# Patient Record
Sex: Female | Born: 1940 | Race: White | Hispanic: No | Smoking: Never smoker
Health system: Southern US, Community
[De-identification: ages and names within clinical notes are randomized; demographics above are authoritative.]

## PROBLEM LIST (undated history)

## (undated) DIAGNOSIS — Z5189 Encounter for other specified aftercare: Secondary | ICD-10-CM

## (undated) DIAGNOSIS — I10 Essential (primary) hypertension: Secondary | ICD-10-CM

## (undated) DIAGNOSIS — N390 Urinary tract infection, site not specified: Secondary | ICD-10-CM

## (undated) DIAGNOSIS — I509 Heart failure, unspecified: Secondary | ICD-10-CM

## (undated) DIAGNOSIS — E119 Type 2 diabetes mellitus without complications: Secondary | ICD-10-CM

## (undated) DIAGNOSIS — Z87442 Personal history of urinary calculi: Secondary | ICD-10-CM

## (undated) HISTORY — PX: TOE AMPUTATION: SHX809

## (undated) HISTORY — PX: EYE SURGERY: SHX253

---

## 2004-07-24 ENCOUNTER — Emergency Department (HOSPITAL_COMMUNITY): Admission: EM | Admit: 2004-07-24 | Discharge: 2004-07-24 | Payer: Self-pay | Admitting: Emergency Medicine

## 2012-01-15 ENCOUNTER — Encounter (HOSPITAL_COMMUNITY): Payer: Self-pay

## 2012-01-15 ENCOUNTER — Emergency Department (HOSPITAL_COMMUNITY): Payer: Medicare PPO

## 2012-01-15 ENCOUNTER — Inpatient Hospital Stay (HOSPITAL_COMMUNITY)
Admission: EM | Admit: 2012-01-15 | Discharge: 2012-01-17 | DRG: 669 | Disposition: A | Discharge status: Home / Self Care | Payer: Medicare PPO | Attending: Internal Medicine | Admitting: Internal Medicine

## 2012-01-15 ENCOUNTER — Inpatient Hospital Stay (HOSPITAL_COMMUNITY): Payer: Medicare PPO

## 2012-01-15 DIAGNOSIS — L97519 Non-pressure chronic ulcer of other part of right foot with unspecified severity: Secondary | ICD-10-CM

## 2012-01-15 DIAGNOSIS — N3289 Other specified disorders of bladder: Secondary | ICD-10-CM | POA: Diagnosis present

## 2012-01-15 DIAGNOSIS — R0602 Shortness of breath: Secondary | ICD-10-CM | POA: Diagnosis present

## 2012-01-15 DIAGNOSIS — E7800 Pure hypercholesterolemia, unspecified: Secondary | ICD-10-CM | POA: Diagnosis present

## 2012-01-15 DIAGNOSIS — N189 Chronic kidney disease, unspecified: Secondary | ICD-10-CM | POA: Diagnosis present

## 2012-01-15 DIAGNOSIS — E11621 Type 2 diabetes mellitus with foot ulcer: Secondary | ICD-10-CM | POA: Diagnosis present

## 2012-01-15 DIAGNOSIS — Z79899 Other long term (current) drug therapy: Secondary | ICD-10-CM

## 2012-01-15 DIAGNOSIS — L97509 Non-pressure chronic ulcer of other part of unspecified foot with unspecified severity: Secondary | ICD-10-CM | POA: Diagnosis present

## 2012-01-15 DIAGNOSIS — J811 Chronic pulmonary edema: Secondary | ICD-10-CM | POA: Diagnosis present

## 2012-01-15 DIAGNOSIS — N200 Calculus of kidney: Principal | ICD-10-CM | POA: Diagnosis present

## 2012-01-15 DIAGNOSIS — I10 Essential (primary) hypertension: Secondary | ICD-10-CM | POA: Diagnosis present

## 2012-01-15 DIAGNOSIS — I509 Heart failure, unspecified: Secondary | ICD-10-CM

## 2012-01-15 DIAGNOSIS — N1330 Unspecified hydronephrosis: Secondary | ICD-10-CM | POA: Diagnosis present

## 2012-01-15 DIAGNOSIS — E785 Hyperlipidemia, unspecified: Secondary | ICD-10-CM | POA: Diagnosis present

## 2012-01-15 DIAGNOSIS — E119 Type 2 diabetes mellitus without complications: Secondary | ICD-10-CM | POA: Diagnosis present

## 2012-01-15 DIAGNOSIS — N179 Acute kidney failure, unspecified: Secondary | ICD-10-CM | POA: Diagnosis present

## 2012-01-15 DIAGNOSIS — S98119A Complete traumatic amputation of unspecified great toe, initial encounter: Secondary | ICD-10-CM

## 2012-01-15 HISTORY — DX: Encounter for other specified aftercare: Z51.89

## 2012-01-15 HISTORY — DX: Type 2 diabetes mellitus without complications: E11.9

## 2012-01-15 HISTORY — DX: Essential (primary) hypertension: I10

## 2012-01-15 LAB — URINALYSIS, ROUTINE W REFLEX MICROSCOPIC
Leukocytes, UA: NEGATIVE
Nitrite: NEGATIVE
Protein, ur: 30 mg/dL — AB
Protein, ur: NEGATIVE mg/dL
Specific Gravity, Urine: 1.007 (ref 1.005–1.030)
Urobilinogen, UA: 0.2 mg/dL (ref 0.0–1.0)
Urobilinogen, UA: 0.2 mg/dL (ref 0.0–1.0)

## 2012-01-15 LAB — PRO B NATRIURETIC PEPTIDE: Pro B Natriuretic peptide (BNP): 6662 pg/mL — ABNORMAL HIGH (ref 0–125)

## 2012-01-15 LAB — URINE MICROSCOPIC-ADD ON

## 2012-01-15 LAB — TROPONIN I
Troponin I: <0.3 ng/mL (ref <0.30)
Troponin I: <0.3 ng/mL (ref <0.30)

## 2012-01-15 LAB — PROTIME-INR: INR: 1.25 (ref 0.00–1.49)

## 2012-01-15 LAB — CBC
Platelets: 97 10*3/uL — ABNORMAL LOW (ref 150–400)
RDW: 13.4 % (ref 11.5–15.5)
WBC: 18.3 10*3/uL — ABNORMAL HIGH (ref 4.0–10.5)

## 2012-01-15 LAB — COMPREHENSIVE METABOLIC PANEL
Calcium: 8.3 mg/dL — ABNORMAL LOW (ref 8.4–10.5)
Chloride: 100 mEq/L (ref 96–112)
GFR calc Af Amer: 22 mL/min — ABNORMAL LOW (ref >90)
GFR calc non Af Amer: 19 mL/min — ABNORMAL LOW (ref >90)
Potassium: 4.2 mEq/L (ref 3.5–5.1)
Sodium: 132 mEq/L — ABNORMAL LOW (ref 135–145)
Total Protein: 6 g/dL (ref 6.0–8.3)

## 2012-01-15 MED ORDER — OXYCODONE-ACETAMINOPHEN 5-325 MG PO TABS
1.0000 | ORAL_TABLET | Freq: Once | ORAL | Status: AC
  Administered 2012-01-15: 1 via ORAL
  Filled 2012-01-15: qty 1

## 2012-01-15 MED ORDER — CIPROFLOXACIN HCL 500 MG PO TABS
500.0000 mg | ORAL_TABLET | Freq: Two times a day (BID) | ORAL | Status: DC
Start: 2012-01-15 — End: 2012-01-17
  Administered 2012-01-15 – 2012-01-17 (×3): 500 mg via ORAL
  Filled 2012-01-15 (×6): qty 1

## 2012-01-15 MED ORDER — ALBUTEROL SULFATE (5 MG/ML) 0.5% IN NEBU: 2.5000 mg | INHALATION_SOLUTION | Freq: Four times a day (QID) | RESPIRATORY_TRACT | Status: DC | PRN

## 2012-01-15 MED ORDER — VERAPAMIL HCL ER 200 MG PO CP24
1.0000 | ORAL_CAPSULE | Freq: Every day | ORAL | Status: DC
  Administered 2012-01-15: 200 mg via ORAL
  Filled 2012-01-15 (×3): qty 1

## 2012-01-15 MED ORDER — ATORVASTATIN CALCIUM 40 MG PO TABS
40.0000 mg | ORAL_TABLET | Freq: Every day | ORAL | Status: DC
  Administered 2012-01-15 – 2012-01-17 (×2): 40 mg via ORAL
  Filled 2012-01-15 (×3): qty 1

## 2012-01-15 MED ORDER — INSULIN ASPART 100 UNIT/ML ~~LOC~~ SOLN
0.0000 [IU] | Freq: Three times a day (TID) | SUBCUTANEOUS | Status: DC
  Administered 2012-01-16: 2 [IU] via SUBCUTANEOUS
  Administered 2012-01-17: 1 [IU] via SUBCUTANEOUS

## 2012-01-15 MED ORDER — OXYCODONE-ACETAMINOPHEN 5-325 MG PO TABS: 1.0000 | ORAL_TABLET | ORAL | Status: DC | PRN

## 2012-01-15 MED ORDER — IPRATROPIUM BROMIDE 0.02 % IN SOLN: 0.5000 mg | Freq: Four times a day (QID) | RESPIRATORY_TRACT | Status: DC | PRN

## 2012-01-15 MED ORDER — ONDANSETRON HCL 4 MG/2ML IJ SOLN: 4.0000 mg | Freq: Four times a day (QID) | INTRAMUSCULAR | Status: DC | PRN

## 2012-01-15 MED ORDER — ACETAMINOPHEN 650 MG RE SUPP: 650.0000 mg | Freq: Four times a day (QID) | RECTAL | Status: DC | PRN

## 2012-01-15 MED ORDER — SODIUM CHLORIDE 0.9 % IV SOLN
INTRAVENOUS | Status: DC
  Administered 2012-01-16: 11:00:00 via INTRAVENOUS
  Administered 2012-01-16: 1000 mL via INTRAVENOUS

## 2012-01-15 MED ORDER — ASPIRIN EC 81 MG PO TBEC
81.0000 mg | DELAYED_RELEASE_TABLET | Freq: Every day | ORAL | Status: DC
  Administered 2012-01-15: 81 mg via ORAL
  Filled 2012-01-15 (×2): qty 1

## 2012-01-15 MED ORDER — FUROSEMIDE 10 MG/ML IJ SOLN
40.0000 mg | Freq: Once | INTRAMUSCULAR | Status: AC
  Administered 2012-01-15: 40 mg via INTRAVENOUS
  Filled 2012-01-15: qty 4

## 2012-01-15 MED ORDER — ACETAMINOPHEN 325 MG PO TABS: 650.0000 mg | ORAL_TABLET | Freq: Four times a day (QID) | ORAL | Status: DC | PRN

## 2012-01-15 MED ORDER — TAMSULOSIN HCL 0.4 MG PO CAPS
0.4000 mg | ORAL_CAPSULE | Freq: Every day | ORAL | Status: DC
  Administered 2012-01-15 – 2012-01-17 (×2): 0.4 mg via ORAL
  Filled 2012-01-15 (×3): qty 1

## 2012-01-15 MED ORDER — NITROGLYCERIN 2 % TD OINT
1.0000 [in_us] | TOPICAL_OINTMENT | Freq: Once | TRANSDERMAL | Status: AC
  Administered 2012-01-15: 1 [in_us] via TOPICAL
  Filled 2012-01-15: qty 1

## 2012-01-15 NOTE — H&P (Signed)
Internal Medicine Teaching Service Attending Note Date: 01/15/2012  Patient name: Ruth Douglas  Medical record number: [id number]  Date of birth: Jan 02, 1941   Chief Complaint: Pain in my belly, trouble breathing. Marland Kitchen History of Present Illness The patient, Ruth Douglas, is a 71 y.o. year old female who comes in with the chief complaint of pain abdomen and trouble breathing since the past 3-4 days. She has been treated at [facility] for kidney stone, given fluids and discharged on Cipro, however she did not feel better.  Past Medical History   has a past medical history of Renal disorder; Blood transfusion without reported diagnosis; Diabetes mellitus without complication; and Hypertension.  She has had kidney stone 30 years ago.  Medications  Reviewed  Family History family history is not on file.  Social History  reports that she has never smoked. She does not have any smokeless tobacco history on file. She reports that she does not drink alcohol or use illicit drugs.  Review of Systems Positive for - abdominal pain, fever (subjective), shortness of breath, chills, dysuria, frequency.  Negative for - chest pain, palpitations, nausea, vomiting.  Vital Signs: Filed Vitals:   01/15/12 2050  BP: 154/70  Pulse: 86  Temp: 98 F (36.7 C)  Resp: 20    Physical Exam: General:  Well-developed, well-nourished, in no acute distress; alert, appropriate and cooperative throughout examination.  HEENT: Normocephalic, atraumatic.   PERRLA, EOMI, No signs of anemia or jaundice.   Nares clear without discharge or hypertrophy of turbinates   Moist mucus membranes, Oropharynx nonerythematous, no exudate appreciated.   Neck: No deformities, masses, or tenderness noted.Supple, No carotid Bruits, no JVD.  Lungs:  Diffuse fine crackles present till mid lungs bilaterally.   Heart: RRR. S1 and S2 normal without gallop, murmur, or rubs appreciated  Abdomen:  BS normoactive. Soft,  Nondistended, mildly-tender LLQ .  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 Right foot with ulcer with some purulent discharge in plantar area directly under stump    Neurologic: A&O X3, CN II - XII are grossly intact. Motor strength is 5/5 in all extremities, Sensations intact to light touch.  Skin: No visible rashes or erythema   Lab results: CMP     Component Value Date/Time   NA 132* 01/15/2012 1129   K 4.2 01/15/2012 1129   CL 100 01/15/2012 1129   CO2 19 01/15/2012 1129   GLUCOSE 116* 01/15/2012 1129   BUN 57* 01/15/2012 1129   CREATININE 2.42* 01/15/2012 1129   CALCIUM 8.3* 01/15/2012 1129   PROT 6.0 01/15/2012 1129   ALBUMIN 2.3* 01/15/2012 1129   AST 35 01/15/2012 1129   ALT 23 01/15/2012 1129   ALKPHOS 112 01/15/2012 1129   BILITOT 0.4 01/15/2012 1129   GFRNONAA 19* 01/15/2012 1129   GFRAA 22* 01/15/2012 1129   CBC    Component Value Date/Time   WBC 18.3* 01/15/2012 1129   RBC 3.06* 01/15/2012 1129   HGB 8.3* 01/15/2012 1129   HCT 24.9* 01/15/2012 1129   PLT 97* 01/15/2012 1129   MCV 81.4 01/15/2012 1129   MCH 27.1 01/15/2012 1129   MCHC 33.3 01/15/2012 1129   RDW 13.4 01/15/2012 1129   Imaging results:  Ct Abdomen Pelvis Wo Contrast  01/15/2012  *RADIOLOGY REPORT*  Clinical Data: Left flank pain, kidney stone, dysuria  CT ABDOMEN AND PELVIS WITHOUT CONTRAST  Technique:  Multidetector CT imaging of the abdomen and pelvis was performed following  the standard protocol without intravenous contrast.  Comparison: [facility] CT abdomen pelvis dated 01/11/2012  Findings: Small left and trace right pleural effusions.  Mild hepatic steatosis.  Unenhanced spleen, pancreas, and adrenal glands are within normal limits.  Mildly distended gallbladder possible layering sludge (series 2/image 22).  No associated inflammatory changes by CT.  9 mm nonobstructing left lower pole renal calculus, unchanged.  Moderate left hydronephrosis, mildly increased, with associated perinephric/periureteral stranding.  Right kidney is notable for upper pole probable cysts (series 2/image 18).  No evidence of bowel obstruction.  Normal appendix.  Atherosclerotic calcifications of the abdominal aorta and branch vessels.  Small volume pelvic ascites.  Uterus and bilateral ovaries are unremarkable.  8 mm calculus in the distal left ureter (coronal image 60).  Nondependent gas in the bladder (series 2/image 70), likely related to recent intervention.  2.4 x 1.8 cm hypodense lesion in the left perineum (series 2/image 83), favored to reflect a Bartholin gland cyst.  Mild degenerative changes of the visualized thoracolumbar spine.  IMPRESSION: 8 mm distal left ureteral calculus with moderate left hydronephrosis, increased.  This likely corresponds to the UPJ calculus on recent prior CT.  Stable 9 mm left lower pole renal calculus.  Additional stable ancillary findings as above.   Original Report Authenticated By: Charline Bills, M.D.    Dg Chest 1 View  01/15/2012  *RADIOLOGY REPORT*  Clinical Data: Chest pain and shortness of breath.  CHEST - 1 VIEW  Comparison: No priors.  Findings: Lung volumes are normal.  No definite consolidative airspace disease.  Cephalization of the pulmonary vasculature, without frank pulmonary edema.  Heart size is borderline to mildly enlarged.  Atherosclerosis in the thoracic aorta.  IMPRESSION: 1.  Borderline to mildly enlarged cardiac silhouette with pulmonary venous congestion, but no frank pulmonary edema at this time. Findings may suggest some mild congestive heart failure.   Original Report Authenticated By: Trudie Reed, M.D.    Assessment and Plan:  60F with Left renal calculus with moderate left hydronephrosis, causing acute kidney injury and possible volume overload, not septic. Patient has been already seen by Urology consultant Dr Laverle Patter, and plan is to do lithotripsy tomorrow at  [facility]. Care would be taken over from there by Dr. Freida Busman. I agree with this plan.  Talked to Dr. Clyde Lundborg over the phone to discuss the plan.  For the night, I am in agreement with Dr. Garald Braver and Dr. Evorn Gong plan of care.  Thanks, Aletta Edouard, MD 11/17/201311:02 PM

## 2012-01-15 NOTE — Progress Notes (Signed)
71 yo woman recently hospitalized for kidney stone at [facility] has shortness of breath.  Lab workup shows elevated BNP, and chest x-ray shows pulmonary vascular congestion.  Plan to admit to treat for CHF.

## 2012-01-15 NOTE — ED Provider Notes (Signed)
History     CSN: [id number]  Arrival date & time 01/15/12  7829   First MD Initiated Contact with Patient 01/15/12 1036      Chief Complaint  Patient presents with  . Flank Pain    (Consider location/radiation/quality/duration/timing/severity/associated sxs/prior treatment) Patient is a 71 y.o. female presenting with flank pain and shortness of breath. The history is provided by the patient, a relative and the spouse. No language interpreter was used.  Flank Pain This is a recurrent problem. The current episode started in the past 7 days. The problem occurs constantly. The problem has been gradually worsening. Associated symptoms include abdominal pain. Pertinent negatives include no coughing, fever, headaches, nausea, vomiting or weakness.  Shortness of Breath  The current episode started 5 to 7 days ago. The onset was gradual. Associated symptoms include shortness of breath. Pertinent negatives include no fever, no cough and no wheezing.  71 yo female with c/o L flank pain and SOB.  Patient was released from [facility] hospital yesterday on home O2 with dx  kidney stone with pending lithotripsy in 3 days.  She is on cipro for ? Infection or prophylactically.  Family states that "the pain medication has made her face swell" Her urologist is Dr. Paulino Door in [place].  Her pcp is dr Remigio Eisenmenger in [place].  She is on cipro.   pmh of diabetes, hypertension, blood transfursion and kidney stone.    Past Medical History  Diagnosis Date  . Renal disorder   . Blood transfusion without reported diagnosis   . Diabetes mellitus without complication   . Hypertension     Past Surgical History  Procedure Date  . Toe amputation   . Cesarean section     No family history on file.  History  Substance Use Topics  . Smoking status: Never Smoker   . Smokeless tobacco: Not on file  . Alcohol Use: No    OB History    Grav Para Term Preterm Abortions TAB SAB Ect Mult Living                   Review of Systems  Constitutional: Negative.  Negative for fever.  HENT: Negative.   Eyes: Negative.   Respiratory: Positive for shortness of breath. Negative for cough, chest tightness and wheezing.   Cardiovascular: Negative.   Gastrointestinal: Positive for abdominal pain. Negative for nausea, vomiting, diarrhea, constipation, blood in stool and abdominal distention.  Genitourinary: Positive for flank pain. Negative for dysuria, urgency, frequency, hematuria, vaginal bleeding, vaginal discharge and difficulty urinating.  Musculoskeletal: Negative.   Skin: Negative.   Neurological: Negative.  Negative for dizziness, weakness, light-headedness and headaches.  Psychiatric/Behavioral: Negative.   All other systems reviewed and are negative.    Allergies  Review of patient's allergies indicates no known allergies.  Home Medications   Current Outpatient Rx  Name  Route  Sig  Dispense  Refill  . ATORVASTATIN CALCIUM 40 MG PO TABS   Oral   Take 40 mg by mouth daily.         Marland Kitchen CIPROFLOXACIN HCL 500 MG PO TABS   Oral   Take 500 mg by mouth 2 (two) times daily.         Marland Kitchen METFORMIN HCL 1000 MG PO TABS   Oral   Take 1,000 mg by mouth 2 (two) times daily with a meal.         . OXYCODONE-ACETAMINOPHEN 5-325 MG PO TABS   Oral   Take 1-2  tablets by mouth every 4 (four) hours as needed. For pain         . VERAPAMIL HCL ER 200 MG PO CP24   Oral   Take 1 capsule by mouth daily.           BP 161/62  Pulse 86  Temp 98.3 F (36.8 C) (Oral)  Resp 20  SpO2 88%  Physical Exam  Nursing note and vitals reviewed. Constitutional: She is oriented to person, place, and time. She appears well-developed and well-nourished.  HENT:  Head: Normocephalic and atraumatic.  Eyes: Conjunctivae normal and EOM are normal. Pupils are equal, round, and reactive to light.  Neck: Normal range of motion. Neck supple.  Cardiovascular: Normal rate.   Pulmonary/Chest: Effort normal. She  has rales.  Abdominal: Soft. Bowel sounds are normal. She exhibits no distension and no mass. There is tenderness. There is no rebound and no guarding.       LUQ abdomen  Genitourinary:       No cva tenderness  Musculoskeletal: Normal range of motion. She exhibits no edema and no tenderness.  Neurological: She is alert and oriented to person, place, and time. She has normal reflexes.  Skin: Skin is warm and dry.  Psychiatric: She has a normal mood and affect.    ED Course  Procedures (including critical care time)   Labs Reviewed  URINALYSIS, ROUTINE W REFLEX MICROSCOPIC  CBC  COMPREHENSIVE METABOLIC PANEL  PRO B NATRIURETIC PEPTIDE   Ct Abdomen Pelvis Wo Contrast  01/15/2012  *RADIOLOGY REPORT*  Clinical Data: Left flank pain, kidney stone, dysuria  CT ABDOMEN AND PELVIS WITHOUT CONTRAST  Technique:  Multidetector CT imaging of the abdomen and pelvis was performed following the standard protocol without intravenous contrast.  Comparison: [facility] CT abdomen pelvis dated 01/11/2012  Findings: Small left and trace right pleural effusions.  Mild hepatic steatosis.  Unenhanced spleen, pancreas, and adrenal glands are within normal limits.  Mildly distended gallbladder possible layering sludge (series 2/image 22).  No associated inflammatory changes by CT.  9 mm nonobstructing left lower pole renal calculus, unchanged. Moderate left hydronephrosis, mildly increased, with associated perinephric/periureteral stranding.  Right kidney is notable for upper pole probable cysts (series 2/image 18).  No evidence of bowel obstruction.  Normal appendix.  Atherosclerotic calcifications of the abdominal aorta and branch vessels.  Small volume pelvic ascites.  Uterus and bilateral ovaries are unremarkable.  8 mm calculus in the distal left ureter (coronal image 60).  Nondependent gas in the bladder (series 2/image 70), likely related to recent intervention.  2.4 x 1.8 cm hypodense lesion in the left perineum  (series 2/image 83), favored to reflect a Bartholin gland cyst.  Mild degenerative changes of the visualized thoracolumbar spine.  IMPRESSION: 8 mm distal left ureteral calculus with moderate left hydronephrosis, increased.  This likely corresponds to the UPJ calculus on recent prior CT.  Stable 9 mm left lower pole renal calculus.  Additional stable ancillary findings as above.   Original Report Authenticated By: Charline Bills, M.D.    Dg Chest 1 View  01/15/2012  *RADIOLOGY REPORT*  Clinical Data: Chest pain and shortness of breath.  CHEST - 1 VIEW  Comparison: No priors.  Findings: Lung volumes are normal.  No definite consolidative airspace disease.  Cephalization of the pulmonary vasculature, without frank pulmonary edema.  Heart size is borderline to mildly enlarged.  Atherosclerosis in the thoracic aorta.  IMPRESSION: 1.  Borderline to mildly enlarged cardiac silhouette with pulmonary venous congestion,  but no frank pulmonary edema at this time. Findings may suggest some mild congestive heart failure.   Original Report Authenticated By: Trudie Reed, M.D.      No diagnosis found.    MDM  71 yo female with CHF bnp [id number].  DC from [facility] hospital yesterday with home O2 and kidney stones on cipro.  Records pending from [facility] being faxed.  Today CT shows 9mm stone in  Lower pole renal calculus with moderate l hydronephrosis.  8mm stone  in distal L ureter as well.  Patient will be admitted to IM unassigned for hypoxia, chf and kidney stones.  CT and chest reviewed by myself.  Shared visit with Dr Ignacia Palma.  Labs Reviewed  CBC - Abnormal; Notable for the following:    WBC 18.3 (*)     RBC 3.06 (*)     Hemoglobin 8.3 (*)     HCT 24.9 (*)     Platelets 97 (*)  PLATELET COUNT CONFIRMED BY SMEAR   All other components within normal limits  COMPREHENSIVE METABOLIC PANEL - Abnormal; Notable for the following:    Sodium 132 (*)     Glucose, Bld 116 (*)     BUN 57 (*)     Creatinine,  Ser 2.42 (*)     Calcium 8.3 (*)     Albumin 2.3 (*)     GFR calc non Af Amer 19 (*)     GFR calc Af Amer 22 (*)     All other components within normal limits  PRO B NATRIURETIC PEPTIDE - Abnormal; Notable for the following:    Pro B Natriuretic peptide (BNP) 6662.0 (*)     All other components within normal limits  URINALYSIS, ROUTINE W REFLEX MICROSCOPIC  URINALYSIS, ROUTINE W REFLEX MICROSCOPIC         Remi Haggard, NP 01/15/12 1323

## 2012-01-15 NOTE — Progress Notes (Signed)
Contacted by resident service, 71 yo F with obstructing renal calculi felt to be causing AKI, fluid overload, being transferred to [facility] at the request of urology who evaluated patient already for planned lithotripsy procedure to be performed tomorrow.  Have accepted patient and notified our flow manager of the pending transfer.

## 2012-01-15 NOTE — Progress Notes (Signed)
Patient ID: Ruth Douglas, female   DOB: Jun 29, 1940, 72 y.o.   MRN: [id number]  Urology Consult   Physician requesting consult: Dr. Dalphine Handing  Reason for consult: Ureteral calculus  History of Present Illness: Ruth Douglas is a 71 y.o. who was in the ED at [facility] Wednesday and was found to have a left ureteral calculus. She was admitted to the hospital in [place] and stayed there until yesterday when she was discharged.  She does have a history of kidney stones and her primary urologist is Dr. Jerre Simon in [place] and she was originally scheduled to undergo SWL on Wednesday of this week.  She presented to the [facility] ED today with complaints of worsening left flank pain that she describes as severe and significant shortness of breath.  She has been admitted by Internal Medicine for further treatment and evaluation of her pulmonary situation.  In addition, she has been noted to have a Cr of 2.4.  A repeat CT was performed today and demonstrated a 6.5 mm left distal ureteral calculus.  She also has a 9 mm non-obstructing left lower pole renal calculus. She has had nausea and vomiting.  She denies fever.   Past Medical History  Diagnosis Date      . Hypercholesterolemia   . Diabetes mellitus without complication   . Hypertension     Past Surgical History  Procedure Date  . Toe amputation   . Cesarean section      Current Hospital Medications: Scheduled Meds:   . [COMPLETED] furosemide  40 mg Intravenous Once  . [COMPLETED] nitroGLYCERIN  1 inch Topical Once  . [COMPLETED] oxyCODONE-acetaminophen  1 tablet Oral Once   Continuous Infusions:  PRN Meds:.    Medication List     As of 01/15/2012  5:30 PM            atorvastatin 40 MG tablet   Commonly known as: LIPITOR   Take 40 mg by mouth daily.                  metFORMIN 1000 MG tablet   Commonly known as: GLUCOPHAGE   Take 1,000 mg by mouth 2 (two) times daily with a meal.                  Verapamil HCl  CR 200 MG Cp24   Take 1 capsule by mouth daily.          Allergies: No Known Allergies  No family history on file.  Social History:  reports that she has never smoked. She does not have any smokeless tobacco history on file. She reports that she does not drink alcohol or use illicit drugs.  ROS: A complete review of systems was performed.  All systems are negative except for pertinent findings as noted.  Physical Exam:  Vital signs in last 24 hours: Temp:  [98.3 F (36.8 C)] 98.3 F (36.8 C) (11/17 1003) Pulse Rate:  [81-87] 87  (11/17 1430) Resp:  [18-20] 20  (11/17 1430) BP: (155-167)/(62-74) 155/68 mmHg (11/17 1430) SpO2:  [88 %-100 %] 100 % (11/17 1430) FiO2 (%):  [2 %] 2 % (11/17 1215) General:  Alert and oriented, No acute distress HEENT: Normocephalic, atraumatic Neck: No JVD or lymphadenopathy Cardiovascular: Regular rate and rhythm Lungs: Clear bilaterally Abdomen: Soft, nontender, nondistended, no abdominal masses Back: No CVA tenderness Extremities: No edema Neurologic: Grossly intact  Laboratory Data:   Basename 01/15/12 1129  WBC 18.3*  HGB 8.3*  HCT 24.9*  PLT 97*     Basename 01/15/12 1129  NA 132*  K 4.2  CL 100  GLUCOSE 116*  BUN 57*  CALCIUM 8.3*  CREATININE 2.42*     Results for orders placed during the hospital encounter of 01/15/12 (from the past 24 hour(s))  CBC     Status: Abnormal   Collection Time   01/15/12 11:29 AM      Component Value Range   WBC 18.3 (*) 4.0 - 10.5 K/uL   RBC 3.06 (*) 3.87 - 5.11 MIL/uL   Hemoglobin 8.3 (*) 12.0 - 15.0 g/dL   HCT 36.6 (*) 44.0 - 34.7 %   MCV 81.4  78.0 - 100.0 fL   MCH 27.1  26.0 - 34.0 pg   MCHC 33.3  30.0 - 36.0 g/dL   RDW 42.5  95.6 - 38.7 %   Platelets 97 (*) 150 - 400 K/uL  COMPREHENSIVE METABOLIC PANEL     Status: Abnormal   Collection Time   01/15/12 11:29 AM      Component Value Range   Sodium 132 (*) 135 - 145 mEq/L   Potassium 4.2  3.5 - 5.1 mEq/L   Chloride 100  96 -  112 mEq/L   CO2 19  19 - 32 mEq/L   Glucose, Bld 116 (*) 70 - 99 mg/dL   BUN 57 (*) 6 - 23 mg/dL   Creatinine, Ser 5.64 (*) 0.50 - 1.10 mg/dL   Calcium 8.3 (*) 8.4 - 10.5 mg/dL   Total Protein 6.0  6.0 - 8.3 g/dL   Albumin 2.3 (*) 3.5 - 5.2 g/dL   AST 35  0 - 37 U/L   ALT 23  0 - 35 U/L   Alkaline Phosphatase 112  39 - 117 U/L   Total Bilirubin 0.4  0.3 - 1.2 mg/dL   GFR calc non Af Amer 19 (*) >90 mL/min   GFR calc Af Amer 22 (*) >90 mL/min  PRO B NATRIURETIC PEPTIDE     Status: Abnormal   Collection Time   01/15/12 11:42 AM      Component Value Range   Pro B Natriuretic peptide (BNP) 6662.0 (*) 0 - 125 pg/mL  URINALYSIS, ROUTINE W REFLEX MICROSCOPIC     Status: Abnormal   Collection Time   01/15/12  1:22 PM      Component Value Range   Color, Urine YELLOW  YELLOW   APPearance CLEAR  CLEAR   Specific Gravity, Urine 1.010  1.005 - 1.030   pH 5.5  5.0 - 8.0   Glucose, UA NEGATIVE  NEGATIVE mg/dL   Hgb urine dipstick LARGE (*) NEGATIVE   Bilirubin Urine NEGATIVE  NEGATIVE   Ketones, ur NEGATIVE  NEGATIVE mg/dL   Protein, ur 30 (*) NEGATIVE mg/dL   Urobilinogen, UA 0.2  0.0 - 1.0 mg/dL   Nitrite NEGATIVE  NEGATIVE   Leukocytes, UA NEGATIVE  NEGATIVE  URINE MICROSCOPIC-ADD ON     Status: Abnormal   Collection Time   01/15/12  1:22 PM      Component Value Range   Squamous Epithelial / LPF FEW (*) RARE   WBC, UA 11-20  <3 WBC/hpf   RBC / HPF 7-10  <3 RBC/hpf   No results found for this or any previous visit (from the past 240 hour(s)).  Renal Function:  Basename 01/15/12 1129  CREATININE 2.42*   CrCl is unknown because there is no height on file for the current visit.  Radiologic Imaging: Ct Abdomen  Pelvis Wo Contrast  01/15/2012  *RADIOLOGY REPORT*  Clinical Data: Left flank pain, kidney stone, dysuria  CT ABDOMEN AND PELVIS WITHOUT CONTRAST  Technique:  Multidetector CT imaging of the abdomen and pelvis was performed following the standard protocol without  intravenous contrast.  Comparison: [facility] CT abdomen pelvis dated 01/11/2012  Findings: Small left and trace right pleural effusions.  Mild hepatic steatosis.  Unenhanced spleen, pancreas, and adrenal glands are within normal limits.  Mildly distended gallbladder possible layering sludge (series 2/image 22).  No associated inflammatory changes by CT.  9 mm nonobstructing left lower pole renal calculus, unchanged. Moderate left hydronephrosis, mildly increased, with associated perinephric/periureteral stranding.  Right kidney is notable for upper pole probable cysts (series 2/image 18).  No evidence of bowel obstruction.  Normal appendix.  Atherosclerotic calcifications of the abdominal aorta and branch vessels.  Small volume pelvic ascites.  Uterus and bilateral ovaries are unremarkable.  8 mm calculus in the distal left ureter (coronal image 60).  Nondependent gas in the bladder (series 2/image 70), likely related to recent intervention.  2.4 x 1.8 cm hypodense lesion in the left perineum (series 2/image 83), favored to reflect a Bartholin gland cyst.  Mild degenerative changes of the visualized thoracolumbar spine.  IMPRESSION: 8 mm distal left ureteral calculus with moderate left hydronephrosis, increased.  This likely corresponds to the UPJ calculus on recent prior CT.  Stable 9 mm left lower pole renal calculus.  Additional stable ancillary findings as above.   Original Report Authenticated By: Charline Bills, M.D.    Dg Chest 1 View  01/15/2012  *RADIOLOGY REPORT*  Clinical Data: Chest pain and shortness of breath.  CHEST - 1 VIEW  Comparison: No priors.  Findings: Lung volumes are normal.  No definite consolidative airspace disease.  Cephalization of the pulmonary vasculature, without frank pulmonary edema.  Heart size is borderline to mildly enlarged.  Atherosclerosis in the thoracic aorta.  IMPRESSION: 1.  Borderline to mildly enlarged cardiac silhouette with pulmonary venous congestion, but no  frank pulmonary edema at this time. Findings may suggest some mild congestive heart failure.   Original Report Authenticated By: Trudie Reed, M.D.      I independently reviewed the above imaging studies.  Impression/Assessment:  Left ureteral calculus with acute kidney injury  Plan:  1) Strain urine and administer IV pain meds. Alpha blocker therapy for medical expulsion therapy. NPO after midnight. 2) Plan for OR tomorrow considering persistent pain and elevated Cr (likely related to dehydration but may have a component of CKD with history of diabetes).  I recommended proceeding left ureteroscopic laser lithotripsy and ureteral stent placement. I discussed the potential benefits and risks of the procedure, side effects of the proposed treatment, the likelihood of the patient achieving the goals of the procedure, and any potential problems that might occur during the procedure or recuperation.  3) Please call me if she passes her stone.  She will be added to OR schedule tomorrow at [facility] and will be arranged to be carelinked for procedure. 4) Monitor renal function.    Maryfrances Portugal,LES 01/15/2012, 5:05 PM  Moody Bruins. MD

## 2012-01-15 NOTE — ED Notes (Signed)
Patient transferred to CDU report called to [facility]

## 2012-01-15 NOTE — ED Notes (Signed)
Pt was seen at [facility] from [place] and d/c yesterday for obstructing kidney stone.  Was set to have lithotripsy this Wed.  Pt with generalized edema starting last night.

## 2012-01-15 NOTE — H&P (Signed)
Hospital Admission Note Date: 01/15/2012  Patient name: Ruth Douglas Medical record number: [id number] Date of birth: [birth year] Age: 71 y.o. Gender: female PCP: No primary provider on file.  Medical Service: Internal Medicine Teaching Service  Attending physician:  Dr. Michael Boston    1st Contact: Dr. Garald Braver   Pager:[phone] 2nd Contact: Dr. Clyde Lundborg    Pager:[phone] After 5 pm or weekends: 1st Contact:      Pager: [phone] 2nd Contact:      Pager: [phone]  Chief Complaint: Left flank pain, shortness of breath, dysuria   History of Present Illness: Ruth Douglas is a 71 year old woman with PMH of HTN, DM2, and hyperlipidemia who comes in to the [facility] ED for further evaluation of left kidney stone and new onset shortness of breath. She states that on Wednesday she had severe left flank pain, was seen at the [facility] and found to have a left ureteral calculus. She was admitted there, given IVF, and was discharged yesterday with Cipro 500mg  BID, oxycodone for pain, and home O2. Today she describes feeling increasedly short of breath with "puffiness: over her arms and face. She also complains of a burning sensation with urination for 3-4 days now.    She denies fever but has had nausea, vomiting, chills, and malaise.   She lives in [place], [place] but often seeks care in [place], [place]. Her PCP is Dr. Farris Has, in [place]. She is a former Producer, television/film/video. She has never had PFTs done and denies shortness of breath until recently. She never smoked.   Meds: No current facility-administered medications on file prior to encounter.   Current Outpatient Prescriptions on File Prior to Encounter  Medication Sig Dispense Refill  . atorvastatin (LIPITOR) 40 MG tablet Take 40 mg by mouth daily.      . Verapamil HCl CR 200 MG CP24 Take 1 capsule by mouth daily.       Allergies: Allergies as of 01/15/2012  . (No Known Allergies)   Past Medical History  Diagnosis Date  . Renal disorder   . Blood  transfusion without reported diagnosis   . Diabetes mellitus without complication   . Hypertension    Past Surgical History  Procedure Date  . Toe amputation   . Cesarean section    History reviewed. No pertinent family history. History   Social History  . Marital Status: Married    Spouse Name: N/A    Number of Children: N/A  . Years of Education: N/A   Occupational History  . Not on file.   Social History Main Topics  . Smoking status: Never Smoker   . Smokeless tobacco: Not on file  . Alcohol Use: No  . Drug Use: No  . Sexually Active:    Other Topics Concern  . Not on file   Social History Narrative  . No narrative on file    Review of Systems: Pertinent items are noted in HPI.  Physical Exam: Blood pressure 159/69, pulse 86, temperature 98.4 F (36.9 C), temperature source Oral, resp. rate 20, height 5\' 3"  (1.6 m), weight 165 lb 12.6 oz (75.2 kg), SpO2 98.00%. BP 159/69  Pulse 86  Temp 98.4 F (36.9 C) (Oral)  Resp 20  Ht 5\' 3"  (1.6 m)  Wt 165 lb 12.6 oz (75.2 kg)  BMI 29.37 kg/m2  SpO2 98%  General Appearance:    Alert, cooperative, no distress, appears stated age  Head:    Normocephalic, without obvious abnormality, atraumatic  Eyes:  PERRL, conjunctiva/corneas clear, EOM's intact,      Nose:   Nares normal, septum midline, mucosa normal, no drainage     Throat:   Lips, mucosa, and tongue normal;  Neck:   Supple, symmetrical, trachea midline, no adenopathy;      Back:     Symmetric, no curvature, ROM normal, no CVA tenderness  Lungs:     Bibasilar crackles, respirations unlabored  Chest Wall:    No tenderness or deformity   Heart:    Regular rate and rhythm, S1 and S2 normal, no murmur, rub   or gallop     Abdomen:     Tenderness in LLQ, soft, bowel sounds active all four quadrants, no masses, no organomegaly        Extremities:   Extremities normal, atraumatic, no cyanosis trace pedal edema. 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  Right foot with ulcer with minimum purulent discharge in plantar area directly beneath the 1st metatarsal stump  Pulses:   2+ and symmetric all extremities  Skin:   Skin color, texture, turgor normal, no rashes or lesions     Neurologic:   CNII-XII intact, normal strength, sensation and reflexes    throughout    Lab results: Basic Metabolic Panel:  Basename 01/15/12 1129  NA 132*  K 4.2  CL 100  CO2 19  GLUCOSE 116*  BUN 57*  CREATININE 2.42*  CALCIUM 8.3*  MG --  PHOS --   Liver Function Tests:  Basename 01/15/12 1129  AST 35  ALT 23  ALKPHOS 112  BILITOT 0.4  PROT 6.0  ALBUMIN 2.3*   CBC:  Basename 01/15/12 1129  WBC 18.3*  NEUTROABS --  HGB 8.3*  HCT 24.9*  MCV 81.4  PLT 97*   Cardiac Enzymes:  Basename 01/15/12 1548  CKTOTAL --  CKMB --  CKMBINDEX --  TROPONINI <0.30   BNP:  Basename 01/15/12 1142  PROBNP 6662.0*   CBG:  Basename 01/15/12 1655  GLUCAP 100*     Urinalysis:  Basename 01/15/12 1322  COLORURINE YELLOW  LABSPEC 1.010  PHURINE 5.5  GLUCOSEU NEGATIVE  HGBUR LARGE*  BILIRUBINUR NEGATIVE  KETONESUR NEGATIVE  PROTEINUR 30*  UROBILINOGEN 0.2  NITRITE NEGATIVE  LEUKOCYTESUR NEGATIVE    Imaging results:  Ct Abdomen Pelvis Wo Contrast  01/15/2012  *RADIOLOGY REPORT*  Clinical Data: Left flank pain, kidney stone, dysuria  CT ABDOMEN AND PELVIS WITHOUT CONTRAST  Technique:  Multidetector CT imaging of the abdomen and pelvis was performed following the standard protocol without intravenous contrast.  Comparison: [place] CT abdomen pelvis dated 01/11/2012  Findings: Small left and trace right pleural effusions.  Mild hepatic steatosis.  Unenhanced spleen, pancreas, and adrenal glands are within normal limits.  Mildly distended gallbladder possible layering sludge (series 2/image 22).  No associated inflammatory changes by CT.  9 mm nonobstructing left lower pole renal calculus, unchanged. Moderate left hydronephrosis, mildly  increased, with associated perinephric/periureteral stranding.  Right kidney is notable for upper pole probable cysts (series 2/image 18).  No evidence of bowel obstruction.  Normal appendix.  Atherosclerotic calcifications of the abdominal aorta and branch vessels.  Small volume pelvic ascites.  Uterus and bilateral ovaries are unremarkable.  8 mm calculus in the distal left ureter (coronal image 60).  Nondependent gas in the bladder (series 2/image 70), likely related to recent intervention.  2.4 x 1.8 cm hypodense lesion in the left perineum (series 2/image 83), favored to reflect a Bartholin gland cyst.  Mild degenerative changes  of the visualized thoracolumbar spine.  IMPRESSION: 8 mm distal left ureteral calculus with moderate left hydronephrosis, increased.  This likely corresponds to the UPJ calculus on recent prior CT.  Stable 9 mm left lower pole renal calculus.  Additional stable ancillary findings as above.   Original Report Authenticated By: Charline Bills, M.D.    Dg Chest 1 View  01/15/2012  *RADIOLOGY REPORT*  Clinical Data: Chest pain and shortness of breath.  CHEST - 1 VIEW  Comparison: No priors.  Findings: Lung volumes are normal.  No definite consolidative airspace disease.  Cephalization of the pulmonary vasculature, without frank pulmonary edema.  Heart size is borderline to mildly enlarged.  Atherosclerosis in the thoracic aorta.  IMPRESSION: 1.  Borderline to mildly enlarged cardiac silhouette with pulmonary venous congestion, but no frank pulmonary edema at this time. Findings may suggest some mild congestive heart failure.   Original Report Authenticated By: Trudie Reed, M.D.     Other results: EKG: Normal sinus rhythm, no axis deviation, no ST changes  Assessment & Plan by Problem: 71 year old lady with DM2, HTN, now with Left renal calculus, AKI, and right foot ulcer  Left Renal Calculus with acute kidney injury: Her baseline creatinine is unknown but it is certainly  elevated on admission to 2.42. This is most likely explained by her renal calculi. Her CT abdomen shows 8 mm distal left ureteral calculus with moderate left hydronephrosis, increased (likely corresponds to the UPJ calculus on recent prior CT) and stable 9 mm left lower pole renal calculus. She is currently not septic, however, with no tachycardia, tachpenia, or fever. She does have leukocytosis of 18.3, however. Urology has been consulted. Per Dr. Laverle Patter patient will likely undergo left ureteroscopic laser lithotripsy and ureteral stent placement.  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 She is currently saturating at 98% on 2L.  -Continue O2 PRN, wean off as tolerated -DuoNebs q 6hr PRN for SOB/wheezing.  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 Her wound looks suspicious for osteomyelitis. Given her leukocytosis and malaise, osteomyelitis of the right foot is also a possible active problem to consider.  -Right foot Xray ordered, will f/u - Will call Orthopedic surgery pending results of plain film  DM2. She states that she last saw her PCP, Dr. Farris Has, in January this year. She takes metformin BID at home and assures compliance. She also takes aspirin 81 mg daily. -Will check Hgb A1C -Hold metformin in setting of AKI -SSI (sensitive) -Continue ASA  HTN: On verapamil 200mg  daily at home. Will continue this medication.   Hyperlipidemia. On  Atorvastatin 40mg  daily at home. Will continue this  medication. Will check lipid panel.   DVT prophylaxis: SCDs   Dispo: Disposition is deferred at this time, awaiting improvement of current medical problems. Anticipated discharge in approximately 2-3day(s).   The patient does have current PCP (Dr. Farris Has), therefore will not be require OPC follow-up after discharge.   The patient does not have transportation  limitations that hinder transportation to clinic appointments.  SignedKy Barban 01/15/2012, 7:34 PM

## 2012-01-15 NOTE — ED Provider Notes (Signed)
Date: 01/15/2012  Rate: 85  Rhythm: normal sinus rhythm  QRS Axis: normal  Intervals: normal  ST/T Wave abnormalities: normal  Conduction Disutrbances:none  Narrative Interpretation: Normal EKG  Old EKG Reviewed: none available    Carleene Cooper III, MD 01/15/12 1544

## 2012-01-16 ENCOUNTER — Inpatient Hospital Stay: Admit: 2012-01-16 | Payer: Self-pay | Admitting: Urology

## 2012-01-16 ENCOUNTER — Encounter (HOSPITAL_COMMUNITY): Payer: Self-pay | Admitting: *Deleted

## 2012-01-16 ENCOUNTER — Inpatient Hospital Stay (HOSPITAL_COMMUNITY): Payer: Medicare PPO | Admitting: *Deleted

## 2012-01-16 ENCOUNTER — Encounter (HOSPITAL_COMMUNITY)
Admission: EM | Disposition: A | Discharge status: Home / Self Care | Payer: Self-pay | Source: Home / Self Care | Attending: Internal Medicine

## 2012-01-16 DIAGNOSIS — R0602 Shortness of breath: Secondary | ICD-10-CM

## 2012-01-16 DIAGNOSIS — E119 Type 2 diabetes mellitus without complications: Secondary | ICD-10-CM

## 2012-01-16 DIAGNOSIS — N179 Acute kidney failure, unspecified: Secondary | ICD-10-CM

## 2012-01-16 DIAGNOSIS — I509 Heart failure, unspecified: Secondary | ICD-10-CM

## 2012-01-16 HISTORY — PX: CYSTOSCOPY WITH STENT PLACEMENT: SHX5790

## 2012-01-16 HISTORY — PX: HOLMIUM LASER APPLICATION: SHX5852

## 2012-01-16 LAB — BASIC METABOLIC PANEL
BUN: 60 mg/dL — ABNORMAL HIGH (ref 6–23)
Chloride: 99 mEq/L (ref 96–112)
Creatinine, Ser: 2.34 mg/dL — ABNORMAL HIGH (ref 0.50–1.10)
GFR calc Af Amer: 23 mL/min — ABNORMAL LOW (ref >90)
GFR calc non Af Amer: 20 mL/min — ABNORMAL LOW (ref >90)
Glucose, Bld: 100 mg/dL — ABNORMAL HIGH (ref 70–99)

## 2012-01-16 LAB — LIPID PANEL
HDL: 20 mg/dL — ABNORMAL LOW (ref >39)
LDL Cholesterol: 78 mg/dL (ref 0–99)
Total CHOL/HDL Ratio: 6.3 RATIO

## 2012-01-16 LAB — GLUCOSE, CAPILLARY
Glucose-Capillary: 109 mg/dL — ABNORMAL HIGH (ref 70–99)
Glucose-Capillary: 95 mg/dL (ref 70–99)

## 2012-01-16 LAB — CBC
HCT: 23.8 % — ABNORMAL LOW (ref 36.0–46.0)
Hemoglobin: 8 g/dL — ABNORMAL LOW (ref 12.0–15.0)
MCH: 27.4 pg (ref 26.0–34.0)
MCHC: 33.6 g/dL (ref 30.0–36.0)
RDW: 13.4 % (ref 11.5–15.5)

## 2012-01-16 LAB — PROTIME-INR: INR: 1.32 (ref 0.00–1.49)

## 2012-01-16 LAB — HEMOGLOBIN A1C: Hgb A1c MFr Bld: 7.6 % — ABNORMAL HIGH (ref <5.7)

## 2012-01-16 SURGERY — CYSTOSCOPY, WITH STENT INSERTION
Anesthesia: General | Laterality: Left | Wound class: Clean Contaminated

## 2012-01-16 MED ORDER — SODIUM CHLORIDE 0.9 % IR SOLN
Status: DC | PRN
  Administered 2012-01-16: 4000 mL via INTRAVESICAL

## 2012-01-16 MED ORDER — CIPROFLOXACIN IN D5W 200 MG/100ML IV SOLN
200.0000 mg | INTRAVENOUS | Status: DC
  Filled 2012-01-16: qty 100

## 2012-01-16 MED ORDER — MEPERIDINE HCL 50 MG/ML IJ SOLN: 6.2500 mg | INTRAMUSCULAR | Status: DC | PRN

## 2012-01-16 MED ORDER — PROPOFOL 10 MG/ML IV EMUL
INTRAVENOUS | Status: DC | PRN
  Administered 2012-01-16: 200 mg via INTRAVENOUS

## 2012-01-16 MED ORDER — HYDROMORPHONE HCL PF 1 MG/ML IJ SOLN: 0.2500 mg | INTRAMUSCULAR | Status: DC | PRN

## 2012-01-16 MED ORDER — IOHEXOL 300 MG/ML  SOLN
INTRAMUSCULAR | Status: AC
  Filled 2012-01-16: qty 1

## 2012-01-16 MED ORDER — OXYCODONE HCL 5 MG/5ML PO SOLN: 5.0000 mg | Freq: Once | ORAL | Status: AC | PRN

## 2012-01-16 MED ORDER — STERILE WATER FOR IRRIGATION IR SOLN
Status: DC | PRN
  Administered 2012-01-16: 1000 mL via INTRAVESICAL

## 2012-01-16 MED ORDER — LIDOCAINE HCL (CARDIAC) 20 MG/ML IV SOLN
INTRAVENOUS | Status: DC | PRN
  Administered 2012-01-16: 75 mg via INTRAVENOUS

## 2012-01-16 MED ORDER — IOHEXOL 300 MG/ML  SOLN
INTRAMUSCULAR | Status: DC | PRN
  Administered 2012-01-16: 10 mL

## 2012-01-16 MED ORDER — ACETAMINOPHEN 10 MG/ML IV SOLN
1000.0000 mg | Freq: Once | INTRAVENOUS | Status: AC | PRN
  Filled 2012-01-16: qty 100

## 2012-01-16 MED ORDER — PROMETHAZINE HCL 25 MG/ML IJ SOLN: 6.2500 mg | INTRAMUSCULAR | Status: DC | PRN

## 2012-01-16 MED ORDER — OXYCODONE HCL 5 MG PO TABS: 5.0000 mg | ORAL_TABLET | Freq: Once | ORAL | Status: AC | PRN

## 2012-01-16 MED ORDER — ONDANSETRON HCL 4 MG/2ML IJ SOLN
INTRAMUSCULAR | Status: DC | PRN
  Administered 2012-01-16 (×2): 2 mg via INTRAVENOUS

## 2012-01-16 MED ORDER — CIPROFLOXACIN IN D5W 400 MG/200ML IV SOLN
INTRAVENOUS | Status: DC | PRN
  Administered 2012-01-16: 200 mg via INTRAVENOUS

## 2012-01-16 MED ORDER — EPHEDRINE SULFATE 50 MG/ML IJ SOLN
INTRAMUSCULAR | Status: DC | PRN
  Administered 2012-01-16: 2.5 mg via INTRAVENOUS

## 2012-01-16 SURGICAL SUPPLY — 21 items
ADAPTER CATH URET PLST 4-6FR (CATHETERS) ×2 IMPLANT
ADPR CATH URET STRL DISP 4-6FR (CATHETERS) ×1
BAG URO CATCHER STRL LF (DRAPE) ×2 IMPLANT
BASKET ZERO TIP NITINOL 2.4FR (BASKET) ×1 IMPLANT
BSKT STON RTRVL ZERO TP 2.4FR (BASKET) ×1
CATH CLEAR GEL 3F BACKSTOP (CATHETERS) ×1 IMPLANT
CATH INTERMIT  6FR 70CM (CATHETERS) ×2 IMPLANT
CLOTH BEACON ORANGE TIMEOUT ST (SAFETY) ×2 IMPLANT
DRAPE CAMERA CLOSED 9X96 (DRAPES) ×2 IMPLANT
ELECT REM PT RETURN 9FT ADLT (ELECTROSURGICAL) ×2
ELECTRODE REM PT RTRN 9FT ADLT (ELECTROSURGICAL) IMPLANT
GLOVE BIOGEL M STRL SZ7.5 (GLOVE) ×2 IMPLANT
GOWN PREVENTION PLUS XLARGE (GOWN DISPOSABLE) ×2 IMPLANT
GOWN STRL NON-REIN LRG LVL3 (GOWN DISPOSABLE) ×4 IMPLANT
GUIDEWIRE ANG ZIPWIRE 038X150 (WIRE) IMPLANT
GUIDEWIRE STR DUAL SENSOR (WIRE) ×2 IMPLANT
LASER FIBER DISP (UROLOGICAL SUPPLIES) ×1 IMPLANT
MANIFOLD NEPTUNE II (INSTRUMENTS) ×2 IMPLANT
PACK CYSTO (CUSTOM PROCEDURE TRAY) ×2 IMPLANT
STENT CONTOUR 6FRX24X.038 (STENTS) ×1 IMPLANT
TUBING CONNECTING 10 (TUBING) ×2 IMPLANT

## 2012-01-16 NOTE — Preoperative (Signed)
Beta Blockers   Reason not to administer Beta Blockers:Not Applicable 

## 2012-01-16 NOTE — Anesthesia Postprocedure Evaluation (Signed)
Anesthesia Post Note  Patient: Ruth Douglas  Procedure(s) Performed: Procedure(s) (LRB): CYSTOSCOPY WITH STENT PLACEMENT (Left) HOLMIUM LASER APPLICATION (Left)  Anesthesia type: General  Patient location: PACU  Post pain: Pain level controlled  Post assessment: Post-op Vital signs reviewed  Last Vitals: BP 149/56  Pulse 81  Temp 36.3 C (Oral)  Resp 16  Ht 5\' 3"  (1.6 m)  Wt 160 lb 6.4 oz (72.757 kg)  BMI 28.41 kg/m2  SpO2 99%  Post vital signs: Reviewed  Level of consciousness: sedated  Complications: No apparent anesthesia complications

## 2012-01-16 NOTE — ED Provider Notes (Signed)
Medical screening examination/treatment/procedure(s) were conducted as a shared visit with non-physician practitioner(s) and myself.  I personally evaluated the patient during the encounter 71 yo woman recently hospitalized for kidney stone at [facility] has shortness of breath. Lab workup shows elevated BNP, and chest x-ray shows pulmonary vascular congestion. Plan to admit to treat for CHF.        Carleene Cooper III, MD 01/16/12 [phone]

## 2012-01-16 NOTE — Progress Notes (Signed)
She was transferred to [facility] today by the request of Urology for lithotripsy planned for this afternoon. She left before I could reassess but she has been accepted by Lyda Perone, DO as of last night and will be followed by his team at [facility] upon her arrival. Transfer orders were placed last night.

## 2012-01-16 NOTE — Op Note (Signed)
Preoperative diagnosis: Left ureteral calculus, acute kidney injury  Postoperative diagnosis: Left ureteral calculus, acute kidney injury, Bladder lesion  Procedure:  1. Cystoscopy 2. Left ureteroscopy and stone removal 3. Ureteroscopic laser lithotripsy 4. Left ureteral stent placement (6 x 24) 5. Left retrograde pyelography with interpretation 6. Bladder biopsy and fulguration  Surgeon: Moody Bruins. M.D.  Anesthesia: General  Complications: None  Intraoperative findings: Left retrograde pyelography demonstrated a filling defect within the distal left ureter consistent with the patient's known calculus without other abnormalities.  EBL: Minimal  Specimens: 1. Left ureteral calculus  Disposition of specimens: [facility] for stone analysis  Indication: Ruth Douglas is a 71 y.o. year old patient with urolithiasis. After reviewing the management options for treatment, the patient elected to proceed with the above surgical procedure(s). We have discussed the potential benefits and risks of the procedure, side effects of the proposed treatment, the likelihood of the patient achieving the goals of the procedure, and any potential problems that might occur during the procedure or recuperation. Informed consent has been obtained.  Description of procedure:  The patient was taken to the operating room and general anesthesia was induced.  The patient was placed in the dorsal lithotomy position, prepped and draped in the usual sterile fashion, and preoperative antibiotics were administered. A preoperative time-out was performed.   Cystourethroscopy was performed.  The patient's urethra was examined and was normal. The bladder was then systematically examined in its entirety. There was no evidence for any bladder tumors, stones, but there was severe erythema of the right lateral wall and trigone with some mucinous appearing lesions at the trigone.  These findings  were not particularly suspicious for malignancy but were not expected based on the patient's ureteral stone.    Attention then turned to the left ureteral orifice and a ureteral catheter was used to intubate the ureteral orifice.  Omnipaque contrast was injected through the ureteral catheter and a retrograde pyelogram was performed with findings as dictated above.  A 0.38 sensor guidewire was then advanced up the left ureter into the renal pelvis under fluoroscopic guidance. The 6 Fr semirigid ureteroscope was then advanced into the ureter next to the guidewire and the calculus was identified. Backstop was injected into the ureter above the level of the stone to prevent stone migration.   The stone was then fragmented with the 365 micron holmium laser fiber on a setting of 0.6 J and frequency of 6 Hz.   All stones were then removed from the ureter with a zero tip nitinol basket.  Reinspection of the ureter revealed no remaining visible stones or fragments.   The wire was then backloaded through the cystoscope and a ureteral stent was advance over the wire using Seldinger technique.  The stent was positioned appropriately under fluoroscopic and cystoscopic guidance.  The wire was then removed with an adequate stent curl noted in the renal pelvis as well as in the bladder.  Attention then turned to the bladder abnormalities.  Cold cup biopsy forceps were used to biopsy the abnormal tissue at the trigone and right lateral wall with bugbee electrode used to fulgurate these areas.  This resulted in excellent hemostasis.  The bladder was then emptied and the procedure ended.  The patient appeared to tolerate the procedure well and without complications.  The patient was able to be awakened and transferred to the recovery unit in satisfactory condition.

## 2012-01-16 NOTE — Transfer of Care (Signed)
Immediate Anesthesia Transfer of Care Note  Patient: Ruth Douglas  Procedure(s) Performed: Procedure(s) (LRB) with comments: CYSTOSCOPY WITH STENT PLACEMENT (Left) HOLMIUM LASER APPLICATION (Left)  Patient Location: PACU  Anesthesia Type:General  Level of Consciousness: awake, patient cooperative, lethargic and responds to stimulation  Airway & Oxygen Therapy: Patient Spontanous Breathing and Patient connected to face mask oxygen  Post-op Assessment: Report given to PACU RN, Post -op Vital signs reviewed and stable and Patient moving all extremities  Post vital signs: Reviewed and stable  Complications: No apparent anesthesia complications

## 2012-01-16 NOTE — Progress Notes (Signed)
Pt being transferred to [facility] room 1335 at this time for surgery scheduled later this afternoon.  Report has been given to [facility].  Also reported off to receiving RN who denies any questions or concerns at this time.  Pt leaving unit via ambulance transport and appears in no acute distress. Nino Glow RN

## 2012-01-16 NOTE — Progress Notes (Signed)
  Echocardiogram 2D Echocardiogram has been performed.  Ruth Douglas 01/16/2012, 2:42 PM 

## 2012-01-16 NOTE — Progress Notes (Signed)
Patient ID: Ruth Douglas, female   DOB: 1940/06/26, 71 y.o.   MRN: [id number]  [facility] PROGRESS NOTE  Ruth Douglas VFI:[id number] DOB: 16-Sep-1940 DOA: 01/15/2012 PCP: No primary provider on file.  Brief narrative: Ruth Douglas is a 71 year old woman with PMH of HTN, DM2, and hyperlipidemia who comes in to the [facility] ED for further evaluation of left kidney stone and new onset shortness of breath. She states that 4 days prior to admission she had severe left flank pain, was seen at the [facility] and found to have a left ureteral calculus. She was admitted there, given IVF, and was discharged one day prior to this admission with Cipro 500mg  BID, oxycodone for pain, and home O2. The day of admission she describes feeling increasedly short of breath with "puffiness: over her arms and face. She also complained of a burning sensation with urination for 3-4 days now. Per urologist request pt ws transferred to [facility] for further management. [facility] will assume care.   Principal Problem:  *Renal calculus, left  baseline creatinine is unknown but it is certainly elevated on admission to 2.42. This is most likely explained by her renal calculi.    CT abdomen shows 8 mm distal left ureteral calculus with moderate left hydronephrosis, increased (likely corresponds to the UPJ calculus on recent prior CT) and stable 9 mm left lower pole renal calculus.    Follow upon urology recommendations  Cipro 500mg  PO BID  Active Problems:  DM2 (diabetes mellitus, type 2)  Follow up on A1C  Hold metformin in the setting of acute renal failure   Right foot ulcer  Plan on MRI for further evaluation  AKI (acute kidney injury)  Obtain BMP in AM  Shortness of breath  Secondary to mild pulmonary edema  Monitor oxygen saturations  Oxygen as needed  Consultants:  Urology  Procedures/Studies: 01/16/2012 1. Cystoscopy 2. Left ureteroscopy and stone removal 3. Ureteroscopic laser  lithotripsy 4. Left ureteral stent placement (6 x 24) 5. Left retrograde pyelography with interpretation 6. Bladder biopsy and fulguration  Ct Abdomen Pelvis Wo Contrast 01/15/2012  8 mm distal left ureteral calculus with moderate left hydronephrosis, increased.  This likely corresponds to the UPJ calculus on recent prior CT.  Stable 9 mm left lower pole renal calculus.   Dg Chest 1 View 01/15/2012   1.  Borderline to mildly enlarged cardiac silhouette with pulmonary venous congestion, but no frank pulmonary edema at this time. Findings may suggest some mild congestive heart failure.  Dg Foot Complete Right 01/16/2012   Interval amputations of the first and second rays. Osseous demineralization. No radiographic evidence of osteomyelitis though if this remains a clinical concern, consider MR imaging.   Antibiotics:  Ciprofloxacin 11/17 -->  Code Status: Full Family Communication: Pt at bedside Disposition Plan: Home when medically stable  HPI/Subjective: No events overnight.   Objective: Filed Vitals:   01/16/12 0900 01/16/12 1239 01/16/12 1300 01/16/12 1319  BP: 143/48 137/54 147/56 149/56  Pulse: 77 78 80 81  Temp: 98.2 F (36.8 C) 97.4 F (36.3 C) 97.4 F (36.3 C) 97.4 F (36.3 C)  TempSrc:      Resp: 16 18 18 16   Height:      Weight:      SpO2: 97% 100% 100% 99%    Intake/Output Summary (Last 24 hours) at 01/16/12 1719 Last data filed at 01/16/12 1235  Gross per 24 hour  Intake    560 ml  Output   2000 ml  Net  -1440 ml    Exam:   General:  Pt is alert, follows commands appropriately, not in acute distress  Cardiovascular: Regular rate and rhythm, S1/S2, no murmurs, no rubs, no gallops  Respiratory: Clear to auscultation bilaterally, no wheezing, no crackles, no rhonchi  Abdomen: Soft, non tender, non distended, bowel sounds present, no guarding  Extremities: No edema, pulses DP and PT palpable bilaterally  Neuro: Grossly nonfocal  Data  Reviewed: Basic Metabolic Panel:  Lab 01/16/12 1610 01/15/12 1129  NA 134* 132*  K 3.9 4.2  CL 99 100  CO2 22 19  GLUCOSE 100* 116*  BUN 60* 57*  CREATININE 2.34* 2.42*  CALCIUM 8.2* 8.3*  MG -- --  PHOS -- --   Liver Function Tests:  Lab 01/15/12 1129  AST 35  ALT 23  ALKPHOS 112  BILITOT 0.4  PROT 6.0  ALBUMIN 2.3*   CBC:  Lab 01/16/12 0655 01/15/12 1129  WBC 13.6* 18.3*  NEUTROABS -- --  HGB 8.0* 8.3*  HCT 23.8* 24.9*  MCV 81.5 81.4  PLT 94* 97*   Cardiac Enzymes:  Lab 01/16/12 0655 01/15/12 2122 01/15/12 1548  CKTOTAL -- -- --  CKMB -- -- --  CKMBINDEX -- -- --  TROPONINI <0.30 <0.30 <0.30   CBG:  Lab 01/16/12 1644 01/16/12 1246 01/16/12 0613 01/15/12 2156 01/15/12 1655  GLUCAP 169* 127* 95 109* 100*    Recent Results (from the past 240 hour(s))  CULTURE, BLOOD (ROUTINE X 2)     Status: Normal (Preliminary result)   Collection Time   01/15/12  4:00 PM      Component Value Range Status Comment   Specimen Description BLOOD LEFT HAND   Final    Special Requests BOTTLES DRAWN AEROBIC AND ANAEROBIC 10CC   Final    Culture  Setup Time 01/15/2012 23:40   Final    Culture     Final    Value:        BLOOD CULTURE RECEIVED NO GROWTH TO DATE CULTURE WILL BE HELD FOR 5 DAYS BEFORE ISSUING A FINAL NEGATIVE REPORT   Report Status PENDING   Incomplete   SURGICAL PCR SCREEN     Status: Normal   Collection Time   01/16/12  7:54 AM      Component Value Range Status Comment   MRSA, PCR NEGATIVE  NEGATIVE Final    Staphylococcus aureus NEGATIVE  NEGATIVE Final      Scheduled Meds:   . atorvastatin  40 mg Oral Daily  . ciprofloxacin  500 mg Oral BID  . insulin aspart  0-9 Units Subcutaneous TID WC  . Tamsulosin HCl  0.4 mg Oral Daily  . Verapamil HCl CR  1 capsule Oral Daily   Continuous Infusions:   . sodium chloride       Debbora Presto, MD  [facility] Pager [phone]  If 7PM-7AM, please contact night-coverage [email or web address] Password  [email or web address] 01/16/2012, 5:19 PM   LOS: 1 day

## 2012-01-16 NOTE — Anesthesia Preprocedure Evaluation (Addendum)
Anesthesia Evaluation  Patient identified by MRN, date of birth, ID band Patient awake    Reviewed: Allergy & Precautions, H&P , NPO status , Patient's Chart, lab work & pertinent test results  Airway Mallampati: II TM Distance: >3 FB Neck ROM: Full    Dental  (+) Dental Advisory Given, Edentulous Upper and Edentulous Lower   Pulmonary shortness of breath and with exertion,  breath sounds clear to auscultation  Pulmonary exam normal       Cardiovascular hypertension, Pt. on medications Rhythm:Regular Rate:Normal     Neuro/Psych negative neurological ROS  negative psych ROS   GI/Hepatic negative GI ROS, Neg liver ROS,   Endo/Other  diabetes, Well Controlled, Type 2  Renal/GU CRF and Renal InsufficiencyRenal disease     Musculoskeletal negative musculoskeletal ROS (+)   Abdominal   Peds  Hematology negative hematology ROS (+)   Anesthesia Other Findings   Reproductive/Obstetrics                         Anesthesia Physical Anesthesia Plan  ASA: III  Anesthesia Plan: General   Post-op Pain Management:    Induction: Intravenous  Airway Management Planned: LMA  Additional Equipment:   Intra-op Plan:   Post-operative Plan: Extubation in OR  Informed Consent: I have reviewed the patients History and Physical, chart, labs and discussed the procedure including the risks, benefits and alternatives for the proposed anesthesia with the patient or authorized representative who has indicated his/her understanding and acceptance.   Dental advisory given  Plan Discussed with: CRNA  Anesthesia Plan Comments:         Anesthesia Quick Evaluation

## 2012-01-16 NOTE — Progress Notes (Signed)
Patient ID: Ruth Douglas, female   DOB: 1940/05/18, 71 y.o.   MRN: [id number]  Day of Surgery Subjective: Pt still with left abdominal pain and nausea.  She has not passed her stone.  Objective: Vital signs in last 24 hours: Temp:  [98 F (36.7 C)-98.4 F (36.9 C)] 98.2 F (36.8 C) (11/18 0900) Pulse Rate:  [77-87] 77  (11/18 0900) Resp:  [16-20] 16  (11/18 0900) BP: (140-167)/(48-74) 143/48 mmHg (11/18 0900) SpO2:  [97 %-100 %] 97 % (11/18 0900) FiO2 (%):  [2 %] 2 % (11/17 1215) Weight:  [72.757 kg (160 lb 6.4 oz)-75.2 kg (165 lb 12.6 oz)] 72.757 kg (160 lb 6.4 oz) (11/18 0454)  Intake/Output from previous day: 11/17 0701 - 11/18 0700 In: 60 [P.O.:60] Out: 3000 [Urine:3000] Intake/Output this shift: Total I/O In: -  Out: 600 [Urine:600]  Physical Exam:  General: Alert and oriented CV: RRR Lungs: Clear Abdomen: Mild left CVAT  Lab Results:  [facility] 01/16/12 0655 01/15/12 1129  HGB 8.0* 8.3*  HCT 23.8* 24.9*   BMET  Basename 01/16/12 0655 01/15/12 1129  NA 134* 132*  K 3.9 4.2  CL 99 100  CO2 22 19  GLUCOSE 100* 116*  BUN 60* 57*  CREATININE 2.34* 2.42*  CALCIUM 8.2* 8.3*     Studies/Results:   Assessment/Plan: - Will proceed with left ureteroscopic laser lithotripsy and left ureteral stent placement.   LOS: 1 day   Katoya Amato,LES 01/16/2012, 11:12 AM

## 2012-01-17 ENCOUNTER — Encounter (HOSPITAL_COMMUNITY): Payer: Self-pay | Admitting: Urology

## 2012-01-17 LAB — CBC
Hemoglobin: 8.2 g/dL — ABNORMAL LOW (ref 12.0–15.0)
MCH: 27 pg (ref 26.0–34.0)
MCHC: 33.3 g/dL (ref 30.0–36.0)
MCV: 80.9 fL (ref 78.0–100.0)
RBC: 3.04 MIL/uL — ABNORMAL LOW (ref 3.87–5.11)

## 2012-01-17 LAB — BASIC METABOLIC PANEL
BUN: 40 mg/dL — ABNORMAL HIGH (ref 6–23)
CO2: 23 mEq/L (ref 19–32)
Glucose, Bld: 148 mg/dL — ABNORMAL HIGH (ref 70–99)
Potassium: 3.8 mEq/L (ref 3.5–5.1)
Sodium: 134 mEq/L — ABNORMAL LOW (ref 135–145)

## 2012-01-17 MED ORDER — TAMSULOSIN HCL 0.4 MG PO CAPS: 0.4000 mg | ORAL_CAPSULE | Freq: Every day | ORAL | Status: DC

## 2012-01-17 MED ORDER — CIPROFLOXACIN HCL 500 MG PO TABS: 500.0000 mg | ORAL_TABLET | Freq: Two times a day (BID) | ORAL | Status: DC

## 2012-01-17 MED ORDER — OXYCODONE-ACETAMINOPHEN 5-325 MG PO TABS: 1.0000 | ORAL_TABLET | ORAL | Status: DC | PRN

## 2012-01-17 NOTE — Discharge Summary (Signed)
Physician Discharge Summary  Ruth Douglas WJX:[id number] DOB: Jan 18, 1941 DOA: 01/15/2012  PCP: No primary provider on file.  Admit date: 01/15/2012 Discharge date: 01/17/2012  Recommendations for Outpatient Follow-up:  1. Pt will need to follow up with PCP in 2-3 weeks post discharge 2. Please obtain BMP to evaluate electrolytes and kidney function 3. Please also check CBC to evaluate Hg and Hct levels 4. Please note that Dr. Pamala Hurry will have his office call pt for the appointment schedule 5. Pt made aware 6. Please note that pt was sent home on Ciprofloxacin to complete the therapy for 7 more days 7. Please note that pt was told to stop taking metformin for one week until her kidney function is stabilized 8. Will make her PCP aware  Discharge Diagnoses:  Renal calculus, left Principal Problem:  *Renal calculus, left Active Problems:  DM2 (diabetes mellitus, type 2)  Right foot ulcer  AKI (acute kidney injury)  Shortness of breath  Discharge Condition: Stable  Diet recommendation: Heart healthy diet discussed in details   History of present illness:  [name] is a 71 year old woman with PMH of HTN, DM2, and hyperlipidemia who comes in to the [facility] ED for further evaluation of left kidney stone and new onset shortness of breath. She states that 4 days prior to admission she had severe left flank pain, was seen at the [facility] and found to have a left ureteral calculus. She was admitted there, given IVF, and was discharged one day prior to this admission with Cipro 500mg  BID, oxycodone for pain, and home O2. The day of admission she describes feeling increasedly short of breath with "puffiness: over her arms and face. She also complained of a burning sensation with urination for 3-4 days now. Per urologist request pt ws transferred to [facility] for further management. [facility] will assume care.   Principal Problem:  *Renal calculus, left  baseline creatinine is unknown but it is  certainly elevated on admission to 2.42. This is most likely explained by her renal calculi.  CT abdomen shows 8 mm distal left ureteral calculus with moderate left hydronephrosis, increased (likely corresponds to the UPJ calculus on recent prior CT) and stable 9 mm left lower pole renal calculus.  Follow upon urology recommendations 1. Pt is now status post Left ureteroscopy and stone removal, Ureteroscopic laser lithotripsy, doing well and wants to go home Pt is cleared for discharge from urology perspective   Cipro 500mg  PO BID to continue upon discharge for 7 more days Active Problems:  DM2 (diabetes mellitus, type 2)  A1C 7.6 CBG have been well controlled during the hospital stay Held metformin in the setting of acute renal failure  May resume once PCP ensures that kidney function has stabilized Right foot ulcer  Stable AKI (acute kidney injury)  Creatinine is trending down and nearly within normal limits Will need to have BMP checked in next few days to ensure pt can restart Metformin  Shortness of breath  Secondary to mild pulmonary edema  Monitored oxygen saturations and has been stable Oxygen as needed provided but pt has been maintaining oxygen saturations at target range on RA  Consultants:  Urology  Procedures/Studies:  01/16/2012  2. Cystoscopy 3. Left ureteroscopy and stone removal 4. Ureteroscopic laser lithotripsy 5. Left ureteral stent placement (6 x 24) 6. Left retrograde pyelography with interpretation 7. Bladder biopsy and fulguration  Ct Abdomen Pelvis Wo Contrast  01/15/2012  8 mm distal left ureteral calculus with moderate left hydronephrosis,  increased. This likely corresponds to the UPJ calculus on recent prior CT. Stable 9 mm left lower pole renal calculus.  Dg Chest 1 View  01/15/2012  1. Borderline to mildly enlarged cardiac silhouette with pulmonary venous congestion, but no frank pulmonary edema at this time. Findings may suggest some mild  congestive heart failure.  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 Osseous demineralization. No radiographic evidence of osteomyelitis though if this remains a clinical concern, consider MR imaging.   Antibiotics:  Ciprofloxacin 11/17 --> 7 more days upon discharge   Discharge Exam: Filed Vitals:   01/17/12 0643  BP: 151/57  Pulse: 72  Temp: 98.2 F (36.8 C)  Resp: 16   Filed Vitals:   01/16/12 1300 01/16/12 1319 01/16/12 2100 01/17/12 0643  BP: 147/56 149/56 142/58 151/57  Pulse: 80 81 82 72  Temp: 97.4 F (36.3 C) 97.4 F (36.3 C) 98.3 F (36.8 C) 98.2 F (36.8 C)  TempSrc:    Oral  Resp: 18 16 16 16   Height:      Weight:      SpO2: 100% 99% 97% 96%    General: Pt is alert, follows commands appropriately, not in acute distress Cardiovascular: Regular rate and rhythm, S1/S2 +, no murmurs, no rubs, no gallops Respiratory: Clear to auscultation bilaterally, no wheezing, no crackles, no rhonchi Abdominal: Soft, non tender, non distended, bowel sounds +, no guarding Extremities: no edema, no cyanosis, pulses palpable bilaterally DP and PT Neuro: Grossly nonfocal  Discharge Instructions  Discharge Orders    Future Orders Please Complete By Expires   Diet - low sodium heart healthy      Increase activity slowly          Medication List     As of 01/17/2012  9:55 AM    STOP taking these medications         metFORMIN 1000 MG tablet   Commonly known as: GLUCOPHAGE      TAKE these medications         atorvastatin 40 MG tablet   Commonly known as: LIPITOR   Take 40 mg by mouth daily.      ciprofloxacin 500 MG tablet   Commonly known as: CIPRO   Take 1 tablet (500 mg total) by mouth 2 (two) times daily.      ciprofloxacin 500 MG tablet   Commonly known as: CIPRO   Take 500 mg by mouth 2 (two) times daily.      oxyCODONE-acetaminophen 5-325 MG per tablet   Commonly known as: PERCOCET/ROXICET   Take 1-2  tablets by mouth every 4 (four) hours as needed.      oxyCODONE-acetaminophen 5-325 MG per tablet   Commonly known as: PERCOCET/ROXICET   Take 1-2 tablets by mouth every 4 (four) hours as needed. For pain      Tamsulosin HCl 0.4 MG Caps   Commonly known as: FLOMAX   Take 1 capsule (0.4 mg total) by mouth daily.      Verapamil HCl CR 200 MG Cp24   Take 1 capsule by mouth daily.           Follow-up Information    Follow up with BORDEN,LES, MD. (Will call to schedule follow up)    Contact information:   [address] [place] [address] [phone]           The results of significant diagnostics from this  hospitalization (including imaging, microbiology, ancillary and laboratory) are listed below for reference.     Microbiology: Recent Results (from the past 240 hour(s))  CULTURE, BLOOD (ROUTINE X 2)     Status: Normal (Preliminary result)   Collection Time   01/15/12  4:00 PM      Component Value Range Status Comment   Specimen Description BLOOD LEFT HAND   Final    Special Requests BOTTLES DRAWN AEROBIC AND ANAEROBIC 10CC   Final    Culture  Setup Time 01/15/2012 23:40   Final    Culture     Final    Value:        BLOOD CULTURE RECEIVED NO GROWTH TO DATE CULTURE WILL BE HELD FOR 5 DAYS BEFORE ISSUING A FINAL NEGATIVE REPORT   Report Status PENDING   Incomplete   CULTURE, BLOOD (ROUTINE X 2)     Status: Normal (Preliminary result)   Collection Time   01/15/12  4:32 PM      Component Value Range Status Comment   Specimen Description BLOOD LEFT ARM   Final    Special Requests BOTTLES DRAWN AEROBIC AND ANAEROBIC 10CC   Final    Culture  Setup Time 01/16/2012 08:55   Final    Culture     Final    Value:        BLOOD CULTURE RECEIVED NO GROWTH TO DATE CULTURE WILL BE HELD FOR 5 DAYS BEFORE ISSUING A FINAL NEGATIVE REPORT   Report Status PENDING   Incomplete   SURGICAL PCR SCREEN     Status: Normal   Collection  Time   01/16/12  7:54 AM      Component Value Range Status Comment   MRSA, PCR NEGATIVE  NEGATIVE Final    Staphylococcus aureus NEGATIVE  NEGATIVE Final      Labs: Basic Metabolic Panel:  Lab 01/17/12 4098 01/16/12 0655 01/15/12 1129  NA 134* 134* 132*  K 3.8 3.9 4.2  CL 101 99 100  CO2 23 22 19   GLUCOSE 148* 100* 116*  BUN 40* 60* 57*  CREATININE 1.35* 2.34* 2.42*  CALCIUM 8.2* 8.2* 8.3*  MG -- -- --  PHOS -- -- --   Liver Function Tests:  Lab 01/15/12 1129  AST 35  ALT 23  ALKPHOS 112  BILITOT 0.4  PROT 6.0  ALBUMIN 2.3*   CBC:  Lab 01/17/12 0422 01/16/12 0655 01/15/12 1129  WBC 11.7* 13.6* 18.3*  NEUTROABS -- -- --  HGB 8.2* 8.0* 8.3*  HCT 24.6* 23.8* 24.9*  MCV 80.9 81.5 81.4  PLT 120* 94* 97*   Cardiac Enzymes:  Lab 01/16/12 0655 01/15/12 2122 01/15/12 1548  CKTOTAL -- -- --  CKMB -- -- --  CKMBINDEX -- -- --  TROPONINI <0.30 <0.30 <0.30   BNP: BNP (last 3 results)  Basename 01/15/12 1142  PROBNP 6662.0*   CBG:  Lab 01/17/12 0735 01/16/12 2117 01/16/12 1644 01/16/12 1246 01/16/12 0613  GLUCAP 150* 183* 169* 127* 95     SIGNED: Time coordinating discharge: Over 30 minutes  Debbora Presto, MD  [facility] 01/17/2012, 9:55 AM Pager [phone]  If 7PM-7AM, please contact night-coverage [email or web address] Password [email or web address]

## 2012-01-17 NOTE — Progress Notes (Signed)
Patient ID: Ruth Douglas, female   DOB: Dec 20, 1940, 71 y.o.   MRN: [id number]  1 Day Post-Op Subjective: Pt feeling better.  Pain significantly improved.  Objective: Vital signs in last 24 hours: Temp:  [97.4 F (36.3 C)-98.3 F (36.8 C)] 98.2 F (36.8 C) (11/19 0643) Pulse Rate:  [72-82] 72  (11/19 0643) Resp:  [16-18] 16  (11/19 0643) BP: (137-151)/(48-58) 151/57 mmHg (11/19 0643) SpO2:  [96 %-100 %] 96 % (11/19 0643)  Intake/Output from previous day: 11/18 0701 - 11/19 0700 In: 1100 [P.O.:600; I.V.:500] Out: 2100 [Urine:2100] Intake/Output this shift:    Physical Exam:  General: Alert and oriented Abdomen: Soft, ND, Minimal CVAT  Lab Results:  Basename 01/17/12 0422 01/16/12 0655 01/15/12 1129  HGB 8.2* 8.0* 8.3*  HCT 24.6* 23.8* 24.9*   BMET  Basename 01/17/12 0422 01/16/12 0655  NA 134* 134*  K 3.8 3.9  CL 101 99  CO2 23 22  GLUCOSE 148* 100*  BUN 40* 60*  CREATININE 1.35* 2.34*  CALCIUM 8.2* 8.2*     Studies/Results:   Assessment/Plan: S/P left ureteroscopic laser lithotripsy and stent placement - Pt improved and renal function improved.  Ok for d/c home from urology perspective.  Will arrange f/u as outpatient for stent removal.   LOS: 2 days   Arneta Mahmood,LES 01/17/2012, 7:03 AM

## 2012-01-18 LAB — URINE CULTURE

## 2012-01-21 ENCOUNTER — Emergency Department (HOSPITAL_COMMUNITY)
Admission: EM | Admit: 2012-01-21 | Discharge: 2012-01-21 | Disposition: A | Discharge status: Home / Self Care | Payer: Medicare PPO | Attending: Emergency Medicine | Admitting: Emergency Medicine

## 2012-01-21 ENCOUNTER — Encounter (HOSPITAL_COMMUNITY): Payer: Self-pay | Admitting: *Deleted

## 2012-01-21 DIAGNOSIS — Z87448 Personal history of other diseases of urinary system: Secondary | ICD-10-CM | POA: Insufficient documentation

## 2012-01-21 DIAGNOSIS — M7989 Other specified soft tissue disorders: Secondary | ICD-10-CM

## 2012-01-21 DIAGNOSIS — M79609 Pain in unspecified limb: Secondary | ICD-10-CM

## 2012-01-21 DIAGNOSIS — I509 Heart failure, unspecified: Secondary | ICD-10-CM | POA: Insufficient documentation

## 2012-01-21 DIAGNOSIS — R109 Unspecified abdominal pain: Secondary | ICD-10-CM | POA: Insufficient documentation

## 2012-01-21 DIAGNOSIS — R0602 Shortness of breath: Secondary | ICD-10-CM | POA: Insufficient documentation

## 2012-01-21 DIAGNOSIS — I10 Essential (primary) hypertension: Secondary | ICD-10-CM | POA: Insufficient documentation

## 2012-01-21 DIAGNOSIS — E119 Type 2 diabetes mellitus without complications: Secondary | ICD-10-CM | POA: Insufficient documentation

## 2012-01-21 DIAGNOSIS — Z79899 Other long term (current) drug therapy: Secondary | ICD-10-CM | POA: Insufficient documentation

## 2012-01-21 DIAGNOSIS — I82409 Acute embolism and thrombosis of unspecified deep veins of unspecified lower extremity: Secondary | ICD-10-CM | POA: Insufficient documentation

## 2012-01-21 HISTORY — DX: Heart failure, unspecified: I50.9

## 2012-01-21 LAB — CBC WITH DIFFERENTIAL/PLATELET
Basophils Absolute: 0 10*3/uL (ref 0.0–0.1)
Basophils Relative: 0 % (ref 0–1)
Eosinophils Relative: 2 % (ref 0–5)
Lymphocytes Relative: 18 % (ref 12–46)
MCV: 83.2 fL (ref 78.0–100.0)
Platelets: 330 10*3/uL (ref 150–400)
RDW: 13.3 % (ref 11.5–15.5)
WBC: 7.3 10*3/uL (ref 4.0–10.5)

## 2012-01-21 LAB — POCT I-STAT, CHEM 8
BUN: 14 mg/dL (ref 6–23)
Chloride: 98 mEq/L (ref 96–112)
Creatinine, Ser: 1.2 mg/dL — ABNORMAL HIGH (ref 0.50–1.10)
Glucose, Bld: 156 mg/dL — ABNORMAL HIGH (ref 70–99)
Potassium: 3.8 mEq/L (ref 3.5–5.1)

## 2012-01-21 LAB — COMPREHENSIVE METABOLIC PANEL
ALT: 12 U/L (ref 0–35)
AST: 11 U/L (ref 0–37)
Albumin: 2.8 g/dL — ABNORMAL LOW (ref 3.5–5.2)
CO2: 28 mEq/L (ref 19–32)
Calcium: 8.5 mg/dL (ref 8.4–10.5)
GFR calc non Af Amer: 47 mL/min — ABNORMAL LOW (ref >90)
Sodium: 134 mEq/L — ABNORMAL LOW (ref 135–145)
Total Protein: 6.6 g/dL (ref 6.0–8.3)

## 2012-01-21 LAB — URINALYSIS, ROUTINE W REFLEX MICROSCOPIC
Glucose, UA: NEGATIVE mg/dL
Nitrite: NEGATIVE
pH: 5 (ref 5.0–8.0)

## 2012-01-21 LAB — PROTIME-INR: INR: 1.22 (ref 0.00–1.49)

## 2012-01-21 LAB — CULTURE, BLOOD (ROUTINE X 2)

## 2012-01-21 LAB — GLUCOSE, CAPILLARY: Glucose-Capillary: 137 mg/dL — ABNORMAL HIGH (ref 70–99)

## 2012-01-21 LAB — URINE MICROSCOPIC-ADD ON

## 2012-01-21 MED ORDER — RIVAROXABAN 15 MG PO TABS
15.0000 mg | ORAL_TABLET | Freq: Every day | ORAL | Status: DC
  Administered 2012-01-21: 15 mg via ORAL
  Filled 2012-01-21: qty 1

## 2012-01-21 MED ORDER — RIVAROXABAN 15 MG PO TABS: 15.0000 mg | ORAL_TABLET | Freq: Two times a day (BID) | ORAL | Status: DC

## 2012-01-21 NOTE — ED Provider Notes (Signed)
History     CSN: [id number]  Arrival date & time 01/21/12  1111   First MD Initiated Contact with Patient 01/21/12 1127      Chief Complaint  Patient presents with  . Leg Swelling  . Abdominal Pain    (Consider location/radiation/quality/duration/timing/severity/associated sxs/prior treatment) HPI  Ruth Douglas is a 71 y.o. female complaining of left leg swelling since she was discharged from the hospital 3 days ago. Patient has been essentially immobile since that time. Patient does report a shortness of breath but she has been recently diagnosed with CHF the last week. She is on Lasix at home. She has no primary care doctor since discharge 2 days ago. She denies chest pain, palpitations, she reports upper abdominal tightness and early satiety. She denies nausea vomiting, change in bladder or bowel habits.   Past Medical History  Diagnosis Date  . Renal disorder   . Blood transfusion without reported diagnosis   . Diabetes mellitus without complication   . Hypertension   . CHF (congestive heart failure)     Past Surgical History  Procedure Date  . Toe amputation   . Cesarean section   . Cystoscopy with stent placement 01/16/2012    Procedure: CYSTOSCOPY WITH STENT PLACEMENT;  Surgeon: Crecencio Mc, MD;  Location: [facility];  Service: Urology;  Laterality: Left;  cysto, retrograde , ureteroscopy , stone extraction with laser lithotripsy, stent placement on left, bladder biopsies with fulgaration  . Holmium laser application 01/16/2012    Procedure: HOLMIUM LASER APPLICATION;  Surgeon: Crecencio Mc, MD;  Location: [facility];  Service: Urology;  Laterality: Left;    No family history on file.  History  Substance Use Topics  . Smoking status: Never Smoker   . Smokeless tobacco: Not on file  . Alcohol Use: No    OB History    Grav Para Term Preterm Abortions TAB SAB Ect Mult Living                  Review of Systems  Constitutional: Negative for fever.  Respiratory:  Positive for shortness of breath.   Cardiovascular: Positive for leg swelling. Negative for chest pain.  Gastrointestinal: Negative for nausea, vomiting and diarrhea.  All other systems reviewed and are negative.    Allergies  Review of patient's allergies indicates no known allergies.  Home Medications   Current Outpatient Rx  Name  Route  Sig  Dispense  Refill  . ATORVASTATIN CALCIUM 40 MG PO TABS   Oral   Take 40 mg by mouth daily.         Marland Kitchen CIPROFLOXACIN HCL 500 MG PO TABS   Oral   Take 500 mg by mouth 2 (two) times daily.         Marland Kitchen CIPROFLOXACIN HCL 500 MG PO TABS   Oral   Take 1 tablet (500 mg total) by mouth 2 (two) times daily.   14 tablet   0   . OXYCODONE-ACETAMINOPHEN 5-325 MG PO TABS   Oral   Take 1-2 tablets by mouth every 4 (four) hours as needed. For pain         . OXYCODONE-ACETAMINOPHEN 5-325 MG PO TABS   Oral   Take 1-2 tablets by mouth every 4 (four) hours as needed.   45 tablet   0   . TAMSULOSIN HCL 0.4 MG PO CAPS   Oral   Take 1 capsule (0.4 mg total) by mouth daily.   30 capsule   0   .  VERAPAMIL HCL ER 200 MG PO CP24   Oral   Take 1 capsule by mouth daily.           BP 131/48  Pulse 64  Temp 99.3 F (37.4 C) (Oral)  Resp 28  Ht 5\' 3"  (1.6 m)  Wt 155 lb (70.308 kg)  BMI 27.46 kg/m2  SpO2 96%  Physical Exam  Nursing note and vitals reviewed. Constitutional: She is oriented to person, place, and time. She appears well-developed and well-nourished. No distress.  HENT:  Head: Normocephalic.  Mouth/Throat: Oropharynx is clear and moist.  Eyes: Conjunctivae normal and EOM are normal. Pupils are equal, round, and reactive to light.  Cardiovascular: Normal rate, regular rhythm, normal heart sounds and intact distal pulses.   Pulmonary/Chest: Effort normal and breath sounds normal. No stridor. No respiratory distress. She has no wheezes. She has no rales. She exhibits no tenderness.  Abdominal: Soft. Bowel sounds are normal.  She exhibits no distension and no mass. There is no tenderness. There is no rebound and no guarding.  Musculoskeletal: Normal range of motion.       Left ankle atrophic and shiny, tenderness to palpation of left shin, no palpable cords, superficial collaterals or varicosities.  Neurological: She is alert and oriented to person, place, and time.  Psychiatric: She has a normal mood and affect.    ED Course  Procedures (including critical care time)  Labs Reviewed  CBC WITH DIFFERENTIAL - Abnormal; Notable for the following:    RBC 3.09 (*)     Hemoglobin 8.4 (*)     HCT 25.7 (*)     All other components within normal limits  POCT I-STAT, CHEM 8 - Abnormal; Notable for the following:    Creatinine, Ser 1.20 (*)     Glucose, Bld 156 (*)     Calcium, Ion 1.12 (*)     Hemoglobin 8.5 (*)     HCT 25.0 (*)     All other components within normal limits  COMPREHENSIVE METABOLIC PANEL  PRO B NATRIURETIC PEPTIDE   No results found.   Date: 01/21/2012  Rate: 100  Rhythm: sinus tachycardia  QRS Axis: normal  Intervals: normal  ST/T Wave abnormalities: normal  Conduction Disutrbances:none  Narrative Interpretation:   Old EKG Reviewed: unchanged    1. DVT (deep venous thrombosis)       MDM   Verbal report  Confirming DVT from vascular Tech. Pt has no  history of GI bleed or CVA she is a candidate for anticoagulation.    discussed with attending Dr. Bernette Mayers who recommends a roll toe as there is no need for bridging. She will receive the first dose here and will write her prescription for the first 3 weeks of her medication. Patient currently does not have a primary care Dr. as her prior primary care doctor no longer takes her insurance. I consulted hospitalist Dr. Jerral Ralph to discuss the case and plan to discharge her with followup at the [facility] as outpatient clinic in the [facility] cone urgent care. He agrees with this care plan and is advised that she call on Monday to set up an  outpatient appointment.  Results and plan are discussed with with patient and family they have verbalized their understanding. Return Precautions given.   New Prescriptions   RIVAROXABAN (XARELTO) 15 MG TABS TABLET    Take 1 tablet (15 mg total) by mouth 2 (two) times daily.  Wynetta Emery, PA-C 01/21/12 [phone]

## 2012-01-21 NOTE — ED Notes (Signed)
Patient with renal stent placed on Monday on the left side.  She noted onset of swelling in her left leg on Thursday and she has abdominal pressure/tightness.  She feels like she has gained "20 pounds"  Patient is voiding normally.  Patient had large kidney stone and thus the reason for the stent.  Patient reports her bowels are normal,  Last bm yesterday.  Patient states she cannot eat.  She states she is feeling short of breath.  This is new sx for patient as well.  Patient dx with heart failure in the past week.  Patient denies chest pain

## 2012-01-21 NOTE — Progress Notes (Signed)
VASCULAR LAB PRELIMINARY  PRELIMINARY  PRELIMINARY  PRELIMINARY  Left lower extremity venous Doppler completed.    Preliminary report:  There is acute, occlusive DVT noted in the left posterior tibial vein from mid calf to knee.  The greater saphenous vein appears dilated with sluggish flow at it's origin.  Ruth Douglas, 01/21/2012, 12:09 PM

## 2012-01-21 NOTE — ED Notes (Signed)
Patient has remained alert and oriented.  No complaints.  She has had 3 episodes of loose stools.  She has been advised to contact her MD regarding follow up if stools continue.  Patient has been educated on new medication and risks associated.

## 2012-01-21 NOTE — ED Provider Notes (Signed)
Medical screening examination/treatment/procedure(s) were performed by non-physician practitioner and as supervising physician I was immediately available for consultation/collaboration.   Henson Fraticelli B. Bernette Mayers, MD 01/21/12 1404

## 2012-01-22 LAB — CULTURE, BLOOD (ROUTINE X 2): Culture: NO GROWTH

## 2012-01-27 ENCOUNTER — Emergency Department (HOSPITAL_COMMUNITY)
Admission: EM | Admit: 2012-01-27 | Discharge: 2012-01-27 | Disposition: A | Discharge status: Home / Self Care | Payer: Medicare PPO | Attending: Emergency Medicine | Admitting: Emergency Medicine

## 2012-01-27 ENCOUNTER — Encounter (HOSPITAL_COMMUNITY): Payer: Self-pay | Admitting: Emergency Medicine

## 2012-01-27 DIAGNOSIS — E119 Type 2 diabetes mellitus without complications: Secondary | ICD-10-CM | POA: Insufficient documentation

## 2012-01-27 DIAGNOSIS — I509 Heart failure, unspecified: Secondary | ICD-10-CM | POA: Insufficient documentation

## 2012-01-27 DIAGNOSIS — Z87448 Personal history of other diseases of urinary system: Secondary | ICD-10-CM | POA: Insufficient documentation

## 2012-01-27 DIAGNOSIS — I82409 Acute embolism and thrombosis of unspecified deep veins of unspecified lower extremity: Secondary | ICD-10-CM | POA: Insufficient documentation

## 2012-01-27 DIAGNOSIS — Z7901 Long term (current) use of anticoagulants: Secondary | ICD-10-CM | POA: Insufficient documentation

## 2012-01-27 DIAGNOSIS — Z87442 Personal history of urinary calculi: Secondary | ICD-10-CM | POA: Insufficient documentation

## 2012-01-27 DIAGNOSIS — Z9889 Other specified postprocedural states: Secondary | ICD-10-CM | POA: Insufficient documentation

## 2012-01-27 DIAGNOSIS — Z79899 Other long term (current) drug therapy: Secondary | ICD-10-CM | POA: Insufficient documentation

## 2012-01-27 DIAGNOSIS — I10 Essential (primary) hypertension: Secondary | ICD-10-CM | POA: Insufficient documentation

## 2012-01-27 DIAGNOSIS — R319 Hematuria, unspecified: Secondary | ICD-10-CM | POA: Insufficient documentation

## 2012-01-27 DIAGNOSIS — Z960 Presence of urogenital implants: Secondary | ICD-10-CM

## 2012-01-27 DIAGNOSIS — Z792 Long term (current) use of antibiotics: Secondary | ICD-10-CM | POA: Insufficient documentation

## 2012-01-27 LAB — CBC WITH DIFFERENTIAL/PLATELET
Basophils Absolute: 0 10*3/uL (ref 0.0–0.1)
Eosinophils Relative: 4 % (ref 0–5)
HCT: 27 % — ABNORMAL LOW (ref 36.0–46.0)
Hemoglobin: 8.8 g/dL — ABNORMAL LOW (ref 12.0–15.0)
Lymphocytes Relative: 31 % (ref 12–46)
MCHC: 32.6 g/dL (ref 30.0–36.0)
MCV: 82.6 fL (ref 78.0–100.0)
Monocytes Absolute: 0.4 10*3/uL (ref 0.1–1.0)
Monocytes Relative: 8 % (ref 3–12)
Neutro Abs: 2.9 10*3/uL (ref 1.7–7.7)
RDW: 13.2 % (ref 11.5–15.5)
WBC: 5 10*3/uL (ref 4.0–10.5)

## 2012-01-27 LAB — URINALYSIS, ROUTINE W REFLEX MICROSCOPIC
Ketones, ur: 15 mg/dL — AB
Nitrite: NEGATIVE
Specific Gravity, Urine: 1.021 (ref 1.005–1.030)
pH: 5.5 (ref 5.0–8.0)

## 2012-01-27 LAB — BASIC METABOLIC PANEL
BUN: 11 mg/dL (ref 6–23)
CO2: 27 mEq/L (ref 19–32)
Chloride: 101 mEq/L (ref 96–112)
Creatinine, Ser: 1.3 mg/dL — ABNORMAL HIGH (ref 0.50–1.10)
Glucose, Bld: 127 mg/dL — ABNORMAL HIGH (ref 70–99)
Potassium: 4.1 mEq/L (ref 3.5–5.1)

## 2012-01-27 LAB — URINE MICROSCOPIC-ADD ON

## 2012-01-27 NOTE — ED Notes (Signed)
Pt here for hematuria x 3 days; pt sts had kidney stone sx with stent 2 weeks ago and then developed DVT and now taking xerelto; pt sts blood had resolved but now having bleeding again

## 2012-01-27 NOTE — ED Provider Notes (Signed)
History     CSN: [id number]  Arrival date & time 01/27/12  1256   First MD Initiated Contact with Patient 01/27/12 1347      Chief Complaint  Patient presents with  . Hematuria    (Consider location/radiation/quality/duration/timing/severity/associated sxs/prior treatment) HPI Comments: Patient recently diagnosed with renal calculus, had stent placed, then developed a dvt.  She was started on xarelto.  She started today with blood in the urine.  She denies fevers or chills.  No worsening pain.  No trauma or injury.  She was treated by Dr. Laverle Patter with Urology.  Patient is a 71 y.o. female presenting with hematuria. The history is provided by the patient.  Hematuria This is a new problem. The current episode started today. The problem has been gradually worsening since onset. She describes the hematuria as gross hematuria. The hematuria occurs throughout her entire urinary stream. She reports no clotting in her urine stream. She is experiencing no pain.    Past Medical History  Diagnosis Date  . Renal disorder   . Blood transfusion without reported diagnosis   . Diabetes mellitus without complication   . Hypertension   . CHF (congestive heart failure)     Past Surgical History  Procedure Date  . Toe amputation   . Cesarean section   . Cystoscopy with stent placement 01/16/2012    Procedure: CYSTOSCOPY WITH STENT PLACEMENT;  Surgeon: Crecencio Mc, MD;  Location: [facility];  Service: Urology;  Laterality: Left;  cysto, retrograde , ureteroscopy , stone extraction with laser lithotripsy, stent placement on left, bladder biopsies with fulgaration  . Holmium laser application 01/16/2012    Procedure: HOLMIUM LASER APPLICATION;  Surgeon: Crecencio Mc, MD;  Location: [facility];  Service: Urology;  Laterality: Left;    History reviewed. No pertinent family history.  History  Substance Use Topics  . Smoking status: Never Smoker   . Smokeless tobacco: Not on file  . Alcohol Use: No    OB  History    Grav Para Term Preterm Abortions TAB SAB Ect Mult Living                  Review of Systems  Genitourinary: Positive for hematuria.  All other systems reviewed and are negative.    Allergies  Review of patient's allergies indicates no known allergies.  Home Medications   Current Outpatient Rx  Name  Route  Sig  Dispense  Refill  . ATORVASTATIN CALCIUM 40 MG PO TABS   Oral   Take 40 mg by mouth daily.         Marland Kitchen CIPROFLOXACIN HCL 500 MG PO TABS   Oral   Take 500 mg by mouth 2 (two) times daily.         . OXYCODONE-ACETAMINOPHEN 5-325 MG PO TABS   Oral   Take 1-2 tablets by mouth every 4 (four) hours as needed. For pain         . RIVAROXABAN 15 MG PO TABS   Oral   Take 1 tablet (15 mg total) by mouth 2 (two) times daily.   41 tablet   0   . TAMSULOSIN HCL 0.4 MG PO CAPS   Oral   Take 1 capsule (0.4 mg total) by mouth daily.   30 capsule   0   . VERAPAMIL HCL ER 200 MG PO CP24   Oral   Take 1 capsule by mouth daily.           BP 135/49  Pulse 63  Temp 98.6 F (37 C) (Oral)  Resp 11  SpO2 99%  Physical Exam  Nursing note and vitals reviewed. Constitutional: She is oriented to person, place, and time. She appears well-developed and well-nourished. No distress.  HENT:  Head: Normocephalic and atraumatic.  Neck: Normal range of motion. Neck supple.  Cardiovascular: Normal rate and regular rhythm.  Exam reveals no gallop and no friction rub.   No murmur heard. Pulmonary/Chest: Effort normal and breath sounds normal. No respiratory distress. She has no wheezes.  Abdominal: Soft. Bowel sounds are normal. She exhibits no distension. There is no tenderness.  Musculoskeletal: Normal range of motion.  Neurological: She is alert and oriented to person, place, and time.  Skin: Skin is warm and dry. She is not diaphoretic.    ED Course  Procedures (including critical care time)   Labs Reviewed  CBC WITH DIFFERENTIAL  URINALYSIS, ROUTINE W  REFLEX MICROSCOPIC  BASIC METABOLIC PANEL   No results found.   No diagnosis found.    MDM  The patient presents here with blood in the urine.  She has had a stent in the past two weeks and developed a dvt and was put on xarelto.  The urine and labs are okay, including Hb which is as high as it has been in the recent weeks.  I have spoken with Dr. Mena Goes who agrees with my assessment that no further workup or indication is indicated.  She will be discharged to home, follow up Tuesday as scheduled with Urology for stent removal.        Geoffery Lyons, MD 01/27/12 [phone]

## 2012-01-30 LAB — URINE CULTURE

## 2012-05-30 ENCOUNTER — Inpatient Hospital Stay (HOSPITAL_COMMUNITY)
Admission: AD | Admit: 2012-05-30 | Discharge: 2012-06-04 | DRG: 617 | Disposition: A | Discharge status: Home / Self Care | Payer: Medicare PPO | Source: Other Acute Inpatient Hospital | Attending: Internal Medicine | Admitting: Internal Medicine

## 2012-05-30 DIAGNOSIS — N189 Chronic kidney disease, unspecified: Secondary | ICD-10-CM

## 2012-05-30 DIAGNOSIS — L03119 Cellulitis of unspecified part of limb: Secondary | ICD-10-CM

## 2012-05-30 DIAGNOSIS — IMO0002 Reserved for concepts with insufficient information to code with codable children: Principal | ICD-10-CM | POA: Diagnosis present

## 2012-05-30 DIAGNOSIS — M869 Osteomyelitis, unspecified: Secondary | ICD-10-CM

## 2012-05-30 DIAGNOSIS — L97509 Non-pressure chronic ulcer of other part of unspecified foot with unspecified severity: Secondary | ICD-10-CM | POA: Diagnosis present

## 2012-05-30 DIAGNOSIS — M009 Pyogenic arthritis, unspecified: Secondary | ICD-10-CM | POA: Diagnosis present

## 2012-05-30 DIAGNOSIS — Z794 Long term (current) use of insulin: Secondary | ICD-10-CM

## 2012-05-30 DIAGNOSIS — M6240 Contracture of muscle, unspecified site: Secondary | ICD-10-CM | POA: Diagnosis present

## 2012-05-30 DIAGNOSIS — M9080 Osteopathy in diseases classified elsewhere, unspecified site: Secondary | ICD-10-CM | POA: Diagnosis present

## 2012-05-30 DIAGNOSIS — N183 Chronic kidney disease, stage 3 unspecified: Secondary | ICD-10-CM | POA: Diagnosis present

## 2012-05-30 DIAGNOSIS — Z86718 Personal history of other venous thrombosis and embolism: Secondary | ICD-10-CM

## 2012-05-30 DIAGNOSIS — E119 Type 2 diabetes mellitus without complications: Secondary | ICD-10-CM

## 2012-05-30 DIAGNOSIS — L02619 Cutaneous abscess of unspecified foot: Secondary | ICD-10-CM | POA: Diagnosis present

## 2012-05-30 DIAGNOSIS — E1165 Type 2 diabetes mellitus with hyperglycemia: Principal | ICD-10-CM | POA: Diagnosis present

## 2012-05-30 DIAGNOSIS — I129 Hypertensive chronic kidney disease with stage 1 through stage 4 chronic kidney disease, or unspecified chronic kidney disease: Secondary | ICD-10-CM | POA: Diagnosis present

## 2012-05-30 DIAGNOSIS — E11621 Type 2 diabetes mellitus with foot ulcer: Secondary | ICD-10-CM | POA: Diagnosis present

## 2012-05-30 DIAGNOSIS — E785 Hyperlipidemia, unspecified: Secondary | ICD-10-CM

## 2012-05-30 DIAGNOSIS — L03039 Cellulitis of unspecified toe: Secondary | ICD-10-CM | POA: Diagnosis present

## 2012-05-30 DIAGNOSIS — N179 Acute kidney failure, unspecified: Secondary | ICD-10-CM

## 2012-05-31 ENCOUNTER — Encounter (HOSPITAL_COMMUNITY): Payer: Self-pay | Admitting: Internal Medicine

## 2012-05-31 DIAGNOSIS — M869 Osteomyelitis, unspecified: Secondary | ICD-10-CM

## 2012-05-31 DIAGNOSIS — N189 Chronic kidney disease, unspecified: Secondary | ICD-10-CM

## 2012-05-31 DIAGNOSIS — E785 Hyperlipidemia, unspecified: Secondary | ICD-10-CM | POA: Diagnosis present

## 2012-05-31 DIAGNOSIS — L03119 Cellulitis of unspecified part of limb: Secondary | ICD-10-CM

## 2012-05-31 DIAGNOSIS — E119 Type 2 diabetes mellitus without complications: Secondary | ICD-10-CM

## 2012-05-31 DIAGNOSIS — I82409 Acute embolism and thrombosis of unspecified deep veins of unspecified lower extremity: Secondary | ICD-10-CM | POA: Insufficient documentation

## 2012-05-31 DIAGNOSIS — L02619 Cutaneous abscess of unspecified foot: Secondary | ICD-10-CM

## 2012-05-31 LAB — CBC WITH DIFFERENTIAL/PLATELET
Eosinophils Absolute: 0.2 10*3/uL (ref 0.0–0.7)
HCT: 26 % — ABNORMAL LOW (ref 36.0–46.0)
Hemoglobin: 8.7 g/dL — ABNORMAL LOW (ref 12.0–15.0)
Lymphs Abs: 2.2 10*3/uL (ref 0.7–4.0)
MCH: 26.2 pg (ref 26.0–34.0)
MCV: 78.3 fL (ref 78.0–100.0)
Monocytes Absolute: 0.5 10*3/uL (ref 0.1–1.0)
Monocytes Relative: 8 % (ref 3–12)
Neutrophils Relative %: 52 % (ref 43–77)
RBC: 3.32 MIL/uL — ABNORMAL LOW (ref 3.87–5.11)

## 2012-05-31 LAB — BASIC METABOLIC PANEL
CO2: 26 mEq/L (ref 19–32)
Chloride: 103 mEq/L (ref 96–112)
GFR calc non Af Amer: 38 mL/min — ABNORMAL LOW (ref >90)
Glucose, Bld: 119 mg/dL — ABNORMAL HIGH (ref 70–99)
Potassium: 4.4 mEq/L (ref 3.5–5.1)
Sodium: 138 mEq/L (ref 135–145)

## 2012-05-31 LAB — GLUCOSE, CAPILLARY: Glucose-Capillary: 177 mg/dL — ABNORMAL HIGH (ref 70–99)

## 2012-05-31 MED ORDER — VERAPAMIL HCL ER 200 MG PO CP24: 1.0000 | ORAL_CAPSULE | Freq: Every morning | ORAL | Status: DC

## 2012-05-31 MED ORDER — ACETAMINOPHEN 325 MG PO TABS: 650.0000 mg | ORAL_TABLET | Freq: Four times a day (QID) | ORAL | Status: DC | PRN

## 2012-05-31 MED ORDER — HEPARIN SODIUM (PORCINE) 5000 UNIT/ML IJ SOLN
5000.0000 [IU] | Freq: Three times a day (TID) | INTRAMUSCULAR | Status: DC
  Administered 2012-05-31 – 2012-06-01 (×4): 5000 [IU] via SUBCUTANEOUS
  Filled 2012-05-31 (×7): qty 1

## 2012-05-31 MED ORDER — SODIUM CHLORIDE 0.9 % IV SOLN
INTRAVENOUS | Status: DC
  Administered 2012-05-31: 20 mL via INTRAVENOUS

## 2012-05-31 MED ORDER — DEXTROSE 5 % IV SOLN
1.0000 g | Freq: Every day | INTRAVENOUS | Status: DC
  Administered 2012-05-31 – 2012-06-01 (×3): 1 g via INTRAVENOUS
  Filled 2012-05-31 (×4): qty 10

## 2012-05-31 MED ORDER — ONDANSETRON HCL 4 MG PO TABS: 4.0000 mg | ORAL_TABLET | Freq: Four times a day (QID) | ORAL | Status: DC | PRN

## 2012-05-31 MED ORDER — INSULIN ASPART 100 UNIT/ML ~~LOC~~ SOLN
0.0000 [IU] | Freq: Three times a day (TID) | SUBCUTANEOUS | Status: DC
  Administered 2012-05-31 (×3): 2 [IU] via SUBCUTANEOUS
  Administered 2012-06-01: 1 [IU] via SUBCUTANEOUS
  Administered 2012-06-01: 2 [IU] via SUBCUTANEOUS
  Administered 2012-06-01: 1 [IU] via SUBCUTANEOUS
  Administered 2012-06-02 – 2012-06-03 (×5): 2 [IU] via SUBCUTANEOUS
  Administered 2012-06-04: 3 [IU] via SUBCUTANEOUS
  Administered 2012-06-04: 2 [IU] via SUBCUTANEOUS

## 2012-05-31 MED ORDER — VANCOMYCIN HCL IN DEXTROSE 1-5 GM/200ML-% IV SOLN
1000.0000 mg | INTRAVENOUS | Status: DC
  Administered 2012-05-31 – 2012-06-02 (×3): 1000 mg via INTRAVENOUS
  Filled 2012-05-31 (×4): qty 200

## 2012-05-31 MED ORDER — VANCOMYCIN HCL IN DEXTROSE 1-5 GM/200ML-% IV SOLN
1000.0000 mg | Freq: Once | INTRAVENOUS | Status: AC
  Administered 2012-05-31: 1000 mg via INTRAVENOUS
  Filled 2012-05-31: qty 200

## 2012-05-31 MED ORDER — ONDANSETRON HCL 4 MG/2ML IJ SOLN: 4.0000 mg | Freq: Four times a day (QID) | INTRAMUSCULAR | Status: DC | PRN

## 2012-05-31 MED ORDER — ACETAMINOPHEN 650 MG RE SUPP: 650.0000 mg | Freq: Four times a day (QID) | RECTAL | Status: DC | PRN

## 2012-05-31 MED ORDER — ATORVASTATIN CALCIUM 40 MG PO TABS
40.0000 mg | ORAL_TABLET | Freq: Every day | ORAL | Status: DC
  Administered 2012-05-31 – 2012-06-04 (×5): 40 mg via ORAL
  Filled 2012-05-31 (×5): qty 1

## 2012-05-31 MED ORDER — VERAPAMIL HCL ER 180 MG PO TBCR
180.0000 mg | EXTENDED_RELEASE_TABLET | Freq: Every day | ORAL | Status: DC
  Administered 2012-06-01 – 2012-06-04 (×4): 180 mg via ORAL
  Filled 2012-05-31 (×5): qty 1

## 2012-05-31 MED ORDER — OXYCODONE-ACETAMINOPHEN 5-325 MG PO TABS
1.0000 | ORAL_TABLET | ORAL | Status: DC | PRN
  Administered 2012-06-01 (×2): 1 via ORAL
  Filled 2012-05-31 (×2): qty 1

## 2012-05-31 MED ORDER — TAMSULOSIN HCL 0.4 MG PO CAPS
0.4000 mg | ORAL_CAPSULE | Freq: Every day | ORAL | Status: DC
  Administered 2012-05-31 – 2012-06-04 (×5): 0.4 mg via ORAL
  Filled 2012-05-31 (×5): qty 1

## 2012-05-31 MED ORDER — METOPROLOL SUCCINATE ER 50 MG PO TB24
50.0000 mg | ORAL_TABLET | Freq: Every day | ORAL | Status: DC
  Administered 2012-05-31 – 2012-06-04 (×3): 50 mg via ORAL
  Filled 2012-05-31 (×7): qty 1

## 2012-05-31 MED ORDER — SODIUM CHLORIDE 0.9 % IV SOLN
INTRAVENOUS | Status: DC
  Administered 2012-06-01 (×2): via INTRAVENOUS

## 2012-05-31 NOTE — Progress Notes (Signed)
Pt.instructed to have family member bring in Verapamil from home

## 2012-05-31 NOTE — H&P (Addendum)
[facility] History and Physical  Ruth Douglas WJX:[id number] DOB: 11-21-1940 DOA: 05/30/2012  Referring physician: Transferred to [facility] from [place] for further management of osteomyelitis at the request of patient. PCP: Default, Provider, MD  Specialists: None.  Chief Complaint: Left foot pain and swelling.  HPI: Ruth Douglas is a 72 y.o. female history of diabetes mellitus type 2, chronic kidney disease, chronic anemia, hypertension and hyperlipidemia has been having bilateral foot ulcers. Patient has chronic right foot ulcer but last few weeks patient had developed left foot ulcer and has been following with wound team at [facility]. Patient's left foot wound became acutely worse last Friday 5 days ago and was prescribed oral antibiotics by the wound team physician despite which patient was leg became more swollen and painful and had gone to the hospital and was admitted. Wound cultures done at wound center grew staph aureus and group B strep which was sensitive to ceftriaxone. Patient was started on ceftriaxone. MRI showed features consistent with osteomyelitis of the head of the second metatarsal and proximal flanks of the second toe the left foot with possible septic arthritis of the second metatarsal phalangeal joint. There was a possibility of third toe osteomyelitis. MRI also showed features consistent with cellulitis of the left foot. Patient denies any fever chills. Patient denies any shortness of breath nausea vomiting chest pain.  Review of Systems: As presented in the history of presenting illness, rest negative.  Past Medical History  Diagnosis Date  . Renal disorder   . Blood transfusion without reported diagnosis   . Diabetes mellitus without complication   . Hypertension   . CHF (congestive heart failure)    Past Surgical History  Procedure Laterality Date  . Toe amputation    . Cesarean section    . Cystoscopy with stent placement   01/16/2012    Procedure: CYSTOSCOPY WITH STENT PLACEMENT;  Surgeon: Crecencio Mc, MD;  Location: [facility];  Service: Urology;  Laterality: Left;  cysto, retrograde , ureteroscopy , stone extraction with laser lithotripsy, stent placement on left, bladder biopsies with fulgaration  . Holmium laser application  01/16/2012    Procedure: HOLMIUM LASER APPLICATION;  Surgeon: Crecencio Mc, MD;  Location: [facility];  Service: Urology;  Laterality: Left;   Social History:  reports that she has never smoked. She does not have any smokeless tobacco history on file. She reports that she does not drink alcohol or use illicit drugs.  lives at home. where does patient live-- Can do ADLs. Can patient participate in ADLs?  No Known Allergies  Family History  Problem Relation Age of Onset  . Diabetes Mellitus II Mother   . Diabetes Mellitus II Father   . Lung cancer Sister   . Kidney cancer Brother       Prior to Admission medications   Medication Sig Start Date End Date Taking? Authorizing Provider  atorvastatin (LIPITOR) 40 MG tablet Take 40 mg by mouth daily.    Historical Provider, MD  metFORMIN (GLUCOPHAGE) 1000 MG tablet Take 1,000 mg by mouth 2 (two) times daily with a meal.    Historical Provider, MD  metoprolol succinate (TOPROL-XL) 50 MG 24 hr tablet Take 50 mg by mouth daily. Take with or immediately following a meal.    Historical Provider, MD  oxyCODONE-acetaminophen (PERCOCET/ROXICET) 5-325 MG per tablet Take 1-2 tablets by mouth every 4 (four) hours as needed. For pain    Historical Provider, MD  Rivaroxaban (XARELTO) 15 MG TABS tablet Take 1 tablet (  15 mg total) by mouth 2 (two) times daily. 01/21/12   Nicole Pisciotta, PA-C  Tamsulosin HCl (FLOMAX) 0.4 MG CAPS Take 1 capsule (0.4 mg total) by mouth daily. 01/17/12   Dorothea Ogle, MD  Verapamil HCl CR 200 MG CP24 Take 1 capsule by mouth every morning.     Historical Provider, MD   Physical Exam: Filed Vitals:   05/30/12 2300  BP: 169/57   Pulse: 64  Temp: 97.7 F (36.5 C)  TempSrc: Oral  Resp: 18  Height: 5\' 3"  (1.6 m)  Weight: 65.137 kg (143 lb 9.6 oz)  SpO2: 100%     General:  Well-developed well-nourished.  Eyes: Anicteric no pallor.  ENT: No discharge from ears eyes or nose.  Neck: No mass felt.  Cardiovascular: S1-S2 heard.  Respiratory: No rhonchi or crepitations.  Abdomen: Soft nontender bowel sounds present.  Skin: Patient has bilateral foot ulcers.  Musculoskeletal: Patient's left foot looks swollen. But nontender and is able to move at the ankle joint. There is an ulcer on the plantar aspect of the left foot which has active discharge with surrounding erythema. The right foot plantar ulcer is not showing any active discharge.  Psychiatric: Appears normal.  Neurologic: Moves all extremities.  Labs on Admission:  Basic Metabolic Panel: No results found for this basename: NA, K, CL, CO2, GLUCOSE, BUN, CREATININE, CALCIUM, MG, PHOS,  in the last 168 hours Liver Function Tests: No results found for this basename: AST, ALT, ALKPHOS, BILITOT, PROT, ALBUMIN,  in the last 168 hours No results found for this basename: LIPASE, AMYLASE,  in the last 168 hours No results found for this basename: AMMONIA,  in the last 168 hours CBC: No results found for this basename: WBC, NEUTROABS, HGB, HCT, MCV, PLT,  in the last 168 hours Cardiac Enzymes: No results found for this basename: CKTOTAL, CKMB, CKMBINDEX, TROPONINI,  in the last 168 hours  BNP (last 3 results)  Recent Labs  01/15/12 1142 01/21/12 1154  PROBNP 6662.0* 2171.0*   CBG: No results found for this basename: GLUCAP,  in the last 168 hours  Radiological Exams on Admission: No results found.  Labs done at [facility] shows sodium of 133 potassium 4.3 carbon dioxide 27 creatinine 1.5 glucose 250 WBC 6.9 hemoglobin 9.4 platelets 309 sedimentation rate more than 140.  Assessment/Plan Principal Problem:   Osteomyelitis of  foot Active Problems:   DM2 (diabetes mellitus, type 2)   Right foot ulcer   Cellulitis of foot   CKD (chronic kidney disease)   Hyperlipidemia   1. Osteomyelitis of the left second metatarsal and proximal phlanx of the second toe with possible septic arthritis of the second metatarsophalangeal joint and cellulitis of the left foot - for now I have placed patient on vancomycin in addition to ceftriaxone. Consult orthopedics for further recommendations. 2. Diabetes mellitus type 2 uncontrolled - patient's hemoglobin A1c done yesterday at the hospital was 8.8. At this time I have placed patient on sliding-scale coverage may need long-acting insulin. 3. Chronic kidney disease - creatinine appears at baseline. 4. Anemia probably from chronic kidney disease - closely follow CBC. 5. Hypertension - continue present medications. 6. Hyperlipidemia - continue statins. 7. History of DVT - patient states that she had recently stopped xarelto last month.    Code Status: Full code.  Family Communication: None.  Disposition Plan: Admit to inpatient.    Addley Ballinger N. [facility] Pager [phone].  If 7PM-7AM, please contact night-coverage [email or web address] Password [email or web address] 05/31/2012, 1:22  AM Addendum - consulted orthopedic surgeon Dr. Ranell Patrick for further recommendations regarding osteomyelitis.   Midge Minium

## 2012-05-31 NOTE — H&P (Signed)
Pt seen and examined.  Agree with above note by Mr. Durwin Nora.  I reviewed the MRI that the pt brought from [place].  She has an abscess as well as osteo of the 2nd MT and proximal phalanx.  She has a large plantar ulcer under the 2nd and 3rd metatarsal heads.  I explained the nature of this diagnosis to her in detail.  Based on the 2nd MT osteo, extensive abscess and loss of plantar skin, I believe a transmetatarsal amputation is necessary to treat her osteo and allow a wound that will predictably heal.  The risks and benefits of the alternative treatment options have been discussed in detail.  The patient wishes to proceed with surgery and specifically understands risks of bleeding, infection, nerve damage, blood clots, need for additional surgery, amputation and death.

## 2012-05-31 NOTE — Progress Notes (Signed)
[facility] PROGRESS NOTE  Ruth Douglas GMW:[id number] DOB: 07-05-1940 DOA: 05/30/2012 PCP: Default, Provider, MD  Assessment/Plan: 1-Osteomyelitis of the left second metatarsal and proximal phlanx of the second toe with possible septic arthritis of the second metatarsophalangeal joint and cellulitis of the left foot / Abscess- Continue with Vancomycin and ceftriaxone. For transmetatarsal amputation by Dr Victorino Dike.  2-Diabetes mellitus type 2 uncontrolled - patient's hemoglobin A1c done at other  hospital was 8.8. SSI. She will need long acting insulin.  3-Chronic kidney disease - stage 3. Follow Cr.  4-Anemia probably from chronic disease - follow CBC. 5-Hypertension - continue present medications. 6-Hyperlipidemia - continue statins. 7-History of DVT - patient doesn't know for how long she took anticoagulation. Will check dopplers.    Family Communication: care discussed with patient.  Disposition Plan: to be determine.    Consultants:  Dr Victorino Dike  Procedures:  none  Antibiotics:  Vancomycin  Ceftriaxone.   HPI/Subjective: No chest pain, no dyspnea.   Objective: Filed Vitals:   05/30/12 2300 05/31/12 0602  BP: 169/57 123/53  Pulse: 64 61  Temp: 97.7 F (36.5 C) 97.9 F (36.6 C)  TempSrc: Oral Oral  Resp: 18 18  Height: 5\' 3"  (1.6 m)   Weight: 65.137 kg (143 lb 9.6 oz)   SpO2: 100% 97%    Intake/Output Summary (Last 24 hours) at 05/31/12 1059 Last data filed at 05/31/12 0811  Gross per 24 hour  Intake      0 ml  Output    550 ml  Net   -550 ml   Filed Weights   05/30/12 2300  Weight: 65.137 kg (143 lb 9.6 oz)    Exam:   General:  No distress.   Cardiovascular: S 1, S 2 RRR  Respiratory: CTA  Abdomen: BS present, soft, NT  Musculoskeletal: no edema. Left leg with dressing.   Data Reviewed: Basic Metabolic Panel:  Recent Labs Lab 05/31/12 0240  NA 138  K 4.4  CL 103  CO2 26  GLUCOSE 119*  BUN 23  CREATININE 1.37*  CALCIUM  9.0   Liver Function Tests: No results found for this basename: AST, ALT, ALKPHOS, BILITOT, PROT, ALBUMIN,  in the last 168 hours No results found for this basename: LIPASE, AMYLASE,  in the last 168 hours No results found for this basename: AMMONIA,  in the last 168 hours CBC:  Recent Labs Lab 05/31/12 0240  WBC 6.2  NEUTROABS 3.2  HGB 8.7*  HCT 26.0*  MCV 78.3  PLT 305   Cardiac Enzymes: No results found for this basename: CKTOTAL, CKMB, CKMBINDEX, TROPONINI,  in the last 168 hours BNP (last 3 results)  Recent Labs  01/15/12 1142 01/21/12 1154  PROBNP 6662.0* 2171.0*   CBG:  Recent Labs Lab 05/31/12 0801  GLUCAP 177*    No results found for this or any previous visit (from the past 240 hour(s)).   Studies: No results found.  Scheduled Meds: . atorvastatin  40 mg Oral Daily  . cefTRIAXone (ROCEPHIN)  IV  1 g Intravenous QHS  . heparin  5,000 Units Subcutaneous Q8H  . insulin aspart  0-9 Units Subcutaneous TID WC  . metoprolol succinate  50 mg Oral Q breakfast  . tamsulosin  0.4 mg Oral Daily  . vancomycin  1,000 mg Intravenous Q24H  . Verapamil HCl CR  1 capsule Oral q morning - 10a   Continuous Infusions: . sodium chloride 20 mL (05/31/12 0615)    Principal Problem:   Osteomyelitis of  foot Active Problems:   DM2 (diabetes mellitus, type 2)   Right foot ulcer   Cellulitis of foot   CKD (chronic kidney disease)   Hyperlipidemia    Time spent: 25 minutes.     Ruth Douglas  [facility] Pager [phone]. If 7PM-7AM, please contact night-coverage at [email or web address], password [facility] 05/31/2012, 10:59 AM  LOS: 1 day

## 2012-05-31 NOTE — H&P (Signed)
Ruth Douglas is an 72 y.o. female.    Chief Complaint: left foot pain  HPI: 72 y/o female c/o left foot pain for the past week. 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 Pt has been having local debridement in the clinic and then started having increased pain and redness last week. Oral antibiotics were no help. Pt transferred to [facility] for treatment Denies any other symptoms currently  PCP:  Default, Provider, MD  PMH: Past Medical History  Diagnosis Date  . Renal disorder   . Blood transfusion without reported diagnosis   . Diabetes mellitus without complication   . Hypertension   . CHF (congestive heart failure)     PSH: Past Surgical History  Procedure Laterality Date  . Toe amputation    . Cesarean section    . Cystoscopy with stent placement  01/16/2012    Procedure: CYSTOSCOPY WITH STENT PLACEMENT;  Surgeon: Crecencio Mc, MD;  Location: [facility];  Service: Urology;  Laterality: Left;  cysto, retrograde , ureteroscopy , stone extraction with laser lithotripsy, stent placement on left, bladder biopsies with fulgaration  . Holmium laser application  01/16/2012    Procedure: HOLMIUM LASER APPLICATION;  Surgeon: Crecencio Mc, MD;  Location: [facility];  Service: Urology;  Laterality: Left;    Social History:  reports that she has never smoked. She does not have any smokeless tobacco history on file. She reports that she does not drink alcohol or use illicit drugs.  Allergies:  No Known Allergies  Medications: Current Facility-Administered Medications  Medication Dose Route Frequency Provider Last Rate Last Dose  . 0.9 %  sodium chloride infusion   Intravenous Continuous Eduard Clos, MD 20 mL/hr at 05/31/12 0615 20 mL at 05/31/12 0615  . acetaminophen (TYLENOL) tablet 650 mg  650 mg Oral Q6H PRN Eduard Clos, MD        Or  . acetaminophen (TYLENOL) suppository 650 mg  650 mg Rectal Q6H PRN Eduard Clos, MD      . atorvastatin (LIPITOR) tablet 40 mg  40 mg Oral Daily Eduard Clos, MD      . cefTRIAXone (ROCEPHIN) 1 g in dextrose 5 % 50 mL IVPB  1 g Intravenous QHS Eduard Clos, MD   1 g at 05/31/12 0300  . heparin injection 5,000 Units  5,000 Units Subcutaneous Q8H Eduard Clos, MD   5,000 Units at 05/31/12 [phone]  . insulin aspart (novoLOG) injection 0-9 Units  0-9 Units Subcutaneous TID WC Eduard Clos, MD      . metoprolol succinate (TOPROL-XL) 24 hr tablet 50 mg  50 mg Oral Q breakfast Eduard Clos, MD      . ondansetron [facility]) tablet 4 mg  4 mg Oral Q6H PRN Eduard Clos, MD       Or  . ondansetron (ZOFRAN) injection 4 mg  4 mg Intravenous Q6H PRN Eduard Clos, MD      . oxyCODONE-acetaminophen (PERCOCET/ROXICET) 5-325 MG per tablet 1-2 tablet  1-2 tablet Oral Q4H PRN Eduard Clos, MD      . tamsulosin (FLOMAX) capsule 0.4 mg  0.4 mg Oral Daily Eduard Clos, MD      . vancomycin (VANCOCIN) IVPB 1000 mg/200 mL premix  1,000 mg Intravenous Q24H Alba Cory, MD      .  Verapamil HCl CR CP24 200 mg  1 capsule Oral q morning - 10a Eduard Clos, MD        Results for orders placed during the hospital encounter of 05/30/12 (from the past 48 hour(s))  BASIC METABOLIC PANEL     Status: Abnormal   Collection Time    05/31/12  2:40 AM      Result Value Range   Sodium 138  135 - 145 mEq/L   Potassium 4.4  3.5 - 5.1 mEq/L   Chloride 103  96 - 112 mEq/L   CO2 26  19 - 32 mEq/L   Glucose, Bld 119 (*) 70 - 99 mg/dL   BUN 23  6 - 23 mg/dL   Creatinine, Ser 3.24 (*) 0.50 - 1.10 mg/dL   Calcium 9.0  8.4 - 40.1 mg/dL   GFR calc non Af Amer 38 (*) >90 mL/min   GFR calc Af Amer 44 (*) >90 mL/min   Comment:            The eGFR has been calculated     using the CKD EPI equation.     This calculation has not been     validated in  all clinical     situations.     eGFR's persistently     <90 mL/min signify     possible Chronic Kidney Disease.  CBC WITH DIFFERENTIAL     Status: Abnormal   Collection Time    05/31/12  2:40 AM      Result Value Range   WBC 6.2  4.0 - 10.5 K/uL   RBC 3.32 (*) 3.87 - 5.11 MIL/uL   Hemoglobin 8.7 (*) 12.0 - 15.0 g/dL   HCT 02.7 (*) 25.3 - 66.4 %   MCV 78.3  78.0 - 100.0 fL   MCH 26.2  26.0 - 34.0 pg   MCHC 33.5  30.0 - 36.0 g/dL   RDW 40.3  47.4 - 25.9 %   Platelets 305  150 - 400 K/uL   Neutrophils Relative 52  43 - 77 %   Neutro Abs 3.2  1.7 - 7.7 K/uL   Lymphocytes Relative 36  12 - 46 %   Lymphs Abs 2.2  0.7 - 4.0 K/uL   Monocytes Relative 8  3 - 12 %   Monocytes Absolute 0.5  0.1 - 1.0 K/uL   Eosinophils Relative 3  0 - 5 %   Eosinophils Absolute 0.2  0.0 - 0.7 K/uL   Basophils Relative 0  0 - 1 %   Basophils Absolute 0.0  0.0 - 0.1 K/uL  GLUCOSE, CAPILLARY     Status: Abnormal   Collection Time    05/31/12  8:01 AM      Result Value Range   Glucose-Capillary 177 (*) 70 - 99 mg/dL   Comment 1 Notify RN     No results found.  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 Moderate pain to left foot with ulcer to plantar aspect Denies any recent chills fevers or illnesses  Physical Exam: Alert and appropriate 72 y/o female in no acute distress Left foot: minimal erythema to 2nd toe and web spacing Mild edema nv intact distally 2 cm circular ulcer to plantar aspect of the left foot Minimal drainage from left foot   MRI: report from medical team states osteomyelitis to 2nd and probably 3rd metatarsal head of left foot  Assessment/Plan Assessment: osteomyelitis left foot  Plan: Due to the osteomyelitis and increasing pain  to left foot, patient will need surgical management Will try to obtain the images of the scan and allow Dr. Victorino Dike to review and discuss which type of amputation and how much of the foot will need to be amputated to allow the osteo to  resolve Continue IV antibiotics  Pain control as needed Weight bearing on heel only    @MSBSIG @

## 2012-05-31 NOTE — Progress Notes (Signed)
ANTIBIOTIC CONSULT NOTE - INITIAL  Pharmacy Consult for vancomycin  Indication: R/o osteomyelitis  No Known Allergies  Patient Measurements: Height: 5\' 3"  (160 cm) Weight: 143 lb 9.6 oz (65.137 kg) IBW/kg (Calculated) : 52.4  Vital Signs: Temp: 97.7 F (36.5 C) (04/02 2300) Temp src: Oral (04/02 2300) BP: 169/57 mmHg (04/02 2300) Pulse Rate: 64 (04/02 2300) Intake/Output from previous day:   Intake/Output from this shift:    Labs: No results found for this basename: WBC, HGB, PLT, LABCREA, CREATININE,  in the last 72 hours Estimated Creatinine Clearance: 37.1 ml/min (by C-G formula based on Cr of 1.3). No results found for this basename: VANCOTROUGH, VANCOPEAK, VANCORANDOM, GENTTROUGH, GENTPEAK, GENTRANDOM, TOBRATROUGH, TOBRAPEAK, TOBRARND, AMIKACINPEAK, AMIKACINTROU, AMIKACIN,  in the last 72 hours   Microbiology: No results found for this or any previous visit (from the past 720 hour(s)).  Medical History: Past Medical History  Diagnosis Date  . Renal disorder   . Blood transfusion without reported diagnosis   . Diabetes mellitus without complication   . Hypertension   . CHF (congestive heart failure)     Medications:  Scheduled:  . atorvastatin  40 mg Oral Daily  . cefTRIAXone (ROCEPHIN)  IV  1 g Intravenous QHS  . heparin  5,000 Units Subcutaneous Q8H  . insulin aspart  0-9 Units Subcutaneous TID WC  . metoprolol succinate  50 mg Oral Q breakfast  . tamsulosin  0.4 mg Oral Daily  . Verapamil HCl CR  1 capsule Oral q morning - 10a   Assessment: 72 yo female transferred from outside hospital for r/o osteomyelitis. Pharmacy to manage vancomycin. Patient is also to receive ceftriaxone. Serum creatinine of 1.33 mg/dL from 4/2 AM labs at outside hospital.   Goal of Therapy:  Vancomycin trough level 15-20 mcg/ml  Plan:  1. Vancomycin 1gm IV x 1, then 1 gm IV Q24H starting at 18:00  Emeline Gins 05/31/2012,2:20 AM

## 2012-06-01 ENCOUNTER — Encounter (HOSPITAL_COMMUNITY): Payer: Self-pay | Admitting: Anesthesiology

## 2012-06-01 ENCOUNTER — Inpatient Hospital Stay (HOSPITAL_COMMUNITY): Payer: Medicare PPO | Admitting: Anesthesiology

## 2012-06-01 ENCOUNTER — Encounter (HOSPITAL_COMMUNITY)
Admission: AD | Disposition: A | Discharge status: Home / Self Care | Payer: Self-pay | Source: Other Acute Inpatient Hospital | Attending: Internal Medicine

## 2012-06-01 DIAGNOSIS — E785 Hyperlipidemia, unspecified: Secondary | ICD-10-CM

## 2012-06-01 HISTORY — PX: ACHILLES TENDON SURGERY: SHX542

## 2012-06-01 HISTORY — PX: AMPUTATION: SHX166

## 2012-06-01 LAB — CBC
HCT: 24.6 % — ABNORMAL LOW (ref 36.0–46.0)
Hemoglobin: 8.6 g/dL — ABNORMAL LOW (ref 12.0–15.0)
Hemoglobin: 8.2 g/dL — ABNORMAL LOW (ref 12.0–15.0)
MCH: 26.3 pg (ref 26.0–34.0)
MCHC: 33.7 g/dL (ref 30.0–36.0)
MCHC: 33.3 g/dL (ref 30.0–36.0)
RBC: 3.21 MIL/uL — ABNORMAL LOW (ref 3.87–5.11)

## 2012-06-01 LAB — BASIC METABOLIC PANEL WITH GFR
BUN: 18 mg/dL (ref 6–23)
CO2: 27 meq/L (ref 19–32)
Calcium: 9.1 mg/dL (ref 8.4–10.5)
Chloride: 104 meq/L (ref 96–112)
Creatinine, Ser: 1.19 mg/dL — ABNORMAL HIGH (ref 0.50–1.10)
GFR calc Af Amer: 53 mL/min — ABNORMAL LOW (ref >90)
GFR calc non Af Amer: 45 mL/min — ABNORMAL LOW (ref >90)
Glucose, Bld: 209 mg/dL — ABNORMAL HIGH (ref 70–99)
Potassium: 4.4 meq/L (ref 3.5–5.1)
Sodium: 139 meq/L (ref 135–145)

## 2012-06-01 LAB — GLUCOSE, CAPILLARY
Glucose-Capillary: 203 mg/dL — ABNORMAL HIGH (ref 70–99)
Glucose-Capillary: 185 mg/dL — ABNORMAL HIGH (ref 70–99)
Glucose-Capillary: 134 mg/dL — ABNORMAL HIGH (ref 70–99)
Glucose-Capillary: 150 mg/dL — ABNORMAL HIGH (ref 70–99)

## 2012-06-01 LAB — CREATININE, SERUM: GFR calc non Af Amer: 51 mL/min — ABNORMAL LOW (ref >90)

## 2012-06-01 LAB — SURGICAL PCR SCREEN
MRSA, PCR: NEGATIVE
Staphylococcus aureus: NEGATIVE

## 2012-06-01 SURGERY — AMPUTATION, FOOT, PARTIAL
Anesthesia: General | Site: Foot | Laterality: Left | Wound class: Dirty or Infected

## 2012-06-01 MED ORDER — GLYCOPYRROLATE 0.2 MG/ML IJ SOLN
INTRAMUSCULAR | Status: DC | PRN
  Administered 2012-06-01: 0.2 mg via INTRAVENOUS

## 2012-06-01 MED ORDER — OXYCODONE HCL 5 MG PO TABS: 5.0000 mg | ORAL_TABLET | Freq: Once | ORAL | Status: AC | PRN

## 2012-06-01 MED ORDER — ARTIFICIAL TEARS OP OINT
TOPICAL_OINTMENT | OPHTHALMIC | Status: DC | PRN
  Administered 2012-06-01: 1 via OPHTHALMIC

## 2012-06-01 MED ORDER — 0.9 % SODIUM CHLORIDE (POUR BTL) OPTIME
TOPICAL | Status: DC | PRN
  Administered 2012-06-01: 1000 mL

## 2012-06-01 MED ORDER — OXYCODONE HCL 5 MG/5ML PO SOLN
5.0000 mg | Freq: Once | ORAL | Status: AC | PRN
Start: 2012-06-01 — End: 2012-06-01

## 2012-06-01 MED ORDER — EPHEDRINE SULFATE 50 MG/ML IJ SOLN
INTRAMUSCULAR | Status: DC | PRN
  Administered 2012-06-01 (×3): 10 mg via INTRAVENOUS

## 2012-06-01 MED ORDER — LACTATED RINGERS IV SOLN
INTRAVENOUS | Status: DC
  Administered 2012-06-01: 13:00:00 via INTRAVENOUS

## 2012-06-01 MED ORDER — MIDAZOLAM HCL 5 MG/5ML IJ SOLN
INTRAMUSCULAR | Status: DC | PRN
  Administered 2012-06-01: 2 mg via INTRAVENOUS

## 2012-06-01 MED ORDER — PHENYLEPHRINE HCL 10 MG/ML IJ SOLN
INTRAMUSCULAR | Status: DC | PRN
  Administered 2012-06-01: 80 ug via INTRAVENOUS

## 2012-06-01 MED ORDER — FENTANYL CITRATE 0.05 MG/ML IJ SOLN
INTRAMUSCULAR | Status: DC | PRN
  Administered 2012-06-01: 100 ug via INTRAVENOUS

## 2012-06-01 MED ORDER — MEPERIDINE HCL 25 MG/ML IJ SOLN: 6.2500 mg | INTRAMUSCULAR | Status: DC | PRN

## 2012-06-01 MED ORDER — HYDROMORPHONE HCL PF 1 MG/ML IJ SOLN: 0.2500 mg | INTRAMUSCULAR | Status: DC | PRN

## 2012-06-01 MED ORDER — LIDOCAINE HCL (CARDIAC) 20 MG/ML IV SOLN
INTRAVENOUS | Status: DC | PRN
  Administered 2012-06-01: 100 mg via INTRAVENOUS

## 2012-06-01 MED ORDER — ONDANSETRON HCL 4 MG/2ML IJ SOLN
INTRAMUSCULAR | Status: DC | PRN
  Administered 2012-06-01: 4 mg via INTRAVENOUS

## 2012-06-01 MED ORDER — PROPOFOL 10 MG/ML IV BOLUS
INTRAVENOUS | Status: DC | PRN
  Administered 2012-06-01: 100 mg via INTRAVENOUS

## 2012-06-01 MED ORDER — ONDANSETRON HCL 4 MG/2ML IJ SOLN: 4.0000 mg | Freq: Once | INTRAMUSCULAR | Status: AC | PRN

## 2012-06-01 MED ORDER — SODIUM CHLORIDE 0.9 % IV SOLN
INTRAVENOUS | Status: DC | PRN
  Administered 2012-06-01: 15:00:00 via INTRAVENOUS

## 2012-06-01 MED ORDER — ENOXAPARIN SODIUM 40 MG/0.4ML ~~LOC~~ SOLN
40.0000 mg | SUBCUTANEOUS | Status: DC
  Administered 2012-06-01 – 2012-06-03 (×3): 40 mg via SUBCUTANEOUS
  Filled 2012-06-01 (×3): qty 0.4

## 2012-06-01 MED ORDER — LACTATED RINGERS IV SOLN
INTRAVENOUS | Status: DC | PRN
  Administered 2012-06-01: 13:00:00 via INTRAVENOUS

## 2012-06-01 SURGICAL SUPPLY — 57 items
BANDAGE ELASTIC 4 VELCRO ST LF (GAUZE/BANDAGES/DRESSINGS) ×2 IMPLANT
BANDAGE ELASTIC 6 VELCRO ST LF (GAUZE/BANDAGES/DRESSINGS) ×2 IMPLANT
BANDAGE ESMARK 6X9 LF (GAUZE/BANDAGES/DRESSINGS) ×1 IMPLANT
BLADE SAW SGTL MED 73X18.5 STR (BLADE) ×1 IMPLANT
BLADE SURG 10 STRL SS (BLADE) ×2 IMPLANT
BNDG CMPR 9X4 STRL LF SNTH (GAUZE/BANDAGES/DRESSINGS) ×1
BNDG CMPR 9X6 STRL LF SNTH (GAUZE/BANDAGES/DRESSINGS) ×1
BNDG COHESIVE 4X5 TAN STRL (GAUZE/BANDAGES/DRESSINGS) ×2 IMPLANT
BNDG COHESIVE 6X5 TAN STRL LF (GAUZE/BANDAGES/DRESSINGS) ×2 IMPLANT
BNDG ESMARK 4X9 LF (GAUZE/BANDAGES/DRESSINGS) ×2 IMPLANT
BNDG ESMARK 6X9 LF (GAUZE/BANDAGES/DRESSINGS) ×2
CHLORAPREP W/TINT 26ML (MISCELLANEOUS) ×2 IMPLANT
CLOTH BEACON ORANGE TIMEOUT ST (SAFETY) ×2 IMPLANT
COVER SURGICAL LIGHT HANDLE (MISCELLANEOUS) ×2 IMPLANT
CUFF TOURNIQUET SINGLE 34IN LL (TOURNIQUET CUFF) ×2 IMPLANT
CUFF TOURNIQUET SINGLE 44IN (TOURNIQUET CUFF) IMPLANT
DRAPE U-SHAPE 47X51 STRL (DRAPES) ×4 IMPLANT
DRSG ADAPTIC 3X8 NADH LF (GAUZE/BANDAGES/DRESSINGS) ×2 IMPLANT
DRSG PAD ABDOMINAL 8X10 ST (GAUZE/BANDAGES/DRESSINGS) ×2 IMPLANT
DURAPREP 26ML APPLICATOR (WOUND CARE) ×2 IMPLANT
ELECT REM PT RETURN 9FT ADLT (ELECTROSURGICAL) ×2
ELECTRODE REM PT RTRN 9FT ADLT (ELECTROSURGICAL) ×1 IMPLANT
GLOVE BIO SURGEON STRL SZ8 (GLOVE) ×4 IMPLANT
GLOVE BIOGEL PI IND STRL 8 (GLOVE) ×1 IMPLANT
GLOVE BIOGEL PI INDICATOR 8 (GLOVE) ×1
GOWN PREVENTION PLUS XLARGE (GOWN DISPOSABLE) ×2 IMPLANT
GOWN STRL NON-REIN LRG LVL3 (GOWN DISPOSABLE) ×4 IMPLANT
KIT BASIN OR (CUSTOM PROCEDURE TRAY) ×2 IMPLANT
KIT ROOM TURNOVER OR (KITS) ×2 IMPLANT
MANIFOLD NEPTUNE II (INSTRUMENTS) ×2 IMPLANT
NEEDLE 22X1 1/2 (OR ONLY) (NEEDLE) ×1 IMPLANT
NS IRRIG 1000ML POUR BTL (IV SOLUTION) ×2 IMPLANT
PACK ORTHO EXTREMITY (CUSTOM PROCEDURE TRAY) ×2 IMPLANT
PAD ARMBOARD 7.5X6 YLW CONV (MISCELLANEOUS) ×4 IMPLANT
PAD CAST 4YDX4 CTTN HI CHSV (CAST SUPPLIES) ×1 IMPLANT
PADDING CAST ABS 6INX4YD NS (CAST SUPPLIES) ×1
PADDING CAST ABS COTTON 6X4 NS (CAST SUPPLIES) ×1 IMPLANT
PADDING CAST COTTON 4X4 STRL (CAST SUPPLIES) ×2
PADDING CAST COTTON 6X4 STRL (CAST SUPPLIES) ×1 IMPLANT
SOAP 2 % CHG 4 OZ (WOUND CARE) ×2 IMPLANT
SPLINT PLASTER CAST XFAST 5X30 (CAST SUPPLIES) IMPLANT
SPLINT PLASTER XFAST SET 5X30 (CAST SUPPLIES) ×1
SPONGE GAUZE 4X4 12PLY (GAUZE/BANDAGES/DRESSINGS) ×2 IMPLANT
SPONGE LAP 18X18 X RAY DECT (DISPOSABLE) ×2 IMPLANT
STOCKINETTE IMPERVIOUS LG (DRAPES) IMPLANT
SUCTION FRAZIER TIP 10 FR DISP (SUCTIONS) ×2 IMPLANT
SUT ETHILON 2 0 PSLX (SUTURE) ×4 IMPLANT
SUT MNCRL AB 3-0 PS2 18 (SUTURE) ×2 IMPLANT
SUT PROLENE 3 0 PS 2 (SUTURE) IMPLANT
SUT VIC AB 3-0 PS2 18 (SUTURE) ×2
SUT VIC AB 3-0 PS2 18XBRD (SUTURE) ×1 IMPLANT
SYR CONTROL 10ML LL (SYRINGE) ×1 IMPLANT
TOWEL OR 17X24 6PK STRL BLUE (TOWEL DISPOSABLE) ×2 IMPLANT
TOWEL OR 17X26 10 PK STRL BLUE (TOWEL DISPOSABLE) ×2 IMPLANT
TUBE CONNECTING 12X1/4 (SUCTIONS) ×2 IMPLANT
UNDERPAD 30X30 INCONTINENT (UNDERPADS AND DIAPERS) ×2 IMPLANT
WATER STERILE IRR 1000ML POUR (IV SOLUTION) ×2 IMPLANT

## 2012-06-01 NOTE — Anesthesia Postprocedure Evaluation (Signed)
Anesthesia Post Note  Patient: Ruth Douglas  Procedure(s) Performed: Procedure(s) (LRB): LEFT FOOT TRANSMETATARSEL AMPUTATION (Left) PERCUTANEOUS TENDON ACHILLES LENGTHING (Left)  Anesthesia type: general  Patient location: PACU  Post pain: Pain level controlled  Post assessment: Patient's Cardiovascular Status Stable  Last Vitals:  Filed Vitals:   06/01/12 1530  BP: 135/47  Pulse: 77  Temp:   Resp: 16    Post vital signs: Reviewed and stable  Level of consciousness: sedated  Complications: No apparent anesthesia complications

## 2012-06-01 NOTE — Preoperative (Signed)
Beta Blockers   Reason not to administer Beta Blockers:Not Applicable 

## 2012-06-01 NOTE — Brief Op Note (Signed)
05/30/2012 - 06/01/2012  2:44 PM  PATIENT:  Ruth Douglas  72 y.o. female  PRE-OPERATIVE DIAGNOSIS:  left foot osteomyelitis and abscess  POST-OPERATIVE DIAGNOSIS:  left foot osteomyelitis and abscess  Procedure(s): LEFT FOOT TRANSMETATARSEL AMPUTATION PERCUTANEOUS TENDON ACHILLES LENGTHING  SURGEON:  Toni Arthurs, MD  ASSISTANT: n/a  ANESTHESIA:   General  EBL:  minimal   TOURNIQUET:   Total Tourniquet Time Documented: Thigh (Left) - 10 minutes Total: Thigh (Left) - 10 minutes   COMPLICATIONS:  None apparent  DISPOSITION:  Extubated, awake and stable to recovery.  DICTATION ID:  [id number]

## 2012-06-01 NOTE — Progress Notes (Signed)
[facility] PROGRESS NOTE  Ruth Douglas:[id number] DOB: [birth year] DOA: 05/30/2012 PCP: Default, Provider, MD  Assessment/Plan: 1-Osteomyelitis of the left second metatarsal and proximal phlanx of the second toe with possible septic arthritis of the second metatarsophalangeal joint and cellulitis of the left foot / Abscess- Continue with Vancomycin and ceftriaxone day 2. For transmetatarsal amputation by Dr Victorino Dike.  2-Diabetes mellitus type 2 uncontrolled - patient's hemoglobin A1c done at other  hospital was 8.8. SSI. She will need long acting insulin.  3-Chronic kidney disease - stage 3. Follow Cr. B-et pending for this morning.  4-Anemia probably from chronic disease - follow CBC. 5-Hypertension - continue present medications. 6-Hyperlipidemia - continue statins. 7-History of DVT - patient doesn't know for how long she took anticoagulation. Dopplers discontinue by Dr Victorino Dike.    Family Communication: care discussed with patient.  Disposition Plan: to be determine.    Consultants:  Dr Victorino Dike  Procedures:  none  Antibiotics:  Vancomycin  Ceftriaxone.   HPI/Subjective: No chest pain, no dyspnea. Feeling ok.   Objective: Filed Vitals:   05/31/12 0602 05/31/12 1431 05/31/12 2123 06/01/12 0601  BP: 123/53 118/48 147/56 167/53  Pulse: 61 58 62 63  Temp: 97.9 F (36.6 C) 98.1 F (36.7 C) 98.3 F (36.8 C) 97.8 F (36.6 C)  TempSrc: Oral Oral Oral Oral  Resp: 18 16 18 18   Height:      Weight:      SpO2: 97% 100% 97% 98%    Intake/Output Summary (Last 24 hours) at 06/01/12 0840 Last data filed at 06/01/12 0603  Gross per 24 hour  Intake 869.67 ml  Output    300 ml  Net 569.67 ml   Filed Weights   05/30/12 2300  Weight: 65.137 kg (143 lb 9.6 oz)    Exam:   General:  No distress.   Cardiovascular: S 1, S 2 RRR  Respiratory: CTA  Abdomen: BS present, soft, NT  Musculoskeletal: no edema. Left leg with dressing.   Data Reviewed: Basic  Metabolic Panel:  Recent Labs Lab 05/31/12 0240  NA 138  K 4.4  CL 103  CO2 26  GLUCOSE 119*  BUN 23  CREATININE 1.37*  CALCIUM 9.0   Liver Function Tests: No results found for this basename: AST, ALT, ALKPHOS, BILITOT, PROT, ALBUMIN,  in the last 168 hours No results found for this basename: LIPASE, AMYLASE,  in the last 168 hours No results found for this basename: AMMONIA,  in the last 168 hours CBC:  Recent Labs Lab 05/31/12 0240  WBC 6.2  NEUTROABS 3.2  HGB 8.7*  HCT 26.0*  MCV 78.3  PLT 305   Cardiac Enzymes: No results found for this basename: CKTOTAL, CKMB, CKMBINDEX, TROPONINI,  in the last 168 hours BNP (last 3 results)  Recent Labs  01/15/12 1142 01/21/12 1154  PROBNP 6662.0* 2171.0*   CBG:  Recent Labs Lab 05/31/12 0801 05/31/12 1209 05/31/12 1654 05/31/12 2129 06/01/12 0752  GLUCAP 177* 190* 185* 203* 185*    No results found for this or any previous visit (from the past 240 hour(s)).   Studies: No results found.  Scheduled Meds: . atorvastatin  40 mg Oral Daily  . cefTRIAXone (ROCEPHIN)  IV  1 g Intravenous QHS  . heparin  5,000 Units Subcutaneous Q8H  . insulin aspart  0-9 Units Subcutaneous TID WC  . metoprolol succinate  50 mg Oral Q breakfast  . tamsulosin  0.4 mg Oral Daily  . vancomycin  1,000 mg Intravenous  Q24H  . verapamil  180 mg Oral Daily   Continuous Infusions: . sodium chloride 20 mL (05/31/12 0615)  . sodium chloride 100 mL/hr at 06/01/12 0000    Principal Problem:   Osteomyelitis of foot Active Problems:   DM2 (diabetes mellitus, type 2)   Right foot ulcer   Cellulitis of foot   CKD (chronic kidney disease)   Hyperlipidemia    Time spent: 25 minutes.     Ruth Douglas  [facility] Pager [phone]. If 7PM-7AM, please contact night-coverage at [email or web address], password [facility] 06/01/2012, 8:40 AM  LOS: 2 days

## 2012-06-01 NOTE — Anesthesia Preprocedure Evaluation (Signed)
Anesthesia Evaluation  Patient identified by MRN, date of birth, ID band Patient awake    Reviewed: Allergy & Precautions, H&P , NPO status , Patient's Chart, lab work & pertinent test results  Airway Mallampati: I TM Distance: >3 FB Neck ROM: Full    Dental   Pulmonary shortness of breath,          Cardiovascular hypertension, Pt. on medications     Neuro/Psych    GI/Hepatic   Endo/Other    Renal/GU Renal InsufficiencyRenal disease     Musculoskeletal   Abdominal   Peds  Hematology   Anesthesia Other Findings   Reproductive/Obstetrics                           Anesthesia Physical Anesthesia Plan  ASA: III  Anesthesia Plan: General   Post-op Pain Management:    Induction: Intravenous  Airway Management Planned: LMA  Additional Equipment:   Intra-op Plan:   Post-operative Plan: Extubation in OR  Informed Consent:   Plan Discussed with: CRNA and Surgeon  Anesthesia Plan Comments:         Anesthesia Quick Evaluation

## 2012-06-01 NOTE — Transfer of Care (Signed)
Immediate Anesthesia Transfer of Care Note  Patient: Ruth Douglas  Procedure(s) Performed: Procedure(s): LEFT FOOT TRANSMETATARSEL AMPUTATION (Left) PERCUTANEOUS TENDON ACHILLES LENGTHING (Left)  Patient Location: PACU  Anesthesia Type:General  Level of Consciousness: awake, alert , oriented and sedated  Airway & Oxygen Therapy: Patient Spontanous Breathing and Patient connected to nasal cannula oxygen  Post-op Assessment: Report given to PACU RN, Post -op Vital signs reviewed and stable and Patient moving all extremities  Post vital signs: Reviewed and stable  Complications: No apparent anesthesia complications

## 2012-06-02 DIAGNOSIS — N179 Acute kidney failure, unspecified: Secondary | ICD-10-CM

## 2012-06-02 LAB — BASIC METABOLIC PANEL
CO2: 28 mEq/L (ref 19–32)
Calcium: 8.6 mg/dL (ref 8.4–10.5)
Chloride: 101 mEq/L (ref 96–112)
Glucose, Bld: 193 mg/dL — ABNORMAL HIGH (ref 70–99)
Sodium: 136 mEq/L (ref 135–145)

## 2012-06-02 LAB — CBC
Hemoglobin: 7.7 g/dL — ABNORMAL LOW (ref 12.0–15.0)
MCH: 26.2 pg (ref 26.0–34.0)
RBC: 2.94 MIL/uL — ABNORMAL LOW (ref 3.87–5.11)

## 2012-06-02 LAB — GLUCOSE, CAPILLARY
Glucose-Capillary: 162 mg/dL — ABNORMAL HIGH (ref 70–99)
Glucose-Capillary: 174 mg/dL — ABNORMAL HIGH (ref 70–99)

## 2012-06-02 MED ORDER — INSULIN GLARGINE 100 UNIT/ML ~~LOC~~ SOLN
10.0000 [IU] | Freq: Every day | SUBCUTANEOUS | Status: DC
  Administered 2012-06-02 – 2012-06-03 (×2): 10 [IU] via SUBCUTANEOUS
  Filled 2012-06-02 (×3): qty 0.1

## 2012-06-02 NOTE — Evaluation (Signed)
Occupational Therapy Evaluation and Discharge Patient Details Name: Ruth Douglas MRN: [id number] DOB: 1941-02-01 Today's Date: 06/02/2012 Time: 4098-1191 OT Time Calculation (min): 18 min  OT Assessment / Plan / Recommendation Clinical Impression  This 72 yo female s/p left transmetarsal amputation (old 1st and 2nd digit amputations on RLE) presents to acute OT with all education completed and will have someone with her 24/7. Acute OT will sign off.    OT Assessment  Patient does not need any further OT services    Follow Up Recommendations  No OT follow up       Equipment Recommendations  3 in 1 bedside comode (and RW--if cannot get from sister's house)          Precautions / Restrictions Precautions Precautions: Fall Restrictions Weight Bearing Restrictions: Yes LLE Weight Bearing: Non weight bearing Other Position/Activity Restrictions: Weight bearing on heel only       ADL  Equipment Used: Rolling walker Transfers/Ambulation Related to ADLs: Min guard A for all with RW ADL Comments: set up/S with minguard A when standing for all BADLs        Visit Information  Last OT Received On: 06/02/12 Assistance Needed: +1       Prior Functioning     Home Living Lives With: Spouse Available Help at Discharge: Family;Available 24 hours/day Type of Home: House Home Access: Ramped entrance Home Layout: One level Bathroom Shower/Tub: Walk-in shower (sponge baths for now) Firefighter: Standard Bathroom Accessibility: Yes How Accessible: Accessible via wheelchair;Accessible via walker Home Adaptive Equipment: Walker - standard;Straight cane;Wheelchair - manual Additional Comments: May have a RW and 3n1 at sisters house, husband will check today Prior Function Level of Independence: Independent Able to Take Stairs?: Yes Vocation: Retired Musician: No difficulties Dominant Hand: Right         Vision/Perception Vision -  History Baseline Vision: No visual deficits   Cognition  Cognition Overall Cognitive Status: Appears within functional limits for tasks assessed/performed Arousal/Alertness: Awake/alert Orientation Level: Appears intact for tasks assessed Behavior During Session: [facility] for tasks performed    Extremity/Trunk Assessment Right Upper Extremity Assessment RUE ROM/Strength/Tone: Within functional levels Left Upper Extremity Assessment LUE ROM/Strength/Tone: Within functional levels     Mobility Bed Mobility Bed Mobility: Supine to Sit Supine to Sit: 6: Modified independent (Device/Increase time);HOB elevated Transfers Transfers: Sit to Stand;Stand to Sit Sit to Stand: 4: Min guard;With upper extremity assist;From bed Stand to Sit: 4: Min guard;With upper extremity assist;With armrests;To chair/3-in-1 Details for Transfer Assistance: VC/s for safe hand placement           End of Session OT - End of Session Activity Tolerance: Patient tolerated treatment well Patient left: in chair;with call bell/phone within reach       Evette Georges [phone] 06/02/2012, 10:12 AM

## 2012-06-02 NOTE — Op Note (Signed)
NAMECHARMAYNE, ODELL NO.:  [id number]  MEDICAL RECORD NO.:  [id number]  LOCATION:  [id number]                        FACILITY:  [facility]  PHYSICIAN:  Toni Arthurs, MD        DATE OF BIRTH:  1940-12-03  DATE OF PROCEDURE:  06/01/2012 DATE OF DISCHARGE:                              OPERATIVE REPORT   PREOPERATIVE DIAGNOSIS:  Left plantar foot diabetic ulcer with abscess and osteomyelitis.  POSTOPERATIVE DIAGNOSIS:  Left plantar foot diabetic ulcer with abscess and osteomyelitis.  PROCEDURE: 1. Left foot transmetatarsal amputation. 2. Left percutaneous tendo-Achilles lengthening.  SURGEON:  Toni Arthurs, MD.  ANESTHESIA:  General.  ESTIMATED BLOOD LOSS:  Minimal.  TOURNIQUET TIME:  10 minutes at 250 mmHg.  COMPLICATIONS:  None apparent.  DISPOSITION:  Extubated, awake, and stable to recovery.  SPECIMENS:  Deep tissue to microbiology for aerobic and anaerobic culture, left forefoot to Pathology.  INDICATIONS FOR PROCEDURE:  The patient is a 72 year old woman with a past medical history significant for diabetes.  She is status post amputation of 2 toes on the right foot.  She has developed an ulcer on the plantar aspect of the left foot that became cellulitic.  An MRI was obtained showing an abscess at the left plantar foot tissue as well as osteomyelitis of the second metatarsal and 2nd and 3rd toes.  She presents now for operative treatment of this condition.  She understands the risks and benefits, the alternative treatment options and elects transmetatarsal amputation.  She has also noted on physical exam to have a tight heel cord and requires tendo-Achilles lengthening.  She understands the risks and benefits, the alternative treatment options and elects surgical treatment.  She specifically understands risks of bleeding, infection, nerve damage, blood clots, need for additional surgery, revision amputation, and death.  PROCEDURE IN DETAIL:  After  preoperative consent was obtained, the correct operative site was identified.  The patient was brought to the operating room and placed supine on the operating table.  General anesthesia was induced.  Preoperative antibiotics were administered. Surgical time-out was taken.  Left lower extremity was prepped and draped in standard sterile fashion with tourniquet around the thigh. The extremity was exsanguinated and tourniquet was inflated to 250 mmHg. A percutaneous triple hemisection Achilles tendon lengthening was then performed with a #15 blade.  The ankle would then dorsiflex 30-degrees with the knee extended.  Attention was then turned to the forefoot where a fishmouth incision was marked on the skin at about the level of the MTP joints.  Plantarly, the incision was marked proximal to the plantar ulcer at the level of healthy skin.  The incision was made.  Sharp dissection was carried down through the skin and subcutaneous tissue to the level of bone.  A Key elevator was then used to elevate subperiosteally creating full- thickness dorsal and plantar flaps.  An oscillating saw was then used to transect the 5 metatarsals creating an appropriate parabola of the metatarsal length bevelling the cuts plantarly.  The rasp was then used to smooth the cut surfaces of bone after the forefoot was passed off the field as a specimen to Pathology.  Tourniquet was released at 10 minutes.  Hemostasis was achieved.  The wound was irrigated copiously. The deep sutures of 3-0 Monocryl were used to approximate the subcutaneous and deep muscular layers.  Subcutaneous tissue was then approximated with inverted simple sutures of 3-0 Monocryl and simple sutures of 2-0 nylon were used to close the skin edges.  Sterile dressings were applied followed by a well-padded short-leg splint.  On the back table, a specimen deep tissue was obtained from the foot abscess area and was sent to microbiology for aerobic  and anaerobic cultures.  The patient was then awakened from anesthesia and transported to recovery room in stable condition.  FOLLOWUP PLAN:  The patient will be nonweightbearing on the left lower extremity.  She will continue with IV antibiotics for 24 hours and likely will transition to oral antibiotics for couple of weeks.  She will follow up with me in the office in 2-3 weeks for suture removal and conversion to a walking boot.     Toni Arthurs, MD     JH/MEDQ  D:  06/01/2012  T:  06/02/2012  Job:  [id number]

## 2012-06-02 NOTE — Progress Notes (Signed)
[facility] PROGRESS NOTE  [name] WNU:[id number] DOB: 1940-03-17 DOA: 05/30/2012 PCP: Default, Provider, MD  Assessment/Plan: 1-Osteomyelitis of the left second metatarsal and proximal phlanx of the second toe with possible septic arthritis of the second metatarsophalangeal joint and cellulitis of the left foot / Abscess- Continue with Vancomycin and ceftriaxone day 3. Patient S/P  transmetatarsal amputation by Dr Victorino Dike 4-4. Per Dr Victorino Dike recommendation continue with IV antibiotics for 24 hour. Will then transition to oral Levaquin for couple of weeks. Culture from prior facility grew staph and strep sensitive to levaquin.  2-Diabetes mellitus type 2 uncontrolled - patient's hemoglobin A1c done at other  hospital was 8.8. SSI. Will start lantus.  3-Chronic kidney disease - stage 3. Cr stable at 1.2.  4-Anemia probably from chronic disease - follow CBC. Mild decrease after surgery. Repeat cbc in am.  5-Hypertension - continue present medications. 6-Hyperlipidemia - continue statins. 7-History of DVT    Family Communication: care discussed with patient.  Disposition Plan: to be determine.    Consultants:  Dr Victorino Dike  Procedures:  none  Antibiotics:  Vancomycin  Ceftriaxone.   HPI/Subjective: No chest pain, no dyspnea. Feeling ok. Sitting in chair, waiting for PT evaluation.   Objective: Filed Vitals:   06/02/12 0119 06/02/12 0540 06/02/12 0957 06/02/12 1402  BP: 121/59 123/47 128/54 141/58  Pulse: 62 61 69 73  Temp: 98.1 F (36.7 C) 97.6 F (36.4 C) 98.3 F (36.8 C) 98.1 F (36.7 C)  TempSrc: Oral Oral    Resp: 18 18 20 18   Height:      Weight:      SpO2: 96% 96% 98% 99%    Intake/Output Summary (Last 24 hours) at 06/02/12 1645 Last data filed at 06/02/12 1403  Gross per 24 hour  Intake   1290 ml  Output    325 ml  Net    965 ml   Filed Weights   05/30/12 2300  Weight: 65.137 kg (143 lb 9.6 oz)    Exam:   General:  No distress.    Cardiovascular: S 1, S 2 RRR  Respiratory: CTA  Abdomen: BS present, soft, NT  Musculoskeletal: no edema. Left leg with dressing.   Data Reviewed: Basic Metabolic Panel:  Recent Labs Lab 05/31/12 0240 06/01/12 0900 06/01/12 1656 06/02/12 0553  NA 138 139  --  136  K 4.4 4.4  --  4.8  CL 103 104  --  101  CO2 26 27  --  28  GLUCOSE 119* 209*  --  193*  BUN 23 18  --  14  CREATININE 1.37* 1.19* 1.08 1.24*  CALCIUM 9.0 9.1  --  8.6   Liver Function Tests: No results found for this basename: AST, ALT, ALKPHOS, BILITOT, PROT, ALBUMIN,  in the last 168 hours No results found for this basename: LIPASE, AMYLASE,  in the last 168 hours No results found for this basename: AMMONIA,  in the last 168 hours CBC:  Recent Labs Lab 05/31/12 0240 06/01/12 0900 06/01/12 1656 06/02/12 0553  WBC 6.2 5.4 4.3 6.3  NEUTROABS 3.2  --   --   --   HGB 8.7* 8.6* 8.2* 7.7*  HCT 26.0* 25.5* 24.6* 23.4*  MCV 78.3 79.4 78.8 79.6  PLT 305 297 269 264   Cardiac Enzymes: No results found for this basename: CKTOTAL, CKMB, CKMBINDEX, TROPONINI,  in the last 168 hours BNP (last 3 results)  Recent Labs  01/15/12 1142 01/21/12 1154  PROBNP 6662.0* 2171.0*  CBG:  Recent Labs Lab 06/01/12 1457 06/01/12 1718 06/01/12 2140 06/02/12 0755 06/02/12 1223  GLUCAP 134* 139* 150* 162* 174*    Recent Results (from the past 240 hour(s))  SURGICAL PCR SCREEN     Status: None   Collection Time    06/01/12  7:50 AM      Result Value Range Status   MRSA, PCR NEGATIVE  NEGATIVE Final   Staphylococcus aureus NEGATIVE  NEGATIVE Final   Comment:            The Xpert SA Assay (FDA     approved for NASAL specimens     in patients over 73 years of age),     is one component of     a comprehensive surveillance     program.  Test performance has     been validated by [employer] for patients greater     than or equal to 27 year old.     It is not intended     to diagnose infection nor to      guide or monitor treatment.  ANAEROBIC CULTURE     Status: None   Collection Time    06/01/12  2:39 PM      Result Value Range Status   Specimen Description TISSUE FOOT LEFT   Final   Special Requests DEEP TISSUE LEFT FOREFOOT PT ON ROCEPHIN, VANCO   Final   Gram Stain     Final   Value: FEW WBC FEW SQUAMOUS EPITHELIAL CELLS PRESENT     NO ORGANISMS SEEN   Culture     Final   Value: NO ANAEROBES ISOLATED; CULTURE IN PROGRESS FOR 5 DAYS   Report Status PENDING   Incomplete  TISSUE CULTURE     Status: None   Collection Time    06/01/12  2:39 PM      Result Value Range Status   Specimen Description TISSUE FOOT LEFT   Final   Special Requests DEEP TISSUE LEFT FOREFOOT PT ON ROCEPHIN,VANCO   Final   Gram Stain     Final   Value: FEW WBC FEW SQUAMOUS EPITHELIAL CELLS PRESENT     NO ORGANISMS SEEN   Culture NO GROWTH 1 DAY   Final   Report Status PENDING   Incomplete     Studies: No results found.  Scheduled Meds: . atorvastatin  40 mg Oral Daily  . cefTRIAXone (ROCEPHIN)  IV  1 g Intravenous QHS  . enoxaparin (LOVENOX) injection  40 mg Subcutaneous Q24H  . insulin aspart  0-9 Units Subcutaneous TID WC  . metoprolol succinate  50 mg Oral Q breakfast  . tamsulosin  0.4 mg Oral Daily  . vancomycin  1,000 mg Intravenous Q24H  . verapamil  180 mg Oral Daily   Continuous Infusions: . sodium chloride 20 mL (05/31/12 0615)    Principal Problem:   Osteomyelitis of foot Active Problems:   DM2 (diabetes mellitus, type 2)   Right foot ulcer   Cellulitis of foot   CKD (chronic kidney disease)   Hyperlipidemia    Time spent: 25 minutes.     Ruth Douglas  [facility] Pager [phone]. If 7PM-7AM, please contact night-coverage at [email or web address], password [facility] 06/02/2012, 4:45 PM  LOS: 3 days

## 2012-06-02 NOTE — Evaluation (Signed)
Physical Therapy Evaluation Patient Details Name: Ruth Douglas MRN: [id number] DOB: 1940/05/08 Today's Date: 06/02/2012 Time: 8413-2440 PT Time Calculation (min): 11 min  PT Assessment / Plan / Recommendation Clinical Impression  Pt is a 72 y/o female s/P L midfoot amupation with history of R midfoot amputation  Acute PT to follow pt to progress mobility for safe d/c to home  .      PT Assessment  Patient needs continued PT services    Follow Up Recommendations  No PT follow up    Does the patient have the potential to tolerate intense rehabilitation      Barriers to Discharge        Equipment Recommendations  None recommended by PT    Recommendations for Other Services     Frequency Min 3X/week    Precautions / Restrictions Precautions Precautions: Fall Restrictions Weight Bearing Restrictions: Yes LLE Weight Bearing: Non weight bearing   Pertinent Vitals/Pain 4/10 pain in foot.       Mobility  Bed Mobility Bed Mobility: Supine to Sit;Sit to Supine Supine to Sit: 6: Modified independent (Device/Increase time) Sit to Supine: 7: Independent Transfers Transfers: Sit to Stand;Stand to [employer] Transfers Sit to Stand: 6: Modified independent (Device/Increase time);From bed;From chair/3-in-1;With upper extremity assist Stand to Sit: 6: Modified independent (Device/Increase time);To chair/3-in-1;To bed Stand Pivot Transfers: 5: Supervision Ambulation/Gait Ambulation/Gait Assistance: Not tested (comment)    Exercises     PT Diagnosis: Difficulty walking;Abnormality of gait;Acute pain  PT Problem List: Decreased mobility PT Treatment Interventions: Gait training;Stair training;DME instruction;Patient/family education   PT Goals Acute Rehab PT Goals PT Goal Formulation: With patient Time For Goal Achievement: 06/09/12 Potential to Achieve Goals: Good Pt will Transfer Bed to Chair/Chair to Bed: with modified independence PT Transfer Goal: Bed to  Chair/Chair to Bed - Progress: Goal set today Pt will Ambulate: 1 - 15 feet;with modified independence;with least restrictive assistive device;Other (comment) (with appropriate WB status in L foot. ) PT Goal: Ambulate - Progress: Goal set today  Visit Information  Last PT Received On: 06/02/12 Assistance Needed: +1    Subjective Data  Subjective: Agree to PT eval.     Prior Functioning  Home Living Lives With: Spouse Available Help at Discharge: Family;Available 24 hours/day Type of Home: House Home Access: Ramped entrance Home Layout: One level Bathroom Shower/Tub: Walk-in shower (sponge baths for now) Firefighter: Standard Bathroom Accessibility: Yes How Accessible: Accessible via wheelchair;Accessible via walker Home Adaptive Equipment: Walker - standard;Straight cane;Wheelchair - manual Additional Comments: May have a RW and 3n1 at sisters house, husband will check today Prior Function Level of Independence: Independent Able to Take Stairs?: Yes Vocation: Retired Musician: No difficulties Dominant Hand: Right    Cognition  Cognition Overall Cognitive Status: Appears within functional limits for tasks assessed/performed Arousal/Alertness: Awake/alert Orientation Level: Appears intact for tasks assessed Behavior During Session: [facility] for tasks performed    Extremity/Trunk Assessment Right Lower Extremity Assessment RLE ROM/Strength/Tone: [facility] for tasks assessed Left Lower Extremity Assessment LLE ROM/Strength/Tone: Unable to fully assess   Balance    End of Session PT - End of Session Equipment Utilized During Treatment: Gait belt Activity Tolerance: Patient tolerated treatment well Patient left: in bed Nurse Communication: Mobility status  GP     Ruth Douglas 06/02/2012, 5:17 PM  Ruth Douglas L. Ruth Douglas DPT [phone]

## 2012-06-02 NOTE — Progress Notes (Signed)
Subjective: 1 Day Post-Op Procedure(s) (LRB): LEFT FOOT TRANSMETATARSEL AMPUTATION (Left) PERCUTANEOUS TENDON ACHILLES LENGTHING (Left) Patient reports pain as mild.  Denies f/c/n/v.  Objective: Vital signs in last 24 hours: Temp:  [97.5 F (36.4 C)-98.1 F (36.7 C)] 97.6 F (36.4 C) (04/05 0540) Pulse Rate:  [60-80] 61 (04/05 0540) Resp:  [16-21] 18 (04/05 0540) BP: (121-149)/(46-59) 123/47 mmHg (04/05 0540) SpO2:  [91 %-100 %] 96 % (04/05 0540)  Intake/Output from previous day: 04/04 0701 - 04/05 0700 In: 2125.3 [P.O.:240; I.V.:1635.3; IV Piggyback:250] Out: 425 [Urine:325; Blood:100] Intake/Output this shift:     Recent Labs  05/31/12 0240 06/01/12 0900 06/01/12 1656 06/02/12 0553  HGB 8.7* 8.6* 8.2* 7.7*    Recent Labs  06/01/12 1656 06/02/12 0553  WBC 4.3 6.3  RBC 3.12* 2.94*  HCT 24.6* 23.4*  PLT 269 264    Recent Labs  06/01/12 0900 06/01/12 1656 06/02/12 0553  NA 139  --  136  K 4.4  --  4.8  CL 104  --  101  CO2 27  --  28  BUN 18  --  14  CREATININE 1.19* 1.08 1.24*  GLUCOSE 209*  --  193*  CALCIUM 9.1  --  8.6   No results found for this basename: LABPT, INR,  in the last 72 hours  L LE splinted.  Cultures show no growth to date.  Assessment/Plan: 1 Day Post-Op Procedure(s) (LRB): LEFT FOOT TRANSMETATARSEL AMPUTATION (Left) PERCUTANEOUS TENDON ACHILLES LENGTHING (Left) Up with therapy  NWB on LLE.  Ruth Douglas 06/02/2012, 9:31 AM

## 2012-06-03 LAB — GLUCOSE, CAPILLARY
Glucose-Capillary: 167 mg/dL — ABNORMAL HIGH (ref 70–99)
Glucose-Capillary: 153 mg/dL — ABNORMAL HIGH (ref 70–99)
Glucose-Capillary: 143 mg/dL — ABNORMAL HIGH (ref 70–99)

## 2012-06-03 LAB — CBC
MCHC: 32.7 g/dL (ref 30.0–36.0)
Platelets: 293 10*3/uL (ref 150–400)
RDW: 13.6 % (ref 11.5–15.5)

## 2012-06-03 MED ORDER — LEVOFLOXACIN 500 MG PO TABS
500.0000 mg | ORAL_TABLET | Freq: Every day | ORAL | Status: DC
  Administered 2012-06-03 – 2012-06-04 (×2): 500 mg via ORAL
  Filled 2012-06-03 (×2): qty 1

## 2012-06-03 MED ORDER — FERROUS SULFATE 325 (65 FE) MG PO TABS
325.0000 mg | ORAL_TABLET | Freq: Two times a day (BID) | ORAL | Status: DC
  Administered 2012-06-03 – 2012-06-04 (×2): 325 mg via ORAL
  Filled 2012-06-03 (×3): qty 1

## 2012-06-03 MED ORDER — LEVOFLOXACIN 500 MG PO TABS: 500.0000 mg | ORAL_TABLET | Freq: Every day | ORAL | Status: DC

## 2012-06-03 MED ORDER — DOCUSATE SODIUM 100 MG PO CAPS
100.0000 mg | ORAL_CAPSULE | Freq: Two times a day (BID) | ORAL | Status: DC
  Administered 2012-06-03 – 2012-06-04 (×3): 100 mg via ORAL
  Filled 2012-06-03 (×3): qty 1

## 2012-06-03 MED ORDER — LEVOFLOXACIN 750 MG PO TABS: 750.0000 mg | ORAL_TABLET | ORAL | Status: DC

## 2012-06-03 MED ORDER — POLYETHYLENE GLYCOL 3350 17 G PO PACK
17.0000 g | PACK | Freq: Two times a day (BID) | ORAL | Status: DC
  Administered 2012-06-03 – 2012-06-04 (×3): 17 g via ORAL
  Filled 2012-06-03 (×5): qty 1

## 2012-06-03 NOTE — Progress Notes (Addendum)
Subjective: 2 Days Post-Op Procedure(s) (LRB): LEFT FOOT TRANSMETATARSEL AMPUTATION (Left) PERCUTANEOUS TENDON ACHILLES LENGTHING (Left) Patient reports pain as mild.    Objective: Vital signs in last 24 hours: Temp:  [98.1 F (36.7 C)-99.7 F (37.6 C)] 99.6 F (37.6 C) (04/06 0544) Pulse Rate:  [61-77] 61 (04/06 0544) Resp:  [18-20] 18 (04/06 0544) BP: (120-145)/(49-58) 120/55 mmHg (04/06 0544) SpO2:  [92 %-99 %] 92 % (04/06 0544)  Intake/Output from previous day: 04/05 0701 - 04/06 0700 In: 840 [P.O.:840] Out: 450 [Urine:450] Intake/Output this shift:     Recent Labs  06/01/12 0900 06/01/12 1656 06/02/12 0553  HGB 8.6* 8.2* 7.7*    Recent Labs  06/01/12 1656 06/02/12 0553  WBC 4.3 6.3  RBC 3.12* 2.94*  HCT 24.6* 23.4*  PLT 269 264    Recent Labs  06/01/12 0900 06/01/12 1656 06/02/12 0553  NA 139  --  136  K 4.4  --  4.8  CL 104  --  101  CO2 27  --  28  BUN 18  --  14  CREATININE 1.19* 1.08 1.24*  GLUCOSE 209*  --  193*  CALCIUM 9.1  --  8.6   No results found for this basename: LABPT, INR,  in the last 72 hours  Neurologically intact Neurovascular intact Sensation intact distally Intact pulses distally Dorsiflexion/Plantar flexion intact Incision: dressing C/D/I and no drainage No cellulitis present Compartment soft No calf pain or sign of DVT  Assessment/Plan: 2 Days Post-Op Procedure(s) (LRB): LEFT FOOT TRANSMETATARSEL AMPUTATION (Left) PERCUTANEOUS TENDON ACHILLES LENGTHING (Left) Advance diet Continue ABX therapy due to Post-op infection Will recheck CBC today- Hgb 7.7 yesterday Discussed with Dr. Victorino Dike. Initial recommendation of IV abx for 24 hr then switch to PO, abx per admitting service. If need for continued IV abx, order PICC line.  Discussed with Dr. Shelle Iron, who talked to hospitalist this AM. Will D/C IV abx (vanco) and order PO levaquin per plan in progress note yesterday.  BISSELL, JACLYN M. 06/03/2012, 7:45 AM

## 2012-06-03 NOTE — Progress Notes (Signed)
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 Patient S/P  transmetatarsal amputation by Dr Victorino Dike 4-4. Per Dr Victorino Dike recommendation patient was continue with IV antibiotics for 24 hour post surgery.  Patient was started on  Levaquin 4-6 for couple of weeks. Culture from prior facility grew staph and strep sensitive to levaquin. Wound Culture from here : no growth. Will defer DVT prophylaxis to ortho.  2-Diabetes mellitus type 2 uncontrolled - patient's hemoglobin A1c done at other  hospital was 8.8. SSI. Continue with  lantus.  3-Chronic kidney disease - stage 3. Cr stable at 1.2.  4-Anemia probably from chronic disease - follow CBC. Mild decrease after surgery. HB stable at 8.  5-Hypertension - continue present medications. 6-Hyperlipidemia - continue statins. 7-History of DVT  8-Constipation; add miralx, and docusate.    Family Communication: care discussed with patient.  Disposition Plan: Home 4-7 if ok with ortho.    Consultants:  Dr Victorino Dike  Procedures:  none  Antibiotics:  Vancomycin  Ceftriaxone.   HPI/Subjective: Feeling well. No BM.   Objective: Filed Vitals:   06/02/12 0957 06/02/12 1402 06/02/12 2115 06/03/12 0544  BP: 128/54 141/58 145/49 120/55  Pulse: 69 73 77 61  Temp: 98.3 F (36.8 C) 98.1 F (36.7 C) 99.7 F (37.6 C) 99.6 F (37.6 C)  TempSrc:   Oral Oral  Resp: 20 18 18 18   Height:      Weight:      SpO2: 98% 99% 96% 92%    Intake/Output Summary (Last 24 hours) at 06/03/12 1403 Last data filed at 06/03/12 1100  Gross per 24 hour  Intake    240 ml  Output    950 ml  Net   -710 ml   Filed Weights   05/30/12 2300  Weight: 65.137 kg (143 lb 9.6 oz)    Exam:   General:  No distress.   Cardiovascular: S 1, S 2 RRR  Respiratory: CTA  Abdomen: BS present, soft, NT  Musculoskeletal: no edema. Left leg with dressing.   Data Reviewed: Basic Metabolic Panel:  Recent Labs Lab 05/31/12 0240 06/01/12 0900 06/01/12 1656 06/02/12 0553  NA 138 139  --  136  K 4.4 4.4  --  4.8  CL 103 104  --  101  CO2 26 27  --  28  GLUCOSE 119* 209*  --  193*  BUN 23 18  --  14  CREATININE 1.37* 1.19* 1.08 1.24*  CALCIUM 9.0 9.1  --  8.6   Liver Function Tests: No results found for this basename: AST, ALT, ALKPHOS, BILITOT, PROT, ALBUMIN,  in the last 168 hours No results found for this basename: LIPASE, AMYLASE,  in the last 168 hours No results found for this basename: AMMONIA,  in the last 168 hours CBC:  Recent Labs Lab 05/31/12 0240 06/01/12 0900 06/01/12 1656 06/02/12 0553 06/03/12 0824  WBC 6.2 5.4 4.3 6.3 6.5  NEUTROABS 3.2  --   --   --   --   HGB 8.7* 8.6* 8.2* 7.7* 8.3*  HCT 26.0* 25.5* 24.6* 23.4* 25.4*  MCV 78.3 79.4 78.8 79.6 79.6  PLT 305 297 269 264 293   Cardiac Enzymes: No results found for this basename:  CKTOTAL, CKMB, CKMBINDEX, TROPONINI,  in the last 168 hours BNP (last 3 results)  Recent Labs  01/15/12 1142 01/21/12 1154  PROBNP 6662.0* 2171.0*   CBG:  Recent Labs Lab 06/02/12 1223 06/02/12 1726 06/02/12 2136 06/03/12 0759 06/03/12 1210  GLUCAP 174* 167* 153* 143* 192*    Recent Results (from the past 240 hour(s))  SURGICAL PCR SCREEN     Status: None   Collection Time    06/01/12  7:50 AM      Result Value Range Status   MRSA, PCR NEGATIVE  NEGATIVE Final   Staphylococcus aureus NEGATIVE  NEGATIVE Final   Comment:            The Xpert SA Assay (FDA     approved for NASAL specimens     in patients over 66 years of age),     is one component of     a comprehensive surveillance     program.  Test performance has     been validated  by [employer] for patients greater     than or equal to 34 year old.     It is not intended     to diagnose infection nor to     guide or monitor treatment.  ANAEROBIC CULTURE     Status: None   Collection Time    06/01/12  2:39 PM      Result Value Range Status   Specimen Description TISSUE FOOT LEFT   Final   Special Requests DEEP TISSUE LEFT FOREFOOT PT ON ROCEPHIN, VANCO   Final   Gram Stain     Final   Value: FEW WBC FEW SQUAMOUS EPITHELIAL CELLS PRESENT     NO ORGANISMS SEEN   Culture     Final   Value: NO ANAEROBES ISOLATED; CULTURE IN PROGRESS FOR 5 DAYS   Report Status PENDING   Incomplete  TISSUE CULTURE     Status: None   Collection Time    06/01/12  2:39 PM      Result Value Range Status   Specimen Description TISSUE FOOT LEFT   Final   Special Requests DEEP TISSUE LEFT FOREFOOT PT ON ROCEPHIN,VANCO   Final   Gram Stain     Final   Value: FEW WBC FEW SQUAMOUS EPITHELIAL CELLS PRESENT     NO ORGANISMS SEEN   Culture Culture reincubated for better growth   Final   Report Status PENDING   Incomplete     Studies: No results found.  Scheduled Meds: . atorvastatin  40 mg Oral Daily  . docusate sodium  100 mg Oral BID  . enoxaparin (LOVENOX) injection  40 mg Subcutaneous Q24H  . insulin aspart  0-9 Units Subcutaneous TID WC  . insulin glargine  10 Units Subcutaneous QHS  . levofloxacin  500 mg Oral Daily  . metoprolol succinate  50 mg Oral Q breakfast  . polyethylene glycol  17 g Oral BID  . tamsulosin  0.4 mg Oral Daily  . verapamil  180 mg Oral Daily   Continuous Infusions: . sodium chloride 20 mL (05/31/12 0615)    Principal Problem:   Osteomyelitis of foot Active Problems:   DM2 (diabetes mellitus, type 2)   Right foot ulcer   Cellulitis of foot   CKD (chronic kidney disease)   Hyperlipidemia    Time spent: 25 minutes.     Raniah Karan  [facility] Pager [phone]. If 7PM-7AM, please contact night-coverage at [email or web address],  password [email or web address] 06/03/2012, 2:03 PM  LOS: 4 days

## 2012-06-04 ENCOUNTER — Encounter (HOSPITAL_COMMUNITY): Payer: Self-pay | Admitting: Orthopedic Surgery

## 2012-06-04 LAB — GLUCOSE, CAPILLARY: Glucose-Capillary: 207 mg/dL — ABNORMAL HIGH (ref 70–99)

## 2012-06-04 MED ORDER — ENOXAPARIN SODIUM 30 MG/0.3ML ~~LOC~~ SOLN: 30.0000 mg | Freq: Every day | SUBCUTANEOUS | Status: DC

## 2012-06-04 MED ORDER — INSULIN GLARGINE 100 UNIT/ML ~~LOC~~ SOLN: 10.0000 [IU] | Freq: Every day | SUBCUTANEOUS | Status: DC

## 2012-06-04 MED ORDER — FERROUS SULFATE 325 (65 FE) MG PO TABS: 325.0000 mg | ORAL_TABLET | Freq: Two times a day (BID) | ORAL | Status: DC

## 2012-06-04 MED ORDER — BD GETTING STARTED TAKE HOME KIT: 3/10ML X 30G SYRINGES
1.0000 | Freq: Once | Status: AC
  Administered 2012-06-04: 1
  Filled 2012-06-04: qty 1

## 2012-06-04 MED ORDER — DOXYCYCLINE HYCLATE 50 MG PO CAPS: 100.0000 mg | ORAL_CAPSULE | Freq: Two times a day (BID) | ORAL | Status: DC

## 2012-06-04 MED ORDER — ENOXAPARIN SODIUM 30 MG/0.3ML ~~LOC~~ SOLN: 30.0000 mg | Freq: Two times a day (BID) | SUBCUTANEOUS | Status: DC

## 2012-06-04 MED ORDER — LEVOFLOXACIN 500 MG PO TABS: 500.0000 mg | ORAL_TABLET | Freq: Every day | ORAL | Status: DC

## 2012-06-04 MED ORDER — LIVING WELL WITH DIABETES BOOK
Freq: Once | Status: AC
  Administered 2012-06-04: 13:00:00
  Filled 2012-06-04: qty 1

## 2012-06-04 NOTE — Care Management Note (Signed)
  Page 2 of 2   06/04/2012     12:28:33 PM   CARE MANAGEMENT NOTE 06/04/2012  Patient:  Ruth Douglas, Ruth Douglas   Account Number:  [id number]  Date Initiated:  06/04/2012  Documentation initiated by:  Ronny Flurry  Subjective/Objective Assessment:     Action/Plan:   Anticipated DC Date:  06/04/2012   Anticipated DC Plan:  HOME W HOME HEALTH SERVICES         Choice offered to / List presented to:          [facility] arranged  HH-1 RN  HH-2 PT      [facility] agency  [facility]   Status of service:  Completed, signed off Medicare Important Message given?   (If response is "NO", the following Medicare IM given date fields will be blank) Date Medicare IM given:   Date Additional Medicare IM given:    Discharge Disposition:    Per UR Regulation:    If discussed at Long Length of Stay Meetings, dates discussed:    Comments:  06-04-12 Spoke with patient at bedside , provided list of Home Health Agencies , patient decided on [facility] [phone] , fax [phone] .  Tammy at [facility] [phone] , fax [phone] , accepted referral . HHRN will see patient tomorrow 06-05-12 , PT will start in 2 days or possiblitiy not until end of the week . DR Sunnie Nielsen and patient aware of start dates .  Dr Sunnie Nielsen ordered RN for Lovenox teaching , no would care. Dr Sunnie Nielsen stated orthro will change dressing in office.  Patient states she has a rolling walker at home , she does not want another .   Called Lovenox prescription to patient's pharmacy [facility] , [address] , [place] [phone]  ( spoke with [place] ) , Lovenox is in stock , co - pay will be $ 95.00 . Patient aware and states she can afford.   Ronny Flurry RN BSN [phone]

## 2012-06-04 NOTE — Discharge Summary (Signed)
Physician Discharge Summary  [name] ZOX:[id number] DOB: [birth year] DOA: 05/30/2012  PCP: Default, Provider, MD  Admit date: 05/30/2012 Discharge date: 06/04/2012  Time spent: 35 minutes  Recommendations for Outpatient Follow-up:  1. Needs to Follow up With PCP, need CBC , B-met to follow Hb and renal function,. Needs adjustment of diabetes medications.   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  Diet recommendation: Heart Healthy.   Filed Weights   05/30/12 2300  Weight: 65.137 kg (143 lb 9.6 oz)    History of present illness:  Ruth Douglas is a 72 y.o. female history of diabetes mellitus type 2, chronic kidney disease, chronic anemia, hypertension and hyperlipidemia has been having bilateral foot ulcers. Patient has chronic right foot ulcer but last few weeks patient had developed left foot ulcer and has been following with wound team at [facility]. Patient's left foot wound became acutely worse last Friday 5 days ago and was prescribed oral antibiotics by the wound team physician despite which patient was leg became more swollen and painful and had gone to the hospital and was admitted. Wound cultures done at wound center grew staph aureus and group B strep which was sensitive to ceftriaxone. Patient was started on ceftriaxone. MRI showed features consistent with osteomyelitis of the head of the second metatarsal and proximal flanks of the second toe the left foot with possible septic arthritis of the second metatarsal phalangeal joint. There was a possibility of third toe osteomyelitis. MRI also showed features consistent with cellulitis of the left foot. Patient denies any fever chills. Patient denies any shortness of breath nausea vomiting chest pain.   Hospital Course:   1-Osteomyelitis of the left second metatarsal and proximal phlanx of the second toe with possible septic arthritis of the second metatarsophalangeal joint and cellulitis of the left foot / Abscess- Patient received IV  Vancomycin and ceftriaxone for 3 days. Patient S/P transmetatarsal amputation by Dr Victorino Dike 4-4. Per Dr Victorino Dike recommendation continue with IV antibiotics for 24 hour after surgery. Will then transition to oral Levaquin for couple of weeks. Culture from prior facility grew  strep sensitive to levaquin. Will add doxy for MRSA coverage. Patient will be discharge on Lovenox for 2 weeks for DVT prophylaxis.  2-Diabetes mellitus type 2 uncontrolled - patient's hemoglobin A1c done at other hospital was 8.8. SSI. Discharge on  lantus. Will hold metformin at discharge due to mild increase Cr. Needs to follow up with PCP for further adjustment of medications.  3-Chronic kidney disease - stage 3. Cr stable at 1.2.  4-Anemia probably from chronic disease - follow CBC. Mild decrease after surgery. Star ferrous sulfate. Hb stable yesterday at 8.  5-Hypertension - continue present medications.  6-Hyperlipidemia - continue statins.  7-History of DVT    Procedures: Patient S/P transmetatarsal amputation by Dr Victorino Dike 4-4.  Consultations:  Dr Victorino Dike.  Discharge Exam: Filed Vitals:   06/02/12 2115 06/03/12 0544 06/03/12 2224 06/04/12 0526  BP: 145/49 120/55 127/49 138/54  Pulse: 77 61 69 74  Temp: 99.7 F (37.6 C) 99.6 F (37.6 C) 98.4 F (36.9 C) 97.7 F (36.5 C)  TempSrc: Oral Oral Oral Oral  Resp: 18 18 18 18   Height:      Weight:      SpO2: 96% 92% 98% 96%  General: no distress.  Cardiovascular: S 1, S 2 RRR Respiratory: CTA MSK: Left LE with dressing.   Discharge Instructions  Discharge Orders   Future Orders Complete By Expires     Diet - low sodium heart healthy  As directed     Increase activity slowly  As directed     Scheduling Instructions:      NWB to left LE         Medication List    STOP taking these medications       metFORMIN 1000 MG tablet  Commonly known as:  GLUCOPHAGE      TAKE these medications       atorvastatin 40 MG tablet  Commonly known as:  LIPITOR  Take 40 mg by mouth daily.     doxycycline 50 MG capsule  Commonly known as:  VIBRAMYCIN  Take 2 capsules (100 mg total) by mouth 2 (two) times daily.     enoxaparin 30 MG/0.3ML injection  Commonly known as:  LOVENOX  Inject 0.3 mLs (30 mg total) into the skin every 12 (twelve) hours.     ferrous sulfate 325 (65 FE) MG tablet  Take 1 tablet (325 mg total) by mouth 2 (two) times daily with a meal.     insulin glargine 100 UNIT/ML injection  Commonly known as:  LANTUS  Inject 0.1 mLs (10 Units total) into the skin at bedtime.     levofloxacin 500 MG tablet  Commonly known as:  LEVAQUIN  Take 1 tablet (500 mg total) by mouth daily.     metoprolol succinate 50 MG 24 hr tablet  Commonly known as:  TOPROL-XL  Take 50 mg by mouth daily. Take with or immediately following a meal.     tamsulosin 0.4 MG Caps  Commonly known as:  FLOMAX  Take 1 capsule (0.4 mg total) by mouth daily.     Verapamil HCl CR 200 MG Cp24  Take 1 capsule by mouth every morning.           Follow-up Information   Follow up with HEWITT, Jonny Ruiz, MD. Schedule an appointment as soon as possible for a visit in 2 weeks.   Contact information:   [address] [place] [address] [phone]        The results of significant diagnostics from this hospitalization (including imaging, microbiology, ancillary and laboratory) are listed below for reference.    Significant Diagnostic Studies: No results found.  Microbiology: Recent Results (from the past 240 hour(s))  SURGICAL PCR SCREEN     Status: None   Collection Time    06/01/12  7:50 AM      Result Value Range Status   MRSA, PCR NEGATIVE  NEGATIVE Final   Staphylococcus aureus NEGATIVE  NEGATIVE Final   Comment:             The Xpert SA Assay (FDA     approved for NASAL specimens     in patients over 63 years of age),     is one component of     a comprehensive surveillance     program.  Test performance has     been validated by [employer] for patients greater     than or equal to 52 year old.     It is not intended     to diagnose infection nor to     guide or monitor treatment.  ANAEROBIC CULTURE     Status:  None   Collection Time    06/01/12  2:39 PM      Result Value Range Status   Specimen Description TISSUE FOOT LEFT   Final   Special Requests DEEP TISSUE LEFT FOREFOOT PT ON ROCEPHIN, VANCO   Final   Gram Stain     Final   Value: FEW WBC FEW SQUAMOUS EPITHELIAL CELLS PRESENT     NO ORGANISMS SEEN   Culture     Final   Value: NO ANAEROBES ISOLATED; CULTURE IN PROGRESS FOR 5 DAYS   Report Status PENDING   Incomplete  TISSUE CULTURE     Status: None   Collection Time    06/01/12  2:39 PM      Result Value Range Status   Specimen Description TISSUE FOOT LEFT   Final   Special Requests DEEP TISSUE LEFT FOREFOOT PT ON ROCEPHIN,VANCO   Final   Gram Stain     Final   Value: FEW WBC FEW SQUAMOUS EPITHELIAL CELLS PRESENT     NO ORGANISMS SEEN   Culture Culture reincubated for better growth   Final   Report Status PENDING   Incomplete     Labs: Basic Metabolic Panel:  Recent Labs Lab 05/31/12 0240 06/01/12 0900 06/01/12 1656 06/02/12 0553  NA 138 139  --  136  K 4.4 4.4  --  4.8  CL 103 104  --  101  CO2 26 27  --  28  GLUCOSE 119* 209*  --  193*  BUN 23 18  --  14  CREATININE 1.37* 1.19* 1.08 1.24*  CALCIUM 9.0 9.1  --  8.6   Liver Function Tests: No results found for this basename: AST, ALT, ALKPHOS, BILITOT, PROT, ALBUMIN,  in the last 168 hours No results found for this basename: LIPASE, AMYLASE,  in the last 168 hours No results found for this basename: AMMONIA,  in the last 168 hours CBC:  Recent Labs Lab 05/31/12 0240 06/01/12 0900 06/01/12 1656  06/02/12 0553 06/03/12 0824  WBC 6.2 5.4 4.3 6.3 6.5  NEUTROABS 3.2  --   --   --   --   HGB 8.7* 8.6* 8.2* 7.7* 8.3*  HCT 26.0* 25.5* 24.6* 23.4* 25.4*  MCV 78.3 79.4 78.8 79.6 79.6  PLT 305 297 269 264 293   Cardiac Enzymes: No results found for this basename: CKTOTAL, CKMB, CKMBINDEX, TROPONINI,  in the last 168 hours BNP: BNP (last 3 results)  Recent Labs  01/15/12 1142 01/21/12 1154  PROBNP 6662.0* 2171.0*   CBG:  Recent Labs Lab 06/03/12 0759 06/03/12 1210 06/03/12 1715 06/03/12 2221 06/04/12 0813  GLUCAP 143* 192* 164* 150* 175*       Signed:  Jaclin Finks  [facility] 06/04/2012, 10:31 AM

## 2012-06-04 NOTE — Progress Notes (Signed)
Physical Therapy Treatment Patient Details Name: Ruth Douglas MRN: [id number] DOB: 12-10-1940 Today's Date: 06/04/2012 Time: 7829-5621 PT Time Calculation (min): 14 min  PT Assessment / Plan / Recommendation Comments on Treatment Session  Pt able to initiate short gait episodes with NWB today.  MD in during rx and stated pt would be going home if OK with 1 other MD.  Discussed HHPT and PT recommended    Follow Up Recommendations  Home health PT     Does the patient have the potential to tolerate intense rehabilitation     Barriers to Discharge        Equipment Recommendations  None recommended by PT    Recommendations for Other Services    Frequency Min 3X/week   Plan Discharge plan needs to be updated- now recommend HHPT    Precautions / Restrictions Precautions Precautions: Fall Restrictions LLE Weight Bearing: Non weight bearing   Pertinent Vitals/Pain No c/o pain    Mobility  Bed Mobility Supine to Sit: 6: Modified independent (Device/Increase time);HOB elevated Transfers Transfers: Sit to Stand Sit to Stand: 6: Modified independent (Device/Increase time) Stand to Sit: 6: Modified independent (Device/Increase time) Ambulation/Gait Ambulation/Gait Assistance: 4: Min assist Ambulation Distance (Feet): 6 Feet (x 2 reps) Assistive device: Rolling walker Ambulation/Gait Assistance Details: Pt able to maintain NWB status    Exercises     PT Diagnosis:    PT Problem List:   PT Treatment Interventions:     PT Goals Acute Rehab PT Goals PT Goal Formulation: With patient PT Transfer Goal: Bed to Chair/Chair to Bed - Progress: Progressing toward goal PT Goal: Ambulate - Progress: Progressing toward goal  Visit Information  Last PT Received On: 06/04/12    Subjective Data  Subjective: Am I able to put weight on it?   Cognition  Cognition Overall Cognitive Status: Appears within functional limits for tasks assessed/performed Arousal/Alertness:  Awake/alert Orientation Level: Appears intact for tasks assessed Behavior During Session: [facility] for tasks performed    Balance     End of Session PT - End of Session Equipment Utilized During Treatment: Gait belt Activity Tolerance: Patient tolerated treatment well Patient left: in chair;with call bell/phone within reach;with family/visitor present Nurse Communication: Other (comment) (MD in room and discussed HHPT and mobililty)   GP     Ruth Douglas 06/04/2012, 10:32 AM

## 2012-06-06 LAB — ANAEROBIC CULTURE

## 2012-06-07 LAB — TISSUE CULTURE

## 2012-07-08 DIAGNOSIS — I498 Other specified cardiac arrhythmias: Secondary | ICD-10-CM

## 2012-07-09 DIAGNOSIS — I509 Heart failure, unspecified: Secondary | ICD-10-CM

## 2012-07-09 DIAGNOSIS — I498 Other specified cardiac arrhythmias: Secondary | ICD-10-CM

## 2012-08-02 ENCOUNTER — Encounter: Payer: Self-pay | Admitting: General Practice

## 2012-08-02 ENCOUNTER — Ambulatory Visit (INDEPENDENT_AMBULATORY_CARE_PROVIDER_SITE_OTHER): Payer: Medicare HMO | Admitting: General Practice

## 2012-08-02 VITALS — BP 162/74 | HR 97 | Temp 97.6°F | Ht 62.0 in | Wt 152.0 lb

## 2012-08-02 DIAGNOSIS — E109 Type 1 diabetes mellitus without complications: Secondary | ICD-10-CM

## 2012-08-02 DIAGNOSIS — IMO0001 Reserved for inherently not codable concepts without codable children: Secondary | ICD-10-CM

## 2012-08-02 NOTE — Progress Notes (Signed)
Patient ID: Ruth Douglas, female   DOB: [birth year], 72 y.o.   MRN: [id number]  S: Patient presents today for diabetic foot exam and to have form completed to obtain diabetic foot wear (Therapeutic shoes).  O: Denies headache, malaise, shortness of breath, chest pain or tightness, heart palpitations, skin rashes or eruptions, peripheral edema  A: Normocephalic, atraumatic, clear/equal bilateral breath sounds, heart rate and rhythm regular, negative skin rashes or eruptions. Left foot transmetatarsal amputation noted. Negative ulcers noted.  P: Diabetic foot exam -Continue proper feet hygiene and care as discussed -will complete form for diabetic footwear -RTO if symptoms develop Patient verbalized understanding -Coralie Keens, FNP-C

## 2012-09-04 ENCOUNTER — Encounter: Payer: Self-pay | Admitting: *Deleted

## 2012-09-04 NOTE — Progress Notes (Signed)
Patient ID: [name], female   DOB: 1940-08-23, 72 y.o.   MRN: [id number]  07/12/2012 - 11:45 - LM  07/17/2012 - 10:52 - LM  07/25/2012 - 9:57 - LM   07/31/2012 - Spoke with son Vonna Kotyk), stated patient has OV with PMD this week - [place].  Asked son to have her return call to let us know if she plans to do monitor and follow up here.                                                     09/04/2012 - 10:29 - No contact from patient to present.     ----agh

## 2014-06-18 ENCOUNTER — Emergency Department (HOSPITAL_COMMUNITY): Payer: Medicare FFS

## 2014-06-18 ENCOUNTER — Encounter (HOSPITAL_COMMUNITY): Payer: Self-pay

## 2014-06-18 ENCOUNTER — Inpatient Hospital Stay (HOSPITAL_COMMUNITY)
Admission: EM | Admit: 2014-06-18 | Discharge: 2014-06-23 | DRG: 617 | Disposition: A | Discharge status: Home Health | Payer: Medicare FFS | Attending: Internal Medicine | Admitting: Internal Medicine

## 2014-06-18 DIAGNOSIS — L089 Local infection of the skin and subcutaneous tissue, unspecified: Secondary | ICD-10-CM | POA: Diagnosis present

## 2014-06-18 DIAGNOSIS — I509 Heart failure, unspecified: Secondary | ICD-10-CM | POA: Diagnosis present

## 2014-06-18 DIAGNOSIS — I129 Hypertensive chronic kidney disease with stage 1 through stage 4 chronic kidney disease, or unspecified chronic kidney disease: Secondary | ICD-10-CM | POA: Diagnosis present

## 2014-06-18 DIAGNOSIS — M869 Osteomyelitis, unspecified: Secondary | ICD-10-CM | POA: Diagnosis present

## 2014-06-18 DIAGNOSIS — Z89422 Acquired absence of other left toe(s): Secondary | ICD-10-CM

## 2014-06-18 DIAGNOSIS — E11628 Type 2 diabetes mellitus with other skin complications: Secondary | ICD-10-CM | POA: Diagnosis present

## 2014-06-18 DIAGNOSIS — Z794 Long term (current) use of insulin: Secondary | ICD-10-CM | POA: Diagnosis not present

## 2014-06-18 DIAGNOSIS — E1140 Type 2 diabetes mellitus with diabetic neuropathy, unspecified: Secondary | ICD-10-CM | POA: Diagnosis present

## 2014-06-18 DIAGNOSIS — N179 Acute kidney failure, unspecified: Secondary | ICD-10-CM | POA: Diagnosis present

## 2014-06-18 DIAGNOSIS — D649 Anemia, unspecified: Secondary | ICD-10-CM

## 2014-06-18 DIAGNOSIS — Z89421 Acquired absence of other right toe(s): Secondary | ICD-10-CM | POA: Diagnosis not present

## 2014-06-18 DIAGNOSIS — Z801 Family history of malignant neoplasm of trachea, bronchus and lung: Secondary | ICD-10-CM | POA: Diagnosis not present

## 2014-06-18 DIAGNOSIS — N393 Stress incontinence (female) (male): Secondary | ICD-10-CM | POA: Diagnosis present

## 2014-06-18 DIAGNOSIS — I739 Peripheral vascular disease, unspecified: Secondary | ICD-10-CM | POA: Diagnosis present

## 2014-06-18 DIAGNOSIS — N390 Urinary tract infection, site not specified: Secondary | ICD-10-CM

## 2014-06-18 DIAGNOSIS — E785 Hyperlipidemia, unspecified: Secondary | ICD-10-CM | POA: Diagnosis present

## 2014-06-18 DIAGNOSIS — E1121 Type 2 diabetes mellitus with diabetic nephropathy: Secondary | ICD-10-CM | POA: Diagnosis present

## 2014-06-18 DIAGNOSIS — I10 Essential (primary) hypertension: Secondary | ICD-10-CM

## 2014-06-18 DIAGNOSIS — M7989 Other specified soft tissue disorders: Secondary | ICD-10-CM | POA: Diagnosis not present

## 2014-06-18 DIAGNOSIS — E872 Acidosis: Secondary | ICD-10-CM | POA: Diagnosis present

## 2014-06-18 DIAGNOSIS — E11621 Type 2 diabetes mellitus with foot ulcer: Secondary | ICD-10-CM | POA: Diagnosis present

## 2014-06-18 DIAGNOSIS — E1165 Type 2 diabetes mellitus with hyperglycemia: Secondary | ICD-10-CM | POA: Diagnosis present

## 2014-06-18 DIAGNOSIS — E1169 Type 2 diabetes mellitus with other specified complication: Secondary | ICD-10-CM | POA: Insufficient documentation

## 2014-06-18 DIAGNOSIS — E119 Type 2 diabetes mellitus without complications: Secondary | ICD-10-CM

## 2014-06-18 DIAGNOSIS — M009 Pyogenic arthritis, unspecified: Secondary | ICD-10-CM | POA: Insufficient documentation

## 2014-06-18 DIAGNOSIS — N189 Chronic kidney disease, unspecified: Secondary | ICD-10-CM | POA: Diagnosis present

## 2014-06-18 DIAGNOSIS — Z833 Family history of diabetes mellitus: Secondary | ICD-10-CM

## 2014-06-18 DIAGNOSIS — K5900 Constipation, unspecified: Secondary | ICD-10-CM | POA: Diagnosis present

## 2014-06-18 DIAGNOSIS — M868X7 Other osteomyelitis, ankle and foot: Secondary | ICD-10-CM | POA: Diagnosis not present

## 2014-06-18 DIAGNOSIS — L97509 Non-pressure chronic ulcer of other part of unspecified foot with unspecified severity: Secondary | ICD-10-CM

## 2014-06-18 DIAGNOSIS — L97414 Non-pressure chronic ulcer of right heel and midfoot with necrosis of bone: Secondary | ICD-10-CM | POA: Diagnosis not present

## 2014-06-18 DIAGNOSIS — M86171 Other acute osteomyelitis, right ankle and foot: Secondary | ICD-10-CM

## 2014-06-18 DIAGNOSIS — L0390 Cellulitis, unspecified: Secondary | ICD-10-CM

## 2014-06-18 DIAGNOSIS — N814 Uterovaginal prolapse, unspecified: Secondary | ICD-10-CM | POA: Diagnosis not present

## 2014-06-18 LAB — URINALYSIS, ROUTINE W REFLEX MICROSCOPIC
BILIRUBIN URINE: NEGATIVE
Glucose, UA: 100 mg/dL — AB
Ketones, ur: NEGATIVE mg/dL
NITRITE: NEGATIVE
PH: 6 (ref 5.0–8.0)
Protein, ur: 100 mg/dL — AB
SPECIFIC GRAVITY, URINE: 1.017 (ref 1.005–1.030)
Urobilinogen, UA: 0.2 mg/dL (ref 0.0–1.0)

## 2014-06-18 LAB — BASIC METABOLIC PANEL
Anion gap: 11 (ref 5–15)
BUN: 32 mg/dL — AB (ref 6–23)
CALCIUM: 9 mg/dL (ref 8.4–10.5)
CO2: 25 mmol/L (ref 19–32)
Chloride: 95 mmol/L — ABNORMAL LOW (ref 96–112)
Creatinine, Ser: 2.62 mg/dL — ABNORMAL HIGH (ref 0.50–1.10)
GFR, EST AFRICAN AMERICAN: 20 mL/min — AB (ref >90)
GFR, EST NON AFRICAN AMERICAN: 17 mL/min — AB (ref >90)
Glucose, Bld: 307 mg/dL — ABNORMAL HIGH (ref 70–99)
POTASSIUM: 4.3 mmol/L (ref 3.5–5.1)
SODIUM: 131 mmol/L — AB (ref 135–145)

## 2014-06-18 LAB — CBC WITH DIFFERENTIAL/PLATELET
BASOS PCT: 0 % (ref 0–1)
Basophils Absolute: 0 10*3/uL (ref 0.0–0.1)
EOS ABS: 0 10*3/uL (ref 0.0–0.7)
Eosinophils Relative: 0 % (ref 0–5)
HCT: 32.5 % — ABNORMAL LOW (ref 36.0–46.0)
HEMOGLOBIN: 10.7 g/dL — AB (ref 12.0–15.0)
LYMPHS ABS: 1.2 10*3/uL (ref 0.7–4.0)
Lymphocytes Relative: 19 % (ref 12–46)
MCH: 26.9 pg (ref 26.0–34.0)
MCHC: 32.9 g/dL (ref 30.0–36.0)
MCV: 81.7 fL (ref 78.0–100.0)
MONO ABS: 0.4 10*3/uL (ref 0.1–1.0)
MONOS PCT: 7 % (ref 3–12)
NEUTROS PCT: 74 % (ref 43–77)
Neutro Abs: 4.8 10*3/uL (ref 1.7–7.7)
Platelets: 345 10*3/uL (ref 150–400)
RBC: 3.98 MIL/uL (ref 3.87–5.11)
RDW: 13.1 % (ref 11.5–15.5)
WBC: 6.4 10*3/uL (ref 4.0–10.5)

## 2014-06-18 LAB — I-STAT CG4 LACTIC ACID, ED
LACTIC ACID, VENOUS: 2.04 mmol/L — AB (ref 0.5–2.0)
Lactic Acid, Venous: 1.12 mmol/L (ref 0.5–2.0)
Lactic Acid, Venous: 1.38 mmol/L (ref 0.5–2.0)

## 2014-06-18 LAB — SEDIMENTATION RATE: SED RATE: 119 mm/h — AB (ref 0–22)

## 2014-06-18 LAB — URINE MICROSCOPIC-ADD ON

## 2014-06-18 MED ORDER — INSULIN ASPART 100 UNIT/ML ~~LOC~~ SOLN
0.0000 [IU] | Freq: Three times a day (TID) | SUBCUTANEOUS | Status: DC
  Administered 2014-06-19 (×2): 5 [IU] via SUBCUTANEOUS
  Administered 2014-06-19: 9 [IU] via SUBCUTANEOUS
  Administered 2014-06-20 (×2): 3 [IU] via SUBCUTANEOUS

## 2014-06-18 MED ORDER — PIPERACILLIN-TAZOBACTAM 3.375 G IVPB
3.3750 g | Freq: Once | INTRAVENOUS | Status: AC
  Administered 2014-06-18: 3.375 g via INTRAVENOUS
  Filled 2014-06-18: qty 50

## 2014-06-18 MED ORDER — AMLODIPINE BESYLATE 5 MG PO TABS
5.0000 mg | ORAL_TABLET | Freq: Every day | ORAL | Status: DC
  Administered 2014-06-19 – 2014-06-23 (×5): 5 mg via ORAL
  Filled 2014-06-18 (×5): qty 1

## 2014-06-18 MED ORDER — VANCOMYCIN HCL IN DEXTROSE 1-5 GM/200ML-% IV SOLN
1000.0000 mg | Freq: Once | INTRAVENOUS | Status: AC
  Administered 2014-06-18: 1000 mg via INTRAVENOUS
  Filled 2014-06-18: qty 200

## 2014-06-18 MED ORDER — INSULIN GLARGINE 100 UNIT/ML ~~LOC~~ SOLN
10.0000 [IU] | Freq: Every day | SUBCUTANEOUS | Status: DC
  Administered 2014-06-19: 10 [IU] via SUBCUTANEOUS
  Filled 2014-06-18 (×2): qty 0.1

## 2014-06-18 MED ORDER — SODIUM CHLORIDE 0.9 % IV SOLN
INTRAVENOUS | Status: DC
  Administered 2014-06-18: via INTRAVENOUS

## 2014-06-18 NOTE — ED Provider Notes (Signed)
CSN: [id number]     Arrival date & time 06/18/14  1233 History   First MD Initiated Contact with Patient 06/18/14 1511     Chief Complaint  Patient presents with  . Dysuria     (Consider location/radiation/quality/duration/timing/severity/associated sxs/prior Treatment) Patient is a 74 y.o. female presenting with dysuria.  Dysuria Associated symptoms: no abdominal pain, no fever, no nausea and no vomiting    Ruth Douglas is a 74 year old female past medical history of diabetes, hypertension, CHF who presents the ER with 2 complaints. Patient states over the past 3 days she has been experiencing a burning sensation when she urinates, and a persistent discomfort after urinating and feeling of incomplete emptying. Patient states she was recently seen at [facility] for the same complaint yesterday, diagnosed with urinary tract infection given Bactrim to take as outpatient. Patient states over the past 24 hours she has been compliant with this medication, however has had no relief of her symptoms. Patient states she is also presenting today for complaint of an infection in her right foot. 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 Patient states her past several months she has had a sore on the bottom of her foot. She states over the past 5 days she has noticed associated purulent discharge from the sore and increasing erythema surrounding. Patient denies fever, nausea, vomiting, abdominal pain, diarrhea, constipation or chest pain, shortness of breath.  Past Medical History  Diagnosis Date  . Renal disorder   . Blood transfusion without reported diagnosis   . Diabetes mellitus without complication   . Hypertension   . CHF (congestive heart failure)    Past Surgical History  Procedure Laterality Date  . Toe amputation    . Cesarean section    . Cystoscopy with stent placement  01/16/2012    Procedure: CYSTOSCOPY WITH STENT PLACEMENT;  Surgeon: Dutch Gray, MD;  Location: [facility];  Service: Urology;  Laterality: Left;  cysto, retrograde , ureteroscopy , stone extraction with laser lithotripsy, stent placement on left, bladder biopsies with fulgaration  . Holmium laser application  19/75/8832    Procedure: HOLMIUM LASER APPLICATION;  Surgeon: Dutch Gray, MD;  Location: [facility];  Service: Urology;  Laterality: Left;  . Amputation Left 06/01/2012    Procedure: LEFT FOOT TRANSMETATARSEL AMPUTATION;  Surgeon: Wylene Simmer, MD;  Location: [place];  Service: Orthopedics;  Laterality: Left;  . Achilles tendon surgery Left 06/01/2012    Procedure: PERCUTANEOUS TENDON ACHILLES LENGTHING;  Surgeon: Wylene Simmer, MD;  Location: [place];  Service: Orthopedics;  Laterality: Left;   Family History  Problem Relation Age of Onset  . Diabetes Mellitus II Mother   . Diabetes Mellitus II Father   . Lung cancer Sister   . Kidney cancer Brother    History  Substance Use Topics  . Smoking status: Never Smoker   . Smokeless tobacco: Not on file  . Alcohol Use: No   OB History    No data available     Review of Systems  Constitutional: Negative for fever.  HENT: Negative for trouble swallowing.   Eyes: Negative for visual disturbance.  Respiratory: Negative for shortness of breath.   Cardiovascular: Negative for chest pain.  Gastrointestinal: Negative for nausea, vomiting and abdominal pain.  Genitourinary: Positive for dysuria.  Musculoskeletal: Negative for neck pain.  Skin: Positive for wound. Negative for rash.  Neurological: Negative for dizziness, weakness and numbness.  Psychiatric/Behavioral: Negative.  Allergies  Review of patient's allergies indicates no known allergies.  Home Medications   Prior to Admission medications   Medication Sig Start Date End Date Taking? Authorizing Provider  amLODipine (NORVASC) 5 MG tablet Take 5 mg by mouth daily.   Yes Historical Provider, MD  insulin glargine (LANTUS) 100 UNIT/ML injection Inject 0.1  mLs (10 Units total) into the skin at bedtime. 06/04/12  Yes Ruth A Regalado, MD  metFORMIN (GLUCOPHAGE) 1000 MG tablet Take 1,000 mg by mouth 2 (two) times daily with a meal.   Yes Historical Provider, MD  sulfamethoxazole-trimethoprim (BACTRIM DS,SEPTRA DS) 800-160 MG per tablet Take 1 tablet by mouth 2 (two) times daily. Started on 06-17-14 for 10 day therapy   Yes Historical Provider, MD  atorvastatin (LIPITOR) 40 MG tablet Take 40 mg by mouth daily.    Historical Provider, MD  ferrous sulfate 325 (65 FE) MG tablet Take 1 tablet (325 mg total) by mouth 2 (two) times daily with a meal. 06/04/12   Ruth A Regalado, MD   BP 131/50 mmHg  Pulse 78  Temp(Src) 98.9 F (37.2 C) (Oral)  Resp 20  Ht 5\' 3"  (1.6 m)  Wt 150 lb (68.04 kg)  BMI 26.58 kg/m2  SpO2 99% Physical Exam  Constitutional: She is oriented to person, place, and time. She appears well-developed and well-nourished. No distress.  HENT:  Head: Normocephalic and atraumatic.  Mouth/Throat: Oropharynx is clear and moist. No oropharyngeal exudate.  Eyes: Right eye exhibits no discharge. Left eye exhibits no discharge. No scleral icterus.  Neck: Normal range of motion.  Cardiovascular: Normal rate, regular rhythm, S1 normal, S2 normal and normal heart sounds.   No murmur heard. Pulmonary/Chest: Effort normal and breath sounds normal. No accessory muscle usage. No respiratory distress.  Abdominal: Soft. Normal appearance and bowel sounds are normal. There is no tenderness. There is no rigidity, no guarding, no tenderness at McBurney's point and negative Murphy's sign.  Musculoskeletal: Normal range of motion. She exhibits no edema or tenderness.  Bilateral toe amputation. Right foot has great toe, third and fourth toes remaining. 1 x 1 cm wound noted to plantar aspect of distal metatarsals. Surrounding erythema associated, extending to dorsal aspect of her distal metatarsals and toes.  Neurological: She is alert and oriented to person,  place, and time. She has normal strength. No cranial nerve deficit or sensory deficit. Coordination normal. GCS eye subscore is 4. GCS verbal subscore is 5. GCS motor subscore is 6.  Patient fully alert, answering questions appropriately in full, clear sentences. Cranial nerves II through XII grossly intact. Motor strength 5 out of 5 in all major muscle groups of upper and lower extremities. Distal sensation intact.   Skin: Skin is warm and dry. No rash noted. She is not diaphoretic.  Psychiatric: She has a normal mood and affect.  Nursing note and vitals reviewed.   ED Course  Procedures (including critical care time) Labs Review Labs Reviewed  URINALYSIS, ROUTINE W REFLEX MICROSCOPIC - Abnormal; Notable for the following:    APPearance TURBID (*)    Glucose, UA 100 (*)    Hgb urine dipstick LARGE (*)    Protein, ur 100 (*)    Leukocytes, UA LARGE (*)    All other components within normal limits  CBC WITH DIFFERENTIAL/PLATELET - Abnormal; Notable for the following:    Hemoglobin 10.7 (*)    HCT 32.5 (*)    All other components within normal limits  SEDIMENTATION RATE - Abnormal; Notable for  the following:    Sed Rate 119 (*)    All other components within normal limits  URINE MICROSCOPIC-ADD ON - Abnormal; Notable for the following:    Squamous Epithelial / LPF FEW (*)    Bacteria, UA FEW (*)    All other components within normal limits  BASIC METABOLIC PANEL - Abnormal; Notable for the following:    Sodium 131 (*)    Chloride 95 (*)    Glucose, Bld 307 (*)    BUN 32 (*)    Creatinine, Ser 2.62 (*)    GFR calc non Af Amer 17 (*)    GFR calc Af Amer 20 (*)    All other components within normal limits  I-STAT CG4 LACTIC ACID, ED - Abnormal; Notable for the following:    Lactic Acid, Venous 2.04 (*)    All other components within normal limits  URINE CULTURE  CULTURE, BLOOD (ROUTINE X 2)  CULTURE, BLOOD (ROUTINE X 2)  C-REACTIVE PROTEIN  I-STAT CG4 LACTIC ACID, ED  I-STAT  CG4 LACTIC ACID, ED    Imaging Review Dg Foot Complete Right  06/18/2014   CLINICAL DATA:  Swelling RIGHT toes for 1 week, open wound at bottom of RIGHT foot at MTP joint region, history diabetes  EXAM: RIGHT FOOT COMPLETE - 3+ VIEW  COMPARISON:  01/15/2012  FINDINGS: Prior amputations of great toe and of second toe at the mid metatarsal level.  Dislocation at third MTP joint.  Diffuse osseous demineralization and scattered soft tissue swelling.  Scattered intertarsal degenerative changes.  Mild sclerosis of the distal second metatarsal similar to previous exam question old healed fracture or stress response.  No definite acute fracture, dislocation or bone destruction.  IMPRESSION: Amputations of the first and second toes.  Osseous demineralization with scattered degenerative changes and soft tissue swelling.  No definite acute osseous findings.  If patient has persistent clinical suspicion of osteomyelitis or deep soft tissue infection, recommend MR.   Electronically Signed   By: Lavonia Dana M.D.   On: 06/18/2014 16:57     EKG Interpretation None      MDM   Final diagnoses:  Wound cellulitis    Patient here with several complaints. Patient has some dysuria which has been treated with Bactrim, currently does have a urinary tract infection, although patient secondary to display which is a foot ulcer with associated erythema, does present as being consistent with a cellulitis from a diabetic foot ulcer. We'll send culture for urine. Patient's lab work and presentation are not concerning for any systemic signs or symptoms, no concern for sepsis. Patient treated with antibiotics IV here. Patient also noted to have acute kidney injury on her renal function. Patient hemodynamically stable, admitted for cellulitis of diabetic wound. The patient appears reasonably stabilized for admission considering the current resources, flow, and capabilities available in the ED at this time, and I doubt any other [facility]  requiring further screening and/or treatment in the ED prior to admission.  BP 131/50 mmHg  Pulse 78  Temp(Src) 98.9 F (37.2 C) (Oral)  Resp 20  Ht 5\' 3"  (1.6 m)  Wt 150 lb (68.04 kg)  BMI 26.58 kg/m2  SpO2 99%  Signed,  Dahlia Bailiff, PA-C 10:07 PM  Patient seen and discussed with Dr. Ezequiel Essex, MD     Dahlia Bailiff, PA-C 06/18/14 2207  Ezequiel Essex, MD 06/19/14 0300

## 2014-06-18 NOTE — Progress Notes (Signed)
Received report from ED. Linsey Hirota Thacker, RN 

## 2014-06-18 NOTE — H&P (Signed)
Date: 06/18/2014               Patient Name:  Ruth Douglas MRN: [id number]  DOB: 05/30/1940 Age / Sex: 74 y.o., female   PCP: No Pcp Per Patient              Medical Service: Internal Medicine Teaching Service              Attending Physician: Dr. Madilyn Fireman, MD    First Contact: Dr. Ethelene Hal Pager: [phone]  Second Contact: Dr. Gordy Levan Pager: [phone]            After Hours (After 5p/  First Contact Pager: [phone]  weekends / holidays): Second Contact Pager: [phone]   Chief Complaint:  I think my bladder fell out.  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 She states that over the last 3 days she's been having burning with urination and persistent discomfort and a feeling of incomplete emptying of her bladder. She was seen at [facility] yesterday, and she was given Bactrim for urinary tract infection. She has been taking this over the last 24 hours, but she has not had relief of her symptoms. She also reports developing a callus on the bottom of her right foot several months ago. She reports picking at it 3 days ago and noticing a purulent discharge. Over the past few days, she is also noticing increasing erythema surrounding the ulcer.  She says that she is unable to feel anything in her feet bilaterally. She denies fever, chills, nausea, vomiting, shortness of breath, or chest pain.  In the ER, she was afebrile but slightly tachycardic initially to 104. Blood cultures were obtained, and she was given vancomycin and Zosyn IV.  Review of Systems: Review of Systems  Constitutional: Negative for fever, chills, weight loss and malaise/fatigue.  HENT: Negative for congestion and sore throat.   Eyes: Negative for blurred vision and pain.  Respiratory: Negative for  cough, shortness of breath and wheezing.   Cardiovascular: Negative for chest pain, palpitations, claudication and leg swelling.  Gastrointestinal: Negative for heartburn, nausea, vomiting, abdominal pain, diarrhea and melena.  Genitourinary: Positive for dysuria and frequency. Negative for flank pain.  Musculoskeletal: Negative for myalgias, back pain and joint pain.  Neurological: Positive for tingling. Negative for dizziness, sensory change, focal weakness, weakness and headaches.    Meds:  (Not in a hospital admission) Current Facility-Administered Medications  Medication Dose Route Frequency Provider Last Rate Last Dose  . [START ON 06/19/2014] 0.9 %  sodium chloride infusion   Intravenous Continuous Francesca Oman, DO 50 mL/hr at 06/18/14 2354    . [START ON 06/19/2014] amLODipine (NORVASC) tablet 5 mg  5 mg Oral Daily Francesca Oman, DO       Current Outpatient Prescriptions  Medication Sig Dispense Refill  . amLODipine (NORVASC) 5 MG tablet Take 5 mg by mouth daily.    . insulin glargine (LANTUS) 100 UNIT/ML injection Inject 0.1 mLs (10 Units total) into the skin at bedtime. 10 mL 0  . metFORMIN (GLUCOPHAGE) 1000 MG tablet Take 1,000 mg by mouth 2 (two) times daily with a meal.    . sulfamethoxazole-trimethoprim (BACTRIM DS,SEPTRA DS) 800-160 MG per tablet Take 1 tablet by mouth 2 (two) times daily. Started on 06-17-14 for 10 day therapy    .  atorvastatin (LIPITOR) 40 MG tablet Take 40 mg by mouth daily.    . ferrous sulfate 325 (65 FE) MG tablet Take 1 tablet (325 mg total) by mouth 2 (two) times daily with a meal. 60 tablet 0    Allergies: Allergies as of 06/18/2014  . (No Known Allergies)   Past Medical History  Diagnosis Date  . Renal disorder   . Blood transfusion without reported diagnosis   . Diabetes mellitus without complication   . Hypertension   . CHF (congestive heart failure)    Past Surgical History  Procedure Laterality Date  . Toe amputation    . Cesarean  section    . Cystoscopy with stent placement  01/16/2012    Procedure: CYSTOSCOPY WITH STENT PLACEMENT;  Surgeon: Dutch Gray, MD;  Location: [facility];  Service: Urology;  Laterality: Left;  cysto, retrograde , ureteroscopy , stone extraction with laser lithotripsy, stent placement on left, bladder biopsies with fulgaration  . Holmium laser application  47/10/6281    Procedure: HOLMIUM LASER APPLICATION;  Surgeon: Dutch Gray, MD;  Location: [facility];  Service: Urology;  Laterality: Left;  . Amputation Left 06/01/2012    Procedure: LEFT FOOT TRANSMETATARSEL AMPUTATION;  Surgeon: Wylene Simmer, MD;  Location: [place];  Service: Orthopedics;  Laterality: Left;  . Achilles tendon surgery Left 06/01/2012    Procedure: PERCUTANEOUS TENDON ACHILLES LENGTHING;  Surgeon: Wylene Simmer, MD;  Location: [place];  Service: Orthopedics;  Laterality: Left;   Family History  Problem Relation Age of Onset  . Diabetes Mellitus II Mother   . Diabetes Mellitus II Father   . Lung cancer Sister   . Kidney cancer Brother    History   Social History  . Marital Status: Married    Spouse Name: N/A  . Number of Children: N/A  . Years of Education: N/A   Occupational History  . Not on file.   Social History Main Topics  . Smoking status: Never Smoker   . Smokeless tobacco: Not on file  . Alcohol Use: No  . Drug Use: No  . Sexual Activity: Not on file   Other Topics Concern  . Not on file   Social History Narrative    Physical Exam: Filed Vitals:   06/18/14 2345  BP: 156/68  Pulse: 78  Temp: 98.6  Resp: 16   Physical Exam  Constitutional: She is well-developed, well-nourished, and in no distress. No distress.  Appears younger than stated age.  HENT:  Head: Normocephalic and atraumatic.  Eyes: Conjunctivae and EOM are normal. Pupils are equal, round, and reactive to light. No scleral icterus.  Neck: Normal range of motion. Neck supple.  Cardiovascular: Normal rate, regular rhythm, normal heart sounds and  intact distal pulses.   Good DP pulses bilaterally.  Pulmonary/Chest: Breath sounds normal. No respiratory distress. She has no wheezes.  Abdominal: Soft. Bowel sounds are normal. She exhibits no distension. There is no tenderness.  Musculoskeletal: Normal range of motion. She exhibits no edema or tenderness.  Varicose veins bilaterally. Status post amputation of all of left foot digits and first and second digit on right foot.  Deformed R 3rd digit.  Skin: She is not diaphoretic. There is erythema (On dorsal foot.).  1 cm ulcer with purulent drainage on plantar aspect of right foot (see photo below).  Probe 5 mm deep.        Lab results: Basic Metabolic Panel:  Recent Labs  06/18/14 1922  NA 131*  K 4.3  CL  95*  CO2 25  GLUCOSE 307*  BUN 32*  CREATININE 2.62*  CALCIUM 9.0   CBC:  Recent Labs  06/18/14 1615  WBC 6.4  NEUTROABS 4.8  HGB 10.7*  HCT 32.5*  MCV 81.7  PLT 345   Urinalysis:  Recent Labs  06/18/14 1620  COLORURINE YELLOW  LABSPEC 1.017  PHURINE 6.0  GLUCOSEU 100*  HGBUR LARGE*  BILIRUBINUR NEGATIVE  KETONESUR NEGATIVE  PROTEINUR 100*  UROBILINOGEN 0.2  NITRITE NEGATIVE  LEUKOCYTESUR LARGE*  Few squamous epithelial cells, too numerous to count WBCs, 0-2 RBCs, few bacteria.  Erythrocyte Sedimentation Rate     Component Value Date/Time   ESRSEDRATE 119* 06/18/2014 1615   Lactic Acid, Venous 2.04>1.12>1.38.  Imaging results:  Dg Foot Complete Right  06/18/2014   CLINICAL DATA:  Swelling RIGHT toes for 1 week, open wound at bottom of RIGHT foot at MTP joint region, history diabetes  EXAM: RIGHT FOOT COMPLETE - 3+ VIEW  COMPARISON:  01/15/2012  FINDINGS: Prior amputations of great toe and of second toe at the mid metatarsal level.  Dislocation at third MTP joint.  Diffuse osseous demineralization and scattered soft tissue swelling.  Scattered intertarsal degenerative changes.  Mild sclerosis of the distal second metatarsal similar to previous  exam question old healed fracture or stress response.  No definite acute fracture, dislocation or bone destruction.  IMPRESSION: Amputations of the first and second toes.  Osseous demineralization with scattered degenerative changes and soft tissue swelling.  No definite acute osseous findings.  If patient has persistent clinical suspicion of osteomyelitis or deep soft tissue infection, recommend MR.   Electronically Signed   By: Lavonia Dana M.D.   On: 06/18/2014 16:57   Assessment & Plan by Problem: Principal Problem:   Diabetic foot ulcer Active Problems:   DM2 (diabetes mellitus, type 2)   AKI (acute kidney injury)   Hyperlipidemia   Anemia   Diabetic foot infection   #Infected diabetic foot ulcer Pus draining from right diabetic foot ulcer, history of multiple amputations. Patient was tachycardic on presentation with an elevated lactic acid. Some osseous demineralization on x-ray, but no definitive osteomyelitis. ESR elevated and CRP pending. Given tachycardia and lactic acidosis, we'll continue with antibiotics. Lactic acid currently normal. -Admit to MedSurg. -Consult orthopedics in the morning. -Ceftriaxone and metronidazole per diabetic foot order set. -Normal saline at 50 mL per hour. -MRI of right foot without contrast to evaluate for osteomyelitis. -Tylenol 650 mg every 6 hours as needed for pain. -Follow-up CRP. -Follow-up blood culture. -Wound care consult. -Carb-modified diet, nothing by mouth at midnight for possible procedure. -PT and OT eval and treat.  #Urinary tract infection Started treatment yesterday with continued symptoms. No fever or costovertebral tenderness. -Antibiotics as above. -Follow-up urine culture.  #Acute on chronic kidney disease Creatinine elevated to 2.62 on presentation, up from 1.08 in 2014. This may represent progressive CKD or possible prerenal kidney injury from infection. -Trend creatinine.  #Type 2 diabetes Glucose elevated to 307 on  presentation, likely poor long-term control of diabetes. Hemoglobin A1c was only 7.3 in 2013. Reports not taking in metformin currently. -Repeat hemoglobin A1c. -Continue home Lantus 10 units daily at bedtime. -CBGs before meals and at bedtime with sliding scale insulin. -Consult diabetes coordinator.  #Anemia Hemoglobin elevated from previous baseline in 2014 of approximately 8. No previous anemia panel in our system. She was previously on ferrous sulfate. -Consider anemia panel. -Trend hemoglobin.  #Hyperlipidemia Reports not taking atorvastatin 40 mg daily. -Consider restarting during  hospitalization.  #Hypertension Currently normotensive. -Resume home amlodipine 5 mg daily in the morning.  #DVT prophylaxis -Heparin.  Dispo: Disposition is deferred at this time, awaiting improvement of current medical problems. Anticipated discharge in approximately 3-6 day(s).   The patient does not have a current PCP (No Pcp Per Patient), therefore will be require OPC follow-up after discharge.   The patient does have transportation limitations that hinder transportation to clinic appointments.   Signed:  Arman Filter, MD, PhD PGY-1 Internal Medicine Teaching Service Pager: [phone] 06/18/2014, 11:55 PM

## 2014-06-18 NOTE — ED Notes (Signed)
Lab notified of add on of BMP, BMP is in process

## 2014-06-18 NOTE — ED Notes (Signed)
Pt presents with 3 day h/o dysuria.  Pt reports "I think my bladder fell out", pt seen at [facility] for UTI, started on abx since yesterday.  Pt also reports 1 week h/o swelling to R toes.

## 2014-06-18 NOTE — ED Notes (Signed)
This RN called MRI to determine when pt will be transported to have her MRI done and I was informed that it will be maybe an hour. Pt updated.

## 2014-06-18 NOTE — ED Notes (Signed)
Admitting at bedside 

## 2014-06-19 ENCOUNTER — Inpatient Hospital Stay (HOSPITAL_COMMUNITY): Payer: Medicare FFS

## 2014-06-19 ENCOUNTER — Encounter (HOSPITAL_COMMUNITY): Payer: Self-pay | Admitting: *Deleted

## 2014-06-19 DIAGNOSIS — E872 Acidosis: Secondary | ICD-10-CM

## 2014-06-19 DIAGNOSIS — I10 Essential (primary) hypertension: Secondary | ICD-10-CM

## 2014-06-19 DIAGNOSIS — Z794 Long term (current) use of insulin: Secondary | ICD-10-CM

## 2014-06-19 DIAGNOSIS — M868X7 Other osteomyelitis, ankle and foot: Secondary | ICD-10-CM

## 2014-06-19 DIAGNOSIS — L97414 Non-pressure chronic ulcer of right heel and midfoot with necrosis of bone: Secondary | ICD-10-CM

## 2014-06-19 DIAGNOSIS — B9689 Other specified bacterial agents as the cause of diseases classified elsewhere: Secondary | ICD-10-CM

## 2014-06-19 DIAGNOSIS — M009 Pyogenic arthritis, unspecified: Secondary | ICD-10-CM | POA: Insufficient documentation

## 2014-06-19 DIAGNOSIS — E1165 Type 2 diabetes mellitus with hyperglycemia: Secondary | ICD-10-CM

## 2014-06-19 DIAGNOSIS — Z89411 Acquired absence of right great toe: Secondary | ICD-10-CM

## 2014-06-19 DIAGNOSIS — E1169 Type 2 diabetes mellitus with other specified complication: Secondary | ICD-10-CM | POA: Insufficient documentation

## 2014-06-19 DIAGNOSIS — I129 Hypertensive chronic kidney disease with stage 1 through stage 4 chronic kidney disease, or unspecified chronic kidney disease: Secondary | ICD-10-CM

## 2014-06-19 DIAGNOSIS — N189 Chronic kidney disease, unspecified: Secondary | ICD-10-CM

## 2014-06-19 DIAGNOSIS — R000 Tachycardia, unspecified: Secondary | ICD-10-CM

## 2014-06-19 DIAGNOSIS — N179 Acute kidney failure, unspecified: Secondary | ICD-10-CM

## 2014-06-19 DIAGNOSIS — E1142 Type 2 diabetes mellitus with diabetic polyneuropathy: Secondary | ICD-10-CM

## 2014-06-19 DIAGNOSIS — Z89422 Acquired absence of other left toe(s): Secondary | ICD-10-CM

## 2014-06-19 DIAGNOSIS — E785 Hyperlipidemia, unspecified: Secondary | ICD-10-CM

## 2014-06-19 DIAGNOSIS — N390 Urinary tract infection, site not specified: Secondary | ICD-10-CM

## 2014-06-19 DIAGNOSIS — D649 Anemia, unspecified: Secondary | ICD-10-CM

## 2014-06-19 DIAGNOSIS — Z89412 Acquired absence of left great toe: Secondary | ICD-10-CM

## 2014-06-19 DIAGNOSIS — E11621 Type 2 diabetes mellitus with foot ulcer: Secondary | ICD-10-CM

## 2014-06-19 DIAGNOSIS — E1122 Type 2 diabetes mellitus with diabetic chronic kidney disease: Secondary | ICD-10-CM

## 2014-06-19 DIAGNOSIS — Z89421 Acquired absence of other right toe(s): Secondary | ICD-10-CM

## 2014-06-19 DIAGNOSIS — M869 Osteomyelitis, unspecified: Secondary | ICD-10-CM

## 2014-06-19 LAB — IRON AND TIBC
IRON: 35 ug/dL — AB (ref 42–145)
SATURATION RATIOS: 14 % — AB (ref 20–55)
TIBC: 249 ug/dL — AB (ref 250–470)
UIBC: 214 ug/dL (ref 125–400)

## 2014-06-19 LAB — COMPREHENSIVE METABOLIC PANEL
ALT: 12 U/L (ref 0–35)
AST: 14 U/L (ref 0–37)
Albumin: 3 g/dL — ABNORMAL LOW (ref 3.5–5.2)
Alkaline Phosphatase: 66 U/L (ref 39–117)
Anion gap: 10 (ref 5–15)
BUN: 30 mg/dL — ABNORMAL HIGH (ref 6–23)
CHLORIDE: 96 mmol/L (ref 96–112)
CO2: 24 mmol/L (ref 19–32)
CREATININE: 2.38 mg/dL — AB (ref 0.50–1.10)
Calcium: 8.5 mg/dL (ref 8.4–10.5)
GFR calc Af Amer: 22 mL/min — ABNORMAL LOW (ref >90)
GFR calc non Af Amer: 19 mL/min — ABNORMAL LOW (ref >90)
Glucose, Bld: 301 mg/dL — ABNORMAL HIGH (ref 70–99)
Potassium: 4 mmol/L (ref 3.5–5.1)
Sodium: 130 mmol/L — ABNORMAL LOW (ref 135–145)
Total Bilirubin: 0.5 mg/dL (ref 0.3–1.2)
Total Protein: 6.9 g/dL (ref 6.0–8.3)

## 2014-06-19 LAB — CBC WITH DIFFERENTIAL/PLATELET
Basophils Absolute: 0 10*3/uL (ref 0.0–0.1)
Basophils Relative: 0 % (ref 0–1)
EOS ABS: 0.2 10*3/uL (ref 0.0–0.7)
EOS PCT: 3 % (ref 0–5)
HEMATOCRIT: 29.1 % — AB (ref 36.0–46.0)
Hemoglobin: 9.7 g/dL — ABNORMAL LOW (ref 12.0–15.0)
LYMPHS ABS: 1.6 10*3/uL (ref 0.7–4.0)
Lymphocytes Relative: 34 % (ref 12–46)
MCH: 27.6 pg (ref 26.0–34.0)
MCHC: 33.3 g/dL (ref 30.0–36.0)
MCV: 82.7 fL (ref 78.0–100.0)
MONO ABS: 0.5 10*3/uL (ref 0.1–1.0)
MONOS PCT: 9 % (ref 3–12)
Neutro Abs: 2.6 10*3/uL (ref 1.7–7.7)
Neutrophils Relative %: 54 % (ref 43–77)
Platelets: 315 10*3/uL (ref 150–400)
RBC: 3.52 MIL/uL — ABNORMAL LOW (ref 3.87–5.11)
RDW: 13.4 % (ref 11.5–15.5)
WBC: 4.8 10*3/uL (ref 4.0–10.5)

## 2014-06-19 LAB — C-REACTIVE PROTEIN: CRP: 6.5 mg/dL — ABNORMAL HIGH (ref <0.60)

## 2014-06-19 LAB — GLUCOSE, CAPILLARY
GLUCOSE-CAPILLARY: 213 mg/dL — AB (ref 70–99)
GLUCOSE-CAPILLARY: 292 mg/dL — AB (ref 70–99)
Glucose-Capillary: 251 mg/dL — ABNORMAL HIGH (ref 70–99)
Glucose-Capillary: 386 mg/dL — ABNORMAL HIGH (ref 70–99)
Glucose-Capillary: 225 mg/dL — ABNORMAL HIGH (ref 70–99)

## 2014-06-19 LAB — VITAMIN B12: Vitamin B-12: 628 pg/mL (ref 211–911)

## 2014-06-19 LAB — SODIUM, URINE, RANDOM: Sodium, Ur: 65 mmol/L

## 2014-06-19 LAB — RETICULOCYTES
RBC.: 3.56 MIL/uL — ABNORMAL LOW (ref 3.87–5.11)
RETIC COUNT ABSOLUTE: 32 10*3/uL (ref 19.0–186.0)
Retic Ct Pct: 0.9 % (ref 0.4–3.1)

## 2014-06-19 LAB — FERRITIN: Ferritin: 113 ng/mL (ref 10–291)

## 2014-06-19 LAB — FOLATE: Folate: >20 ng/mL

## 2014-06-19 LAB — CREATININE, URINE, RANDOM: Creatinine, Urine: 95.13 mg/dL

## 2014-06-19 MED ORDER — METRONIDAZOLE IN NACL 5-0.79 MG/ML-% IV SOLN
500.0000 mg | Freq: Three times a day (TID) | INTRAVENOUS | Status: DC
  Administered 2014-06-19 (×2): 500 mg via INTRAVENOUS
  Filled 2014-06-19 (×3): qty 100

## 2014-06-19 MED ORDER — ADULT MULTIVITAMIN W/MINERALS CH
1.0000 | ORAL_TABLET | Freq: Every day | ORAL | Status: DC
  Administered 2014-06-19 – 2014-06-23 (×5): 1 via ORAL
  Filled 2014-06-19 (×5): qty 1

## 2014-06-19 MED ORDER — CEFTRIAXONE SODIUM IN DEXTROSE 40 MG/ML IV SOLN
2.0000 g | Freq: Every day | INTRAVENOUS | Status: DC
  Administered 2014-06-19: 2 g via INTRAVENOUS
  Filled 2014-06-19 (×2): qty 50

## 2014-06-19 MED ORDER — CEFAZOLIN SODIUM-DEXTROSE 2-3 GM-% IV SOLR
2.0000 g | INTRAVENOUS | Status: AC
  Administered 2014-06-20: 2 g via INTRAVENOUS
  Filled 2014-06-19 (×2): qty 50

## 2014-06-19 MED ORDER — ACETAMINOPHEN 325 MG PO TABS: 650.0000 mg | ORAL_TABLET | Freq: Four times a day (QID) | ORAL | Status: DC | PRN

## 2014-06-19 MED ORDER — VANCOMYCIN HCL IN DEXTROSE 750-5 MG/150ML-% IV SOLN
750.0000 mg | INTRAVENOUS | Status: AC
  Administered 2014-06-20 – 2014-06-22 (×3): 750 mg via INTRAVENOUS
  Filled 2014-06-19 (×3): qty 150

## 2014-06-19 MED ORDER — GLUCERNA SHAKE PO LIQD
237.0000 mL | Freq: Two times a day (BID) | ORAL | Status: DC
  Administered 2014-06-19 – 2014-06-23 (×4): 237 mL via ORAL

## 2014-06-19 MED ORDER — PIPERACILLIN-TAZOBACTAM IN DEX 2-0.25 GM/50ML IV SOLN
2.2500 g | Freq: Three times a day (TID) | INTRAVENOUS | Status: DC
  Administered 2014-06-19 – 2014-06-21 (×6): 2.25 g via INTRAVENOUS
  Filled 2014-06-19 (×8): qty 50

## 2014-06-19 MED ORDER — SODIUM CHLORIDE 0.45 % IV SOLN
INTRAVENOUS | Status: DC
  Administered 2014-06-20: 02:00:00 via INTRAVENOUS

## 2014-06-19 MED ORDER — INSULIN GLARGINE 100 UNIT/ML ~~LOC~~ SOLN
17.0000 [IU] | Freq: Every day | SUBCUTANEOUS | Status: DC
  Administered 2014-06-19: 17 [IU] via SUBCUTANEOUS
  Filled 2014-06-19 (×2): qty 0.17

## 2014-06-19 MED ORDER — CHLORHEXIDINE GLUCONATE 4 % EX LIQD
60.0000 mL | Freq: Once | CUTANEOUS | Status: AC
  Administered 2014-06-20: 4 via TOPICAL
  Filled 2014-06-19 (×2): qty 60

## 2014-06-19 MED ORDER — VANCOMYCIN HCL IN DEXTROSE 1-5 GM/200ML-% IV SOLN
1000.0000 mg | Freq: Once | INTRAVENOUS | Status: AC
  Administered 2014-06-19: 1000 mg via INTRAVENOUS
  Filled 2014-06-19: qty 200

## 2014-06-19 MED ORDER — ACETAMINOPHEN 650 MG RE SUPP: 650.0000 mg | Freq: Four times a day (QID) | RECTAL | Status: DC | PRN

## 2014-06-19 MED ORDER — FERROUS SULFATE 325 (65 FE) MG PO TABS
325.0000 mg | ORAL_TABLET | Freq: Every day | ORAL | Status: DC
  Administered 2014-06-20 – 2014-06-23 (×4): 325 mg via ORAL
  Filled 2014-06-19 (×5): qty 1

## 2014-06-19 MED ORDER — SODIUM CHLORIDE 0.9 % IV SOLN
INTRAVENOUS | Status: AC
Start: 2014-06-19 — End: 2014-06-19
  Administered 2014-06-19: 02:00:00 via INTRAVENOUS

## 2014-06-19 MED ORDER — HEPARIN SODIUM (PORCINE) 5000 UNIT/ML IJ SOLN
5000.0000 [IU] | Freq: Three times a day (TID) | INTRAMUSCULAR | Status: DC
  Administered 2014-06-19 – 2014-06-23 (×10): 5000 [IU] via SUBCUTANEOUS
  Filled 2014-06-19 (×16): qty 1

## 2014-06-19 NOTE — Evaluation (Signed)
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 She states that over the last 3 days she's been having burning with urination and persistent discomfort and a feeling of incomplete emptying of her bladder. She was seen at [facility] yesterday, and she was given Bactrim for urinary tract infection. She has been taking this over the last 24 hours, but she has not had relief of her symptoms. She also reports developing a callus on the bottom of her right foot several months ago. She reports picking at it 3 days ago and noticing a purulent discharge. Over the past few days, she is also noticing increasing erythema surrounding the ulcer. She says that she is unable to feel anything in her feet bilaterally.  Clinical Impression  Pt admitted with above diagnosis. Pt currently with functional limitations due to the deficits listed below (see PT Problem List). At the time of PT eval pt was able to perform transfers and ambulation with mod I or supervision for safety. Per RN, pt is currently WBAT, however if pt has a decrease in weight bearing or any procedures for her R plantar ulcer, function/goals may change. Pt will benefit from skilled PT to increase their independence and safety with mobility to allow discharge to the venue listed below.      Follow Up Recommendations Other (comment);No PT follow up (Subject to change if WB status is decreased due to ulcer)    Equipment Recommendations  None recommended by PT (Subject to change if WB status is decreased due to ulcer)    Recommendations for Other Services       Precautions / Restrictions Precautions Precautions: Fall Precaution Comments:  Decreased sensation in bilateral feet - R>L. Ulcer is on R side.  Restrictions Weight Bearing Restrictions: Yes RLE Weight Bearing: Weight bearing as tolerated (Per RN on 06/19/14. No WB status orders noted.)      Mobility  Bed Mobility Overal bed mobility: Modified Independent Bed Mobility: Supine to Sit;Sit to Supine           General bed mobility comments: Pt was able to transition to/from EOB with no physical assistance. HOB slightly elevated.   Transfers Overall transfer level: Needs assistance Equipment used: None Transfers: Sit to/from Stand Sit to Stand: Modified independent (Device/Increase time)         General transfer comment: Pt was able to power-up to full standing without assistance. Slight unsteadiness noted which pt states is baseline due to toe amputations, however no physical assist required to recover.   Ambulation/Gait Ambulation/Gait assistance: Min guard Ambulation Distance (Feet): 200 Feet Assistive device: None Gait Pattern/deviations: Step-through pattern;Decreased stride length;Trunk flexed Gait velocity: Decreased Gait velocity interpretation: Below normal speed for age/gender General Gait Details: Pt was able to ambulate fairly well in the halls without assistance. No AD needed. Noted occasional unsteadiness due to previous toe amputations, and pt was guarded closely during these times without need for physical assist to recover.   Stairs            Wheelchair Mobility    Modified Rankin (Stroke Patients Only)       Balance Overall balance assessment: Needs assistance Sitting-balance support: Feet supported;No upper extremity supported Sitting balance-Leahy Scale: Good     Standing  balance support: No upper extremity supported Standing balance-Leahy Scale: Fair Standing balance comment: Close guard required.                              Pertinent Vitals/Pain Pain Assessment: 0-10 Pain Score: 4  Pain Location:  Abdomen - she reports burning in her bladder Pain Descriptors / Indicators: Burning Pain Intervention(s): Limited activity within patient's tolerance;Monitored during session;Repositioned    Home Living Family/patient expects to be discharged to:: Private residence Living Arrangements: Spouse/significant other (Children live across the street) Available Help at Discharge: Family;Available 24 hours/day Type of Home: House Home Access: Stairs to enter Entrance Stairs-Rails: Right Entrance Stairs-Number of Steps: 2 Home Layout: One level Home Equipment: Walker - 2 wheels;Cane - single point;Wheelchair - manual      Prior Function Level of Independence: Independent         Comments: No longer drives. Community ambulator     Hand Dominance   Dominant Hand: Right    Extremity/Trunk Assessment   Upper Extremity Assessment: Defer to OT evaluation           Lower Extremity Assessment: Generalized weakness;RLE deficits/detail RLE Deficits / Details: ulcer on plantar surface of foot    Cervical / Trunk Assessment: Normal  Communication   Communication: No difficulties  Cognition Arousal/Alertness: Awake/alert Behavior During Therapy: WFL for tasks assessed/performed Overall Cognitive Status: Within Functional Limits for tasks assessed                      General Comments      Exercises        Assessment/Plan    PT Assessment Patient needs continued PT services  PT Diagnosis Difficulty walking;Generalized weakness   PT Problem List Decreased strength;Decreased range of motion;Decreased activity tolerance;Decreased balance;Decreased mobility;Decreased knowledge of use of DME;Decreased safety awareness;Decreased knowledge of precautions;Decreased skin integrity  PT Treatment Interventions DME instruction;Gait training;Stair training;Functional mobility training;Therapeutic activities;Therapeutic exercise;Neuromuscular re-education;Patient/family education    PT Goals (Current goals can be found in the Care Plan section) Acute Rehab PT Goals Patient Stated Goal: Decrease burning during urination PT Goal Formulation: With patient/family Time For Goal Achievement: 06/26/14 Potential to Achieve Goals: Good    Frequency Min 3X/week   Barriers to discharge        Co-evaluation               End of Session Equipment Utilized During Treatment: Gait belt Activity Tolerance: Patient tolerated treatment well Patient left: in bed;with call bell/phone within reach;with family/visitor present Nurse Communication: Mobility status         Time: 6945-0388 PT Time Calculation (min) (ACUTE ONLY): 27 min   Charges:   PT Evaluation $Initial PT Evaluation Tier I: 1 Procedure PT Treatments $Gait Training: 8-22 mins   PT G Codes:        Rolinda Roan Jul 16, 2014, 1:28 PM   Rolinda Roan, PT, DPT Acute Rehabilitation Services Pager: [phone]

## 2014-06-19 NOTE — Progress Notes (Signed)
Inpatient Diabetes Program Recommendations  AACE/ADA: New Consensus Statement on Inpatient Glycemic Control (2013)  Target Ranges:  Prepandial:   less than 140 mg/dL      Peak postprandial:   less than 180 mg/dL (1-2 hours)      Critically ill patients:  140 - 180 mg/dL   Results for Ruth Douglas, Ruth Douglas (MRN [id number]) as of 06/19/2014 11:00  Ref. Range 06/19/2014 00:29 06/19/2014 07:45  Glucose-Capillary Latest Ref Range: 70-99 mg/dL 213 (H) 292 (H)   Diabetes history: DM2 Outpatient Diabetes medications: Lantus 10 units QHS, Metformin 1000 mg BID Current orders for Inpatient glycemic control: Lantus 10 units QHS, Novolog 0-9 units TID with meals  Inpatient Diabetes Program Recommendations Insulin - Basal: Please consider increasing Lantus to 17 units QHS (based on 67 kg x 0.25 units). Correction (SSI): Please consider adding Novolog bedtime correction scale.  Thanks, Barnie Alderman, RN, MSN, CCRN, CDE Diabetes Coordinator Inpatient Diabetes Program [phone] (Team Pager from [place] to [place]) [phone] (AP office) [phone] [facility] office)

## 2014-06-19 NOTE — Progress Notes (Signed)
Subjective: Ruth Douglas this morning still reports some dysuria with incontinence but says that the pain is improved from presentation. She still says that she has no fever, chills, night sweats and has no pain in her feet as she has no sensation.  Objective: Vital signs in last 24 hours: Filed Vitals:   06/18/14 2315 06/18/14 2345 06/19/14 0007 06/19/14 0504  BP: 146/65 156/68 161/62 110/69  Pulse: 80 78 80 79  Temp: 98.6 F (37 C)  98.5 F (36.9 C) 98.5 F (36.9 C)  TempSrc: Oral  Oral Oral  Resp: '16  18 18  ' Height:   '5\' 3"'  (1.6 m)   Weight:   143 lb 4.8 oz (65 kg) 148 lb 9.4 oz (67.4 kg)  SpO2: 98% 98% 100% 98%   Weight change:   Intake/Output Summary (Last 24 hours) at 06/19/14 1105 Last data filed at 06/19/14 0939  Gross per 24 hour  Intake    200 ml  Output      0 ml  Net    200 ml   Gen: A&O x 4, No acute distress, well developed, well nourished HEENT: Atraumatic, PERRL, EOMI, sclerae anicteric, moist mucous membranes Heart: Regular rate and rhythm, normal S1 S2, no murmurs, rubs, or gallops Lungs: Clear to auscultation bilaterally, respirations unlabored Abd: Soft, non-tender including suprapubic, no CVA tenderness, non-distended, + bowel sounds, no hepatosplenomegaly Ext: L foot s/p amputation of all digits, R foot s/p amputation of 1st and 2nd digit. R foot with ~ 1 cm x 1 cm ulcer on 3rd metatarsal pad with white purulent drainage, strong DP pulses  Lab Results: Basic Metabolic Panel:  Recent Labs Lab 06/18/14 1922 06/19/14 0820  NA 131* 130*  K 4.3 4.0  CL 95* 96  CO2 25 24  GLUCOSE 307* 301*  BUN 32* 30*  CREATININE 2.62* 2.38*  CALCIUM 9.0 8.5   Liver Function Tests:  Recent Labs Lab 06/19/14 0820  AST 14  ALT 12  ALKPHOS 66  BILITOT 0.5  PROT 6.9  ALBUMIN 3.0*   CBC:  Recent Labs Lab 06/18/14 1615 06/19/14 0820  WBC 6.4 4.8  NEUTROABS 4.8 2.6  HGB 10.7* 9.7*  HCT 32.5* 29.1*  MCV 81.7 82.7  PLT 345 315   CBG:  Recent  Labs Lab 06/19/14 0029 06/19/14 0745  GLUCAP 213* 292*   Anemia Panel:  Recent Labs Lab 06/19/14 0820  RETICCTPCT 0.9   Urinalysis:  Recent Labs Lab 06/18/14 1620  COLORURINE YELLOW  LABSPEC 1.017  PHURINE 6.0  GLUCOSEU 100*  HGBUR LARGE*  BILIRUBINUR NEGATIVE  KETONESUR NEGATIVE  PROTEINUR 100*  UROBILINOGEN 0.2  NITRITE NEGATIVE  LEUKOCYTESUR LARGE*  few squam, WBC too numerous to count, 0-2 RBC, few bacteria  Misc. Labs: Lactic acid 2.04-->1.12, 1.38 CRP 6.5 ESR 119  Micro Results: Recent Results (from the past 240 hour(s))  Blood culture (routine x 2)     Status: None (Preliminary result)   Collection Time: 06/18/14  4:15 PM  Result Value Ref Range Status   Specimen Description BLOOD ARM LEFT  Final   Special Requests BOTTLES DRAWN AEROBIC AND ANAEROBIC 5CC  Final   Culture   Final           BLOOD CULTURE RECEIVED NO GROWTH TO DATE CULTURE WILL BE HELD FOR 5 DAYS BEFORE ISSUING A FINAL NEGATIVE REPORT Performed at [facility]    Report Status PENDING  Incomplete  Blood culture (routine x 2)     Status: None (  Preliminary result)   Collection Time: 06/18/14  4:24 PM  Result Value Ref Range Status   Specimen Description BLOOD ARM RIGHT  Final   Special Requests BOTTLES DRAWN AEROBIC AND ANAEROBIC 5CC  Final   Culture   Final           BLOOD CULTURE RECEIVED NO GROWTH TO DATE CULTURE WILL BE HELD FOR 5 DAYS BEFORE ISSUING A FINAL NEGATIVE REPORT Performed at [facility]    Report Status PENDING  Incomplete   Studies/Results: Mr Foot Right Wo Contrast  06/19/2014   CLINICAL DATA:  Uncontrolled type 2 diabetes. Nephropathy and neuropathy. Neuropathy. Foot amputation. Osteomyelitis of the foot.  EXAM: MRI OF THE RIGHT FOREFOOT WITHOUT CONTRAST  TECHNIQUE: Multiplanar, multisequence MR imaging was performed. No intravenous contrast was administered.  COMPARISON:  Radiographs 06/18/2014.  FINDINGS: Osteomyelitis of the distal third metatarsal  is present. There is an ulcer tracking from the plantar aspect of the third metatarsal head. The phalanges of the third toe or dislocated, better demonstrated on prior plain films 06/18/2014. The ulcer tunnel is to the third metatarsal head and there is a small abscess along the dorsal third metatarsal head measuring 16 mm x 12 mm by 23 mm plantar to dorsal. The abscess is interposed between the third and fourth metatarsal heads.  Bone marrow edema radiates through the third metatarsal shaft to the base of the metatarsal. Neuropathic midfoot. There is a fourth MTP joint effusion which may be reactive or represent septic arthritis from the adjacent abscess. The intermetatarsal component of the abscess is best viewed on axial imaging (image 25 series 5). First and second toe amputations noted.  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 Abscess between the third and fourth metatarsal heads. Fourth MTP joint effusion which may be reactive or represent septic arthritis.   Electronically Signed   By: Dereck Ligas M.D.   On: 06/19/2014 08:23   Dg Foot Complete Right  06/18/2014   CLINICAL DATA:  Swelling RIGHT toes for 1 week, open wound at bottom of RIGHT foot at MTP joint region, history diabetes  EXAM: RIGHT FOOT COMPLETE - 3+ VIEW  COMPARISON:  01/15/2012  FINDINGS: Prior amputations of great toe and of second toe at the mid metatarsal level.  Dislocation at third MTP joint.  Diffuse osseous demineralization and scattered soft tissue swelling.  Scattered intertarsal degenerative changes.  Mild sclerosis of the distal second metatarsal similar to previous exam question old healed fracture or stress response.  No definite acute fracture, dislocation or bone destruction.  IMPRESSION: Amputations of the first and second toes.  Osseous demineralization with scattered degenerative changes and soft tissue swelling.  No definite acute  osseous findings.  If patient has persistent clinical suspicion of osteomyelitis or deep soft tissue infection, recommend MR.   Electronically Signed   By: Lavonia Dana M.D.   On: 06/18/2014 16:57   Medications: I have reviewed the patient's current medications. Scheduled Meds: . amLODipine  5 mg Oral Daily  . heparin  5,000 Units Subcutaneous 3 times per day  . insulin aspart  0-9 Units Subcutaneous TID WC  . insulin glargine  10 Units Subcutaneous QHS  . piperacillin-tazobactam (ZOSYN)  IV  2.25 g Intravenous 3 times per day  . vancomycin  1,000 mg Intravenous Once  . [START ON 06/20/2014] vancomycin  750 mg Intravenous Q24H   Continuous Infusions: . sodium chloride 75 mL/hr at  06/19/14 0229   PRN Meds:. Assessment/Plan: Principal Problem:   Diabetic foot infection Active Problems:   DM2 (diabetes mellitus, type 2)   Diabetic foot ulcer   Acute-on-chronic kidney injury   Anemia   UTI (urinary tract infection)   Essential hypertension  #Infected diabetic foot ulcer Ruth Gift has diabetic foot ulcer with MRI demonstrating osteomyelitis of the 3rd metatarsal head w sinus tract extending to ulcer plantar to the 3rd metatarsal head as well as abscess between the 3rd and 4th metatarsal heads. Her lactic acidosis has resolved and she remains afebrile with no leukocytosis. Initially received vanc/zosyn in ED but was put on ceftriaxone/flagyl for diabetic foot ulcer. Will broaden antibiotics since MRI shows osteomyelitis. Dr Sharol Given was called and will see patient -f/u ortho consult, wound care consult -vancomycin and zosyn per pharmacy (they will dose for renal impairment) -Normal saline at 75 mL per hour. -Tylenol 650 mg every 6 hours as needed for pain. -Follow-up blood culture x 2 -Carb-modified diet, nothing by mouth at midnight for possible procedure. -PT and OT eval and treat.  #Urinary tract infection Started treatment one day prior to presentation and is now on broad spectrum  antibiotics. She says dysuria is improving but still present but no systemic complaints. As above, no fever, leukocytosis, or CVA tenderness on exam.  -Antibiotics as above. -Follow-up urine culture.  #Acute on chronic kidney disease Creatinine elevated to 2.62 on presentation, up from 1.08 in 2014. This morning down to 2.38 following gentle hydration NS 75 cc/hr. This may represent progressive CKD or possible prerenal kidney injury from infection -NS @ 75 cc/hr -FeNa labs -renal ultrasound -cont to monitor  #Type 2 diabetes Glucose elevated to 307 on presentation, likely poor long-term control of diabetes. Hemoglobin A1c was only 7.3 in 2013. Reports not taking in metformin currently. AM CBG 292. -Repeat hemoglobin A1c. -Continue home Lantus 10 units daily at bedtime. -CBGs before meals and at bedtime with sliding scale insulin. -Consult diabetes coordinator.  #Anemia Hemoglobin elevated 10.7 on presentation,this morning 9.7, from previous baseline in 2014 of approximately 8. No previous anemia panel in our system. She was previously on ferrous sulfate. -f/u anemia panel. -Trend hemoglobin.  #Hyperlipidemia Reports not taking atorvastatin 40 mg daily. -Consider restarting during hospitalization.  #Hypertension BP 110s-160s/60s past day. -Resumed amlodipine 5 mg daily in the morning.  #DVT prophylaxis -Heparin.  Dispo: Disposition is deferred at this time, awaiting improvement of current medical problems.  Anticipated discharge in approximately 2-3 day(s).   The patient does not know have a current PCP (No Pcp Per Patient) and does need an [facility] hospital follow-up appointment after discharge.  The patient does not know have transportation limitations that hinder transportation to clinic appointments.  .Services Needed at time of discharge: Y = Yes, Blank = No PT:   OT:   RN:   Equipment:   Other:     LOS: 1 day   Ruth Aline, MD 06/19/2014, 11:05 AM

## 2014-06-19 NOTE — Care Management Note (Signed)
    Page 1 of 2   06/23/2014     11:14:11 AM CARE MANAGEMENT NOTE 06/23/2014  Patient:  Ruth Douglas, Ruth Douglas   Account Number:  [id number]  Date Initiated:  06/19/2014  Documentation initiated by:  Carles Collet  Subjective/Objective Assessment:   pt with osteomylitis, ortho consult to eval. from home with husband. independant.     Action/Plan:   will follow for discharge needs. Pt states she has walker, crutches, and cane at home.   Anticipated DC Date:  06/22/2014   Anticipated DC Plan:  [place]  CM consult  [facility]      [facility] Choice  HOME HEALTH   Choice offered to / List presented to:  C-1 Patient        [place] arranged  [place].   Status of service:  Completed, signed off Medicare Important Message given?  YES (If response is "NO", the following Medicare IM given date fields will be blank) Date Medicare IM given:  06/18/2014 Medicare IM given by:  Carles Collet Date Additional Medicare IM given:  06/23/2014 Additional Medicare IM given by:  Carles Collet  Discharge Disposition:  [place]  Per UR Regulation:  Reviewed for med. necessity/level of care/duration of stay  If discussed at [place] of Stay Meetings, dates discussed:    Comments:  06-23-14 IM letter given, pt stated that she only wanted one visit from [facility] at first may not want further treatment, set up with [facility] per patient request. Pt stated she had walker WC and cane, and did not want any further DME or [place] assistance. Carles Collet RN BSN CM  06-20-14. IM letter given, will follow for discharge needs. Carles Collet RN BSN CM  06-18-14 Will follow for discharge needs. pt given information about [facility], IM given.  Carles Collet RN BSN CM

## 2014-06-19 NOTE — Progress Notes (Signed)
ANTIBIOTIC CONSULT NOTE - INITIAL  Pharmacy Consult for vancomycin and Zosyn Indication: diabetic foot ulcer with osteo  No Known Allergies  Patient Measurements: Height: 5\' 3"  (160 cm) Weight: 148 lb 9.4 oz (67.4 kg) IBW/kg (Calculated) : 52.4  Vital Signs: Temp: 98.5 F (36.9 C) (04/21 0504) Temp Source: Oral (04/21 0504) BP: 110/69 mmHg (04/21 0504) Pulse Rate: 79 (04/21 0504) Intake/Output from previous day:   Intake/Output from this shift: Total I/O In: 200 [P.O.:200] Out: -   Labs:  Recent Labs  06/18/14 1615 06/18/14 1922 06/19/14 0820  WBC 6.4  --  4.8  HGB 10.7*  --  9.7*  PLT 345  --  315  CREATININE  --  2.62* 2.38*   Estimated Creatinine Clearance: 20 mL/min (by C-G formula based on Cr of 2.38). No results for input(s): VANCOTROUGH, VANCOPEAK, VANCORANDOM, GENTTROUGH, GENTPEAK, GENTRANDOM, TOBRATROUGH, TOBRAPEAK, TOBRARND, AMIKACINPEAK, AMIKACINTROU, AMIKACIN in the last 72 hours.   Microbiology: Recent Results (from the past 720 hour(s))  Blood culture (routine x 2)     Status: None (Preliminary result)   Collection Time: 06/18/14  4:15 PM  Result Value Ref Range Status   Specimen Description BLOOD ARM LEFT  Final   Special Requests BOTTLES DRAWN AEROBIC AND ANAEROBIC 5CC  Final   Culture   Final           BLOOD CULTURE RECEIVED NO GROWTH TO DATE CULTURE WILL BE HELD FOR 5 DAYS BEFORE ISSUING A FINAL NEGATIVE REPORT Performed at [facility]    Report Status PENDING  Incomplete  Blood culture (routine x 2)     Status: None (Preliminary result)   Collection Time: 06/18/14  4:24 PM  Result Value Ref Range Status   Specimen Description BLOOD ARM RIGHT  Final   Special Requests BOTTLES DRAWN AEROBIC AND ANAEROBIC 5CC  Final   Culture   Final           BLOOD CULTURE RECEIVED NO GROWTH TO DATE CULTURE WILL BE HELD FOR 5 DAYS BEFORE ISSUING A FINAL NEGATIVE REPORT Performed at [facility]    Report Status PENDING  Incomplete    Assessment: 30 YOF s/p bilateral toe amputations who presents with a foot ulcer. XRay showed osseous demineralization, but no definitive osteomyelitis , however MRI done this morning shows osteomyelitis of 3rd metatarsal. Was on ceftriaxone and Flagyl for DM wound, now to transition to Zosyn and vancomycin.  SCr down from admission, but still up at 2.38 (baseline ~1 in 2014), est CrCl ~15-46mL/min.  WBC nml, patient is afebrile.  Urine culture has been sent. Blood cultures NGTD.  Goal of Therapy:  Vancomycin trough level 15-20 mcg/ml  Plan:  -Zosyn 2.25g IV q8h -vancomycin 1g IV x1 as load, then 750mg  IV q24h -follow c/s, surgical plans, renal function, trough at [place] D. Ahana Najera, PharmD, BCPS Clinical Pharmacist Pager: [phone] 06/19/2014 10:49 AM

## 2014-06-19 NOTE — Evaluation (Signed)
Occupational Therapy Evaluation and Discharge Patient Details Name: Ruth Douglas MRN: [id number] DOB: 03/21/1940 Today's Date: 06/19/2014    History of Present Illness Pt is a 74 y.o. 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 Pt was given Bactrim for UTI due to burning urination without relief. Pt also with ulcer on R foot with discharge.    Clinical Impression   PTA pt lived at home and was independent with ADLs. Pt currently at Mod I/Supervision level and will have assistance at home. No further acute OT needs.     Follow Up Recommendations  No OT follow up;Supervision - Intermittent    Equipment Recommendations  None recommended by OT    Recommendations for Other Services       Precautions / Restrictions Precautions Precautions: Fall Precaution Comments: Decreased sensation in bilateral feet - R>L. Ulcer is on R side.  Restrictions Weight Bearing Restrictions: Yes RLE Weight Bearing: Weight bearing as tolerated (per RN on 4/21 no WB status orders noted)      Mobility Bed Mobility Overal bed mobility: Modified Independent                Transfers Overall transfer level: Modified independent Equipment used: None                       ADL Overall ADL's : Needs assistance/impaired                                       General ADL Comments: Pt at Mod I/supervision level for ADLs and functional mobility while wearing Bil shoes. Pt able to don socks and shoes and ambulate to door and back safely. Pt reports she has assistance at home and no OT needs.      Vision Additional Comments: No change from baseline          Pertinent Vitals/Pain Pain Assessment: No/denies pain     Hand Dominance Right   Extremity/Trunk Assessment Upper Extremity Assessment Upper Extremity Assessment: Overall WFL for tasks assessed    Lower Extremity Assessment Lower Extremity Assessment: Defer to PT evaluation RLE Deficits / Details: ulcer on plantar surface of foot   Cervical / Trunk Assessment Cervical / Trunk Assessment: Normal   Communication Communication Communication: No difficulties   Cognition Arousal/Alertness: Awake/alert Behavior During Therapy: WFL for tasks assessed/performed Overall Cognitive Status: Within Functional Limits for tasks assessed                                Home Living Family/patient expects to be discharged to:: Private residence Living Arrangements: Spouse/significant other (children live across the street) Available Help at Discharge: Family;Available 24 hours/day Type of Home: House Home Access: Stairs to enter [employer] of Steps: 2 Entrance Stairs-Rails: Right Home Layout: One level     Bathroom Shower/Tub: Occupational psychologist: Standard Bathroom Accessibility: Yes How Accessible: Accessible via wheelchair;Accessible via walker Home Equipment: Baggs - 2 wheels;Cane - single point;Wheelchair - manual          Prior Functioning/Environment Level of Independence: Independent        Comments: No longer drives. Community ambulator    OT Diagnosis: Generalized weakness;Acute pain    End of Session  Activity  Tolerance: Patient tolerated treatment well Patient left: in bed;with call bell/phone within reach;with family/visitor present   Time: 1535-1558 OT Time Calculation (min): 23 min Charges:  OT General Charges $OT Visit: 1 Procedure OT Evaluation $Initial OT Evaluation Tier I: 1 Procedure OT Treatments $Self Care/Home Management : 8-22 mins G-Codes:    Juluis Rainier Jul 09, 2014, 5:00 PM  Cyndie Chime, OTR/L Occupational Therapist [phone] (pager)

## 2014-06-19 NOTE — Progress Notes (Deleted)
Pt discharging via wheelchair, escorted by volunteer, meeting his brother downstairs to go home. Discharge instructions provided to patient, verbalize understanding and able to provide appropriate teach back. PIV discontinued, site without s/s of complication. Tele box 14 removed and returned to unit. Denies needs or questions at this time.

## 2014-06-19 NOTE — Consult Note (Addendum)
WOC consult requested for foot wound; Pt has osteomyelitis according to the MRI results.  This complex medical condition is beyond [facility] scope of practice.  Please consult ortho service for further plan of care.   Thank-you, [place], [place]

## 2014-06-19 NOTE — Consult Note (Signed)
Reason for Consult: Abscess osteomyelitis right forefoot Referring Physician: Dr. Murphy  Ruth Douglas is an 74 y.o. female.  HPI: Patient is a 74-year-old woman status post transmetatarsal amputation on the left and status post first and second ray amputations on the right who presents with abscess ulceration beneath the third metatarsal head with cellulitis of the forefoot. Patient has undergone transmetatarsal amputation the left by Dr. Hewitt. I discussed her case with Dr. Hewitt and he would like me to assume care for the right foot.  Past Medical History  Diagnosis Date  . Renal disorder   . Blood transfusion without reported diagnosis   . Diabetes mellitus without complication   . Hypertension   . CHF (congestive heart failure)     Past Surgical History  Procedure Laterality Date  . Toe amputation    . Cesarean section    . Cystoscopy with stent placement  01/16/2012    Procedure: CYSTOSCOPY WITH STENT PLACEMENT;  Surgeon: Les Borden, MD;  Location: [facility];  Service: Urology;  Laterality: Left;  cysto, retrograde , ureteroscopy , stone extraction with laser lithotripsy, stent placement on left, bladder biopsies with fulgaration  . Holmium laser application  01/16/2012    Procedure: HOLMIUM LASER APPLICATION;  Surgeon: Les Borden, MD;  Location: [facility];  Service: Urology;  Laterality: Left;  . Amputation Left 06/01/2012    Procedure: LEFT FOOT TRANSMETATARSEL AMPUTATION;  Surgeon: John Hewitt, MD;  Location: [facility] OR;  Service: Orthopedics;  Laterality: Left;  . Achilles tendon surgery Left 06/01/2012    Procedure: PERCUTANEOUS TENDON ACHILLES LENGTHING;  Surgeon: John Hewitt, MD;  Location: [facility] OR;  Service: Orthopedics;  Laterality: Left;    Family History  Problem Relation Age of Onset  . Diabetes Mellitus II Mother   . Diabetes Mellitus II Father   . Lung cancer Sister   . Kidney cancer Brother     Social History:  reports that she has never smoked. She does not have any  smokeless tobacco history on file. She reports that she does not drink alcohol or use illicit drugs.  Allergies: No Known Allergies  Medications: I have reviewed the patient's current medications.  Results for orders placed or performed during the hospital encounter of 06/18/14 (from the past 48 hour(s))  CBC with Differential/Platelet     Status: Abnormal   Collection Time: 06/18/14  4:15 PM  Result Value Ref Range   WBC 6.4 4.0 - 10.5 K/uL   RBC 3.98 3.87 - 5.11 MIL/uL   Hemoglobin 10.7 (L) 12.0 - 15.0 g/dL   HCT 32.5 (L) 36.0 - 46.0 %   MCV 81.7 78.0 - 100.0 fL   MCH 26.9 26.0 - 34.0 pg   MCHC 32.9 30.0 - 36.0 g/dL   RDW 13.1 11.5 - 15.5 %   Platelets 345 150 - 400 K/uL   Neutrophils Relative % 74 43 - 77 %   Neutro Abs 4.8 1.7 - 7.7 K/uL   Lymphocytes Relative 19 12 - 46 %   Lymphs Abs 1.2 0.7 - 4.0 K/uL   Monocytes Relative 7 3 - 12 %   Monocytes Absolute 0.4 0.1 - 1.0 K/uL   Eosinophils Relative 0 0 - 5 %   Eosinophils Absolute 0.0 0.0 - 0.7 K/uL   Basophils Relative 0 0 - 1 %   Basophils Absolute 0.0 0.0 - 0.1 K/uL  Blood culture (routine x 2)     Status: None (Preliminary result)   Collection Time: 06/18/14  4:15 PM    Result Value Ref Range   Specimen Description BLOOD ARM LEFT    Special Requests BOTTLES DRAWN AEROBIC AND ANAEROBIC 5CC    Culture             BLOOD CULTURE RECEIVED NO GROWTH TO DATE CULTURE WILL BE HELD FOR 5 DAYS BEFORE ISSUING A FINAL NEGATIVE REPORT Performed at [facility]    Report Status PENDING   Sedimentation rate     Status: Abnormal   Collection Time: 06/18/14  4:15 PM  Result Value Ref Range   Sed Rate 119 (H) 0 - 22 mm/hr  Urinalysis, Routine w reflex microscopic     Status: Abnormal   Collection Time: 06/18/14  4:20 PM  Result Value Ref Range   Color, Urine YELLOW YELLOW   APPearance TURBID (A) CLEAR   Specific Gravity, Urine 1.017 1.005 - 1.030   pH 6.0 5.0 - 8.0   Glucose, UA 100 (A) NEGATIVE mg/dL   Hgb urine dipstick  LARGE (A) NEGATIVE   Bilirubin Urine NEGATIVE NEGATIVE   Ketones, ur NEGATIVE NEGATIVE mg/dL   Protein, ur 100 (A) NEGATIVE mg/dL   Urobilinogen, UA 0.2 0.0 - 1.0 mg/dL   Nitrite NEGATIVE NEGATIVE   Leukocytes, UA LARGE (A) NEGATIVE  Urine microscopic-add on     Status: Abnormal   Collection Time: 06/18/14  4:20 PM  Result Value Ref Range   Squamous Epithelial / LPF FEW (A) RARE   WBC, UA TOO NUMEROUS TO COUNT <3 WBC/hpf   RBC / HPF 0-2 <3 RBC/hpf   Bacteria, UA FEW (A) RARE  Blood culture (routine x 2)     Status: None (Preliminary result)   Collection Time: 06/18/14  4:24 PM  Result Value Ref Range   Specimen Description BLOOD ARM RIGHT    Special Requests BOTTLES DRAWN AEROBIC AND ANAEROBIC 5CC    Culture             BLOOD CULTURE RECEIVED NO GROWTH TO DATE CULTURE WILL BE HELD FOR 5 DAYS BEFORE ISSUING A FINAL NEGATIVE REPORT Performed at [facility]    Report Status PENDING   I-Stat CG4 Lactic Acid, ED     Status: Abnormal   Collection Time: 06/18/14  4:46 PM  Result Value Ref Range   Lactic Acid, Venous 2.04 (HH) 0.5 - 2.0 mmol/L   Comment NOTIFIED PHYSICIAN   C-reactive protein     Status: Abnormal   Collection Time: 06/18/14  7:22 PM  Result Value Ref Range   CRP 6.5 (H) <0.60 mg/dL    Comment: Performed at [facility] metabolic panel     Status: Abnormal   Collection Time: 06/18/14  7:22 PM  Result Value Ref Range   Sodium 131 (L) 135 - 145 mmol/L   Potassium 4.3 3.5 - 5.1 mmol/L   Chloride 95 (L) 96 - 112 mmol/L   CO2 25 19 - 32 mmol/L   Glucose, Bld 307 (H) 70 - 99 mg/dL   BUN 32 (H) 6 - 23 mg/dL   Creatinine, Ser 2.62 (H) 0.50 - 1.10 mg/dL   Calcium 9.0 8.4 - 10.5 mg/dL   GFR calc non Af Amer 17 (L) >90 mL/min   GFR calc Af Amer 20 (L) >90 mL/min    Comment: (NOTE) The eGFR has been calculated using the CKD EPI equation. This calculation has not been validated in all clinical situations. eGFR's persistently <90 mL/min signify  possible Chronic Kidney Disease.    Anion gap 11  5 - 15  I-Stat CG4 Lactic Acid, ED     Status: None   Collection Time: 06/18/14  7:31 PM  Result Value Ref Range   Lactic Acid, Venous 1.12 0.5 - 2.0 mmol/L  I-Stat CG4 Lactic Acid, ED     Status: None   Collection Time: 06/18/14  7:56 PM  Result Value Ref Range   Lactic Acid, Venous 1.38 0.5 - 2.0 mmol/L  Glucose, capillary     Status: Abnormal   Collection Time: 06/19/14 12:29 AM  Result Value Ref Range   Glucose-Capillary 213 (H) 70 - 99 mg/dL   Comment 1 Notify RN   Glucose, capillary     Status: Abnormal   Collection Time: 06/19/14  7:45 AM  Result Value Ref Range   Glucose-Capillary 292 (H) 70 - 99 mg/dL  CBC with Differential     Status: Abnormal   Collection Time: 06/19/14  8:20 AM  Result Value Ref Range   WBC 4.8 4.0 - 10.5 K/uL   RBC 3.52 (L) 3.87 - 5.11 MIL/uL   Hemoglobin 9.7 (L) 12.0 - 15.0 g/dL   HCT 29.1 (L) 36.0 - 46.0 %   MCV 82.7 78.0 - 100.0 fL   MCH 27.6 26.0 - 34.0 pg   MCHC 33.3 30.0 - 36.0 g/dL   RDW 13.4 11.5 - 15.5 %   Platelets 315 150 - 400 K/uL   Neutrophils Relative % 54 43 - 77 %   Neutro Abs 2.6 1.7 - 7.7 K/uL   Lymphocytes Relative 34 12 - 46 %   Lymphs Abs 1.6 0.7 - 4.0 K/uL   Monocytes Relative 9 3 - 12 %   Monocytes Absolute 0.5 0.1 - 1.0 K/uL   Eosinophils Relative 3 0 - 5 %   Eosinophils Absolute 0.2 0.0 - 0.7 K/uL   Basophils Relative 0 0 - 1 %   Basophils Absolute 0.0 0.0 - 0.1 K/uL  Comprehensive metabolic panel     Status: Abnormal   Collection Time: 06/19/14  8:20 AM  Result Value Ref Range   Sodium 130 (L) 135 - 145 mmol/L   Potassium 4.0 3.5 - 5.1 mmol/L   Chloride 96 96 - 112 mmol/L   CO2 24 19 - 32 mmol/L   Glucose, Bld 301 (H) 70 - 99 mg/dL   BUN 30 (H) 6 - 23 mg/dL   Creatinine, Ser 2.38 (H) 0.50 - 1.10 mg/dL   Calcium 8.5 8.4 - 10.5 mg/dL   Total Protein 6.9 6.0 - 8.3 g/dL   Albumin 3.0 (L) 3.5 - 5.2 g/dL   AST 14 0 - 37 U/L   ALT 12 0 - 35 U/L   Alkaline  Phosphatase 66 39 - 117 U/L   Total Bilirubin 0.5 0.3 - 1.2 mg/dL   GFR calc non Af Amer 19 (L) >90 mL/min   GFR calc Af Amer 22 (L) >90 mL/min    Comment: (NOTE) The eGFR has been calculated using the CKD EPI equation. This calculation has not been validated in all clinical situations. eGFR's persistently <90 mL/min signify possible Chronic Kidney Disease.    Anion gap 10 5 - 15  Vitamin B12     Status: None   Collection Time: 06/19/14  8:20 AM  Result Value Ref Range   Vitamin B-12 628 211 - 911 pg/mL    Comment: Performed at [facility]  Folate     Status: None   Collection Time: 06/19/14  8:20 AM  Result Value Ref   Range   Folate >20.0 ng/mL    Comment: (NOTE) Reference Ranges        Deficient:       0.4 - 3.3 ng/mL        Indeterminate:   3.4 - 5.4 ng/mL        Normal:              > 5.4 ng/mL Performed at [facility]   Iron and TIBC     Status: Abnormal   Collection Time: 06/19/14  8:20 AM  Result Value Ref Range   Iron 35 (L) 42 - 145 ug/dL   TIBC 249 (L) 250 - 470 ug/dL   Saturation Ratios 14 (L) 20 - 55 %   UIBC 214 125 - 400 ug/dL    Comment: Performed at [facility]  Ferritin     Status: None   Collection Time: 06/19/14  8:20 AM  Result Value Ref Range   Ferritin 113 10 - 291 ng/mL    Comment: Performed at [facility]  Reticulocytes     Status: Abnormal   Collection Time: 06/19/14  8:20 AM  Result Value Ref Range   Retic Ct Pct 0.9 0.4 - 3.1 %   RBC. 3.56 (L) 3.87 - 5.11 MIL/uL   Retic Count, Manual 32.0 19.0 - 186.0 K/uL  Sodium, urine, random     Status: None   Collection Time: 06/19/14 10:10 AM  Result Value Ref Range   Sodium, Ur 65 mmol/L  Creatinine, urine, random     Status: None   Collection Time: 06/19/14 10:10 AM  Result Value Ref Range   Creatinine, Urine 95.13 mg/dL  Glucose, capillary     Status: Abnormal   Collection Time: 06/19/14 11:51 AM  Result Value Ref Range   Glucose-Capillary 251 (H) 70 - 99  mg/dL  Glucose, capillary     Status: Abnormal   Collection Time: 06/19/14  6:10 PM  Result Value Ref Range   Glucose-Capillary 386 (H) 70 - 99 mg/dL    US Renal  06/19/2014   CLINICAL DATA:  Acute renal insufficiency  EXAM: RENAL/URINARY TRACT ULTRASOUND COMPLETE  COMPARISON:  CT scan 11/20/2012  FINDINGS: Right Kidney:  Length: 10.3 cm. Relatively normal renal cortical thickness. Lobulated borders could be due to feel lobulation or prior scarring. Normal renal cortical echogenicity. Small upper pole cysts are noted. No hydronephrosis.  Left Kidney:  Length: 10.4 cm. Normal echogenicity and mild renal cortical thinning. No hydronephrosis. A lower pole renal calculus is noted.  Bladder:  Grossly normal but poorly distended.  IMPRESSION: Upper pole right renal cysts and lower pole left renal calculus.  No hydronephrosis.   Electronically Signed   By: Marijo Sanes M.D.   On: 06/19/2014 18:38   Mr Foot Right Wo Contrast  06/19/2014   CLINICAL DATA:  Uncontrolled type 2 diabetes. Nephropathy and neuropathy. Neuropathy. Foot amputation. Osteomyelitis of the foot.  EXAM: MRI OF THE RIGHT FOREFOOT WITHOUT CONTRAST  TECHNIQUE: Multiplanar, multisequence MR imaging was performed. No intravenous contrast was administered.  COMPARISON:  Radiographs 06/18/2014.  FINDINGS: Osteomyelitis of the distal third metatarsal is present. There is an ulcer tracking from the plantar aspect of the third metatarsal head. The phalanges of the third toe or dislocated, better demonstrated on prior plain films 06/18/2014. The ulcer tunnel is to the third metatarsal head and there is a small abscess along the dorsal third metatarsal head measuring 16 mm x 12 mm by 23 mm plantar  to dorsal. The abscess is interposed between the third and fourth metatarsal heads.  Bone marrow edema radiates through the third metatarsal shaft to the base of the metatarsal. Neuropathic midfoot. There is a fourth MTP joint effusion which may be reactive or  represent septic arthritis from the adjacent abscess. The intermetatarsal component of the abscess is best viewed on axial imaging (image 25 series 5). First and second toe amputations noted.  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 Abscess between the third and fourth metatarsal heads. Fourth MTP joint effusion which may be reactive or represent septic arthritis.   Electronically Signed   By: Dereck Ligas M.D.   On: 06/19/2014 08:23   Dg Foot Complete Right  06/18/2014   CLINICAL DATA:  Swelling RIGHT toes for 1 week, open wound at bottom of RIGHT foot at MTP joint region, history diabetes  EXAM: RIGHT FOOT COMPLETE - 3+ VIEW  COMPARISON:  01/15/2012  FINDINGS: Prior amputations of great toe and of second toe at the mid metatarsal level.  Dislocation at third MTP joint.  Diffuse osseous demineralization and scattered soft tissue swelling.  Scattered intertarsal degenerative changes.  Mild sclerosis of the distal second metatarsal similar to previous exam question old healed fracture or stress response.  No definite acute fracture, dislocation or bone destruction.  IMPRESSION: Amputations of the first and second toes.  Osseous demineralization with scattered degenerative changes and soft tissue swelling.  No definite acute osseous findings.  If patient has persistent clinical suspicion of osteomyelitis or deep soft tissue infection, recommend MR.   Electronically Signed   By: Lavonia Dana M.D.   On: 06/18/2014 16:57    Review of Systems  All other systems reviewed and are negative.  Blood pressure 140/54, pulse 83, temperature 98.2 F (36.8 C), temperature source Oral, resp. rate 18, height 5' 3" (1.6 m), weight 67.4 kg (148 lb 9.4 oz), SpO2 97 %. Physical Exam On examination patient is alert and oriented no adenopathy she has a stable transmetatarsal amputation on the left. The right foot she has ulcer  osteomyelitis beneath the third metatarsal head right foot with purulent drainage exposed bone she is status post a first and second Ray amputation. She has a good dorsalis pedis pulse. Assessment/Plan: Assessment: Osteomyelitis ulceration abscess right foot third metatarsal head status post first and second Ray amputation with deformity to the right foot.  Plan: I will plan for transmetatarsal amputation on the right risk and benefits were discussed patient states she understands wishes to proceed at this time plan for surgery tomorrow.  Natasha Paulson V 06/19/2014, 7:32 PM

## 2014-06-19 NOTE — Progress Notes (Signed)
[name] [id number] Admitted to 5W10: 06/19/2014 12:27 AM Attending Provider: Madilyn Fireman, MD    Ruth Douglas is a 74 y.o. female patient admitted from ED awake, alert  & orientated  X 3,  Full Code, VSS - Blood pressure 161/62, pulse 80, temperature 98.5 F (36.9 C), temperature source Oral, resp. rate 18, height 5\' 3"  (1.6 m), weight 65 kg (143 lb 4.8 oz), SpO2 100 %.RA, no c/o shortness of breath, no c/o chest pain, no distress noted.    IV site WDL:  with a transparent dsg that's clean dry and intact.  Allergies:  No Known Allergies   Past Medical History  Diagnosis Date  . Renal disorder   . Blood transfusion without reported diagnosis   . Diabetes mellitus without complication   . Hypertension   . CHF (congestive heart failure)     History:  obtained from patient  Pt orientation to unit, room and routine. Information packet given to patient and safety video watched.  Admission INP armband ID verified with patient/family, and in place. SR up x 2, fall risk assessment complete with Patient and family verbalizing understanding of risks associated with falls. Pt verbalizes an understanding of how to use the call bell and to call for help before getting out of bed.  Skin, clean- without evidence of bruising, or skin tears.  She has a diabetic foot ulcer on bottom of foot 1x1cm oozing small amount of serous drainage.   Will cont to monitor and assist as needed.  Parthenia Ames, RN 06/19/2014 12:27 AM

## 2014-06-19 NOTE — Progress Notes (Addendum)
INITIAL NUTRITION ASSESSMENT  DOCUMENTATION CODES Per approved criteria  -Not Applicable   INTERVENTION: -Glucerna Shake po BID, each supplement provides 220 kcal and 10 grams of protein -MVI daily  NUTRITION DIAGNOSIS: Increased nutrient needs related to wound healing as evidenced by estimated needs.   Goal: Pt will meet >90% of estimated nutritional needs  Monitor:  PO/supplement intake, las, weight changes, I/O's  Reason for Assessment: Consult for wound healing  74 y.o. 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 She states that over the last 3 days she's been having burning with urination and persistent discomfort and a feeling of incomplete emptying of her bladder. She was seen at [facility] yesterday, and she was given Bactrim for urinary tract infection. She has been taking this over the last 24 hours, but she has not had relief of her symptoms. She also reports developing a callus on the bottom of her right foot several months ago. She reports picking at it 3 days ago and noticing a purulent discharge. Over the past few days, she is also noticing increasing erythema surrounding the ulcer.   ASSESSMENT: Pt admitted with DM foot ulcer. She reports hx of poorly controlled diabetes, particularly since she began having foot issues several years ago. She reports she initially had a callous on her left foot and had to have her toes amputated; she has now developed a similiar presentation on the right foot per her report. Home blood glucose levels run between 100-200, but pt admits to not checking her blood sugars as often as she should.  Per chart review, wound is consistent with osteomyelitis.  Pt reports decreased appetite 7 days PTA, but she reveals  her appetite is returning. She ate all of her breakfast besides the fruit cup. She denies weight loss and reveals UBW is 150#, which is consistent with wt hx.  Nutrition-focused physical exam revealed no signs of fat and muscle depletion. Discussed importance of good PO intake and optimizing glycemic control to assist with wound healing. Pt reports order a peanut butter sandwich for lunch; discussed other ways to maximize protein intake. Pt amenable to Glucerna.  Labs reviewed. Na: 130, BUN/Creat: 30/2.38, Glucose: 301. CBGS: 213-292.   Height: Ht Readings from Last 1 Encounters:  06/19/14 5\' 3"  (1.6 m)    Weight: Wt Readings from Last 1 Encounters:  06/19/14 148 lb 9.4 oz (67.4 kg)    Ideal Body Weight: 115#  % Ideal Body Weight: 129%  Wt Readings from Last 10 Encounters:  06/19/14 148 lb 9.4 oz (67.4 kg)  08/02/12 152 lb (68.947 kg)  01/21/12 155 lb (70.308 kg)    Usual Body Weight: 150#  % Usual Body Weight: 99%  BMI:  Body mass index is 26.33 kg/(m^2). Overweight  Estimated Nutritional Needs: Kcal: 1800-2000 Protein: 80-90 grams Fluid: 1.8-2.0 L  Skin: DM foot ulcer on rt foot  Diet Order: Diet Carb Modified Fluid consistency:: Thin; Room service appropriate?: Yes  EDUCATION NEEDS: -Education needs addressed   Intake/Output Summary (Last 24 hours) at 06/19/14 1238 Last data filed at 06/19/14 1220  Gross per 24 hour  Intake    200 ml  Output     50 ml  Net    150 ml    Last BM: 06/17/14  Labs:   Recent Labs Lab 06/18/14  1922 06/19/14 0820  NA 131* 130*  K 4.3 4.0  CL 95* 96  CO2 25 24  BUN 32* 30*  CREATININE 2.62* 2.38*  CALCIUM 9.0 8.5  GLUCOSE 307* 301*    CBG (last 3)   Recent Labs  06/19/14 0029 06/19/14 0745 06/19/14 1151  GLUCAP 213* 292* 251*    Scheduled Meds: . amLODipine  5 mg Oral Daily  . heparin  5,000 Units Subcutaneous 3 times per day  . insulin aspart  0-9 Units Subcutaneous TID WC  . insulin glargine  17 Units  Subcutaneous QHS  . piperacillin-tazobactam (ZOSYN)  IV  2.25 g Intravenous 3 times per day  . vancomycin  1,000 mg Intravenous Once  . [START ON 06/20/2014] vancomycin  750 mg Intravenous Q24H    Continuous Infusions: . sodium chloride 75 mL/hr at 06/19/14 0229    Past Medical History  Diagnosis Date  . Renal disorder   . Blood transfusion without reported diagnosis   . Diabetes mellitus without complication   . Hypertension   . CHF (congestive heart failure)     Past Surgical History  Procedure Laterality Date  . Toe amputation    . Cesarean section    . Cystoscopy with stent placement  01/16/2012    Procedure: CYSTOSCOPY WITH STENT PLACEMENT;  Surgeon: Dutch Gray, MD;  Location: [facility];  Service: Urology;  Laterality: Left;  cysto, retrograde , ureteroscopy , stone extraction with laser lithotripsy, stent placement on left, bladder biopsies with fulgaration  . Holmium laser application  75/30/0511    Procedure: HOLMIUM LASER APPLICATION;  Surgeon: Dutch Gray, MD;  Location: [facility];  Service: Urology;  Laterality: Left;  . Amputation Left 06/01/2012    Procedure: LEFT FOOT TRANSMETATARSEL AMPUTATION;  Surgeon: Wylene Simmer, MD;  Location: [place];  Service: Orthopedics;  Laterality: Left;  . Achilles tendon surgery Left 06/01/2012    Procedure: PERCUTANEOUS TENDON ACHILLES LENGTHING;  Surgeon: Wylene Simmer, MD;  Location: [place];  Service: Orthopedics;  Laterality: Left;    Landra Howze A. Jimmye Norman, RD, LDN, CDE Pager: [phone] After hours Pager: [phone]

## 2014-06-19 NOTE — Progress Notes (Signed)
Utilization review completed.  

## 2014-06-20 ENCOUNTER — Inpatient Hospital Stay (HOSPITAL_COMMUNITY): Payer: Medicare FFS | Admitting: Anesthesiology

## 2014-06-20 ENCOUNTER — Encounter (HOSPITAL_COMMUNITY)
Admission: EM | Disposition: A | Discharge status: Home Health | Payer: Self-pay | Source: Home / Self Care | Attending: Internal Medicine

## 2014-06-20 DIAGNOSIS — B9561 Methicillin susceptible Staphylococcus aureus infection as the cause of diseases classified elsewhere: Secondary | ICD-10-CM

## 2014-06-20 DIAGNOSIS — N814 Uterovaginal prolapse, unspecified: Secondary | ICD-10-CM

## 2014-06-20 HISTORY — PX: AMPUTATION: SHX166

## 2014-06-20 LAB — URINE CULTURE

## 2014-06-20 LAB — BASIC METABOLIC PANEL
ANION GAP: 9 (ref 5–15)
BUN: 26 mg/dL — AB (ref 6–23)
CALCIUM: 8.8 mg/dL (ref 8.4–10.5)
CO2: 26 mmol/L (ref 19–32)
CREATININE: 1.99 mg/dL — AB (ref 0.50–1.10)
Chloride: 99 mmol/L (ref 96–112)
GFR, EST AFRICAN AMERICAN: 28 mL/min — AB (ref >90)
GFR, EST NON AFRICAN AMERICAN: 24 mL/min — AB (ref >90)
Glucose, Bld: 234 mg/dL — ABNORMAL HIGH (ref 70–99)
Potassium: 4.6 mmol/L (ref 3.5–5.1)
Sodium: 134 mmol/L — ABNORMAL LOW (ref 135–145)

## 2014-06-20 LAB — CBC
HCT: 30.4 % — ABNORMAL LOW (ref 36.0–46.0)
HEMOGLOBIN: 9.9 g/dL — AB (ref 12.0–15.0)
MCH: 26.9 pg (ref 26.0–34.0)
MCHC: 32.6 g/dL (ref 30.0–36.0)
MCV: 82.6 fL (ref 78.0–100.0)
Platelets: 301 10*3/uL (ref 150–400)
RBC: 3.68 MIL/uL — ABNORMAL LOW (ref 3.87–5.11)
RDW: 13.1 % (ref 11.5–15.5)
WBC: 4.1 10*3/uL (ref 4.0–10.5)

## 2014-06-20 LAB — GLUCOSE, CAPILLARY
GLUCOSE-CAPILLARY: 234 mg/dL — AB (ref 70–99)
Glucose-Capillary: 175 mg/dL — ABNORMAL HIGH (ref 70–99)

## 2014-06-20 LAB — PREALBUMIN: Prealbumin: 13 mg/dL — ABNORMAL LOW (ref 17–34)

## 2014-06-20 LAB — SURGICAL PCR SCREEN
MRSA, PCR: NEGATIVE
Staphylococcus aureus: POSITIVE — AB

## 2014-06-20 SURGERY — AMPUTATION, FOOT, PARTIAL
Anesthesia: General | Site: Foot | Laterality: Right

## 2014-06-20 MED ORDER — IBUPROFEN 100 MG/5ML PO SUSP
200.0000 mg | Freq: Four times a day (QID) | ORAL | Status: DC | PRN
  Filled 2014-06-20: qty 20

## 2014-06-20 MED ORDER — INSULIN GLARGINE 100 UNIT/ML ~~LOC~~ SOLN
20.0000 [IU] | Freq: Every day | SUBCUTANEOUS | Status: DC
  Filled 2014-06-20: qty 0.2

## 2014-06-20 MED ORDER — INSULIN GLARGINE 100 UNIT/ML ~~LOC~~ SOLN
22.0000 [IU] | Freq: Every day | SUBCUTANEOUS | Status: DC
  Administered 2014-06-20: 22 [IU] via SUBCUTANEOUS
  Filled 2014-06-20 (×2): qty 0.22

## 2014-06-20 MED ORDER — OXYCODONE HCL 5 MG/5ML PO SOLN: 5.0000 mg | Freq: Once | ORAL | Status: DC | PRN

## 2014-06-20 MED ORDER — ONDANSETRON HCL 4 MG PO TABS: 4.0000 mg | ORAL_TABLET | Freq: Four times a day (QID) | ORAL | Status: DC | PRN

## 2014-06-20 MED ORDER — KETOROLAC TROMETHAMINE 30 MG/ML IJ SOLN: 30.0000 mg | Freq: Once | INTRAMUSCULAR | Status: DC | PRN

## 2014-06-20 MED ORDER — HYDROMORPHONE HCL 1 MG/ML IJ SOLN: 1.0000 mg | INTRAMUSCULAR | Status: DC | PRN

## 2014-06-20 MED ORDER — ACETAMINOPHEN 325 MG PO TABS
650.0000 mg | ORAL_TABLET | Freq: Four times a day (QID) | ORAL | Status: DC | PRN
  Administered 2014-06-21 – 2014-06-22 (×4): 650 mg via ORAL
  Filled 2014-06-20 (×4): qty 2

## 2014-06-20 MED ORDER — 0.9 % SODIUM CHLORIDE (POUR BTL) OPTIME
TOPICAL | Status: DC | PRN
  Administered 2014-06-20: 1000 mL

## 2014-06-20 MED ORDER — LACTATED RINGERS IV SOLN
INTRAVENOUS | Status: DC
  Administered 2014-06-20: 17:00:00 via INTRAVENOUS

## 2014-06-20 MED ORDER — HYDROCODONE-ACETAMINOPHEN 5-325 MG PO TABS
1.0000 | ORAL_TABLET | Freq: Four times a day (QID) | ORAL | Status: DC | PRN
Start: 2014-06-20 — End: 2014-06-22
  Administered 2014-06-20 (×2): 1 via ORAL
  Filled 2014-06-20 (×2): qty 1

## 2014-06-20 MED ORDER — LIDOCAINE HCL (CARDIAC) 20 MG/ML IV SOLN
INTRAVENOUS | Status: AC
  Filled 2014-06-20: qty 5

## 2014-06-20 MED ORDER — HYDROMORPHONE HCL 1 MG/ML IJ SOLN: 0.2500 mg | INTRAMUSCULAR | Status: DC | PRN

## 2014-06-20 MED ORDER — MEPERIDINE HCL 25 MG/ML IJ SOLN: 6.2500 mg | INTRAMUSCULAR | Status: DC | PRN

## 2014-06-20 MED ORDER — OXYCODONE HCL 5 MG PO TABS: 5.0000 mg | ORAL_TABLET | ORAL | Status: DC | PRN

## 2014-06-20 MED ORDER — PROPOFOL 10 MG/ML IV BOLUS
INTRAVENOUS | Status: AC
  Filled 2014-06-20: qty 20

## 2014-06-20 MED ORDER — METOCLOPRAMIDE HCL 5 MG/ML IJ SOLN
5.0000 mg | Freq: Three times a day (TID) | INTRAMUSCULAR | Status: DC | PRN
  Filled 2014-06-20: qty 2

## 2014-06-20 MED ORDER — OXYCODONE HCL 5 MG PO TABS: 5.0000 mg | ORAL_TABLET | Freq: Once | ORAL | Status: DC | PRN

## 2014-06-20 MED ORDER — IBUPROFEN 200 MG PO TABS: 200.0000 mg | ORAL_TABLET | Freq: Four times a day (QID) | ORAL | Status: DC | PRN

## 2014-06-20 MED ORDER — SODIUM CHLORIDE 0.9 % IV SOLN
INTRAVENOUS | Status: DC
  Administered 2014-06-20 – 2014-06-21 (×2): via INTRAVENOUS

## 2014-06-20 MED ORDER — LIDOCAINE HCL (CARDIAC) 20 MG/ML IV SOLN
INTRAVENOUS | Status: DC | PRN
  Administered 2014-06-20: 60 mg via INTRAVENOUS

## 2014-06-20 MED ORDER — METHOCARBAMOL 500 MG PO TABS
500.0000 mg | ORAL_TABLET | Freq: Four times a day (QID) | ORAL | Status: DC | PRN
Start: 2014-06-20 — End: 2014-06-23
  Filled 2014-06-20: qty 1

## 2014-06-20 MED ORDER — ONDANSETRON HCL 4 MG/2ML IJ SOLN: 4.0000 mg | Freq: Four times a day (QID) | INTRAMUSCULAR | Status: DC | PRN

## 2014-06-20 MED ORDER — PHENAZOPYRIDINE HCL 200 MG PO TABS
200.0000 mg | ORAL_TABLET | Freq: Three times a day (TID) | ORAL | Status: DC
  Filled 2014-06-20 (×2): qty 1

## 2014-06-20 MED ORDER — ACETAMINOPHEN 650 MG RE SUPP: 650.0000 mg | Freq: Four times a day (QID) | RECTAL | Status: DC | PRN

## 2014-06-20 MED ORDER — METHOCARBAMOL 1000 MG/10ML IJ SOLN
500.0000 mg | Freq: Four times a day (QID) | INTRAVENOUS | Status: DC | PRN
  Filled 2014-06-20: qty 5

## 2014-06-20 MED ORDER — PROPOFOL 10 MG/ML IV BOLUS
INTRAVENOUS | Status: DC | PRN
  Administered 2014-06-20: 120 mg via INTRAVENOUS

## 2014-06-20 MED ORDER — FENTANYL CITRATE (PF) 250 MCG/5ML IJ SOLN
INTRAMUSCULAR | Status: AC
  Filled 2014-06-20: qty 5

## 2014-06-20 MED ORDER — METOCLOPRAMIDE HCL 10 MG PO TABS: 5.0000 mg | ORAL_TABLET | Freq: Three times a day (TID) | ORAL | Status: DC | PRN

## 2014-06-20 MED ORDER — MUPIROCIN 2 % EX OINT
1.0000 "application " | TOPICAL_OINTMENT | Freq: Two times a day (BID) | CUTANEOUS | Status: DC
  Administered 2014-06-20 – 2014-06-22 (×6): 1 via NASAL
  Filled 2014-06-20: qty 22

## 2014-06-20 MED ORDER — INSULIN ASPART 100 UNIT/ML ~~LOC~~ SOLN
0.0000 [IU] | Freq: Three times a day (TID) | SUBCUTANEOUS | Status: DC
  Administered 2014-06-21 (×2): 5 [IU] via SUBCUTANEOUS
  Administered 2014-06-21: 3 [IU] via SUBCUTANEOUS

## 2014-06-20 MED ORDER — MIDAZOLAM HCL 5 MG/5ML IJ SOLN
INTRAMUSCULAR | Status: DC | PRN
  Administered 2014-06-20: 1 mg via INTRAVENOUS

## 2014-06-20 MED ORDER — MIDAZOLAM HCL 2 MG/2ML IJ SOLN
INTRAMUSCULAR | Status: AC
  Filled 2014-06-20: qty 2

## 2014-06-20 MED ORDER — CHLORHEXIDINE GLUCONATE CLOTH 2 % EX PADS
6.0000 | MEDICATED_PAD | Freq: Every day | CUTANEOUS | Status: DC
  Administered 2014-06-20 – 2014-06-23 (×4): 6 via TOPICAL

## 2014-06-20 MED ORDER — FENTANYL CITRATE (PF) 100 MCG/2ML IJ SOLN
INTRAMUSCULAR | Status: DC | PRN
  Administered 2014-06-20: 50 ug via INTRAVENOUS

## 2014-06-20 MED ORDER — PROMETHAZINE HCL 25 MG/ML IJ SOLN
12.5000 mg | Freq: Once | INTRAMUSCULAR | Status: AC
  Administered 2014-06-20: 12.5 mg via INTRAVENOUS
  Filled 2014-06-20: qty 1

## 2014-06-20 SURGICAL SUPPLY — 28 items
BLADE SAW SGTL HD 18.5X60.5X1. (BLADE) ×2 IMPLANT
BLADE SURG 10 STRL SS (BLADE) IMPLANT
BNDG COHESIVE 4X5 TAN STRL (GAUZE/BANDAGES/DRESSINGS) ×3 IMPLANT
BNDG GAUZE ELAST 4 BULKY (GAUZE/BANDAGES/DRESSINGS) ×3 IMPLANT
COVER SURGICAL LIGHT HANDLE (MISCELLANEOUS) ×3 IMPLANT
DRAPE U-SHAPE 47X51 STRL (DRAPES) ×1 IMPLANT
DRSG ADAPTIC 3X8 NADH LF (GAUZE/BANDAGES/DRESSINGS) ×2 IMPLANT
DRSG PAD ABDOMINAL 8X10 ST (GAUZE/BANDAGES/DRESSINGS) ×2 IMPLANT
DURAPREP 26ML APPLICATOR (WOUND CARE) ×2 IMPLANT
ELECT REM PT RETURN 9FT ADLT (ELECTROSURGICAL) ×2
ELECTRODE REM PT RTRN 9FT ADLT (ELECTROSURGICAL) ×1 IMPLANT
GAUZE SPONGE 4X4 12PLY STRL (GAUZE/BANDAGES/DRESSINGS) ×2 IMPLANT
GLOVE BIOGEL PI IND STRL 9 (GLOVE) ×1 IMPLANT
GLOVE BIOGEL PI INDICATOR 9 (GLOVE) ×1
GLOVE SURG ORTHO 9.0 STRL STRW (GLOVE) ×2 IMPLANT
GOWN STRL REUS W/ TWL XL LVL3 (GOWN DISPOSABLE) ×3 IMPLANT
GOWN STRL REUS W/TWL XL LVL3 (GOWN DISPOSABLE) ×4
KIT BASIN OR (CUSTOM PROCEDURE TRAY) ×2 IMPLANT
KIT ROOM TURNOVER OR (KITS) ×2 IMPLANT
NS IRRIG 1000ML POUR BTL (IV SOLUTION) ×2 IMPLANT
PACK ORTHO EXTREMITY (CUSTOM PROCEDURE TRAY) ×2 IMPLANT
PAD ARMBOARD 7.5X6 YLW CONV (MISCELLANEOUS) ×3 IMPLANT
SPONGE LAP 18X18 X RAY DECT (DISPOSABLE) ×2 IMPLANT
SUT ETHILON 2 0 PSLX (SUTURE) ×5 IMPLANT
SUT VIC AB 2-0 CTB1 (SUTURE) IMPLANT
TOWEL OR 17X24 6PK STRL BLUE (TOWEL DISPOSABLE) ×2 IMPLANT
TOWEL OR 17X26 10 PK STRL BLUE (TOWEL DISPOSABLE) ×2 IMPLANT
WATER STERILE IRR 1000ML POUR (IV SOLUTION) ×1 IMPLANT

## 2014-06-20 NOTE — Progress Notes (Signed)
Continue IV antibiotics for 48 hours postoperatively. Patient may be discharged to home with home health physical therapy or skilled nursing pending her ability to ambulate independently with physical therapy.

## 2014-06-20 NOTE — Anesthesia Postprocedure Evaluation (Signed)
Anesthesia Post Note  Patient: Ruth Douglas  Procedure(s) Performed: Procedure(s) (LRB): AMPUTATION FOOT (Right)  Anesthesia type: General  Patient location: PACU  Post pain: Pain level controlled and Adequate analgesia  Post assessment: Post-op Vital signs reviewed, Patient's Cardiovascular Status Stable, Respiratory Function Stable, Patent Airway and Pain level controlled  Last Vitals:  Filed Vitals:   06/20/14 1915  BP: 133/52  Pulse:   Temp:   Resp:     Post vital signs: Reviewed and stable  Level of consciousness: awake, alert  and oriented  Complications: No apparent anesthesia complications

## 2014-06-20 NOTE — Progress Notes (Signed)
Subjective: Ruth Douglas was seen by Dr Sharol Given yesterday who plans R transmetatarsal amputation today (scheduled for 5 pm). She was seen by night MD team for pelvic pain (see separate note). This morning she has some appropriate anxiety about the surgery. She says that she still has some pain with urination. She says the norco for uterine prolapse helps temporarily.   Objective: Vital signs in last 24 hours: Filed Vitals:   06/19/14 1332 06/19/14 2138 06/20/14 0515 06/20/14 0557  BP: 140/54 136/63 136/53   Pulse: 83 82 77   Temp: 98.2 F (36.8 C)  98.3 F (36.8 C)   TempSrc: Oral  Oral   Resp: '18 20 20   ' Height:      Weight:    137 lb 2 oz (62.2 kg)  SpO2: 97% 96% 97%    Weight change: -12 lb 14 oz (-5.839 kg)  Intake/Output Summary (Last 24 hours) at 06/20/14 3532 Last data filed at 06/19/14 1856  Gross per 24 hour  Intake    600 ml  Output    150 ml  Net    450 ml   Gen: A&O x 4, No acute distress, well developed, well nourished HEENT: Atraumatic, PERRL, EOMI, sclerae anicteric, moist mucous membranes Heart: Regular rate and rhythm, normal S1 S2, no murmurs, rubs, or gallops Lungs: Clear to auscultation bilaterally, respirations unlabored Abd: Soft, non-tender including suprapubic, no CVA tenderness, non-distended, + bowel sounds, no hepatosplenomegaly Ext: L foot s/p amputation of all digits, R foot s/p amputation of 1st and 2nd digit. R foot with ~ 1 cm x 1 cm ulcer on 3rd metatarsal pad with white purulent drainage, strong DP pulses  Lab Results: Basic Metabolic Panel:  Recent Labs Lab 06/18/14 1922 06/19/14 0820  NA 131* 130*  K 4.3 4.0  CL 95* 96  CO2 25 24  GLUCOSE 307* 301*  BUN 32* 30*  CREATININE 2.62* 2.38*  CALCIUM 9.0 8.5   Liver Function Tests:  Recent Labs Lab 06/19/14 0820  AST 14  ALT 12  ALKPHOS 66  BILITOT 0.5  PROT 6.9  ALBUMIN 3.0*   CBC:  Recent Labs Lab 06/18/14 1615 06/19/14 0820  WBC 6.4 4.8  NEUTROABS 4.8 2.6  HGB 10.7*  9.7*  HCT 32.5* 29.1*  MCV 81.7 82.7  PLT 345 315   CBG:  Recent Labs Lab 06/19/14 0029 06/19/14 0745 06/19/14 1151 06/19/14 1810 06/19/14 2138 06/20/14 0756  GLUCAP 213* 292* 251* 386* 225* 234*   Anemia Panel:  Recent Labs Lab 06/19/14 0820  VITAMINB12 628  FOLATE >20.0  FERRITIN 113  TIBC 249*  IRON 35*  RETICCTPCT 0.9   Urinalysis:  Recent Labs Lab 06/18/14 1620  COLORURINE YELLOW  LABSPEC 1.017  PHURINE 6.0  GLUCOSEU 100*  HGBUR LARGE*  BILIRUBINUR NEGATIVE  KETONESUR NEGATIVE  PROTEINUR 100*  UROBILINOGEN 0.2  NITRITE NEGATIVE  LEUKOCYTESUR LARGE*  few squam, WBC too numerous to count, 0-2 RBC, few bacteria  Misc. Labs: Lactic acid 2.04-->1.12, 1.38 CRP 6.5 ESR 119  Micro Results: Recent Results (from the past 240 hour(s))  Blood culture (routine x 2)     Status: None (Preliminary result)   Collection Time: 06/18/14  4:15 PM  Result Value Ref Range Status   Specimen Description BLOOD ARM LEFT  Final   Special Requests BOTTLES DRAWN AEROBIC AND ANAEROBIC 5CC  Final   Culture   Final           BLOOD CULTURE RECEIVED NO GROWTH TO DATE  CULTURE WILL BE HELD FOR 5 DAYS BEFORE ISSUING A FINAL NEGATIVE REPORT Performed at [facility]    Report Status PENDING  Incomplete  Urine culture     Status: None   Collection Time: 06/18/14  4:20 PM  Result Value Ref Range Status   Specimen Description URINE, RANDOM  Final   Special Requests bactrim  Final   Colony Count   Final    50,000 COLONIES/ML Performed at [facility]    Culture   Final    Multiple bacterial morphotypes present, none predominant. Suggest appropriate recollection if clinically indicated. Performed at [facility]    Report Status 06/20/2014 FINAL  Final  Blood culture (routine x 2)     Status: None (Preliminary result)   Collection Time: 06/18/14  4:24 PM  Result Value Ref Range Status   Specimen Description BLOOD ARM RIGHT  Final   Special Requests  BOTTLES DRAWN AEROBIC AND ANAEROBIC 5CC  Final   Culture   Final           BLOOD CULTURE RECEIVED NO GROWTH TO DATE CULTURE WILL BE HELD FOR 5 DAYS BEFORE ISSUING A FINAL NEGATIVE REPORT Performed at [facility]    Report Status PENDING  Incomplete  Surgical pcr screen     Status: Abnormal   Collection Time: 06/19/14  9:36 PM  Result Value Ref Range Status   MRSA, PCR NEGATIVE NEGATIVE Final   Staphylococcus aureus POSITIVE (A) NEGATIVE Final    Comment:        The Xpert SA Assay (FDA approved for NASAL specimens in patients over 20 years of age), is one component of a comprehensive surveillance program.  Test performance has been validated by [facility] for patients greater than or equal to 60 year old. It is not intended to diagnose infection nor to guide or monitor treatment.    Studies/Results: US Renal  06/19/2014   CLINICAL DATA:  Acute renal insufficiency  EXAM: RENAL/URINARY TRACT ULTRASOUND COMPLETE  COMPARISON:  CT scan 11/20/2012  FINDINGS: Right Kidney:  Length: 10.3 cm. Relatively normal renal cortical thickness. Lobulated borders could be due to feel lobulation or prior scarring. Normal renal cortical echogenicity. Small upper pole cysts are noted. No hydronephrosis.  Left Kidney:  Length: 10.4 cm. Normal echogenicity and mild renal cortical thinning. No hydronephrosis. A lower pole renal calculus is noted.  Bladder:  Grossly normal but poorly distended.  IMPRESSION: Upper pole right renal cysts and lower pole left renal calculus.  No hydronephrosis.   Electronically Signed   By: Marijo Sanes M.D.   On: 06/19/2014 18:38   Mr Foot Right Wo Contrast  06/19/2014   CLINICAL DATA:  Uncontrolled type 2 diabetes. Nephropathy and neuropathy. Neuropathy. Foot amputation. Osteomyelitis of the foot.  EXAM: MRI OF THE RIGHT FOREFOOT WITHOUT CONTRAST  TECHNIQUE: Multiplanar, multisequence MR imaging was performed. No intravenous contrast was administered.  COMPARISON:   Radiographs 06/18/2014.  FINDINGS: Osteomyelitis of the distal third metatarsal is present. There is an ulcer tracking from the plantar aspect of the third metatarsal head. The phalanges of the third toe or dislocated, better demonstrated on prior plain films 06/18/2014. The ulcer tunnel is to the third metatarsal head and there is a small abscess along the dorsal third metatarsal head measuring 16 mm x 12 mm by 23 mm plantar to dorsal. The abscess is interposed between the third and fourth metatarsal heads.  Bone marrow edema radiates through the third metatarsal shaft to the  base of the metatarsal. Neuropathic midfoot. There is a fourth MTP joint effusion which may be reactive or represent septic arthritis from the adjacent abscess. The intermetatarsal component of the abscess is best viewed on axial imaging (image 25 series 5). First and second toe amputations noted.  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 Abscess between the third and fourth metatarsal heads. Fourth MTP joint effusion which may be reactive or represent septic arthritis.   Electronically Signed   By: Dereck Ligas M.D.   On: 06/19/2014 08:23   Dg Foot Complete Right  06/18/2014   CLINICAL DATA:  Swelling RIGHT toes for 1 week, open wound at bottom of RIGHT foot at MTP joint region, history diabetes  EXAM: RIGHT FOOT COMPLETE - 3+ VIEW  COMPARISON:  01/15/2012  FINDINGS: Prior amputations of great toe and of second toe at the mid metatarsal level.  Dislocation at third MTP joint.  Diffuse osseous demineralization and scattered soft tissue swelling.  Scattered intertarsal degenerative changes.  Mild sclerosis of the distal second metatarsal similar to previous exam question old healed fracture or stress response.  No definite acute fracture, dislocation or bone destruction.  IMPRESSION: Amputations of the first and second toes.  Osseous demineralization  with scattered degenerative changes and soft tissue swelling.  No definite acute osseous findings.  If patient has persistent clinical suspicion of osteomyelitis or deep soft tissue infection, recommend MR.   Electronically Signed   By: Lavonia Dana M.D.   On: 06/18/2014 16:57   Medications: I have reviewed the patient's current medications. Scheduled Meds: . amLODipine  5 mg Oral Daily  .  ceFAZolin (ANCEF) IV  2 g Intravenous On Call to OR  . Chlorhexidine Gluconate Cloth  6 each Topical Daily  . feeding supplement (GLUCERNA SHAKE)  237 mL Oral BID BM  . ferrous sulfate  325 mg Oral Q breakfast  . heparin  5,000 Units Subcutaneous 3 times per day  . insulin aspart  0-9 Units Subcutaneous TID WC  . insulin glargine  17 Units Subcutaneous QHS  . multivitamin with minerals  1 tablet Oral Daily  . mupirocin ointment  1 application Nasal BID  . piperacillin-tazobactam (ZOSYN)  IV  2.25 g Intravenous 3 times per day  . vancomycin  750 mg Intravenous Q24H   Continuous Infusions: . sodium chloride 75 mL/hr at 06/20/14 0726   PRN Meds:. Assessment/Plan: Principal Problem:   Diabetic foot infection Active Problems:   DM2 (diabetes mellitus, type 2)   Diabetic foot ulcer   Acute-on-chronic kidney injury   Anemia   UTI (urinary tract infection)   Essential hypertension   Diabetic osteomyelitis   Septic arthritis of foot  #Infected diabetic R foot ulcer with 3rd metatarsal osteomyelitis  Ruth Cyran has diabetic foot ulcer with MRI demonstrating osteomyelitis of the 3rd metatarsal head w sinus tract extending to ulcer plantar to the 3rd metatarsal head as well as abscess between the 3rd and 4th metatarsal heads.  Initially received vanc/zosyn in ED but was put on ceftriaxone/flagyl for diabetic foot ulcer which was broadened to vanc/zosyn with MRI results. She remains afebrile with no leukocytosis. She was seen by Dr Sharol Given who plans for transmetatarsal amputation today.  -appreciate ortho,  wound care -vancomycin and zosyn per pharmacy (they will dose for renal impairment) -Normal saline at 75 mL per hour. -norco 5-325 q6hprn -Follow-up final blood culture x 2 -PT/OT  #Urinary tract infection  Started treatment one day prior to presentation and is now on broad spectrum antibiotics. She says dysuria has improved some but still present but no systemic complaints. As above, no fever, leukocytosis, or CVA tenderness on exam. UCx with 50,000 colonies of multiple bacterial morphotypes, non predominant -Antibiotics as above.  #Uterine prolapse: Patient examined by night MD team for pressure sensation in pelvis. They note uterine prolapse and started norco. She needs out-patient gynecology follow-up which she says helps some -norco 5-325 q6hprn -out-patient gyn follow-up  #Acute on chronic kidney disease Creatinine elevated to 2.62 on presentation, up from 1.08 in 2014. This morning pending but down to 2.38 yesterday following gentle hydration NS 75 cc/hr. This may represent progressive CKD as she has FeNa 1.4% although renal ultrasound negative or possible prerenal kidney injury from infection -NS @ 75 cc/hr -cont to monitor  #Type 2 diabetes Glucose elevated to 307 on presentation, likely poor long-term control of diabetes. Hemoglobin A1c was only 7.3 in 2013. Reports not taking in metformin currently. AM CBG 234. Seen by diabetes coordinator who recommended going up on lantus to 17 u. -appreciate diabetes coordinator -f/u hemoglobin A1c. -Continue home Lantus 17 units daily at bedtime. -CBGs before meals and at bedtime with sliding scale insulin.  #Anemia Hemoglobin elevated 10.7 on presentation, yesterday morning 9.7 (none yet today), from previous baseline in 2014 of approximately 8. MCV 83 with normal folate, vitamin b12, low iron 35 TIBC 249, ferritin normal at 113. No previous anemia panel in our system. She was previously on ferrous sulfate. -cont ferrous sulfate 325 mg po  daily -Trend hemoglobin.  #Hyperlipidemia Reports not taking atorvastatin 40 mg daily. -PCP should consider restarting statin  #Hypertension BP 110s-140s/50s-60s overnight and this morning -cont amlodipine 5 mg daily  #DVT prophylaxis -Heparin.  Dispo: Disposition is deferred at this time, awaiting improvement of current medical problems.  Anticipated discharge in approximately 2 day(s).   The patient does not know have a current PCP (No Pcp Per Patient) and does need an [facility] hospital follow-up appointment after discharge.  The patient does not know have transportation limitations that hinder transportation to clinic appointments.  .Services Needed at time of discharge: Y = Yes, Blank = No PT:   OT:   RN:   Equipment:   Other:     LOS: 2 days   Kelby Aline, MD 06/20/2014, 8:22 AM

## 2014-06-20 NOTE — Transfer of Care (Signed)
Immediate Anesthesia Transfer of Care Note  Patient: Ruth Douglas  Procedure(s) Performed: Procedure(s): AMPUTATION FOOT (Right)  Patient Location: PACU  Anesthesia Type:General  Level of Consciousness: patient cooperative and lethargic  Airway & Oxygen Therapy: Patient Spontanous Breathing and Patient connected to nasal cannula oxygen  Post-op Assessment: Report given to RN, Post -op Vital signs reviewed and stable, Patient moving all extremities, Patient moving all extremities X 4 and Patient able to stick tongue midline  Post vital signs: Reviewed and stable  Last Vitals:  Filed Vitals:   06/20/14 1413  BP: 154/69  Pulse:   Temp: 36.6 C  Resp: 16    Complications: No apparent anesthesia complications

## 2014-06-20 NOTE — Anesthesia Procedure Notes (Signed)
Procedure Name: LMA Insertion Date/Time: 06/20/2014 6:24 PM Performed by: Melina Copa, Yuvaan Olander R Pre-anesthesia Checklist: Patient identified, Emergency Drugs available, Suction available, Patient being monitored and Timeout performed Patient Re-evaluated:Patient Re-evaluated prior to inductionOxygen Delivery Method: Circle system utilized Preoxygenation: Pre-oxygenation with 100% oxygen Intubation Type: IV induction Ventilation: Mask ventilation without difficulty LMA: LMA inserted LMA Size: 4.0 Number of attempts: 1 Tube secured with: Tape Dental Injury: Teeth and Oropharynx as per pre-operative assessment

## 2014-06-20 NOTE — H&P (View-Only) (Signed)
Reason for Consult: Abscess osteomyelitis right forefoot Referring Physician: Dr. Murphy  Ruth Douglas is an 73 y.o. female.  HPI: Patient is a 73-year-old woman status post transmetatarsal amputation on the left and status post first and second ray amputations on the right who presents with abscess ulceration beneath the third metatarsal head with cellulitis of the forefoot. Patient has undergone transmetatarsal amputation the left by Dr. Hewitt. I discussed her case with Dr. Hewitt and he would like me to assume care for the right foot.  Past Medical History  Diagnosis Date  . Renal disorder   . Blood transfusion without reported diagnosis   . Diabetes mellitus without complication   . Hypertension   . CHF (congestive heart failure)     Past Surgical History  Procedure Laterality Date  . Toe amputation    . Cesarean section    . Cystoscopy with stent placement  01/16/2012    Procedure: CYSTOSCOPY WITH STENT PLACEMENT;  Surgeon: Les Borden, MD;  Location: [facility];  Service: Urology;  Laterality: Left;  cysto, retrograde , ureteroscopy , stone extraction with laser lithotripsy, stent placement on left, bladder biopsies with fulgaration  . Holmium laser application  01/16/2012    Procedure: HOLMIUM LASER APPLICATION;  Surgeon: Les Borden, MD;  Location: [facility];  Service: Urology;  Laterality: Left;  . Amputation Left 06/01/2012    Procedure: LEFT FOOT TRANSMETATARSEL AMPUTATION;  Surgeon: John Hewitt, MD;  Location: [facility];  Service: Orthopedics;  Laterality: Left;  . Achilles tendon surgery Left 06/01/2012    Procedure: PERCUTANEOUS TENDON ACHILLES LENGTHING;  Surgeon: John Hewitt, MD;  Location: [facility];  Service: Orthopedics;  Laterality: Left;    Family History  Problem Relation Age of Onset  . Diabetes Mellitus II Mother   . Diabetes Mellitus II Father   . Lung cancer Sister   . Kidney cancer Brother     Social History:  reports that she has never smoked. She does not have any  smokeless tobacco history on file. She reports that she does not drink alcohol or use illicit drugs.  Allergies: No Known Allergies  Medications: I have reviewed the patient's current medications.  Results for orders placed or performed during the hospital encounter of 06/18/14 (from the past 48 hour(s))  CBC with Differential/Platelet     Status: Abnormal   Collection Time: 06/18/14  4:15 PM  Result Value Ref Range   WBC 6.4 4.0 - 10.5 K/uL   RBC 3.98 3.87 - 5.11 MIL/uL   Hemoglobin 10.7 (L) 12.0 - 15.0 g/dL   HCT 32.5 (L) 36.0 - 46.0 %   MCV 81.7 78.0 - 100.0 fL   MCH 26.9 26.0 - 34.0 pg   MCHC 32.9 30.0 - 36.0 g/dL   RDW 13.1 11.5 - 15.5 %   Platelets 345 150 - 400 K/uL   Neutrophils Relative % 74 43 - 77 %   Neutro Abs 4.8 1.7 - 7.7 K/uL   Lymphocytes Relative 19 12 - 46 %   Lymphs Abs 1.2 0.7 - 4.0 K/uL   Monocytes Relative 7 3 - 12 %   Monocytes Absolute 0.4 0.1 - 1.0 K/uL   Eosinophils Relative 0 0 - 5 %   Eosinophils Absolute 0.0 0.0 - 0.7 K/uL   Basophils Relative 0 0 - 1 %   Basophils Absolute 0.0 0.0 - 0.1 K/uL  Blood culture (routine x 2)     Status: None (Preliminary result)   Collection Time: 06/18/14  4:15 PM    Result Value Ref Range   Specimen Description BLOOD ARM LEFT    Special Requests BOTTLES DRAWN AEROBIC AND ANAEROBIC 5CC    Culture             BLOOD CULTURE RECEIVED NO GROWTH TO DATE CULTURE WILL BE HELD FOR 5 DAYS BEFORE ISSUING A FINAL NEGATIVE REPORT Performed at [facility]    Report Status PENDING   Sedimentation rate     Status: Abnormal   Collection Time: 06/18/14  4:15 PM  Result Value Ref Range   Sed Rate 119 (H) 0 - 22 mm/hr  Urinalysis, Routine w reflex microscopic     Status: Abnormal   Collection Time: 06/18/14  4:20 PM  Result Value Ref Range   Color, Urine YELLOW YELLOW   APPearance TURBID (A) CLEAR   Specific Gravity, Urine 1.017 1.005 - 1.030   pH 6.0 5.0 - 8.0   Glucose, UA 100 (A) NEGATIVE mg/dL   Hgb urine dipstick  LARGE (A) NEGATIVE   Bilirubin Urine NEGATIVE NEGATIVE   Ketones, ur NEGATIVE NEGATIVE mg/dL   Protein, ur 100 (A) NEGATIVE mg/dL   Urobilinogen, UA 0.2 0.0 - 1.0 mg/dL   Nitrite NEGATIVE NEGATIVE   Leukocytes, UA LARGE (A) NEGATIVE  Urine microscopic-add on     Status: Abnormal   Collection Time: 06/18/14  4:20 PM  Result Value Ref Range   Squamous Epithelial / LPF FEW (A) RARE   WBC, UA TOO NUMEROUS TO COUNT <3 WBC/hpf   RBC / HPF 0-2 <3 RBC/hpf   Bacteria, UA FEW (A) RARE  Blood culture (routine x 2)     Status: None (Preliminary result)   Collection Time: 06/18/14  4:24 PM  Result Value Ref Range   Specimen Description BLOOD ARM RIGHT    Special Requests BOTTLES DRAWN AEROBIC AND ANAEROBIC 5CC    Culture             BLOOD CULTURE RECEIVED NO GROWTH TO DATE CULTURE WILL BE HELD FOR 5 DAYS BEFORE ISSUING A FINAL NEGATIVE REPORT Performed at [facility]    Report Status PENDING   I-Stat CG4 Lactic Acid, ED     Status: Abnormal   Collection Time: 06/18/14  4:46 PM  Result Value Ref Range   Lactic Acid, Venous 2.04 (HH) 0.5 - 2.0 mmol/L   Comment NOTIFIED PHYSICIAN   C-reactive protein     Status: Abnormal   Collection Time: 06/18/14  7:22 PM  Result Value Ref Range   CRP 6.5 (H) <0.60 mg/dL    Comment: Performed at [place] metabolic panel     Status: Abnormal   Collection Time: 06/18/14  7:22 PM  Result Value Ref Range   Sodium 131 (L) 135 - 145 mmol/L   Potassium 4.3 3.5 - 5.1 mmol/L   Chloride 95 (L) 96 - 112 mmol/L   CO2 25 19 - 32 mmol/L   Glucose, Bld 307 (H) 70 - 99 mg/dL   BUN 32 (H) 6 - 23 mg/dL   Creatinine, Ser 2.62 (H) 0.50 - 1.10 mg/dL   Calcium 9.0 8.4 - 10.5 mg/dL   GFR calc non Af Amer 17 (L) >90 mL/min   GFR calc Af Amer 20 (L) >90 mL/min    Comment: (NOTE) The eGFR has been calculated using the CKD EPI equation. This calculation has not been validated in all clinical situations. eGFR's persistently <90 mL/min signify  possible Chronic Kidney Disease.    Anion gap 11  5 - 15  I-Stat CG4 Lactic Acid, ED     Status: None   Collection Time: 06/18/14  7:31 PM  Result Value Ref Range   Lactic Acid, Venous 1.12 0.5 - 2.0 mmol/L  I-Stat CG4 Lactic Acid, ED     Status: None   Collection Time: 06/18/14  7:56 PM  Result Value Ref Range   Lactic Acid, Venous 1.38 0.5 - 2.0 mmol/L  Glucose, capillary     Status: Abnormal   Collection Time: 06/19/14 12:29 AM  Result Value Ref Range   Glucose-Capillary 213 (H) 70 - 99 mg/dL   Comment 1 Notify RN   Glucose, capillary     Status: Abnormal   Collection Time: 06/19/14  7:45 AM  Result Value Ref Range   Glucose-Capillary 292 (H) 70 - 99 mg/dL  CBC with Differential     Status: Abnormal   Collection Time: 06/19/14  8:20 AM  Result Value Ref Range   WBC 4.8 4.0 - 10.5 K/uL   RBC 3.52 (L) 3.87 - 5.11 MIL/uL   Hemoglobin 9.7 (L) 12.0 - 15.0 g/dL   HCT 29.1 (L) 36.0 - 46.0 %   MCV 82.7 78.0 - 100.0 fL   MCH 27.6 26.0 - 34.0 pg   MCHC 33.3 30.0 - 36.0 g/dL   RDW 13.4 11.5 - 15.5 %   Platelets 315 150 - 400 K/uL   Neutrophils Relative % 54 43 - 77 %   Neutro Abs 2.6 1.7 - 7.7 K/uL   Lymphocytes Relative 34 12 - 46 %   Lymphs Abs 1.6 0.7 - 4.0 K/uL   Monocytes Relative 9 3 - 12 %   Monocytes Absolute 0.5 0.1 - 1.0 K/uL   Eosinophils Relative 3 0 - 5 %   Eosinophils Absolute 0.2 0.0 - 0.7 K/uL   Basophils Relative 0 0 - 1 %   Basophils Absolute 0.0 0.0 - 0.1 K/uL  Comprehensive metabolic panel     Status: Abnormal   Collection Time: 06/19/14  8:20 AM  Result Value Ref Range   Sodium 130 (L) 135 - 145 mmol/L   Potassium 4.0 3.5 - 5.1 mmol/L   Chloride 96 96 - 112 mmol/L   CO2 24 19 - 32 mmol/L   Glucose, Bld 301 (H) 70 - 99 mg/dL   BUN 30 (H) 6 - 23 mg/dL   Creatinine, Ser 2.38 (H) 0.50 - 1.10 mg/dL   Calcium 8.5 8.4 - 10.5 mg/dL   Total Protein 6.9 6.0 - 8.3 g/dL   Albumin 3.0 (L) 3.5 - 5.2 g/dL   AST 14 0 - 37 U/L   ALT 12 0 - 35 U/L   Alkaline  Phosphatase 66 39 - 117 U/L   Total Bilirubin 0.5 0.3 - 1.2 mg/dL   GFR calc non Af Amer 19 (L) >90 mL/min   GFR calc Af Amer 22 (L) >90 mL/min    Comment: (NOTE) The eGFR has been calculated using the CKD EPI equation. This calculation has not been validated in all clinical situations. eGFR's persistently <90 mL/min signify possible Chronic Kidney Disease.    Anion gap 10 5 - 15  Vitamin B12     Status: None   Collection Time: 06/19/14  8:20 AM  Result Value Ref Range   Vitamin B-12 628 211 - 911 pg/mL    Comment: Performed at [facility]  Folate     Status: None   Collection Time: 06/19/14  8:20 AM  Result Value Ref   Range   Folate >20.0 ng/mL    Comment: (NOTE) Reference Ranges        Deficient:       0.4 - 3.3 ng/mL        Indeterminate:   3.4 - 5.4 ng/mL        Normal:              > 5.4 ng/mL Performed at [facility]   Iron and TIBC     Status: Abnormal   Collection Time: 06/19/14  8:20 AM  Result Value Ref Range   Iron 35 (L) 42 - 145 ug/dL   TIBC 249 (L) 250 - 470 ug/dL   Saturation Ratios 14 (L) 20 - 55 %   UIBC 214 125 - 400 ug/dL    Comment: Performed at [facility]  Ferritin     Status: None   Collection Time: 06/19/14  8:20 AM  Result Value Ref Range   Ferritin 113 10 - 291 ng/mL    Comment: Performed at [facility]  Reticulocytes     Status: Abnormal   Collection Time: 06/19/14  8:20 AM  Result Value Ref Range   Retic Ct Pct 0.9 0.4 - 3.1 %   RBC. 3.56 (L) 3.87 - 5.11 MIL/uL   Retic Count, Manual 32.0 19.0 - 186.0 K/uL  Sodium, urine, random     Status: None   Collection Time: 06/19/14 10:10 AM  Result Value Ref Range   Sodium, Ur 65 mmol/L  Creatinine, urine, random     Status: None   Collection Time: 06/19/14 10:10 AM  Result Value Ref Range   Creatinine, Urine 95.13 mg/dL  Glucose, capillary     Status: Abnormal   Collection Time: 06/19/14 11:51 AM  Result Value Ref Range   Glucose-Capillary 251 (H) 70 - 99  mg/dL  Glucose, capillary     Status: Abnormal   Collection Time: 06/19/14  6:10 PM  Result Value Ref Range   Glucose-Capillary 386 (H) 70 - 99 mg/dL    US Renal  06/19/2014   CLINICAL DATA:  Acute renal insufficiency  EXAM: RENAL/URINARY TRACT ULTRASOUND COMPLETE  COMPARISON:  CT scan 11/20/2012  FINDINGS: Right Kidney:  Length: 10.3 cm. Relatively normal renal cortical thickness. Lobulated borders could be due to feel lobulation or prior scarring. Normal renal cortical echogenicity. Small upper pole cysts are noted. No hydronephrosis.  Left Kidney:  Length: 10.4 cm. Normal echogenicity and mild renal cortical thinning. No hydronephrosis. A lower pole renal calculus is noted.  Bladder:  Grossly normal but poorly distended.  IMPRESSION: Upper pole right renal cysts and lower pole left renal calculus.  No hydronephrosis.   Electronically Signed   By: Marijo Sanes M.D.   On: 06/19/2014 18:38   Mr Foot Right Wo Contrast  06/19/2014   CLINICAL DATA:  Uncontrolled type 2 diabetes. Nephropathy and neuropathy. Neuropathy. Foot amputation. Osteomyelitis of the foot.  EXAM: MRI OF THE RIGHT FOREFOOT WITHOUT CONTRAST  TECHNIQUE: Multiplanar, multisequence MR imaging was performed. No intravenous contrast was administered.  COMPARISON:  Radiographs 06/18/2014.  FINDINGS: Osteomyelitis of the distal third metatarsal is present. There is an ulcer tracking from the plantar aspect of the third metatarsal head. The phalanges of the third toe or dislocated, better demonstrated on prior plain films 06/18/2014. The ulcer tunnel is to the third metatarsal head and there is a small abscess along the dorsal third metatarsal head measuring 16 mm x 12 mm by 23 mm plantar  to dorsal. The abscess is interposed between the third and fourth metatarsal heads.  Bone marrow edema radiates through the third metatarsal shaft to the base of the metatarsal. Neuropathic midfoot. There is a fourth MTP joint effusion which may be reactive or  represent septic arthritis from the adjacent abscess. The intermetatarsal component of the abscess is best viewed on axial imaging (image 25 series 5). First and second toe amputations noted.  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 Abscess between the third and fourth metatarsal heads. Fourth MTP joint effusion which may be reactive or represent septic arthritis.   Electronically Signed   By: Dereck Ligas M.D.   On: 06/19/2014 08:23   Dg Foot Complete Right  06/18/2014   CLINICAL DATA:  Swelling RIGHT toes for 1 week, open wound at bottom of RIGHT foot at MTP joint region, history diabetes  EXAM: RIGHT FOOT COMPLETE - 3+ VIEW  COMPARISON:  01/15/2012  FINDINGS: Prior amputations of great toe and of second toe at the mid metatarsal level.  Dislocation at third MTP joint.  Diffuse osseous demineralization and scattered soft tissue swelling.  Scattered intertarsal degenerative changes.  Mild sclerosis of the distal second metatarsal similar to previous exam question old healed fracture or stress response.  No definite acute fracture, dislocation or bone destruction.  IMPRESSION: Amputations of the first and second toes.  Osseous demineralization with scattered degenerative changes and soft tissue swelling.  No definite acute osseous findings.  If patient has persistent clinical suspicion of osteomyelitis or deep soft tissue infection, recommend MR.   Electronically Signed   By: Lavonia Dana M.D.   On: 06/18/2014 16:57    Review of Systems  All other systems reviewed and are negative.  Blood pressure 140/54, pulse 83, temperature 98.2 F (36.8 C), temperature source Oral, resp. rate 18, height 5' 3" (1.6 m), weight 67.4 kg (148 lb 9.4 oz), SpO2 97 %. Physical Exam On examination patient is alert and oriented no adenopathy she has a stable transmetatarsal amputation on the left. The right foot she has ulcer  osteomyelitis beneath the third metatarsal head right foot with purulent drainage exposed bone she is status post a first and second Ray amputation. She has a good dorsalis pedis pulse. Assessment/Plan: Assessment: Osteomyelitis ulceration abscess right foot third metatarsal head status post first and second Ray amputation with deformity to the right foot.  Plan: I will plan for transmetatarsal amputation on the right risk and benefits were discussed patient states she understands wishes to proceed at this time plan for surgery tomorrow.  Perl Kerney V 06/19/2014, 7:32 PM

## 2014-06-20 NOTE — Op Note (Signed)
06/18/2014 - 06/20/2014  6:51 PM  PATIENT:  Ruth Douglas    PRE-OPERATIVE DIAGNOSIS:  right transmetatarsal  POST-OPERATIVE DIAGNOSIS:  Same  PROCEDURE: Transmetatarsal AMPUTATION RIGHT FOOT  SURGEON:  Newt Minion, MD  PHYSICIAN ASSISTANT:None ANESTHESIA:   General  PREOPERATIVE INDICATIONS:  Ruth Douglas is a  74 y.o. female with a diagnosis of right transmetatarsal who failed conservative measures and elected for surgical management.    The risks benefits and alternatives were discussed with the patient preoperatively including but not limited to the risks of infection, bleeding, nerve injury, cardiopulmonary complications, the need for revision surgery, among others, and the patient was willing to proceed.  OPERATIVE IMPLANTS: None  OPERATIVE FINDINGS: Calcified vessels with peripheral vascular disease  OPERATIVE PROCEDURE: Patient is a 74 year old woman with abscess osteomyelitis of the right forefoot. H was brought to the operating room and underwent a general anesthetic. After adequate levels of anesthesia were obtained patient's right lower extremity was prepped using DuraPrep draped into a sterile field. A timeout was called. A fishmouth incision was made through the forefoot proximal to the abscess and ulceration. Patient underwent a transmetatarsal amputation the abscess and ulcerative tissue was not exposed to the surgical wound. When according was used for hemostasis. The wound was irrigated with normal saline. The wound was closed using 2-0 nylon. A sterile compressive dressing was applied. Patient was extubated taken to the PACU in stable condition.

## 2014-06-20 NOTE — Anesthesia Preprocedure Evaluation (Signed)
Anesthesia Evaluation  Patient identified by MRN, date of birth, ID band Patient awake    Reviewed: Allergy & Precautions, NPO status , Patient's Chart, lab work & pertinent test results  Airway Mallampati: II  TM Distance: >3 FB Neck ROM: Full    Dental  (+) Teeth Intact, Dental Advisory Given   Pulmonary  breath sounds clear to auscultation        Cardiovascular hypertension, Pt. on medications Rhythm:Regular Rate:Normal     Neuro/Psych    GI/Hepatic   Endo/Other  diabetes, Well Controlled, Type 2, Insulin Dependent, Oral Hypoglycemic Agents  Renal/GU Renal InsufficiencyRenal disease     Musculoskeletal   Abdominal   Peds  Hematology   Anesthesia Other Findings   Reproductive/Obstetrics                             Anesthesia Physical Anesthesia Plan  ASA: III  Anesthesia Plan: General   Post-op Pain Management:    Induction: Intravenous  Airway Management Planned: LMA  Additional Equipment:   Intra-op Plan:   Post-operative Plan: Extubation in OR  Informed Consent: I have reviewed the patients History and Physical, chart, labs and discussed the procedure including the risks, benefits and alternatives for the proposed anesthesia with the patient or authorized representative who has indicated his/her understanding and acceptance.   Dental advisory given  Plan Discussed with: CRNA, Anesthesiologist and Surgeon  Anesthesia Plan Comments:         Anesthesia Quick Evaluation

## 2014-06-20 NOTE — Progress Notes (Signed)
Inpatient Diabetes Program Recommendations  AACE/ADA: New Consensus Statement on Inpatient Glycemic Control (2013)  Target Ranges:  Prepandial:   less than 140 mg/dL      Peak postprandial:   less than 180 mg/dL (1-2 hours)      Critically ill patients:  140 - 180 mg/dL   Results for Ruth Douglas, Ruth Douglas (MRN [id number]) as of 06/20/2014 08:57  Ref. Range 06/19/2014 07:45 06/19/2014 11:51 06/19/2014 18:10 06/19/2014 21:38 06/20/2014 07:56  Glucose-Capillary Latest Ref Range: 70-99 mg/dL 292 (H) 251 (H) 386 (H) 225 (H) 234 (H)   Diabetes history: DM2 Outpatient Diabetes medications: Lantus 10 units QHS, Metformin 1000 mg BID Current orders for Inpatient glycemic control: Lantus 17 units QHS, Novolog 0-9 units TID with meals  Inpatient Diabetes Program Recommendations Insulin - Basal: Please consider increasing Lantus to 20 units QHS. Correction (SSI): Please consider increasing Novolog correction to moderate scale and adding Novolog bedtime correction scale.  Thanks, Barnie Alderman, RN, MSN, CCRN, CDE Diabetes Coordinator Inpatient Diabetes Program [phone] (Team Pager from [place] to [place]) [phone] ([facility] office) [phone] [facility] [place] office)

## 2014-06-20 NOTE — Progress Notes (Signed)
Internal Medicine Night Float Interim Progress Note  S: Called by nursing at 0130 that patient was complaining of continued perineal burning along with burning of her face.  Went to see patient who reports that she continues to have perineal burning and discomfort associated with urinary incontinence.  She says that something is not right, and her symptoms have gotten worse since yesterday.  About an hour ago, she developed burning on her face involving both cheeks and chin.  She thinks it may be from being upset in relation to her urinary discomfort.  She says that she has had similar facial burning at home.  She also wonders if it could be related to high blood sugar.  She denies any itching, rash, shortness of breath, throat swelling, or chest pain.  She reports a history of 2 vaginal births.  ODanley Danker Vitals:   06/19/14 2138  BP: 136/63  Pulse: 82  Temp: 98.2  Resp: 20   Physical Exam  Constitutional: She is oriented to person, place, and time. She appears distressed.  [facility] face with hospital brochure.  Eyes: Conjunctivae and EOM are normal. Pupils are equal, round, and reactive to light. No scleral icterus.  Neck: Normal range of motion. Neck supple.  Cardiovascular: Normal rate, regular rhythm and normal heart sounds.   Pulmonary/Chest: Effort normal and breath sounds normal. No respiratory distress. She has no wheezes.  Abdominal: Soft. Bowel sounds are normal. She exhibits no distension. There is no tenderness.  Genitourinary:  Uterus prolapsing into vagina.  Neurological: She is alert and oriented to person, place, and time. No cranial nerve deficit. She exhibits normal muscle tone.  Skin: Skin is warm and dry. She is not diaphoretic.  Flushed cheeks.  Non-tender to palpation without lesions.    A/P: Vaginal exam consistent with uterine prolapse, which would explain the discomfort that she is describing in combination with her dysuria from UTI. Patient with no history of  allergies.  Face burning appears to be related to anxiety from her perineal discomfort.  She was switched to vancomycin and Zosyn today, but she received both of these medication this morning without issue.  Last received Zosyn at 2245.  No itching or trouble breathing.  Burning appeared to improve when cause for her discomfort was explained. -Reassured patient. -Refer to gynecology as outpatient. -Warm compresses to lower abdomen. -Vicodin 5-325 mg q6h PRN.  Arman Filter, MD, PhD Internal Medicine Intern Pager: [phone] 06/20/2014,1:49 AM

## 2014-06-20 NOTE — Interval H&P Note (Signed)
History and Physical Interval Note:  06/20/2014 6:17 AM  Ruth Douglas  has presented today for surgery, with the diagnosis of right transmetatarsal  The various methods of treatment have been discussed with the patient and family. After consideration of risks, benefits and other options for treatment, the patient has consented to  Procedure(s): AMPUTATION FOOT (Right) as a surgical intervention .  The patient's history has been reviewed, patient examined, no change in status, stable for surgery.  I have reviewed the patient's chart and labs.  Questions were answered to the patient's satisfaction.     DUDA,MARCUS V

## 2014-06-21 LAB — BASIC METABOLIC PANEL
ANION GAP: 8 (ref 5–15)
BUN: 16 mg/dL (ref 6–23)
CO2: 26 mmol/L (ref 19–32)
Calcium: 8.7 mg/dL (ref 8.4–10.5)
Chloride: 103 mmol/L (ref 96–112)
Creatinine, Ser: 1.64 mg/dL — ABNORMAL HIGH (ref 0.50–1.10)
GFR calc Af Amer: 35 mL/min — ABNORMAL LOW (ref >90)
GFR calc non Af Amer: 30 mL/min — ABNORMAL LOW (ref >90)
Glucose, Bld: 210 mg/dL — ABNORMAL HIGH (ref 70–99)
Potassium: 4.5 mmol/L (ref 3.5–5.1)
Sodium: 137 mmol/L (ref 135–145)

## 2014-06-21 LAB — GLUCOSE, CAPILLARY
GLUCOSE-CAPILLARY: 231 mg/dL — AB (ref 70–99)
Glucose-Capillary: 141 mg/dL — ABNORMAL HIGH (ref 70–99)
Glucose-Capillary: 211 mg/dL — ABNORMAL HIGH (ref 70–99)
Glucose-Capillary: 184 mg/dL — ABNORMAL HIGH (ref 70–99)
Glucose-Capillary: 245 mg/dL — ABNORMAL HIGH (ref 70–99)

## 2014-06-21 LAB — CBC
HCT: 29.1 % — ABNORMAL LOW (ref 36.0–46.0)
Hemoglobin: 9.3 g/dL — ABNORMAL LOW (ref 12.0–15.0)
MCH: 27 pg (ref 26.0–34.0)
MCHC: 32 g/dL (ref 30.0–36.0)
MCV: 84.3 fL (ref 78.0–100.0)
PLATELETS: 295 10*3/uL (ref 150–400)
RBC: 3.45 MIL/uL — AB (ref 3.87–5.11)
RDW: 13.2 % (ref 11.5–15.5)
WBC: 6.1 10*3/uL (ref 4.0–10.5)

## 2014-06-21 MED ORDER — INSULIN ASPART 100 UNIT/ML ~~LOC~~ SOLN
0.0000 [IU] | Freq: Three times a day (TID) | SUBCUTANEOUS | Status: DC
  Administered 2014-06-22: 4 [IU] via SUBCUTANEOUS
  Administered 2014-06-22: 3 [IU] via SUBCUTANEOUS
  Administered 2014-06-22: 4 [IU] via SUBCUTANEOUS
  Administered 2014-06-23: 7 [IU] via SUBCUTANEOUS

## 2014-06-21 MED ORDER — SENNA 8.6 MG PO TABS
2.0000 | ORAL_TABLET | Freq: Once | ORAL | Status: AC
  Administered 2014-06-21: 17.2 mg via ORAL
  Filled 2014-06-21 (×2): qty 2

## 2014-06-21 MED ORDER — PIPERACILLIN-TAZOBACTAM 3.375 G IVPB
3.3750 g | Freq: Three times a day (TID) | INTRAVENOUS | Status: AC
  Administered 2014-06-21 – 2014-06-22 (×5): 3.375 g via INTRAVENOUS
  Filled 2014-06-21 (×6): qty 50

## 2014-06-21 MED ORDER — DOCUSATE SODIUM 100 MG PO CAPS
100.0000 mg | ORAL_CAPSULE | Freq: Two times a day (BID) | ORAL | Status: DC
  Administered 2014-06-21 – 2014-06-22 (×3): 100 mg via ORAL
  Filled 2014-06-21 (×7): qty 1

## 2014-06-21 MED ORDER — INSULIN GLARGINE 100 UNIT/ML ~~LOC~~ SOLN
24.0000 [IU] | Freq: Every day | SUBCUTANEOUS | Status: DC
  Administered 2014-06-21: 24 [IU] via SUBCUTANEOUS
  Filled 2014-06-21 (×2): qty 0.24

## 2014-06-21 NOTE — Evaluation (Signed)
Physical Therapy Evaluation Patient Details Name: Ruth Douglas MRN: [id number] DOB: [birth year] Today's Date: 06/21/2014   History of Present Illness  S/p right transmetatarsal amputation  Clinical Impression  Patient is s/p above surgery resulting in functional limitations due to the deficits listed below (see PT Problem List).  Patient will benefit from skilled PT to increase their independence and safety with mobility to allow discharge to the venue listed below.       Follow Up Recommendations Home health PT;Supervision/Assistance - 24 hour    Equipment Recommendations  None recommended by PT    Recommendations for Other Services       Precautions / Restrictions Precautions Precautions: Fall Precaution Comments: Decreased sensation in bilateral feet - R>L. Ulcer is on R side.  Restrictions Weight Bearing Restrictions: Yes RLE Weight Bearing: Non weight bearing      Mobility  Bed Mobility Overal bed mobility: Modified Independent Bed Mobility: Supine to Sit;Sit to Supine     Supine to sit: Modified independent (Device/Increase time) Sit to supine: Modified independent (Device/Increase time)      Transfers Overall transfer level: Needs assistance Equipment used: Rolling walker (2 wheeled) Transfers: Sit to/from Stand Sit to Stand: Min guard         General transfer comment: Minguard for safety with cues for hand placement.   Ambulation/Gait Ambulation/Gait assistance: Min guard Ambulation Distance (Feet): 20 Feet Assistive device: Rolling walker (2 wheeled) Gait Pattern/deviations: Step-to pattern;Antalgic Gait velocity: Decreased      Stairs            Wheelchair Mobility    Modified Rankin (Stroke Patients Only)       Balance           Standing balance support: Bilateral upper extremity supported Standing balance-Leahy Scale: Fair                               Pertinent Vitals/Pain Pain Assessment: 0-10 Pain  Score: 4  Pain Location: right LE  Pain Descriptors / Indicators: Pounding Pain Intervention(s): Premedicated before session;Repositioned    Home Living Family/patient expects to be discharged to:: Private residence Living Arrangements: Spouse/significant other Available Help at Discharge: Family;Available 24 hours/day Type of Home: House Home Access: Stairs to enter Entrance Stairs-Rails: Right Entrance Stairs-Number of Steps: 2 Home Layout: One level Home Equipment: Walker - 2 wheels;Cane - single point;Wheelchair - manual      Prior Function Level of Independence: Independent         Comments: No longer drives. Community ambulator     Hand Dominance   Dominant Hand: Right    Extremity/Trunk Assessment               Lower Extremity Assessment: Generalized weakness         Communication   Communication: No difficulties  Cognition Arousal/Alertness: Awake/alert Behavior During Therapy: WFL for tasks assessed/performed Overall Cognitive Status: Within Functional Limits for tasks assessed                      General Comments      Exercises        Assessment/Plan    PT Assessment Patient needs continued PT services  PT Diagnosis Difficulty walking;Generalized weakness   PT Problem List Decreased strength;Decreased range of motion;Decreased activity tolerance;Decreased balance;Decreased mobility;Decreased knowledge of use of DME;Decreased safety awareness;Decreased knowledge of precautions;Decreased skin integrity  PT Treatment Interventions DME instruction;Gait training;Stair training;Functional mobility training;Therapeutic  activities;Therapeutic exercise;Neuromuscular re-education;Patient/family education   PT Goals (Current goals can be found in the Care Plan section) Acute Rehab PT Goals Patient Stated Goal: To get better and go home PT Goal Formulation: With patient/family Time For Goal Achievement: 06/28/14 Potential to Achieve Goals:  Good    Frequency Min 3X/week   Barriers to discharge        Co-evaluation               End of Session Equipment Utilized During Treatment: Gait belt Activity Tolerance: Patient tolerated treatment well Patient left: in chair;with call bell/phone within reach Nurse Communication: Mobility status         Time: 0940-1010 PT Time Calculation (min) (ACUTE ONLY): 30 min   Charges:   PT Evaluation $Initial PT Evaluation Tier I: 1 Procedure PT Treatments $Therapeutic Activity: 8-22 mins   PT G Codes:        Lakyn Mantione 07-10-2014, 11:43 AM  Antoine Poche, PT DPT [phone]

## 2014-06-21 NOTE — Progress Notes (Signed)
Subjective:  S/p right transmetatarsal amputation yesterday.  No complaints today except for some constipation stating she hasn't had a BM since her admission which is not normal for her.    Objective: Vital signs in last 24 hours: Filed Vitals:   06/20/14 2056 06/21/14 0157 06/21/14 0522 06/21/14 0529  BP: 142/69 129/52 150/61   Pulse:  81    Temp: 98.1 F (36.7 C) 98.7 F (37.1 C) 100.4 F (38 C)   TempSrc: Oral Oral Oral   Resp: _0 Height:      Weight:    67.9 kg (149 lb 11.1 oz)  SpO2: 100% 97% 97%    Weight change: 5.7 kg (12 lb 9.1 oz)  Intake/Output Summary (Last 24 hours) at 06/21/14 1013 Last data filed at 06/20/14 1842  Gross per 24 hour  Intake    400 ml  Output      0 ml  Net    400 ml   Gen: A&O x 4, No acute distress, well developed, well nourished; sitting up in the chair  HEENT: Atraumatic, EOMI, sclerae anicteric, moist mucous membranes Heart: Regular rate and rhythm, normal S1 S2, no murmurs, rubs, or gallops Lungs: Clear to auscultation bilaterally, respirations unlabored Abd: Soft, non-tender including suprapubic, no CVA tenderness, non-distended, + bowel sounds, no hepatosplenomegaly Ext: L foot s/p amputation of all digits, R foot bandaged after transmetatarsal; strong DP pulses.  Lab Results: Basic Metabolic Panel:  Recent Labs Lab 06/20/14 0751 06/21/14 0556  NA 134* 137  K 4.6 4.5  CL 99 103  CO2 26 26  GLUCOSE 234* 210*  BUN 26* 16  CREATININE 1.99* 1.64*  CALCIUM 8.8 8.7   Liver Function Tests:  Recent Labs Lab 06/19/14 0820  AST 14  ALT 12  ALKPHOS 66  BILITOT 0.5  PROT 6.9  ALBUMIN 3.0*   CBC:  Recent Labs Lab 06/18/14 1615 06/19/14 0820 06/20/14 0751 06/21/14 0556  WBC 6.4 4.8 4.1 6.1  NEUTROABS 4.8 2.6  --   --   HGB 10.7* 9.7* 9.9* 9.3*  HCT 32.5* 29.1* 30.4* 29.1*  MCV 81.7 82.7 82.6 84.3  PLT 345 315 301 295   CBG:  Recent Labs Lab 06/19/14 1810 06/19/14 2138 06/20/14 0756 06/20/14 1643  06/20/14 2056 06/21/14 0821  GLUCAP 386* 225* 234* 175* 141* 211*   Anemia Panel:  Recent Labs Lab 06/19/14 0820  VITAMINB12 628  FOLATE >20.0  FERRITIN 113  TIBC 249*  IRON 35*  RETICCTPCT 0.9   Urinalysis:  Recent Labs Lab 06/18/14 1620  COLORURINE YELLOW  LABSPEC 1.017  PHURINE 6.0  GLUCOSEU 100*  HGBUR LARGE*  BILIRUBINUR NEGATIVE  KETONESUR NEGATIVE  PROTEINUR 100*  UROBILINOGEN 0.2  NITRITE NEGATIVE  LEUKOCYTESUR LARGE*  few squam, WBC too numerous to count, 0-2 RBC, few bacteria  Misc. Labs: Lactic acid 2.04-->1.12, 1.38 CRP 6.5 ESR 119  Micro Results: Recent Results (from the past 240 hour(s))  Blood culture (routine x 2)     Status: None (Preliminary result)   Collection Time: 06/18/14  4:15 PM  Result Value Ref Range Status   Specimen Description BLOOD ARM LEFT  Final   Special Requests BOTTLES DRAWN AEROBIC AND ANAEROBIC 5CC  Final   Culture   Final           BLOOD CULTURE RECEIVED NO GROWTH TO DATE CULTURE WILL BE HELD FOR 5 DAYS BEFORE ISSUING A FINAL NEGATIVE REPORT Performed at [facility]    Report  Status PENDING  Incomplete  Urine culture     Status: None   Collection Time: 06/18/14  4:20 PM  Result Value Ref Range Status   Specimen Description URINE, RANDOM  Final   Special Requests bactrim  Final   Colony Count   Final    50,000 COLONIES/ML Performed at [facility]    Culture   Final    Multiple bacterial morphotypes present, none predominant. Suggest appropriate recollection if clinically indicated. Performed at [facility]    Report Status 06/20/2014 FINAL  Final  Blood culture (routine x 2)     Status: None (Preliminary result)   Collection Time: 06/18/14  4:24 PM  Result Value Ref Range Status   Specimen Description BLOOD ARM RIGHT  Final   Special Requests BOTTLES DRAWN AEROBIC AND ANAEROBIC 5CC  Final   Culture   Final           BLOOD CULTURE RECEIVED NO GROWTH TO DATE CULTURE WILL BE HELD FOR  5 DAYS BEFORE ISSUING A FINAL NEGATIVE REPORT Performed at [facility]    Report Status PENDING  Incomplete  Surgical pcr screen     Status: Abnormal   Collection Time: 06/19/14  9:36 PM  Result Value Ref Range Status   MRSA, PCR NEGATIVE NEGATIVE Final   Staphylococcus aureus POSITIVE (A) NEGATIVE Final    Comment:        The Xpert SA Assay (FDA approved for NASAL specimens in patients over 41 years of age), is one component of a comprehensive surveillance program.  Test performance has been validated by [facility] for patients greater than or equal to 73 year old. It is not intended to diagnose infection nor to guide or monitor treatment.    Studies/Results: US Renal  06/19/2014   CLINICAL DATA:  Acute renal insufficiency  EXAM: RENAL/URINARY TRACT ULTRASOUND COMPLETE  COMPARISON:  CT scan 11/20/2012  FINDINGS: Right Kidney:  Length: 10.3 cm. Relatively normal renal cortical thickness. Lobulated borders could be due to feel lobulation or prior scarring. Normal renal cortical echogenicity. Small upper pole cysts are noted. No hydronephrosis.  Left Kidney:  Length: 10.4 cm. Normal echogenicity and mild renal cortical thinning. No hydronephrosis. A lower pole renal calculus is noted.  Bladder:  Grossly normal but poorly distended.  IMPRESSION: Upper pole right renal cysts and lower pole left renal calculus.  No hydronephrosis.   Electronically Signed   By: Marijo Sanes M.D.   On: 06/19/2014 18:38   Medications: I have reviewed the patient's current medications. Scheduled Meds: . amLODipine  5 mg Oral Daily  . Chlorhexidine Gluconate Cloth  6 each Topical Daily  . feeding supplement (GLUCERNA SHAKE)  237 mL Oral BID BM  . ferrous sulfate  325 mg Oral Q breakfast  . heparin  5,000 Units Subcutaneous 3 times per day  . insulin aspart  0-15 Units Subcutaneous TID WC  . insulin glargine  24 Units Subcutaneous QHS  . multivitamin with minerals  1 tablet Oral Daily  .  mupirocin ointment  1 application Nasal BID  . piperacillin-tazobactam (ZOSYN)  IV  2.25 g Intravenous 3 times per day  . vancomycin  750 mg Intravenous Q24H   Continuous Infusions: . sodium chloride 75 mL/hr at 06/21/14 0730  . lactated ringers 10 mL/hr at 06/20/14 1724   PRN Meds:. 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 Sharol Given. VSS.  No complaints.   -appreciate ortho -cont vanc/zosyn until 4/24 (48h post-op)  -d/c NS. -norco 5-325 q6hprn -Follow-up final blood culture x 2 -PT/OT  UTI with dysuria Pt c/o dysuria and pressure with urine leakage.  Likely d/t stress incontinence.   -abx per above -f/u with gynecology outpatient   Acute on chronic kidney disease SCr trending down to 1.64 today which is close to her baseline.   -avoid nephrotoxins -control risk factors including BP, DMII  -cont to monitor  DMII -f/u HA1c pending  -increase lantus to 24 units qhs given fasting cbgs (she is only on 10 units of lantus at home)  -SSI-m   Recent Labs  06/20/14 2056 06/21/14 0821 06/21/14 1217  GLUCAP 141* 211* 184*   Anemia Stable.   -cont ferrous sulfate 325 mg po qd -cbc in AM   HTN -cont amlodipine 46m qd  DVT prophylaxis -Heparin.  Constipaton -senokot PRN  -add colace with ferrous sulfate  Dispo: Disposition is deferred at this time, awaiting improvement of current medical problems.  Anticipated discharge by ortho.   The patient does not know have a current PCP (No Pcp Per Patient) and [facility]    LOS: 3 days   JJones Bales MD 06/21/2014, 10:13 AM

## 2014-06-22 LAB — GLUCOSE, CAPILLARY
GLUCOSE-CAPILLARY: 184 mg/dL — AB (ref 70–99)
GLUCOSE-CAPILLARY: 169 mg/dL — AB (ref 70–99)
Glucose-Capillary: 164 mg/dL — ABNORMAL HIGH (ref 70–99)
Glucose-Capillary: 105 mg/dL — ABNORMAL HIGH (ref 70–99)

## 2014-06-22 MED ORDER — INSULIN ASPART 100 UNIT/ML ~~LOC~~ SOLN
0.0000 [IU] | Freq: Three times a day (TID) | SUBCUTANEOUS | Status: DC
Start: 2014-06-22 — End: 2014-06-22

## 2014-06-22 MED ORDER — INSULIN GLARGINE 100 UNIT/ML ~~LOC~~ SOLN
27.0000 [IU] | Freq: Every day | SUBCUTANEOUS | Status: DC
  Administered 2014-06-22: 27 [IU] via SUBCUTANEOUS
  Filled 2014-06-22 (×2): qty 0.27

## 2014-06-22 MED ORDER — INSULIN ASPART 100 UNIT/ML ~~LOC~~ SOLN
3.0000 [IU] | Freq: Three times a day (TID) | SUBCUTANEOUS | Status: DC
  Administered 2014-06-22 – 2014-06-23 (×4): 3 [IU] via SUBCUTANEOUS

## 2014-06-22 MED ORDER — INSULIN ASPART 100 UNIT/ML ~~LOC~~ SOLN: 0.0000 [IU] | Freq: Every day | SUBCUTANEOUS | Status: DC

## 2014-06-22 NOTE — Progress Notes (Signed)
Subjective:  Ms Demeritt complains of no pain from being s/p right transmetatarsal amputation 4/22.  She says she was constipated yesterday but had several bowel movements later in the day. She still has some urinary incontinence with discomfort.  Objective: Vital signs in last 24 hours: Filed Vitals:   06/21/14 0529 06/21/14 1636 06/21/14 2150 06/22/14 0604  BP:  145/55 140/60 155/71  Pulse:  99 83 79  Temp:  99.1 F (37.3 C) 98.3 F (36.8 C) 97.9 F (36.6 C)  TempSrc:  Oral Oral Oral  Resp:  '18 20 15  ' Height:      Weight: 149 lb 11.1 oz (67.9 kg)     SpO2:  99% 97% 99%   Weight change:   Intake/Output Summary (Last 24 hours) at 06/22/14 1218 Last data filed at 06/22/14 1962  Gross per 24 hour  Intake    590 ml  Output      0 ml  Net    590 ml   Gen: A&O x 4, No acute distress, well developed, well nourished; laying in bed  HEENT: Atraumatic, EOMI, sclerae anicteric, moist mucous membranes Heart: Regular rate and rhythm, normal S1 S2, no murmurs, rubs, or gallops Lungs: Clear to auscultation bilaterally, respirations unlabored Abd: Soft, non-tender including suprapubic, no CVA tenderness, non-distended, + bowel sounds, no hepatosplenomegaly Ext: L foot s/p amputation of all digits with strong DP pulse, well healed, R foot well bandaged  Lab Results: Basic Metabolic Panel:  Recent Labs Lab 06/20/14 0751 06/21/14 0556  NA 134* 137  K 4.6 4.5  CL 99 103  CO2 26 26  GLUCOSE 234* 210*  BUN 26* 16  CREATININE 1.99* 1.64*  CALCIUM 8.8 8.7   Liver Function Tests:  Recent Labs Lab 06/19/14 0820  AST 14  ALT 12  ALKPHOS 66  BILITOT 0.5  PROT 6.9  ALBUMIN 3.0*   CBC:  Recent Labs Lab 06/18/14 1615 06/19/14 0820 06/20/14 0751 06/21/14 0556  WBC 6.4 4.8 4.1 6.1  NEUTROABS 4.8 2.6  --   --   HGB 10.7* 9.7* 9.9* 9.3*  HCT 32.5* 29.1* 30.4* 29.1*  MCV 81.7 82.7 82.6 84.3  PLT 345 315 301 295   CBG:  Recent Labs Lab 06/21/14 0821 06/21/14 1217  06/21/14 1732 06/21/14 2149 06/22/14 0812 06/22/14 1205  GLUCAP 211* 184* 231* 245* 184* 169*   Anemia Panel:  Recent Labs Lab 06/19/14 0820  VITAMINB12 628  FOLATE >20.0  FERRITIN 113  TIBC 249*  IRON 35*  RETICCTPCT 0.9   Urinalysis:  Recent Labs Lab 06/18/14 1620  COLORURINE YELLOW  LABSPEC 1.017  PHURINE 6.0  GLUCOSEU 100*  HGBUR LARGE*  BILIRUBINUR NEGATIVE  KETONESUR NEGATIVE  PROTEINUR 100*  UROBILINOGEN 0.2  NITRITE NEGATIVE  LEUKOCYTESUR LARGE*  few squam, WBC too numerous to count, 0-2 RBC, few bacteria  Misc. Labs: Lactic acid 2.04-->1.12, 1.38 CRP 6.5 ESR 119  Micro Results: Recent Results (from the past 240 hour(s))  Blood culture (routine x 2)     Status: None (Preliminary result)   Collection Time: 06/18/14  4:15 PM  Result Value Ref Range Status   Specimen Description BLOOD ARM LEFT  Final   Special Requests BOTTLES DRAWN AEROBIC AND ANAEROBIC 5CC  Final   Culture   Final           BLOOD CULTURE RECEIVED NO GROWTH TO DATE CULTURE WILL BE HELD FOR 5 DAYS BEFORE ISSUING A FINAL NEGATIVE REPORT Performed at [facility]  Report Status PENDING  Incomplete  Urine culture     Status: None   Collection Time: 06/18/14  4:20 PM  Result Value Ref Range Status   Specimen Description URINE, RANDOM  Final   Special Requests bactrim  Final   Colony Count   Final    50,000 COLONIES/ML Performed at [facility]    Culture   Final    Multiple bacterial morphotypes present, none predominant. Suggest appropriate recollection if clinically indicated. Performed at [facility]    Report Status 06/20/2014 FINAL  Final  Blood culture (routine x 2)     Status: None (Preliminary result)   Collection Time: 06/18/14  4:24 PM  Result Value Ref Range Status   Specimen Description BLOOD ARM RIGHT  Final   Special Requests BOTTLES DRAWN AEROBIC AND ANAEROBIC 5CC  Final   Culture   Final           BLOOD CULTURE RECEIVED NO GROWTH TO  DATE CULTURE WILL BE HELD FOR 5 DAYS BEFORE ISSUING A FINAL NEGATIVE REPORT Performed at [facility]    Report Status PENDING  Incomplete  Surgical pcr screen     Status: Abnormal   Collection Time: 06/19/14  9:36 PM  Result Value Ref Range Status   MRSA, PCR NEGATIVE NEGATIVE Final   Staphylococcus aureus POSITIVE (A) NEGATIVE Final    Comment:        The Xpert SA Assay (FDA approved for NASAL specimens in patients over 43 years of age), is one component of a comprehensive surveillance program.  Test performance has been validated by [facility] for patients greater than or equal to 31 year old. It is not intended to diagnose infection nor to guide or monitor treatment.    Studies/Results: No results found. Medications: I have reviewed the patient's current medications. Scheduled Meds: . amLODipine  5 mg Oral Daily  . Chlorhexidine Gluconate Cloth  6 each Topical Daily  . docusate sodium  100 mg Oral BID  . feeding supplement (GLUCERNA SHAKE)  237 mL Oral BID BM  . ferrous sulfate  325 mg Oral Q breakfast  . heparin  5,000 Units Subcutaneous 3 times per day  . insulin aspart  0-20 Units Subcutaneous TID WC  . insulin aspart  0-5 Units Subcutaneous QHS  . insulin aspart  3 Units Subcutaneous TID WC  . insulin glargine  27 Units Subcutaneous QHS  . multivitamin with minerals  1 tablet Oral Daily  . mupirocin ointment  1 application Nasal BID  . piperacillin-tazobactam (ZOSYN)  IV  3.375 g Intravenous 3 times per day  . vancomycin  750 mg Intravenous Q24H   Continuous Infusions:   PRN Meds:. 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 Sharol Given. VSS.  No  complaints.   -appreciate ortho -cont vanc/zosyn through 4/24 (48h post-op)  -tylenol 650 mg po po or pr q6hprn -Follow-up final blood culture x 2 (NGTD) -PT/OT recommend home health  UTI with dysuria Pt c/o dysuria and pressure with urine leakage.  Likely d/t stress incontinence. Vaginal exam on 4/23 (chaperoned by RN and w Dr Gordy Levan) noted no obvious prolapse other than vaginal walls -abx per above -f/u with  gynecology outpatient   Acute on chronic kidney disease SCr trending down to 1.64 yesterday which is close to her baseline.   -avoid nephrotoxins -control risk factors including BP, DMII  -cont to monitor  DMII AB CBG 184 on lantus 27 u  -f/u HA1c pending  -increase lantus to 27 units qhs given fasting cbgs (she is only on 10 units of lantus at home)  -increase SSI to resistant   Recent Labs  06/21/14 2149 06/22/14 0812 06/22/14 1205  GLUCAP 245* 184* 169*   Anemia Stable.   -cont ferrous sulfate 325 mg po qd -cbc in AM   HTN BP this am 155/71 -cont amlodipine 77m qd  DVT prophylaxis -Heparin.  Constipaton Resolved yesterday with several bowel movements -colace 100 mg bid  Dispo: Disposition is deferred at this time, awaiting improvement of current medical problems.  Anticipated discharge by ortho.   The patient does not know have a current PCP (No Pcp Per Patient) and [facility]    LOS: 4 days   AKelby Aline MD 06/22/2014, 12:18 PM

## 2014-06-22 NOTE — Progress Notes (Signed)
Orthopedic Tech Progress Note Patient Details:  Ruth Douglas 09/16/1940 [id number]  Ortho Devices Type of Ortho Device: Postop shoe/boot Ortho Device/Splint Location: RLE Ortho Device/Splint Interventions: Application   Asia R Thompson 06/22/2014, 8:02 AM

## 2014-06-22 NOTE — Progress Notes (Signed)
ANTIBIOTIC CONSULT NOTE - FOLLOW UP  Pharmacy Consult for vancomycin/zosyn Indication: foot ulcer  No Known Allergies  Patient Measurements: Height: 5\' 3"  (160 cm) Weight: 149 lb 11.1 oz (67.9 kg) IBW/kg (Calculated) : 52.4   Vital Signs: Temp: 97.9 F (36.6 C) (04/24 0604) Temp Source: Oral (04/24 0604) BP: 155/71 mmHg (04/24 0604) Pulse Rate: 79 (04/24 0604) Intake/Output from previous day: 04/23 0701 - 04/24 0700 In: 590 [P.O.:240; IV Piggyback:350] Out: -  Intake/Output from this shift:    Labs:  Recent Labs  06/20/14 0751 06/21/14 0556  WBC 4.1 6.1  HGB 9.9* 9.3*  PLT 301 295  CREATININE 1.99* 1.64*   Estimated Creatinine Clearance: 29.1 mL/min (by C-G formula based on Cr of 1.64). No results for input(s): VANCOTROUGH, VANCOPEAK, VANCORANDOM, GENTTROUGH, GENTPEAK, GENTRANDOM, TOBRATROUGH, TOBRAPEAK, TOBRARND, AMIKACINPEAK, AMIKACINTROU, AMIKACIN in the last 72 hours.   Assessment: 52 YOF with hx of diabetes, on vancomycin/zosyn for foot ulcer. 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 S/p transmetatarsal amputation 4/22. Per ortho to continue abx for 48 hrs post-op. Afeb, wbc wnl. Pt. with AoCKD, SCr elevated to 2.6 on admit (baseline ~1 in 2014). Down to 1.64 with hydration. est CrCL ~18mL/min.  CTX 4/21 x1 Flagyl 4/21 x2 Zosyn 4/21>> Vanc 4/21>>  4/20 urine: mult. bact 4/20 BCx: NGTD  Goal of Therapy:  Vancomycin trough level 10-15 mcg/ml  Plan:  - Add stop date for vanc/zosyn through today - No level indicated - Pharmacy sign off.  Thanks!  Maryanna Shape, PharmD, BCPS  Clinical Pharmacist  Pager: [phone]   06/22/2014,1:02 PM

## 2014-06-23 LAB — BASIC METABOLIC PANEL
ANION GAP: 10 (ref 5–15)
BUN: 12 mg/dL (ref 6–23)
CHLORIDE: 99 mmol/L (ref 96–112)
CO2: 26 mmol/L (ref 19–32)
Calcium: 8.6 mg/dL (ref 8.4–10.5)
Creatinine, Ser: 1.58 mg/dL — ABNORMAL HIGH (ref 0.50–1.10)
GFR calc Af Amer: 37 mL/min — ABNORMAL LOW (ref >90)
GFR, EST NON AFRICAN AMERICAN: 32 mL/min — AB (ref >90)
Glucose, Bld: 74 mg/dL (ref 70–99)
Potassium: 3.7 mmol/L (ref 3.5–5.1)
SODIUM: 135 mmol/L (ref 135–145)

## 2014-06-23 LAB — CBC
HCT: 29.5 % — ABNORMAL LOW (ref 36.0–46.0)
Hemoglobin: 9.3 g/dL — ABNORMAL LOW (ref 12.0–15.0)
MCH: 26.9 pg (ref 26.0–34.0)
MCHC: 31.5 g/dL (ref 30.0–36.0)
MCV: 85.3 fL (ref 78.0–100.0)
Platelets: 283 10*3/uL (ref 150–400)
RBC: 3.46 MIL/uL — ABNORMAL LOW (ref 3.87–5.11)
RDW: 13.4 % (ref 11.5–15.5)
WBC: 5.8 10*3/uL (ref 4.0–10.5)

## 2014-06-23 LAB — GLUCOSE, CAPILLARY
GLUCOSE-CAPILLARY: 84 mg/dL (ref 70–99)
Glucose-Capillary: 153 mg/dL — ABNORMAL HIGH (ref 70–99)
Glucose-Capillary: 242 mg/dL — ABNORMAL HIGH (ref 70–99)

## 2014-06-23 MED ORDER — INSULIN GLARGINE 100 UNIT/ML ~~LOC~~ SOLN: 24.0000 [IU] | Freq: Every day | SUBCUTANEOUS | Status: DC

## 2014-06-23 MED ORDER — AMLODIPINE BESYLATE 5 MG PO TABS: 5.0000 mg | ORAL_TABLET | Freq: Every day | ORAL | Status: DC

## 2014-06-23 NOTE — Progress Notes (Signed)
Subjective:  I spoke to Dr Sharol Given who said the patient does not require any additional antibiotics and she will be seen later this week in his clinic. Ruth Douglas still has complaint of no pain from being s/p right transmetatarsal amputation 4/22 and so does not require analgesic on discharge.  She says her dysuria is improved.  Objective: Vital signs in last 24 hours: Filed Vitals:   06/22/14 2141 06/23/14 0542 06/23/14 0937 06/23/14 0937  BP: 156/63 133/58 127/48 127/48  Pulse: 83     Temp: 98.6 F (37 C) 98 F (36.7 C)    TempSrc: Oral Oral    Resp: 17 15    Height:      Weight:  148 lb 13 oz (67.5 kg)    SpO2: 98% 98%     Weight change:   Intake/Output Summary (Last 24 hours) at 06/23/14 1310 Last data filed at 06/23/14 1007  Gross per 24 hour  Intake    290 ml  Output      0 ml  Net    290 ml   Gen: A&O x 4, No acute distress, well developed, well nourished; laying in bed  HEENT: Atraumatic, EOMI, sclerae anicteric, moist mucous membranes Heart: Regular rate and rhythm, normal S1 S2, no murmurs, rubs, or gallops Lungs: Clear to auscultation bilaterally, respirations unlabored Abd: Soft, non-tender, no CVA tenderness, non-distended, + bowel sounds, no hepatosplenomegaly Ext: L foot s/p amputation of all digits with strong DP pulse, well healed, R foot well bandaged  Lab Results: Basic Metabolic Panel:  Recent Labs Lab 06/21/14 0556 06/23/14 0715  NA 137 135  K 4.5 3.7  CL 103 99  CO2 26 26  GLUCOSE 210* 74  BUN 16 12  CREATININE 1.64* 1.58*  CALCIUM 8.7 8.6   Liver Function Tests:  Recent Labs Lab 06/19/14 0820  AST 14  ALT 12  ALKPHOS 66  BILITOT 0.5  PROT 6.9  ALBUMIN 3.0*   CBC:  Recent Labs Lab 06/18/14 1615 06/19/14 0820  06/21/14 0556 06/23/14 0715  WBC 6.4 4.8  < > 6.1 5.8  NEUTROABS 4.8 2.6  --   --   --   HGB 10.7* 9.7*  < > 9.3* 9.3*  HCT 32.5* 29.1*  < > 29.1* 29.5*  MCV 81.7 82.7  < > 84.3 85.3  PLT 345 315  < > 295 283  <  > = values in this interval not displayed. CBG:  Recent Labs Lab 06/22/14 0812 06/22/14 1205 06/22/14 1730 06/22/14 2139 06/23/14 0812 06/23/14 1213  GLUCAP 184* 169* 164* 105* 84 242*   Anemia Panel:  Recent Labs Lab 06/19/14 0820  VITAMINB12 628  FOLATE >20.0  FERRITIN 113  TIBC 249*  IRON 35*  RETICCTPCT 0.9   Urinalysis:  Recent Labs Lab 06/18/14 1620  COLORURINE YELLOW  LABSPEC 1.017  PHURINE 6.0  GLUCOSEU 100*  HGBUR LARGE*  BILIRUBINUR NEGATIVE  KETONESUR NEGATIVE  PROTEINUR 100*  UROBILINOGEN 0.2  NITRITE NEGATIVE  LEUKOCYTESUR LARGE*  few squam, WBC too numerous to count, 0-2 RBC, few bacteria  Misc. Labs: Lactic acid 2.04-->1.12, 1.38 CRP 6.5 ESR 119  Micro Results: Recent Results (from the past 240 hour(s))  Blood culture (routine x 2)     Status: None (Preliminary result)   Collection Time: 06/18/14  4:15 PM  Result Value Ref Range Status   Specimen Description BLOOD ARM LEFT  Final   Special Requests BOTTLES DRAWN AEROBIC AND ANAEROBIC 5CC  Final  Culture   Final           BLOOD CULTURE RECEIVED NO GROWTH TO DATE CULTURE WILL BE HELD FOR 5 DAYS BEFORE ISSUING A FINAL NEGATIVE REPORT Performed at [facility]    Report Status PENDING  Incomplete  Urine culture     Status: None   Collection Time: 06/18/14  4:20 PM  Result Value Ref Range Status   Specimen Description URINE, RANDOM  Final   Special Requests bactrim  Final   Colony Count   Final    50,000 COLONIES/ML Performed at [facility]    Culture   Final    Multiple bacterial morphotypes present, none predominant. Suggest appropriate recollection if clinically indicated. Performed at [facility]    Report Status 06/20/2014 FINAL  Final  Blood culture (routine x 2)     Status: None (Preliminary result)   Collection Time: 06/18/14  4:24 PM  Result Value Ref Range Status   Specimen Description BLOOD ARM RIGHT  Final   Special Requests BOTTLES DRAWN  AEROBIC AND ANAEROBIC 5CC  Final   Culture   Final           BLOOD CULTURE RECEIVED NO GROWTH TO DATE CULTURE WILL BE HELD FOR 5 DAYS BEFORE ISSUING A FINAL NEGATIVE REPORT Performed at [facility]    Report Status PENDING  Incomplete  Surgical pcr screen     Status: Abnormal   Collection Time: 06/19/14  9:36 PM  Result Value Ref Range Status   MRSA, PCR NEGATIVE NEGATIVE Final   Staphylococcus aureus POSITIVE (A) NEGATIVE Final    Comment:        The Xpert SA Assay (FDA approved for NASAL specimens in patients over 34 years of age), is one component of a comprehensive surveillance program.  Test performance has been validated by [facility] for patients greater than or equal to 65 year old. It is not intended to diagnose infection nor to guide or monitor treatment.    Studies/Results: No results found. Medications: I have reviewed the patient's current medications. Scheduled Meds: . amLODipine  5 mg Oral Daily  . Chlorhexidine Gluconate Cloth  6 each Topical Daily  . docusate sodium  100 mg Oral BID  . feeding supplement (GLUCERNA SHAKE)  237 mL Oral BID BM  . ferrous sulfate  325 mg Oral Q breakfast  . heparin  5,000 Units Subcutaneous 3 times per day  . insulin aspart  0-20 Units Subcutaneous TID WC  . insulin aspart  3 Units Subcutaneous TID WC  . insulin glargine  27 Units Subcutaneous QHS  . multivitamin with minerals  1 tablet Oral Daily  . mupirocin ointment  1 application Nasal BID   Continuous Infusions:   PRN Meds:. 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 Sharol Given. VSS.  No complaints.   -appreciate ortho -d/c vanc/zosyn  -tylenol 650 mg po  po or pr q6hprn -Follow-up final blood culture x 2 (NGTD) -PT/OT recommend home health  UTI with dysuria Pt c/o dysuria and pressure with urine leakage.  Likely d/t stress incontinence. Vaginal exam on 4/23 (chaperoned by RN and w Dr Gordy Levan) noted no obvious prolapse other than  vaginal walls. She says dysuria is improved after 6 days of abx -d/c abx per above -f/u with gynecology outpatient   Acute on chronic kidney disease SCr trending down to 1.58 yesterday which is close to her baseline.   -avoid nephrotoxins -control risk factors including BP, DMII  -cont to monitor  DMII AB CBG 74 on lantus 27 u  -f/u HA1c pending  -decrease lantus to 24 units qhs given fasting cbgs (she is only on 10 units of lantus at home)  -SSI-resistant   Recent Labs  06/22/14 2139 06/23/14 0812 06/23/14 1213  GLUCAP 105* 84 242*   Anemia Stable.   -cont ferrous sulfate 325 mg po qd -cbc in AM   HTN BP this am 120-150s/40s-60s -cont amlodipine 48m qd  DVT prophylaxis -Heparin.  Constipaton Resolved with several bowel movements -colace 100 mg bid  Dispo: Today  The patient does not know have a current PCP (No Pcp Per Patient) and [facility]    LOS: 5 days   AKelby Aline MD 06/23/2014, 1:10 PM

## 2014-06-23 NOTE — Progress Notes (Signed)
Physical Therapy Treatment Patient Details Name: Ruth Douglas MRN: [id number] DOB: 1940-06-09 Today's Date: Jul 14, 2014    History of Present Illness S/p right transmetatarsal amputation. PMH - lt transmet, DM, CHF    PT Comments    Pt making good progress. Ready for dc home with family.  Follow Up Recommendations  Home health PT;Supervision - Intermittent     Equipment Recommendations  None recommended by PT    Recommendations for Other Services       Precautions / Restrictions Precautions Precautions: Fall Restrictions Weight Bearing Restrictions: Yes RLE Weight Bearing: Non weight bearing    Mobility  Bed Mobility Overal bed mobility: Modified Independent       Supine to sit: Modified independent (Device/Increase time) Sit to supine: Modified independent (Device/Increase time)      Transfers Overall transfer level: Modified independent Equipment used: Rolling walker (2 wheeled) Transfers: Sit to/from Stand Sit to Stand: Modified independent (Device/Increase time)            Ambulation/Gait Ambulation/Gait assistance: Min guard Ambulation Distance (Feet): 20 Feet Assistive device: Rolling walker (2 wheeled) Gait Pattern/deviations: Step-to pattern (hop to) Gait velocity: Decreased Gait velocity interpretation: Below normal speed for age/gender General Gait Details: Pt able to maintain NWB on rt. Tires quickly.   Stairs            Wheelchair Mobility    Modified Rankin (Stroke Patients Only)       Balance Overall balance assessment: Needs assistance Sitting-balance support: No upper extremity supported Sitting balance-Leahy Scale: Normal     Standing balance support: Bilateral upper extremity supported Standing balance-Leahy Scale: Poor Standing balance comment: Use of walker due to NWB on rt foot                    Cognition Arousal/Alertness: Awake/alert Behavior During Therapy: WFL for tasks  assessed/performed Overall Cognitive Status: Within Functional Limits for tasks assessed                      Exercises      General Comments        Pertinent Vitals/Pain Pain Assessment: No/denies pain    Home Living                      Prior Function            PT Goals (current goals can now be found in the care plan section) Progress towards PT goals: Progressing toward goals    Frequency  Min 3X/week    PT Plan Current plan remains appropriate    Co-evaluation             End of Session Equipment Utilized During Treatment: Gait belt Activity Tolerance: Patient tolerated treatment well Patient left: in bed;with call bell/phone within reach     Time: 1215-1223 PT Time Calculation (min) (ACUTE ONLY): 8 min  Charges:  $Gait Training: 8-22 mins                    G Codes:      Bettina Warn Jul 14, 2014, 12:40 PM  [employer] PT [phone]

## 2014-06-23 NOTE — Discharge Summary (Signed)
Name: Ruth Douglas MRN: [id number] DOB: 1940/10/09 74 y.o. PCP: No Pcp Per Patient  Date of Admission: 06/18/2014  2:31 PM Date of Discharge: 06/23/2014 Attending Physician: Madilyn Fireman, MD  Discharge Diagnosis: Principal Problem:   Diabetic foot infection Active Problems:   DM2 (diabetes mellitus, type 2)   Diabetic foot ulcer   Acute-on-chronic kidney injury   Anemia   UTI (urinary tract infection)   Essential hypertension   Diabetic osteomyelitis   Septic arthritis of foot  Discharge Medications:   Medication List    STOP taking these medications        atorvastatin 40 MG tablet  Commonly known as:  LIPITOR     metFORMIN 1000 MG tablet  Commonly known as:  GLUCOPHAGE     sulfamethoxazole-trimethoprim 800-160 MG per tablet  Commonly known as:  BACTRIM DS,SEPTRA DS      TAKE these medications        amLODipine 5 MG tablet  Commonly known as:  NORVASC  Take 5 mg by mouth daily.     ferrous sulfate 325 (65 FE) MG tablet  Take 1 tablet (325 mg total) by mouth 2 (two) times daily with a meal.     insulin glargine 100 UNIT/ML injection  Commonly known as:  LANTUS  Inject 0.24 mLs (24 Units total) into the skin at bedtime.        Disposition and follow-up:   Ruth.Ruth Douglas was discharged from [facility] in [place] condition.  At the hospital follow up visit please address:  1.  R foot surgical site, post-operative functionality, referral to gynecology for full pelvic exam, glycemic control  2.  Labs / imaging needed at time of follow-up: CBC, BMP  3.  Pending labs/ test needing follow-up: hemoglobin A1c  Follow-up Appointments: Follow-up Information    Follow up with [facility] On 06/27/2014.   Why:  2:00 pm   Contact information:   [address] [place] [address] [phone]      Follow up with Newt Minion, MD On 06/27/2014.   Specialty:  Orthopedic Surgery   Why:   @ 10:15 am   Contact information:   [place] [id number] [phone]       Follow up with [facility].   Why:  PT, and OT to come to house.   Contact information:   [address] [place] [address] [phone]      Procedures Performed:  US Renal  06/19/2014   CLINICAL DATA:  Acute renal insufficiency  EXAM: RENAL/URINARY TRACT ULTRASOUND COMPLETE  COMPARISON:  CT scan 11/20/2012  FINDINGS: Right Kidney:  Length: 10.3 cm. Relatively normal renal cortical thickness. Lobulated borders could be due to feel lobulation or prior scarring. Normal renal cortical echogenicity. Small upper pole cysts are noted. No hydronephrosis.  Left Kidney:  Length: 10.4 cm. Normal echogenicity and mild renal cortical thinning. No hydronephrosis. A lower pole renal calculus is noted.  Bladder:  Grossly normal but poorly distended.  IMPRESSION: Upper pole right renal cysts and lower pole left renal calculus.  No hydronephrosis.   Electronically Signed   By: Marijo Sanes M.D.   On: 06/19/2014 18:38   Mr Foot Right Wo Contrast  06/19/2014   CLINICAL DATA:  Uncontrolled type 2 diabetes. Nephropathy and neuropathy. Neuropathy. Foot amputation. Osteomyelitis of the foot.  EXAM: MRI OF THE RIGHT FOREFOOT WITHOUT CONTRAST  TECHNIQUE: Multiplanar, multisequence MR imaging was  performed. No intravenous contrast was administered.  COMPARISON:  Radiographs 06/18/2014.  FINDINGS: Osteomyelitis of the distal third metatarsal is present. There is an ulcer tracking from the plantar aspect of the third metatarsal head. The phalanges of the third toe or dislocated, better demonstrated on prior plain films 06/18/2014. The ulcer tunnel is to the third metatarsal head and there is a small abscess along the dorsal third metatarsal head measuring 16 mm x 12 mm by 23 mm plantar to dorsal. The abscess is interposed between the third and fourth metatarsal heads.  Bone marrow edema radiates  through the third metatarsal shaft to the base of the metatarsal. Neuropathic midfoot. There is a fourth MTP joint effusion which may be reactive or represent septic arthritis from the adjacent abscess. The intermetatarsal component of the abscess is best viewed on axial imaging (image 25 series 5). First and second toe amputations noted.  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 Abscess between the third and fourth metatarsal heads. Fourth MTP joint effusion which may be reactive or represent septic arthritis.   Electronically Signed   By: Dereck Ligas M.D.   On: 06/19/2014 08:23   Dg Foot Complete Right  06/18/2014   CLINICAL DATA:  Swelling RIGHT toes for 1 week, open wound at bottom of RIGHT foot at MTP joint region, history diabetes  EXAM: RIGHT FOOT COMPLETE - 3+ VIEW  COMPARISON:  01/15/2012  FINDINGS: Prior amputations of great toe and of second toe at the mid metatarsal level.  Dislocation at third MTP joint.  Diffuse osseous demineralization and scattered soft tissue swelling.  Scattered intertarsal degenerative changes.  Mild sclerosis of the distal second metatarsal similar to previous exam question old healed fracture or stress response.  No definite acute fracture, dislocation or bone destruction.  IMPRESSION: Amputations of the first and second toes.  Osseous demineralization with scattered degenerative changes and soft tissue swelling.  No definite acute osseous findings.  If patient has persistent clinical suspicion of osteomyelitis or deep soft tissue infection, recommend MR.   Electronically Signed   By: Lavonia Dana M.D.   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 She states that over the  last 3 days she's been having burning with urination and persistent discomfort and a feeling of incomplete emptying of her bladder. She was seen at [facility] yesterday, and she was given Bactrim for urinary tract infection. She has been taking this over the last 24 hours, but she has not had relief of her symptoms. She also reports developing a callus on the bottom of her right foot several months ago. She reports picking at it 3 days ago and noticing a purulent discharge. Over the past few days, she is also noticing increasing erythema surrounding the ulcer. She says that she is unable to feel anything in her feet bilaterally. She denies fever, chills, nausea, vomiting, shortness of breath, or chest pain.  In the ER, she was afebrile but slightly tachycardic initially to 104. Blood cultures were obtained, and she was given vancomycin and Zosyn IV.  Hospital Course by problem list: Principal Problem:   Diabetic foot infection Active Problems:   DM2 (diabetes mellitus, type 2)   Diabetic foot ulcer   Acute-on-chronic kidney injury  Anemia   UTI (urinary tract infection)   Essential hypertension   Diabetic osteomyelitis   Septic arthritis of foot   #Infected diabetic R foot ulcer with 3rd metatarsal osteomyelitis, now s/p transmetatarsal amputation: Ruth Douglas has diabetic foot ulcer with MRI demonstrating osteomyelitis of the 3rd metatarsal head Douglas sinus tract extending to ulcer plantar to the 3rd metatarsal head as well as abscess between the 3rd and 4th metatarsal heads. Initially received vanc/zosyn in ED but was put on ceftriaxone/flagyl for diabetic foot ulcer which was broadened to vanc/zosyn with MRI results. She was seen by Dr Sharol Given who elected for transmetatarsal amputation, performed 6/06 without complications. She received iv vancomycin and zosyn for 48 hours then antibiotics were discontinued per Dr Sharol Given. She has no sensation in the foot so she requires no analgesics at  discharge. She was discharged on lantus 24 u but PCP should reassess and optimize glycemic control.  #Urinary tract infection Ruth Douglas started treatment one day prior to presentation and was placed on broad spectrum antibiotics as noted above. She says dysuria has improved some but still present but no systemic complaints. She denied any fever, leukocytosis, or CVA tenderness on exam. UCx with 50,000 colonies of multiple bacterial morphotypes, non predominant. She received a total of six days of antibiotics, one prior to presentation then five of broad spectrum for the diabetic foot ulcer.  #Gynecologic concern: Ruth Douglas was examined by night MD team for pressure sensation in pelvis. They note uterine prolapse and started norco. On chaperoned exam the next day, it appears that she has prolapsed vaginal walls. No signs of infection or necrosis. She needs out-patient gynecology follow-up for full formal pelvic exam.  #Acute on chronic kidney disease Creatinine elevated to 2.62 on presentation, up from 1.08 in 2014. This may represent progressive CKD as she has FeNa 1.4% although renal ultrasound negative or possible prerenal kidney injury from infection. Creatinine down to 1.58 prior to discharge with hydration.  #Type 2 diabetes Glucose elevated to 307 on presentation, likely poor long-term control of diabetes. Hemoglobin A1c was only 7.3 in 2013. Reports not taking in metformin currently. AM CBG 234. Seen by diabetes coordinator who recommended going up on lantus to 17 u. -appreciate diabetes coordinator -f/u hemoglobin A1c. -Continue home Lantus 17 units daily at bedtime. -CBGs before meals and at bedtime with sliding scale insulin.  #Anemia Hemoglobin elevated 10.7 on presentation from previous baseline in 2014 of approximately 8. MCV 83 with normal folate, vitamin b12, low iron 35 TIBC 249, ferritin normal at 113. No previous anemia panel in our system. She was previously on ferrous  sulfate which was continued. Hemoglobin 9.3 the morning of discharge and should be rechecked by PCP.  #Hyperlipidemia Reports not taking atorvastatin 40 mg daily. PCP should consider restarting statin  #Hypertension She was continued on her amlodipine 5 mg daily  Discharge Vitals:   BP 127/48 mmHg  Pulse 83  Temp(Src) 98 F (36.7 C) (Oral)  Resp 15  Ht 5\' 3"  (1.6 m)  Wt 148 lb 13 oz (67.5 kg)  BMI 26.37 kg/m2  SpO2 98%  Discharge Physical Exam: Gen: A&O x 4, No acute distress, well developed, well nourished; laying in bed  HEENT: Atraumatic, EOMI, sclerae anicteric, moist mucous membranes Heart: Regular rate and rhythm, normal S1 S2, no murmurs, rubs, or gallops Lungs: Clear to auscultation bilaterally, respirations unlabored Abd: Soft, non-tender, no CVA tenderness, non-distended, + bowel sounds, no hepatosplenomegaly Ext: L foot s/p amputation of all digits with  strong DP pulse, well healed, R foot well bandaged  Discharge Labs:  Basic Metabolic Panel:  Recent Labs Lab 06/21/14 0556 06/23/14 0715  NA 137 135  K 4.5 3.7  CL 103 99  CO2 26 26  GLUCOSE 210* 74  BUN 16 12  CREATININE 1.64* 1.58*  CALCIUM 8.7 8.6   Liver Function Tests:  Recent Labs Lab 06/19/14 0820  AST 14  ALT 12  ALKPHOS 66  BILITOT 0.5  PROT 6.9  ALBUMIN 3.0*  CBC:  Recent Labs Lab 06/18/14 1615 06/19/14 0820  06/21/14 0556 06/23/14 0715  WBC 6.4 4.8  < > 6.1 5.8  NEUTROABS 4.8 2.6  --   --   --   HGB 10.7* 9.7*  < > 9.3* 9.3*  HCT 32.5* 29.1*  < > 29.1* 29.5*  MCV 81.7 82.7  < > 84.3 85.3  PLT 345 315  < > 295 283  < > = values in this interval not displayed. CBG:  Recent Labs Lab 06/22/14 0812 06/22/14 1205 06/22/14 1730 06/22/14 2139 06/23/14 0812 06/23/14 1213  GLUCAP 184* 169* 164* 105* 84 242*   Anemia Panel:  Recent Labs Lab 06/19/14 0820  VITAMINB12 628  FOLATE >20.0  FERRITIN 113  TIBC 249*  IRON 35*  RETICCTPCT 0.9   Urinalysis:  Recent  Labs Lab 06/18/14 1620  COLORURINE YELLOW  LABSPEC 1.017  PHURINE 6.0  GLUCOSEU 100*  HGBUR LARGE*  BILIRUBINUR NEGATIVE  KETONESUR NEGATIVE  PROTEINUR 100*  UROBILINOGEN 0.2  NITRITE NEGATIVE  LEUKOCYTESUR LARGE*    Signed: Kelby Aline, MD 06/23/2014, 2:11 PM    Services Ordered on Discharge: HHPT, HHOT Equipment Ordered on Discharge: none

## 2014-06-23 NOTE — Progress Notes (Signed)
[name] to be D/Douglas'd Home per MD order.  Discussed with the patient and all questions fully answered.  VSS, Skin clean, dry and intact without evidence of skin break down, no evidence of skin tears noted. IV catheter discontinued intact. Site without signs and symptoms of complications. Dressing and pressure applied.  An After Visit Summary was printed and given to the patient. Patient received prescription.  D/Douglas education completed with patient/family including follow up instructions, medication list, d/Douglas activities limitations if indicated, with other d/Douglas instructions as indicated by MD - patient able to verbalize understanding, all questions fully answered.   Patient instructed to return to ED, call 911, or call MD for any changes in condition.   Patient escorted via [place], and D/Douglas home via private auto.  [name], Ruth Douglas 06/23/2014 2:23 PM

## 2014-06-24 ENCOUNTER — Encounter (HOSPITAL_COMMUNITY): Payer: Self-pay | Admitting: Orthopedic Surgery

## 2014-06-24 LAB — CULTURE, BLOOD (ROUTINE X 2)
CULTURE: NO GROWTH
CULTURE: NO GROWTH

## 2014-06-24 LAB — HEMOGLOBIN A1C
Hgb A1c MFr Bld: 11.9 % — ABNORMAL HIGH (ref 4.8–5.6)
MEAN PLASMA GLUCOSE: 295 mg/dL

## 2014-06-27 ENCOUNTER — Inpatient Hospital Stay: Payer: Medicare PPO | Admitting: Family Medicine

## 2014-08-08 ENCOUNTER — Other Ambulatory Visit: Payer: Self-pay | Admitting: Urology

## 2014-08-08 ENCOUNTER — Inpatient Hospital Stay (HOSPITAL_COMMUNITY): Payer: Medicare FFS | Admitting: Certified Registered Nurse Anesthetist

## 2014-08-08 ENCOUNTER — Encounter (HOSPITAL_COMMUNITY): Payer: Self-pay | Admitting: *Deleted

## 2014-08-08 ENCOUNTER — Encounter (HOSPITAL_COMMUNITY)
Admission: EM | Disposition: A | Discharge status: Home / Self Care | Payer: Self-pay | Source: Home / Self Care | Attending: Family Medicine

## 2014-08-08 ENCOUNTER — Emergency Department (HOSPITAL_COMMUNITY): Payer: Medicare FFS

## 2014-08-08 ENCOUNTER — Inpatient Hospital Stay (HOSPITAL_COMMUNITY): Payer: Medicare FFS

## 2014-08-08 ENCOUNTER — Inpatient Hospital Stay (HOSPITAL_COMMUNITY)
Admission: EM | Admit: 2014-08-08 | Discharge: 2014-08-11 | DRG: 694 | Disposition: A | Discharge status: Home / Self Care | Payer: Medicare FFS | Attending: Family Medicine | Admitting: Family Medicine

## 2014-08-08 DIAGNOSIS — E876 Hypokalemia: Secondary | ICD-10-CM | POA: Diagnosis present

## 2014-08-08 DIAGNOSIS — N189 Chronic kidney disease, unspecified: Secondary | ICD-10-CM | POA: Diagnosis present

## 2014-08-08 DIAGNOSIS — E1169 Type 2 diabetes mellitus with other specified complication: Secondary | ICD-10-CM | POA: Diagnosis present

## 2014-08-08 DIAGNOSIS — N183 Chronic kidney disease, stage 3 (moderate): Secondary | ICD-10-CM | POA: Diagnosis present

## 2014-08-08 DIAGNOSIS — I129 Hypertensive chronic kidney disease with stage 1 through stage 4 chronic kidney disease, or unspecified chronic kidney disease: Secondary | ICD-10-CM | POA: Diagnosis present

## 2014-08-08 DIAGNOSIS — N12 Tubulo-interstitial nephritis, not specified as acute or chronic: Secondary | ICD-10-CM | POA: Diagnosis present

## 2014-08-08 DIAGNOSIS — R112 Nausea with vomiting, unspecified: Secondary | ICD-10-CM | POA: Diagnosis not present

## 2014-08-08 DIAGNOSIS — T8384XA Pain from genitourinary prosthetic devices, implants and grafts, initial encounter: Secondary | ICD-10-CM | POA: Diagnosis not present

## 2014-08-08 DIAGNOSIS — N3020 Other chronic cystitis without hematuria: Secondary | ICD-10-CM | POA: Diagnosis present

## 2014-08-08 DIAGNOSIS — N390 Urinary tract infection, site not specified: Secondary | ICD-10-CM | POA: Insufficient documentation

## 2014-08-08 DIAGNOSIS — N8110 Cystocele, unspecified: Secondary | ICD-10-CM | POA: Diagnosis present

## 2014-08-08 DIAGNOSIS — N200 Calculus of kidney: Secondary | ICD-10-CM | POA: Diagnosis not present

## 2014-08-08 DIAGNOSIS — Z794 Long term (current) use of insulin: Secondary | ICD-10-CM | POA: Diagnosis not present

## 2014-08-08 DIAGNOSIS — Z89422 Acquired absence of other left toe(s): Secondary | ICD-10-CM | POA: Diagnosis not present

## 2014-08-08 DIAGNOSIS — T86898 Other complications of other transplanted tissue: Secondary | ICD-10-CM

## 2014-08-08 DIAGNOSIS — E1165 Type 2 diabetes mellitus with hyperglycemia: Secondary | ICD-10-CM | POA: Diagnosis present

## 2014-08-08 DIAGNOSIS — N179 Acute kidney failure, unspecified: Secondary | ICD-10-CM | POA: Diagnosis present

## 2014-08-08 DIAGNOSIS — N132 Hydronephrosis with renal and ureteral calculous obstruction: Secondary | ICD-10-CM | POA: Diagnosis present

## 2014-08-08 DIAGNOSIS — Z89431 Acquired absence of right foot: Secondary | ICD-10-CM

## 2014-08-08 DIAGNOSIS — Z87442 Personal history of urinary calculi: Secondary | ICD-10-CM

## 2014-08-08 DIAGNOSIS — E785 Hyperlipidemia, unspecified: Secondary | ICD-10-CM | POA: Diagnosis present

## 2014-08-08 DIAGNOSIS — E871 Hypo-osmolality and hyponatremia: Secondary | ICD-10-CM | POA: Diagnosis present

## 2014-08-08 DIAGNOSIS — I509 Heart failure, unspecified: Secondary | ICD-10-CM | POA: Diagnosis present

## 2014-08-08 DIAGNOSIS — I10 Essential (primary) hypertension: Secondary | ICD-10-CM | POA: Diagnosis present

## 2014-08-08 DIAGNOSIS — Y846 Urinary catheterization as the cause of abnormal reaction of the patient, or of later complication, without mention of misadventure at the time of the procedure: Secondary | ICD-10-CM | POA: Diagnosis not present

## 2014-08-08 DIAGNOSIS — N814 Uterovaginal prolapse, unspecified: Secondary | ICD-10-CM | POA: Diagnosis present

## 2014-08-08 DIAGNOSIS — Z66 Do not resuscitate: Secondary | ICD-10-CM | POA: Diagnosis present

## 2014-08-08 DIAGNOSIS — Z86718 Personal history of other venous thrombosis and embolism: Secondary | ICD-10-CM

## 2014-08-08 DIAGNOSIS — R1012 Left upper quadrant pain: Secondary | ICD-10-CM

## 2014-08-08 DIAGNOSIS — R109 Unspecified abdominal pain: Secondary | ICD-10-CM | POA: Insufficient documentation

## 2014-08-08 DIAGNOSIS — E119 Type 2 diabetes mellitus without complications: Secondary | ICD-10-CM

## 2014-08-08 DIAGNOSIS — N202 Calculus of kidney with calculus of ureter: Secondary | ICD-10-CM | POA: Diagnosis present

## 2014-08-08 HISTORY — DX: Urinary tract infection, site not specified: N39.0

## 2014-08-08 HISTORY — PX: CYSTOSCOPY W/ URETERAL STENT PLACEMENT: SHX1429

## 2014-08-08 LAB — COMPREHENSIVE METABOLIC PANEL
ALBUMIN: 3.7 g/dL (ref 3.5–5.0)
ALT: 10 U/L — ABNORMAL LOW (ref 14–54)
AST: 17 U/L (ref 15–41)
Alkaline Phosphatase: 60 U/L (ref 38–126)
Anion gap: 12 (ref 5–15)
BUN: 35 mg/dL — ABNORMAL HIGH (ref 6–20)
CO2: 21 mmol/L — ABNORMAL LOW (ref 22–32)
Calcium: 8.9 mg/dL (ref 8.9–10.3)
Chloride: 100 mmol/L — ABNORMAL LOW (ref 101–111)
Creatinine, Ser: 3.16 mg/dL — ABNORMAL HIGH (ref 0.44–1.00)
GFR calc Af Amer: 16 mL/min — ABNORMAL LOW (ref >60)
GFR calc non Af Amer: 14 mL/min — ABNORMAL LOW (ref >60)
Glucose, Bld: 186 mg/dL — ABNORMAL HIGH (ref 65–99)
POTASSIUM: 3.7 mmol/L (ref 3.5–5.1)
SODIUM: 133 mmol/L — AB (ref 135–145)
TOTAL PROTEIN: 7.3 g/dL (ref 6.5–8.1)
Total Bilirubin: 0.6 mg/dL (ref 0.3–1.2)

## 2014-08-08 LAB — CBC WITH DIFFERENTIAL/PLATELET
Basophils Absolute: 0 10*3/uL (ref 0.0–0.1)
Basophils Relative: 0 % (ref 0–1)
EOS ABS: 0.1 10*3/uL (ref 0.0–0.7)
Eosinophils Relative: 2 % (ref 0–5)
HEMATOCRIT: 31.5 % — AB (ref 36.0–46.0)
Hemoglobin: 10.5 g/dL — ABNORMAL LOW (ref 12.0–15.0)
Lymphocytes Relative: 16 % (ref 12–46)
Lymphs Abs: 1 10*3/uL (ref 0.7–4.0)
MCH: 26.9 pg (ref 26.0–34.0)
MCHC: 33.3 g/dL (ref 30.0–36.0)
MCV: 80.8 fL (ref 78.0–100.0)
MONO ABS: 0.7 10*3/uL (ref 0.1–1.0)
MONOS PCT: 11 % (ref 3–12)
NEUTROS ABS: 4.4 10*3/uL (ref 1.7–7.7)
NEUTROS PCT: 71 % (ref 43–77)
Platelets: 188 10*3/uL (ref 150–400)
RBC: 3.9 MIL/uL (ref 3.87–5.11)
RDW: 13.6 % (ref 11.5–15.5)
WBC: 6.2 10*3/uL (ref 4.0–10.5)

## 2014-08-08 LAB — URINE MICROSCOPIC-ADD ON

## 2014-08-08 LAB — URINALYSIS, ROUTINE W REFLEX MICROSCOPIC
BILIRUBIN URINE: NEGATIVE
GLUCOSE, UA: NEGATIVE mg/dL
Ketones, ur: NEGATIVE mg/dL
Nitrite: NEGATIVE
PROTEIN: 100 mg/dL — AB
SPECIFIC GRAVITY, URINE: 1.017 (ref 1.005–1.030)
Urobilinogen, UA: 0.2 mg/dL (ref 0.0–1.0)
pH: 5.5 (ref 5.0–8.0)

## 2014-08-08 LAB — GLUCOSE, CAPILLARY: GLUCOSE-CAPILLARY: 165 mg/dL — AB (ref 65–99)

## 2014-08-08 LAB — LIPASE, BLOOD: Lipase: 60 U/L — ABNORMAL HIGH (ref 22–51)

## 2014-08-08 SURGERY — CYSTOSCOPY, WITH RETROGRADE PYELOGRAM AND URETERAL STENT INSERTION
Anesthesia: General | Site: Ureter | Laterality: Left

## 2014-08-08 MED ORDER — INSULIN GLARGINE 100 UNIT/ML ~~LOC~~ SOLN
12.0000 [IU] | Freq: Every day | SUBCUTANEOUS | Status: DC
  Administered 2014-08-09 – 2014-08-10 (×2): 12 [IU] via SUBCUTANEOUS
  Filled 2014-08-08 (×4): qty 0.12

## 2014-08-08 MED ORDER — FENTANYL CITRATE (PF) 250 MCG/5ML IJ SOLN
INTRAMUSCULAR | Status: AC
  Filled 2014-08-08: qty 5

## 2014-08-08 MED ORDER — DEXTROSE 5 % IV SOLN
1.0000 g | Freq: Once | INTRAVENOUS | Status: AC
  Administered 2014-08-08: 1 g via INTRAVENOUS
  Filled 2014-08-08: qty 10

## 2014-08-08 MED ORDER — INSULIN ASPART 100 UNIT/ML ~~LOC~~ SOLN
0.0000 [IU] | Freq: Three times a day (TID) | SUBCUTANEOUS | Status: DC
  Administered 2014-08-09: 2 [IU] via SUBCUTANEOUS
  Administered 2014-08-09 – 2014-08-10 (×3): 1 [IU] via SUBCUTANEOUS
  Administered 2014-08-10: 2 [IU] via SUBCUTANEOUS
  Administered 2014-08-11 (×2): 1 [IU] via SUBCUTANEOUS

## 2014-08-08 MED ORDER — HYDROMORPHONE HCL 1 MG/ML IJ SOLN: 0.2500 mg | INTRAMUSCULAR | Status: DC | PRN

## 2014-08-08 MED ORDER — ONDANSETRON HCL 4 MG/2ML IJ SOLN
4.0000 mg | Freq: Once | INTRAMUSCULAR | Status: AC
  Administered 2014-08-08: 4 mg via INTRAVENOUS
  Filled 2014-08-08: qty 2

## 2014-08-08 MED ORDER — MORPHINE SULFATE 4 MG/ML IJ SOLN
4.0000 mg | Freq: Once | INTRAMUSCULAR | Status: AC
  Administered 2014-08-08: 4 mg via INTRAVENOUS
  Filled 2014-08-08: qty 1

## 2014-08-08 MED ORDER — LIDOCAINE HCL 2 % EX GEL
CUTANEOUS | Status: AC
  Filled 2014-08-08: qty 20

## 2014-08-08 MED ORDER — FENTANYL CITRATE (PF) 100 MCG/2ML IJ SOLN
INTRAMUSCULAR | Status: DC | PRN
  Administered 2014-08-08: 50 ug via INTRAVENOUS

## 2014-08-08 MED ORDER — ONDANSETRON HCL 4 MG PO TABS: 4.0000 mg | ORAL_TABLET | Freq: Four times a day (QID) | ORAL | Status: DC | PRN

## 2014-08-08 MED ORDER — POLYETHYLENE GLYCOL 3350 17 G PO PACK
17.0000 g | PACK | Freq: Every day | ORAL | Status: DC | PRN
Start: 2014-08-08 — End: 2014-08-11
  Filled 2014-08-08: qty 1

## 2014-08-08 MED ORDER — MIDAZOLAM HCL 2 MG/2ML IJ SOLN
INTRAMUSCULAR | Status: AC
  Filled 2014-08-08: qty 2

## 2014-08-08 MED ORDER — ONDANSETRON HCL 4 MG/2ML IJ SOLN
4.0000 mg | Freq: Four times a day (QID) | INTRAMUSCULAR | Status: DC | PRN
  Administered 2014-08-08 – 2014-08-09 (×2): 4 mg via INTRAVENOUS
  Filled 2014-08-08 (×2): qty 2

## 2014-08-08 MED ORDER — VANCOMYCIN HCL 10 G IV SOLR
1500.0000 mg | Freq: Once | INTRAVENOUS | Status: DC
  Filled 2014-08-08: qty 1500

## 2014-08-08 MED ORDER — MEPERIDINE HCL 25 MG/ML IJ SOLN: 6.2500 mg | INTRAMUSCULAR | Status: DC | PRN

## 2014-08-08 MED ORDER — PROMETHAZINE HCL 25 MG/ML IJ SOLN: 6.2500 mg | INTRAMUSCULAR | Status: DC | PRN

## 2014-08-08 MED ORDER — CETYLPYRIDINIUM CHLORIDE 0.05 % MT LIQD
7.0000 mL | Freq: Two times a day (BID) | OROMUCOSAL | Status: DC
  Administered 2014-08-09 – 2014-08-10 (×2): 7 mL via OROMUCOSAL

## 2014-08-08 MED ORDER — ONDANSETRON HCL 4 MG/2ML IJ SOLN
INTRAMUSCULAR | Status: DC | PRN
  Administered 2014-08-08: 4 mg via INTRAVENOUS

## 2014-08-08 MED ORDER — CEFAZOLIN SODIUM 1-5 GM-% IV SOLN: 1.0000 g | INTRAVENOUS | Status: DC

## 2014-08-08 MED ORDER — CEFTRIAXONE SODIUM IN DEXTROSE 20 MG/ML IV SOLN
1.0000 g | INTRAVENOUS | Status: DC
  Administered 2014-08-09 – 2014-08-10 (×2): 1 g via INTRAVENOUS
  Filled 2014-08-08 (×2): qty 50

## 2014-08-08 MED ORDER — CIPROFLOXACIN IN D5W 400 MG/200ML IV SOLN
400.0000 mg | INTRAVENOUS | Status: AC
  Administered 2014-08-08: 400 mg via INTRAVENOUS
  Filled 2014-08-08: qty 200

## 2014-08-08 MED ORDER — SODIUM CHLORIDE 0.9 % IV SOLN
INTRAVENOUS | Status: DC
  Administered 2014-08-08 – 2014-08-10 (×5): via INTRAVENOUS

## 2014-08-08 MED ORDER — SODIUM CHLORIDE 0.9 % IV BOLUS (SEPSIS)
1000.0000 mL | Freq: Once | INTRAVENOUS | Status: AC
  Administered 2014-08-08: 1000 mL via INTRAVENOUS

## 2014-08-08 MED ORDER — SUCCINYLCHOLINE CHLORIDE 20 MG/ML IJ SOLN
INTRAMUSCULAR | Status: DC | PRN
  Administered 2014-08-08: 120 mg via INTRAVENOUS

## 2014-08-08 MED ORDER — LIDOCAINE HCL 2 % EX GEL
CUTANEOUS | Status: DC | PRN
  Administered 2014-08-08: 1 via URETHRAL

## 2014-08-08 MED ORDER — PROPOFOL 10 MG/ML IV BOLUS
INTRAVENOUS | Status: DC | PRN
  Administered 2014-08-08: 140 mg via INTRAVENOUS

## 2014-08-08 MED ORDER — 0.9 % SODIUM CHLORIDE (POUR BTL) OPTIME
TOPICAL | Status: DC | PRN
  Administered 2014-08-08: 1000 mL

## 2014-08-08 MED ORDER — IOHEXOL 300 MG/ML  SOLN
INTRAMUSCULAR | Status: DC | PRN
  Administered 2014-08-08: 30 mL via URETHRAL
  Administered 2014-08-08: 50 mL via URETHRAL

## 2014-08-08 MED ORDER — LIDOCAINE HCL (CARDIAC) 20 MG/ML IV SOLN
INTRAVENOUS | Status: DC | PRN
Start: 2014-08-08 — End: 2014-08-08
  Administered 2014-08-08: 100 mg via INTRAVENOUS

## 2014-08-08 MED ORDER — STERILE WATER FOR IRRIGATION IR SOLN
Status: DC | PRN
  Administered 2014-08-08: 3000 mL via INTRAVESICAL

## 2014-08-08 MED ORDER — HYDROMORPHONE HCL 1 MG/ML IJ SOLN
0.5000 mg | INTRAMUSCULAR | Status: DC | PRN
  Administered 2014-08-08 – 2014-08-09 (×2): 0.5 mg via INTRAVENOUS
  Filled 2014-08-08 (×2): qty 1

## 2014-08-08 SURGICAL SUPPLY — 30 items
APL SKNCLS STERI-STRIP NONHPOA (GAUZE/BANDAGES/DRESSINGS)
BAG URINE DRAINAGE (UROLOGICAL SUPPLIES) ×2 IMPLANT
BAG URO CATCHER STRL LF (DRAPE) ×2 IMPLANT
BENZOIN TINCTURE PRP APPL 2/3 (GAUZE/BANDAGES/DRESSINGS) IMPLANT
CATH FOLEY 2WAY SLVR  5CC 16FR (CATHETERS) ×1
CATH FOLEY 2WAY SLVR 5CC 16FR (CATHETERS) IMPLANT
GLOVE BIO SURGEON STRL SZ7.5 (GLOVE) ×2 IMPLANT
GLOVE BIOGEL PI IND STRL 6.5 (GLOVE) IMPLANT
GLOVE BIOGEL PI INDICATOR 6.5 (GLOVE) ×1
GLOVE ECLIPSE 6.5 STRL STRAW (GLOVE) ×2 IMPLANT
GOWN STRL REUS W/ TWL LRG LVL3 (GOWN DISPOSABLE) ×1 IMPLANT
GOWN STRL REUS W/TWL LRG LVL3 (GOWN DISPOSABLE) ×4
GUIDEWIRE ANG ZIPWIRE 038X150 (WIRE) ×1 IMPLANT
GUIDEWIRE ANGLED .035X150CM (WIRE) ×1 IMPLANT
GUIDEWIRE STR DUAL SENSOR (WIRE) ×2 IMPLANT
KIT ROOM TURNOVER OR (KITS) ×2 IMPLANT
MANIFOLD NEPTUNE WASTE (CANNULA) ×1 IMPLANT
NS IRRIG 1000ML POUR BTL (IV SOLUTION) ×2 IMPLANT
OPEN ENDED URETERAL CATHETER ×1 IMPLANT
PACK CYSTOSCOPY (CUSTOM PROCEDURE TRAY) ×2 IMPLANT
PAD ARMBOARD 7.5X6 YLW CONV (MISCELLANEOUS) ×4 IMPLANT
PLUG CATH AND CAP STER (CATHETERS) IMPLANT
STENT URET 6FRX24 CONTOUR (STENTS) ×1 IMPLANT
STENT URET 6FRX26 CONTOUR (STENTS) ×1 IMPLANT
SYR CONTROL 10ML LL (SYRINGE) ×2 IMPLANT
SYRINGE TOOMEY DISP (SYRINGE) ×1 IMPLANT
UNDERPAD 30X30 INCONTINENT (UNDERPADS AND DIAPERS) ×2 IMPLANT
WATER STERILE IRR 1000ML POUR (IV SOLUTION) ×2 IMPLANT
WATER STERILE IRR 3000ML UROMA (IV SOLUTION) ×1 IMPLANT
WIRE COONS/BENSON .038X145CM (WIRE) IMPLANT

## 2014-08-08 NOTE — Consult Note (Signed)
Urology Consult   Physician requesting consult: Elnora Morrison  Reason for consult: Left hydronephrosis and 27mm ureteral calculus  History of Present Illness: Ruth Douglas is a 74 y.o. female with PMH significant for HTN, CKD, DM II, CHF, osteomyelitis, and nephrolithiasis presented to the ED today with c/o LLQ pain that began last night. The pain does not radiate.  She has had N/V today but denies F/C and hematuria.  CT scan in the ED revealed a 12mm left ureteral stone with hydroureteronephrosis.  She also likely has a UTI.  Urine was sent for culture.  She was given rocephin. Cr is elevated at 3.16 (previous value 1.58 06/23/14).  WBC 6.2.   She has been treated with two courses of ABx for a UTI over the past month.  She does not recall the med names.  She states she began to have constant urine leakage, dysuria, and cloudy urine approx 1 month ago.  She completed the second Abx course 2 days ago.  Her sx have not changed.  She was evaled by GYN last Friday and found to have uterine and bladder prolapse.  She states she is scheduled for repair of these issues.   She has had two previous episodes of nephrolithiasis with the last one in 2013.  It did require intervention with cysto, laser, and stent.     Pt has not eaten since 7pm last night and had a sip of water with meds early this morning.  She is currently resting but states the LLQ pain is still there.  She denies F/C, HA, SOB, and CP.     Past Medical History  Diagnosis Date  . Renal disorder   . Blood transfusion without reported diagnosis   . Diabetes mellitus without complication   . Hypertension   . CHF (congestive heart failure)   . UTI (lower urinary tract infection)   nephrolithiasis CKD DVT Hyperlipidemia Osteomyelitis   Past Surgical History  Procedure Laterality Date  . Toe amputation    . Cesarean section    . Cystoscopy with stent placement  01/16/2012    Procedure: CYSTOSCOPY WITH STENT PLACEMENT;  Surgeon:  Dutch Gray, MD;  Location: [facility];  Service: Urology;  Laterality: Left;  cysto, retrograde , ureteroscopy , stone extraction with laser lithotripsy, stent placement on left, bladder biopsies with fulgaration  . Holmium laser application  47/10/6281    Procedure: HOLMIUM LASER APPLICATION;  Surgeon: Dutch Gray, MD;  Location: [facility];  Service: Urology;  Laterality: Left;  . Amputation Left 06/01/2012    Procedure: LEFT FOOT TRANSMETATARSEL AMPUTATION;  Surgeon: Wylene Simmer, MD;  Location: [place];  Service: Orthopedics;  Laterality: Left;  . Achilles tendon surgery Left 06/01/2012    Procedure: PERCUTANEOUS TENDON ACHILLES LENGTHING;  Surgeon: Wylene Simmer, MD;  Location: [place];  Service: Orthopedics;  Laterality: Left;  . Amputation Right 06/20/2014    Procedure: AMPUTATION FOOT;  Surgeon: Newt Minion, MD;  Location: [place];  Service: Orthopedics;  Laterality: Right;     Current Hospital Medications:  Home meds:    Medication List    ASK your doctor about these medications        acetaminophen 500 MG tablet  Commonly known as:  TYLENOL  Take 500 mg by mouth every 6 (six) hours as needed for mild pain.     amLODipine 5 MG tablet  Commonly known as:  NORVASC  Take 1 tablet (5 mg total) by mouth daily.     CRANBERRY PO  Take 1 capsule by mouth daily.     ferrous sulfate 325 (65 FE) MG tablet  Take 1 tablet (325 mg total) by mouth 2 (two) times daily with a meal.     insulin glargine 100 UNIT/ML injection  Commonly known as:  LANTUS  Inject 0.24 mLs (24 Units total) into the skin at bedtime.        Scheduled Meds: . [START ON 08/09/2014] cefTRIAXone (ROCEPHIN)  IV  1 g Intravenous Q24H  . insulin glargine  12 Units Subcutaneous QHS   Continuous Infusions: . sodium chloride     PRN Meds:.HYDROmorphone (DILAUDID) injection, ondansetron **OR** ondansetron (ZOFRAN) IV, polyethylene glycol  Allergies: No Known Allergies  Family History  Problem Relation Age of Onset  . Diabetes  Mellitus II Mother   . Diabetes Mellitus II Father   . Lung cancer Sister   . Kidney cancer Brother     Social History:  reports that she has never smoked. She does not have any smokeless tobacco history on file. She reports that she does not drink alcohol or use illicit drugs.  ROS: A complete review of systems was performed.  All systems are negative except for pertinent findings as noted.  Physical Exam:  Vital signs in last 24 hours: Temp:  [98.3 F (36.8 C)] 98.3 F (36.8 C) (06/10 1100) Pulse Rate:  [90-99] 99 (06/10 1712) Resp:  [18] 18 (06/10 1712) BP: (121-144)/(44-62) 136/55 mmHg (06/10 1712) SpO2:  [90 %-100 %] 99 % (06/10 1712) Weight:  [66.679 kg (147 lb)] 66.679 kg (147 lb) (06/10 1100) Constitutional:  Alert and oriented, No acute distress Cardiovascular: Regular rate and rhythm Respiratory: Normal respiratory effort GI: Abdomen is soft, nondistended, no abdominal masses; tender LLQ GU: No CVA tenderness Lymphatic: No lymphadenopathy Neurologic: Grossly intact, no focal deficits Psychiatric: Normal mood and affect  Laboratory Data:   Recent Labs  08/08/14 1220  WBC 6.2  HGB 10.5*  HCT 31.5*  PLT 188     Recent Labs  08/08/14 1220  NA 133*  K 3.7  CL 100*  GLUCOSE 186*  BUN 35*  CALCIUM 8.9  CREATININE 3.16*     Results for orders placed or performed during the hospital encounter of 08/08/14 (from the past 24 hour(s))  Comprehensive metabolic panel     Status: Abnormal   Collection Time: 08/08/14 12:20 PM  Result Value Ref Range   Sodium 133 (L) 135 - 145 mmol/L   Potassium 3.7 3.5 - 5.1 mmol/L   Chloride 100 (L) 101 - 111 mmol/L   CO2 21 (L) 22 - 32 mmol/L   Glucose, Bld 186 (H) 65 - 99 mg/dL   BUN 35 (H) 6 - 20 mg/dL   Creatinine, Ser 3.16 (H) 0.44 - 1.00 mg/dL   Calcium 8.9 8.9 - 10.3 mg/dL   Total Protein 7.3 6.5 - 8.1 g/dL   Albumin 3.7 3.5 - 5.0 g/dL   AST 17 15 - 41 U/L   ALT 10 (L) 14 - 54 U/L   Alkaline Phosphatase 60 38 -  126 U/L   Total Bilirubin 0.6 0.3 - 1.2 mg/dL   GFR calc non Af Amer 14 (L) >60 mL/min   GFR calc Af Amer 16 (L) >60 mL/min   Anion gap 12 5 - 15  Lipase, blood     Status: Abnormal   Collection Time: 08/08/14 12:20 PM  Result Value Ref Range   Lipase 60 (H) 22 - 51 U/L  CBC with Differential/Platelet  Status: Abnormal   Collection Time: 08/08/14 12:20 PM  Result Value Ref Range   WBC 6.2 4.0 - 10.5 K/uL   RBC 3.90 3.87 - 5.11 MIL/uL   Hemoglobin 10.5 (L) 12.0 - 15.0 g/dL   HCT 31.5 (L) 36.0 - 46.0 %   MCV 80.8 78.0 - 100.0 fL   MCH 26.9 26.0 - 34.0 pg   MCHC 33.3 30.0 - 36.0 g/dL   RDW 13.6 11.5 - 15.5 %   Platelets 188 150 - 400 K/uL   Neutrophils Relative % 71 43 - 77 %   Neutro Abs 4.4 1.7 - 7.7 K/uL   Lymphocytes Relative 16 12 - 46 %   Lymphs Abs 1.0 0.7 - 4.0 K/uL   Monocytes Relative 11 3 - 12 %   Monocytes Absolute 0.7 0.1 - 1.0 K/uL   Eosinophils Relative 2 0 - 5 %   Eosinophils Absolute 0.1 0.0 - 0.7 K/uL   Basophils Relative 0 0 - 1 %   Basophils Absolute 0.0 0.0 - 0.1 K/uL  Urinalysis, Routine w reflex microscopic (not at [facility])     Status: Abnormal   Collection Time: 08/08/14 12:42 PM  Result Value Ref Range   Color, Urine YELLOW YELLOW   APPearance TURBID (A) CLEAR   Specific Gravity, Urine 1.017 1.005 - 1.030   pH 5.5 5.0 - 8.0   Glucose, UA NEGATIVE NEGATIVE mg/dL   Hgb urine dipstick MODERATE (A) NEGATIVE   Bilirubin Urine NEGATIVE NEGATIVE   Ketones, ur NEGATIVE NEGATIVE mg/dL   Protein, ur 100 (A) NEGATIVE mg/dL   Urobilinogen, UA 0.2 0.0 - 1.0 mg/dL   Nitrite NEGATIVE NEGATIVE   Leukocytes, UA LARGE (A) NEGATIVE  Urine microscopic-add on     Status: Abnormal   Collection Time: 08/08/14 12:42 PM  Result Value Ref Range   Squamous Epithelial / LPF FEW (A) RARE   WBC, UA TOO NUMEROUS TO COUNT <3 WBC/hpf   RBC / HPF 21-50 <3 RBC/hpf   Bacteria, UA MANY (A) RARE   No results found for this or any previous visit (from the past 240  hour(s)).  Renal Function:  Recent Labs  08/08/14 1220  CREATININE 3.16*   Estimated Creatinine Clearance: 15 mL/min (by C-G formula based on Cr of 3.16).  Radiologic Imaging: Ct Renal Stone Study  08/08/2014   CLINICAL DATA:  Acute left flank pain for 1 day. History kidney stones. prior C-section and tubal ligation.  EXAM: CT ABDOMEN AND PELVIS WITHOUT CONTRAST  TECHNIQUE: Multidetector CT imaging of the abdomen and pelvis was performed following the standard protocol without IV contrast.  COMPARISON:  06/19/2014 ultrasound.  11/20/2012 CT  FINDINGS: Lower chest: Mild motion degradation. Small hiatal hernia. Normal heart size, without pericardial effusion. Multivessel coronary artery atherosclerosis.  Hepatobiliary: Normal liver. Normal gallbladder, without biliary ductal dilatation.  Pancreas: Normal, without mass or ductal dilatation.  Spleen: Normal  Adrenals/Urinary Tract: Normal adrenal glands. Renal cortical thinning bilaterally. Punctate upper pole left renal collecting system calculus. Upper pole right renal low-density lesions which are likely cysts. There may be a second lower pole left renal collecting system calculus. Moderate left-sided hydroureteronephrosis, to the level of a mid left ureteric 8 x 9 mm calculus. Moderate bladder wall thickening and surrounding edema.  Stomach/Bowel: Otherwise normal appearance of the stomach. Extensive colonic diverticulosis. Normal terminal ileum and appendix. Normal small bowel.  Vascular/Lymphatic: Aortic and branch vessel atherosclerosis. No abdominopelvic adenopathy.  Reproductive: Dystrophic calcification in the uterus could relate to an underlying  fibroid. No adnexal mass.  Other: No significant free fluid.  Mild pelvic floor laxity.  Musculoskeletal: Osteopenia.  Multilevel disc bulges.  IMPRESSION: 1. Left-sided hydroureteronephrosis secondary to a 9 mm mid left ureteric calculus. 2. Left nephrolithiasis. 3. Bladder wall thickening and  surrounding edema, consistent with cystitis. 4. Mild pelvic floor laxity.   Electronically Signed   By: Abigail Miyamoto M.D.   On: 08/08/2014 14:12    Impression/Recommendation: Left hydroureteronephrosis secondary to 59mm ureteral calculus--Dr. Junious Silk will take the pt to the OR later today for a cysto, left retrograde pyelogram, and left ureteral stent.  The stone will have to be treated separately.  She should remain on ABx for possible UTI and f/u urine culture.  Her Cr may improve with relief of hydro and nephrology is involved in her care.   Daylen Hack 08/08/2014, 5:23 PM

## 2014-08-08 NOTE — ED Provider Notes (Signed)
CSN: [id number]     Arrival date & time 08/08/14  1051 History   First MD Initiated Contact with Patient 08/08/14 1126     Chief Complaint  Patient presents with  . Abdominal Pain  . Emesis     (Consider location/radiation/quality/duration/timing/severity/associated sxs/prior Treatment) HPI Comments: 74 year old female history of diabetes, foot ulcer, recent amputation transmetatarsal 2 months prior, DVT, urine infection presents with worsening abdominal pain nausea and vomiting since yesterday. Left lower quadrant region, different than previous kidney stone history. No fevers or chills. Pain with palpation no blood in the stools.  Patient is a 73 y.o. female presenting with abdominal pain and vomiting. The history is provided by the patient and medical records.  Abdominal Pain Associated symptoms: nausea and vomiting   Associated symptoms: no chest pain, no chills, no diarrhea, no dysuria, no fever and no shortness of breath   Emesis Associated symptoms: abdominal pain   Associated symptoms: no chills, no diarrhea and no headaches     Past Medical History  Diagnosis Date  . Renal disorder   . Blood transfusion without reported diagnosis   . Diabetes mellitus without complication   . Hypertension   . CHF (congestive heart failure)   . UTI (lower urinary tract infection)    Past Surgical History  Procedure Laterality Date  . Toe amputation    . Cesarean section    . Cystoscopy with stent placement  01/16/2012    Procedure: CYSTOSCOPY WITH STENT PLACEMENT;  Surgeon: Dutch Gray, MD;  Location: [facility];  Service: Urology;  Laterality: Left;  cysto, retrograde , ureteroscopy , stone extraction with laser lithotripsy, stent placement on left, bladder biopsies with fulgaration  . Holmium laser application  96/78/9381    Procedure: HOLMIUM LASER APPLICATION;  Surgeon: Dutch Gray, MD;  Location: [facility];  Service: Urology;  Laterality: Left;  . Amputation Left 06/01/2012    Procedure:  LEFT FOOT TRANSMETATARSEL AMPUTATION;  Surgeon: Wylene Simmer, MD;  Location: [place];  Service: Orthopedics;  Laterality: Left;  . Achilles tendon surgery Left 06/01/2012    Procedure: PERCUTANEOUS TENDON ACHILLES LENGTHING;  Surgeon: Wylene Simmer, MD;  Location: [place];  Service: Orthopedics;  Laterality: Left;  . Amputation Right 06/20/2014    Procedure: AMPUTATION FOOT;  Surgeon: Newt Minion, MD;  Location: [place];  Service: Orthopedics;  Laterality: Right;   Family History  Problem Relation Age of Onset  . Diabetes Mellitus II Mother   . Diabetes Mellitus II Father   . Lung cancer Sister   . Kidney cancer Brother    History  Substance Use Topics  . Smoking status: Never Smoker   . Smokeless tobacco: Not on file  . Alcohol Use: No   OB History    No data available     Review of Systems  Constitutional: Negative for fever and chills.  HENT: Negative for congestion.   Eyes: Negative for visual disturbance.  Respiratory: Negative for shortness of breath.   Cardiovascular: Negative for chest pain.  Gastrointestinal: Positive for nausea, vomiting and abdominal pain. Negative for diarrhea.  Genitourinary: Negative for dysuria and flank pain.  Musculoskeletal: Negative for back pain, neck pain and neck stiffness.  Skin: Negative for rash.  Neurological: Negative for light-headedness and headaches.      Allergies  Review of patient's allergies indicates no known allergies.  Home Medications   Prior to Admission medications   Medication Sig Start Date End Date Taking? Authorizing Provider  acetaminophen (TYLENOL) 500 MG tablet Take  500 mg by mouth every 6 (six) hours as needed for mild pain.   Yes Historical Provider, MD  amLODipine (NORVASC) 5 MG tablet Take 1 tablet (5 mg total) by mouth daily. 06/23/14  Yes Kelby Aline, MD  CRANBERRY PO Take 1 capsule by mouth daily.   Yes Historical Provider, MD  insulin glargine (LANTUS) 100 UNIT/ML injection Inject 0.24 mLs (24 Units total)  into the skin at bedtime. 06/23/14  Yes Kelby Aline, MD  ferrous sulfate 325 (65 FE) MG tablet Take 1 tablet (325 mg total) by mouth 2 (two) times daily with a meal. Patient not taking: Reported on 08/08/2014 06/04/12   Belkys A Regalado, MD   BP 144/57 mmHg  Pulse 96  Temp(Src) 98.3 F (36.8 C) (Oral)  Resp 18  Ht 5\' 3"  (1.6 m)  Wt 147 lb (66.679 kg)  BMI 26.05 kg/m2  SpO2 98% Physical Exam  Constitutional: She is oriented to person, place, and time. She appears well-developed and well-nourished.  HENT:  Head: Normocephalic and atraumatic.  Mild dry mucous membranes  Eyes: Conjunctivae are normal. Right eye exhibits no discharge. Left eye exhibits no discharge.  Neck: Normal range of motion. Neck supple. No tracheal deviation present.  Cardiovascular: Normal rate and regular rhythm.   Pulmonary/Chest: Effort normal and breath sounds normal.  Abdominal: Soft. She exhibits no distension. There is tenderness (mild left lower flank). There is no guarding.  Musculoskeletal: She exhibits no edema.  Neurological: She is alert and oriented to person, place, and time.  Skin: Skin is warm. No rash noted.  Psychiatric: She has a normal mood and affect.  Nursing note and vitals reviewed.   ED Course  Procedures (including critical care time) Labs Review Labs Reviewed  COMPREHENSIVE METABOLIC PANEL - Abnormal; Notable for the following:    Sodium 133 (*)    Chloride 100 (*)    CO2 21 (*)    Glucose, Bld 186 (*)    BUN 35 (*)    Creatinine, Ser 3.16 (*)    ALT 10 (*)    GFR calc non Af Amer 14 (*)    GFR calc Af Amer 16 (*)    All other components within normal limits  LIPASE, BLOOD - Abnormal; Notable for the following:    Lipase 60 (*)    All other components within normal limits  URINALYSIS, ROUTINE W REFLEX MICROSCOPIC (NOT AT [facility]) - Abnormal; Notable for the following:    APPearance TURBID (*)    Hgb urine dipstick MODERATE (*)    Protein, ur 100 (*)    Leukocytes, UA  LARGE (*)    All other components within normal limits  CBC WITH DIFFERENTIAL/PLATELET - Abnormal; Notable for the following:    Hemoglobin 10.5 (*)    HCT 31.5 (*)    All other components within normal limits  URINE MICROSCOPIC-ADD ON - Abnormal; Notable for the following:    Squamous Epithelial / LPF FEW (*)    Bacteria, UA MANY (*)    All other components within normal limits  URINE CULTURE    Imaging Review No results found.   EKG Interpretation None      MDM   Final diagnoses:  Acute left flank pain   Patient presents with worsening abdominal pain, discussed differential diagnosis including urine infection, kidney stone, diverticulitis, other. Plan for IV fluids, pain meds basic labs and CT scan without contrast due to worsening kidney function.  Reviewed CT scan results showing large 9  mm kidney stone and urinalysis infected. Discussed with urology who plans on a stent placement. Patient received antibodies and urine cultured. Discussed with family medicine for admission.  The patients results and plan were reviewed and discussed.   Any x-rays performed were personally reviewed by myself.   Differential diagnosis were considered with the presenting HPI.  Medications  cefTRIAXone (ROCEPHIN) 1 g in dextrose 5 % 50 mL IVPB (1 g Intravenous New Bag/Given 08/08/14 1534)  ondansetron (ZOFRAN) injection 4 mg (4 mg Intravenous Given 08/08/14 1233)  sodium chloride 0.9 % bolus 1,000 mL (0 mLs Intravenous Stopped 08/08/14 1409)  morphine 4 MG/ML injection 4 mg (4 mg Intravenous Given 08/08/14 1234)    Filed Vitals:   08/08/14 1412 08/08/14 1430 08/08/14 1500 08/08/14 1530  BP: 134/58 136/58 121/56 125/62  Pulse: 94 93 91 94  Temp:      TempSrc:      Resp: 18     Height:      Weight:      SpO2: 94% 94% 95% 100%    Final diagnoses:  Acute left flank pain  Acute renal failure, unspecified acute renal failure type  UTI (lower urinary tract infection)  Kidney stone on  left side    Admission/ observation were discussed with the admitting physician, patient and/or family and they are comfortable with the plan.     Elnora Morrison, MD 08/08/14 1537

## 2014-08-08 NOTE — ED Notes (Signed)
Pt reports onset yesterday of LLQ pain and then n/v today. Denies diarrhea. Recent UTI which she has completed her antibiotics for. Also having urinary incontinence.

## 2014-08-08 NOTE — H&P (Signed)
[facility] Admission History and Physical Service Pager: [phone]  Patient name: Ruth Douglas Medical record number: [id number] Date of birth: 1940-11-03 Age: 74 y.o. Gender: female  Primary Care Provider: No PCP Per Patient Consultants: Urology Code Status: DNR (per discussion on admission)  Chief Complaint: LLQ pain, N/V  Assessment and Plan: Ruth Douglas is a 74 y.o. female presenting with LLQ abd pain, N/V . PMH is significant for uncontrolled T2DM s/p b/l toe amputations, HTN, multiple UTIs.  Infected, obstructing 55mm L ureteric calculus with associated hydroureteronephrosis: Pain in LLQ, N/V all likely related to obstructing ureteral stone seen on CT. Concern for infected stone given UA with many bacteria, moderate Hgb, large leuks, TNTC WBCs. Lipase mildly elevated, but pain seems to be related to stone. Afebrile, VSS, normal WBC. - admit to med-surg, [facility], attending [facility] - Urology consulted in ED, plan for stent placement tonight - Ceftriaxone (6/10>>) - pain control with Dilaudid 0.5mg  IV q2h prn - NPO pending surgical intervention - Zofran q6h prn for N/V - f/u UCx  AKI on CKD III: Cr 3.16 on admission (baseline ~1.5). Possibly post-renal obstructive pattern (though less likely with only obstructing 1 kidney) or related to dehydration (but BUN/Cr ratio ~10 goes against this). - IVF and surgery as above - Continue to monitor - Avoid nephrotoxic agents  Hyponatremia: Mild. Na 133 on admission. - Continue to monitor - IVF  T2DM: uncontrolled, last A1c 11.9 (06/19/14). On Lantus 24 units qhs at home. - Will monitor CBGs closely, sensitive SSI - Will decrease Lantus to 12 units qhs, as patient is sick and NPO  HTN: BP normotensive in ED - Holding home amlodipine 5mg  daily given infection - consider restarting in AM  FEN/GI: NPO, NS @ 177mL/hr Prophylaxis: SCDs pending surgical intervention, can likely give SQ Heparin  tomorrow  Disposition: Admit to med-surg, [facility], attending [facility], d/c pending surgery, transition to PO abx, and improvement in pain  History of Present Illness: Ruth Douglas is a 74 y.o. female presenting with left lower quadrant abdominal pain. She started having pain on her side last night. Turning did not help. She developed nausea with emesis. She has associated dysuria. She reports no fevers, chills, chest pain, shortness of breath or hematuria. She reports using a "pain reliever" which did not help with her pain (likely Tylenol). She has a history of large  kidney stones requiring Urology management. She was recently seen by gynecology last week and was scheduled for a hysterectomy. However, because of a high creatinine, she was referred to nephrology. She reports having multiple urinary tract infections in the past.   In the ED, she received morphine for her pain and zofran for nausea. A CT was obtained, showing a 62mm ureteric calculus, left-sided hydroureteronephrosis and cystitis.  Review Of Systems: Per HPI with the following additions: none, as above Otherwise 12 point review of systems was performed and was unremarkable.  Patient Active Problem List   Diagnosis Date Noted  . Nephrolithiasis 08/08/2014  . UTI (urinary tract infection) 06/19/2014  . Essential hypertension 06/19/2014  . Diabetic osteomyelitis   . Septic arthritis of foot   . Anemia 06/18/2014  . Diabetic foot infection 06/18/2014  . Osteomyelitis of foot 05/31/2012  . Cellulitis of foot 05/31/2012  . CKD (chronic kidney disease) 05/31/2012  . DVT (deep venous thrombosis) 05/31/2012  . Hyperlipidemia 05/31/2012  . DM2 (diabetes mellitus, type 2) 01/15/2012  . Diabetic foot ulcer 01/15/2012  . Renal calculus, left 01/15/2012  .  Acute-on-chronic kidney injury 01/15/2012  . Shortness of breath 01/15/2012   Past Medical History: Past Medical History  Diagnosis Date  . Renal disorder   . Blood  transfusion without reported diagnosis   . Diabetes mellitus without complication   . Hypertension   . CHF (congestive heart failure)   . UTI (lower urinary tract infection)    Past Surgical History: Past Surgical History  Procedure Laterality Date  . Toe amputation    . Cesarean section    . Cystoscopy with stent placement  01/16/2012    Procedure: CYSTOSCOPY WITH STENT PLACEMENT;  Surgeon: Dutch Gray, MD;  Location: [facility];  Service: Urology;  Laterality: Left;  cysto, retrograde , ureteroscopy , stone extraction with laser lithotripsy, stent placement on left, bladder biopsies with fulgaration  . Holmium laser application  70/62/3762    Procedure: HOLMIUM LASER APPLICATION;  Surgeon: Dutch Gray, MD;  Location: [facility];  Service: Urology;  Laterality: Left;  . Amputation Left 06/01/2012    Procedure: LEFT FOOT TRANSMETATARSEL AMPUTATION;  Surgeon: Wylene Simmer, MD;  Location: [facility];  Service: Orthopedics;  Laterality: Left;  . Achilles tendon surgery Left 06/01/2012    Procedure: PERCUTANEOUS TENDON ACHILLES LENGTHING;  Surgeon: Wylene Simmer, MD;  Location: [facility];  Service: Orthopedics;  Laterality: Left;  . Amputation Right 06/20/2014    Procedure: AMPUTATION FOOT;  Surgeon: Newt Minion, MD;  Location: [facility];  Service: Orthopedics;  Laterality: Right;   Social History: History  Substance Use Topics  . Smoking status: Never Smoker   . Smokeless tobacco: Not on file  . Alcohol Use: No   Additional social history: never smoker, no ETOH or illicit drugs.  Just lost her husband last week.  Please also refer to relevant sections of EMR.  Family History: Family History  Problem Relation Age of Onset  . Diabetes Mellitus II Mother   . Diabetes Mellitus II Father   . Lung cancer Sister   . Kidney cancer Brother    Allergies and Medications: No Known Allergies No current facility-administered medications on file prior to encounter.   Current Outpatient Prescriptions on File Prior to  Encounter  Medication Sig Dispense Refill  . amLODipine (NORVASC) 5 MG tablet Take 1 tablet (5 mg total) by mouth daily. 30 tablet 0  . insulin glargine (LANTUS) 100 UNIT/ML injection Inject 0.24 mLs (24 Units total) into the skin at bedtime. 10 mL 0  . ferrous sulfate 325 (65 FE) MG tablet Take 1 tablet (325 mg total) by mouth 2 (two) times daily with a meal. (Patient not taking: Reported on 08/08/2014) 60 tablet 0    Objective: BP 136/55 mmHg  Pulse 99  Temp(Src) 98.3 F (36.8 C) (Oral)  Resp 18  Ht 5\' 3"  (1.6 m)  Wt 147 lb (66.679 kg)  BMI 26.05 kg/m2  SpO2 99% Exam: General: Laying in hospital bed, NAD Eyes: PERRL, EOMI, anicteric sclera ENTM: Slightly dry MM, OP clear, no nasal discharge Neck: Supple, no LAD Cardiovascular: RRR, no m/r/g. 2+ DP pulses b/l Respiratory: CTAB, normal WOB Abdomen: Soft, ND, TTP in LLQ. No rebound/guarding, no HSM. MSK: Moves all extremities.  S/p amputation of all 10 toes, scars well healed Skin: No rashes Neuro: CN 2-12 grossly intact, no focal deficits Psych: Normal affect, speech normal  Labs and Imaging: CBC BMET   Recent Labs Lab 08/08/14 1220  WBC 6.2  HGB 10.5*  HCT 31.5*  PLT 188    Recent Labs Lab 08/08/14 1220  NA  133*  K 3.7  CL 100*  CO2 21*  BUN 35*  CREATININE 3.16*  GLUCOSE 186*  CALCIUM 8.9      Urinalysis    Component Value Date/Time   COLORURINE YELLOW 08/08/2014 1242   APPEARANCEUR TURBID* 08/08/2014 1242   LABSPEC 1.017 08/08/2014 1242   PHURINE 5.5 08/08/2014 1242   GLUCOSEU NEGATIVE 08/08/2014 1242   HGBUR MODERATE* 08/08/2014 1242   BILIRUBINUR NEGATIVE 08/08/2014 1242   KETONESUR NEGATIVE 08/08/2014 1242   PROTEINUR 100* 08/08/2014 1242   UROBILINOGEN 0.2 08/08/2014 1242   NITRITE NEGATIVE 08/08/2014 1242   LEUKOCYTESUR LARGE* 08/08/2014 1242    CT Renal Stone Study (6/10): 1. Left-sided hydroureteronephrosis secondary to a 9 mm mid left ureteric calculus. 2. Left nephrolithiasis. 3.  Bladder wall thickening and surrounding edema, consistent with cystitis. 4. Mild pelvic floor laxity.  Virginia Crews, MD 08/08/2014, 5:45 PM PGY-1, [facility] pager: [phone], text pages welcome

## 2014-08-08 NOTE — Transfer of Care (Signed)
Immediate Anesthesia Transfer of Care Note  Patient: Ruth Douglas  Procedure(s) Performed: Procedure(s): CYSTOSCOPY WITH RETROGRADE PYELOGRAM/URETERAL STENT PLACEMENT (Left)  Patient Location: PACU  Anesthesia Type:General  Level of Consciousness: awake, alert  and oriented  Airway & Oxygen Therapy: Patient Spontanous Breathing and Patient connected to nasal cannula oxygen  Post-op Assessment: Report given to RN and Post -op Vital signs reviewed and stable  Post vital signs: Reviewed and stable  Last Vitals:  Filed Vitals:   08/08/14 1712  BP: 136/55  Pulse: 99  Temp:   Resp: 18    Complications: No apparent anesthesia complications

## 2014-08-08 NOTE — Anesthesia Procedure Notes (Signed)
Procedure Name: Intubation Date/Time: 08/08/2014 8:10 PM Performed by: Manuela Schwartz B Pre-anesthesia Checklist: Patient identified, Emergency Drugs available, Suction available, Patient being monitored and Timeout performed Patient Re-evaluated:Patient Re-evaluated prior to inductionOxygen Delivery Method: Circle system utilized Preoxygenation: Pre-oxygenation with 100% oxygen Intubation Type: IV induction and Rapid sequence Laryngoscope Size: Mac and 3 Grade View: Grade I Tube type: Oral Tube size: 7.5 mm Airway Equipment and Method: Stylet Placement Confirmation: ETT inserted through vocal cords under direct vision,  positive ETCO2 and breath sounds checked- equal and bilateral Secured at: 21 cm Tube secured with: Tape Dental Injury: Teeth and Oropharynx as per pre-operative assessment

## 2014-08-08 NOTE — ED Notes (Signed)
Attempted report 

## 2014-08-08 NOTE — Anesthesia Preprocedure Evaluation (Addendum)
Anesthesia Evaluation  Patient identified by MRN, date of birth, ID band Patient awake    Reviewed: Allergy & Precautions, NPO status , Patient's Chart, lab work & pertinent test results  Airway Mallampati: II  TM Distance: >3 FB Neck ROM: Full    Dental no notable dental hx.    Pulmonary shortness of breath,  breath sounds clear to auscultation  Pulmonary exam normal       Cardiovascular hypertension, Pt. on medications +CHF negative cardio ROS Normal cardiovascular exam+ Valvular Problems/Murmurs MR Rhythm:Regular Rate:Normal  Echo 12/2011 - Left ventricle: The cavity size was normal. Systolic function was normal. The estimated ejection fraction was in the range of 55% to 60%. Although no diagnostic regional wall motion abnormality was identified, this possibility cannot be completely excluded on the basis of this study. - Mitral valve: Calcified annulus. Moderate regurgitation. - Left atrium: The atrium was mildly dilated. - Right atrium: The atrium was mildly dilated. - Pulmonary arteries: Systolic pressure was moderately increased. PA peak pressure: 32mm Hg (S).    Neuro/Psych negative neurological ROS  negative psych ROS   GI/Hepatic negative GI ROS, Neg liver ROS,   Endo/Other  diabetes, Type 2, Insulin Dependent  Renal/GU Renal disease     Musculoskeletal negative musculoskeletal ROS (+) Arthritis -,   Abdominal   Peds  Hematology negative hematology ROS (+) anemia ,   Anesthesia Other Findings   Reproductive/Obstetrics negative OB ROS                          Anesthesia Physical Anesthesia Plan  ASA: III  Anesthesia Plan: General   Post-op Pain Management:    Induction: Intravenous  Airway Management Planned: Oral ETT  Additional Equipment: None  Intra-op Plan:   Post-operative Plan: Extubation in OR  Informed Consent: I have reviewed the patients  History and Physical, chart, labs and discussed the procedure including the risks, benefits and alternatives for the proposed anesthesia with the patient or authorized representative who has indicated his/her understanding and acceptance.   Dental advisory given  Plan Discussed with: CRNA  Anesthesia Plan Comments:        Anesthesia Quick Evaluation                                  Anesthesia Evaluation  Patient identified by MRN, date of birth, ID band Patient awake    Reviewed: Allergy & Precautions, H&P , NPO status , Patient's Chart, lab work & pertinent test results  Airway Mallampati: II TM Distance: >3 FB Neck ROM: Full    Dental  (+) Dental Advisory Given, Edentulous Upper and Edentulous Lower   Pulmonary shortness of breath and with exertion,  breath sounds clear to auscultation  Pulmonary exam normal       Cardiovascular hypertension, Pt. on medications Rhythm:Regular Rate:Normal     Neuro/Psych negative neurological ROS  negative psych ROS   GI/Hepatic negative GI ROS, Neg liver ROS,   Endo/Other  diabetes, Well Controlled, Type 2  Renal/GU CRF and Renal InsufficiencyRenal disease     Musculoskeletal negative musculoskeletal ROS (+)   Abdominal   Peds  Hematology negative hematology ROS (+)   Anesthesia Other Findings   Reproductive/Obstetrics                         Anesthesia Physical Anesthesia Plan  ASA: III  Anesthesia  Plan: General   Post-op Pain Management:    Induction: Intravenous  Airway Management Planned: LMA  Additional Equipment:   Intra-op Plan:   Post-operative Plan: Extubation in OR  Informed Consent: I have reviewed the patients History and Physical, chart, labs and discussed the procedure including the risks, benefits and alternatives for the proposed anesthesia with the patient or authorized representative who has indicated his/her understanding and acceptance.   Dental  advisory given  Plan Discussed with: CRNA  Anesthesia Plan Comments:         Anesthesia Quick Evaluation                                   Anesthesia Evaluation  Patient identified by MRN, date of birth, ID band Patient awake    Reviewed: Allergy & Precautions, H&P , NPO status , Patient's Chart, lab work & pertinent test results  Airway Mallampati: II TM Distance: >3 FB Neck ROM: Full    Dental  (+) Dental Advisory Given, Edentulous Upper and Edentulous Lower   Pulmonary shortness of breath and with exertion,  breath sounds clear to auscultation  Pulmonary exam normal       Cardiovascular hypertension, Pt. on medications Rhythm:Regular Rate:Normal     Neuro/Psych negative neurological ROS  negative psych ROS   GI/Hepatic negative GI ROS, Neg liver ROS,   Endo/Other  diabetes, Well Controlled, Type 2  Renal/GU CRF and Renal InsufficiencyRenal disease     Musculoskeletal negative musculoskeletal ROS (+)   Abdominal   Peds  Hematology negative hematology ROS (+)   Anesthesia Other Findings   Reproductive/Obstetrics                         Anesthesia Physical Anesthesia Plan  ASA: III  Anesthesia Plan: General   Post-op Pain Management:    Induction: Intravenous  Airway Management Planned: LMA  Additional Equipment:   Intra-op Plan:   Post-operative Plan: Extubation in OR  Informed Consent: I have reviewed the patients History and Physical, chart, labs and discussed the procedure including the risks, benefits and alternatives for the proposed anesthesia with the patient or authorized representative who has indicated his/her understanding and acceptance.   Dental advisory given  Plan Discussed with: CRNA  Anesthesia Plan Comments:         Anesthesia Quick Evaluation                                   Anesthesia Evaluation  Patient identified by MRN, date of birth, ID band Patient  awake    Reviewed: Allergy & Precautions, NPO status , Patient's Chart, lab work & pertinent test results  Airway Mallampati: II  TM Distance: >3 FB Neck ROM: Full    Dental  (+) Teeth Intact, Dental Advisory Given   Pulmonary  breath sounds clear to auscultation        Cardiovascular hypertension, Pt. on medications Rhythm:Regular Rate:Normal     Neuro/Psych    GI/Hepatic   Endo/Other  diabetes, Well Controlled, Type 2, Insulin Dependent, Oral Hypoglycemic Agents  Renal/GU Renal InsufficiencyRenal disease     Musculoskeletal   Abdominal   Peds  Hematology   Anesthesia Other Findings   Reproductive/Obstetrics  Anesthesia Physical Anesthesia Plan  ASA: III  Anesthesia Plan: General   Post-op Pain Management:    Induction: Intravenous  Airway Management Planned: LMA  Additional Equipment:   Intra-op Plan:   Post-operative Plan: Extubation in OR  Informed Consent: I have reviewed the patients History and Physical, chart, labs and discussed the procedure including the risks, benefits and alternatives for the proposed anesthesia with the patient or authorized representative who has indicated his/her understanding and acceptance.   Dental advisory given  Plan Discussed with: CRNA, Anesthesiologist and Surgeon  Anesthesia Plan Comments:         Anesthesia Quick Evaluation

## 2014-08-08 NOTE — Op Note (Signed)
Preoperative diagnosis: Acute on chronic renal failure, diabetes, UTI, left ureteral stone, left hydronephrosis Postoperative diagnosis: Same  Procedure: Cystoscopy, left retrograde pyelogram, left ureteral stent placement  Surgeon: Yussef Jorge  Type of anesthesia: Gen.  Findings: Stage III complete vault prolapse, normal cervix. Urethra normal on cystoscopy. Bladder mucosa with erythema consistent with chronic cystitis with milky urine in bladder.  Left retrograde pyelogram-single ureter single collecting system unit with stone visible on scout in the proximal ureter presenting is a filling defect on retrograde. There was a bit of narrowing of the ureter as it entered the bladder was some dilation of the distal and proximal ureter but no filling defect, then the stone in the proximal ureter and proximal to this increased dilation and some tortuosity of the proximal ureter and dilation of the collecting system.  Description of procedure: After consent obtained the patient was brought to the operating room and was placed in lithotomy position and prepped and draped in the usual sterile fashion after adequate anesthesia. A timeout was performed to confirm the patient and procedure. The cystoscope was passed per urethra and the urine was quite milky. The bladder was irrigated 3. This cleared up the bladder well and there were no stones in the bladder. The mucosa was erythematous consistent with chronic cystitis. I inserted a 5 [place] open-ended ureteral catheter into the left ureteral orifice and attempted to get a retrograde pyelogram but could only get contrast into the distal and a little bit into the mid ureter. Then using a sensor wire advanced the 5 [place] open-ended into the distal ureter. The wire was removed and there was a good hydronephrotic drip. I repeated retrograde to identify the stone in the proximal collecting system. The sensor wire was advanced to the stone which was quite impacted and I  needed the 5 [place] open-ended catheter to brace the wire and get it to advanced into the kidney. Once this was accomplished I advanced the 5 French catheter into the proximal ureter and remove the wire. Repeat retrograde outlined a dilated renal pelvis and collecting system confirming proper placement of the wire in the lumen. I switched out to a Glidewire because the sensor wire was not sliding smoothly with the impaction of the stone. The 5 [place] open-ended catheter was removed a 6 x 24 cm stent was advanced but I did not feel like this was long enough. Therefore I removed the scope maintaining the stent and wire in place. I then reinserted the scope grasped the end of the stent and pulled it out just through the urethral meatus. I then advanced the wire into the collecting system and remove the stent. I backloaded the Glidewire back on the cystoscope and then advanced a 6 x 26 cm stent. The wire was removed with a good coil reconstituting in the renal pelvis and a good coil in the bladder. There was a copious amount of thick white fluid draining from the stent. The cystoscope was brought near some of this purulent fluid was drained and collected for culture. The bladder was drained and refilled and the scope removed. I placed a 40 French Foley catheter and seated the balloon at the bladder neck and left the catheter to gravity drainage. The patient was awakened and taken to the recovery room in stable condition.   Complications: None  Blood loss: Minimal   Specimens: Bladder cath urine for culture   Drains: 6 x 26 cm left ureteral stent

## 2014-08-09 LAB — GLUCOSE, CAPILLARY
GLUCOSE-CAPILLARY: 123 mg/dL — AB (ref 65–99)
GLUCOSE-CAPILLARY: 152 mg/dL — AB (ref 65–99)
Glucose-Capillary: 135 mg/dL — ABNORMAL HIGH (ref 65–99)
Glucose-Capillary: 147 mg/dL — ABNORMAL HIGH (ref 65–99)

## 2014-08-09 LAB — URINE CULTURE
COLONY COUNT: NO GROWTH
CULTURE: NO GROWTH

## 2014-08-09 LAB — CBC
HCT: 28.5 % — ABNORMAL LOW (ref 36.0–46.0)
Hemoglobin: 9.3 g/dL — ABNORMAL LOW (ref 12.0–15.0)
MCH: 26.6 pg (ref 26.0–34.0)
MCHC: 32.6 g/dL (ref 30.0–36.0)
MCV: 81.4 fL (ref 78.0–100.0)
Platelets: 164 10*3/uL (ref 150–400)
RBC: 3.5 MIL/uL — ABNORMAL LOW (ref 3.87–5.11)
RDW: 13.7 % (ref 11.5–15.5)
WBC: 6.2 10*3/uL (ref 4.0–10.5)

## 2014-08-09 LAB — BASIC METABOLIC PANEL
ANION GAP: 9 (ref 5–15)
BUN: 21 mg/dL — ABNORMAL HIGH (ref 6–20)
CALCIUM: 8 mg/dL — AB (ref 8.9–10.3)
CO2: 23 mmol/L (ref 22–32)
Chloride: 107 mmol/L (ref 101–111)
Creatinine, Ser: 2.05 mg/dL — ABNORMAL HIGH (ref 0.44–1.00)
GFR calc non Af Amer: 23 mL/min — ABNORMAL LOW (ref >60)
GFR, EST AFRICAN AMERICAN: 27 mL/min — AB (ref >60)
Glucose, Bld: 146 mg/dL — ABNORMAL HIGH (ref 65–99)
Potassium: 3.8 mmol/L (ref 3.5–5.1)
Sodium: 139 mmol/L (ref 135–145)

## 2014-08-09 MED ORDER — HEPARIN SODIUM (PORCINE) 1000 UNIT/ML IJ SOLN
5000.0000 [IU] | Freq: Three times a day (TID) | INTRAMUSCULAR | Status: DC
  Filled 2014-08-09 (×5): qty 5

## 2014-08-09 MED ORDER — HYDROCODONE-ACETAMINOPHEN 5-325 MG PO TABS
1.0000 | ORAL_TABLET | Freq: Four times a day (QID) | ORAL | Status: DC | PRN
  Administered 2014-08-09 (×2): 1 via ORAL
  Filled 2014-08-09 (×2): qty 1

## 2014-08-09 MED ORDER — OXYBUTYNIN CHLORIDE 5 MG PO TABS
2.5000 mg | ORAL_TABLET | Freq: Three times a day (TID) | ORAL | Status: DC | PRN
  Filled 2014-08-09 (×2): qty 0.5

## 2014-08-09 MED ORDER — HEPARIN SODIUM (PORCINE) 5000 UNIT/ML IJ SOLN
5000.0000 [IU] | Freq: Three times a day (TID) | INTRAMUSCULAR | Status: DC
Start: 2014-08-09 — End: 2014-08-11
  Administered 2014-08-09 – 2014-08-11 (×6): 5000 [IU] via SUBCUTANEOUS
  Filled 2014-08-09 (×6): qty 1

## 2014-08-09 NOTE — Progress Notes (Signed)
  Pt feels "100% better". Getting fever consistent with pyelonephritis.   PE: NAD Foley in place - urine much clearer.   A/P - POD#1 left ureteral stent for left ureteral stone, UTI - interestingly urine Cx no growth. I also sent a urine Cx from OR once urine was released from kidney.   Pt will need ureteroscopy for left ureteral stone eventually. Discussed importance of f/u, stent temporary. Pt says she wants prolapse addressed after stone episode resolved.

## 2014-08-09 NOTE — Anesthesia Postprocedure Evaluation (Signed)
Anesthesia Post Note  Patient: Ruth Douglas  Procedure(s) Performed: Procedure(s) (LRB): CYSTOSCOPY WITH RETROGRADE PYELOGRAM/URETERAL STENT PLACEMENT (Left)  Anesthesia type: General  Patient location: PACU  Post pain: Pain level controlled  Post assessment: Post-op Vital signs reviewed  Last Vitals: BP 153/76 mmHg  Pulse 102  Temp(Src) 37.1 C (Oral)  Resp 18  Ht 5\' 3"  (1.6 m)  Wt 147 lb (66.679 kg)  BMI 26.05 kg/m2  SpO2 96%  Post vital signs: Reviewed  Level of consciousness: sedated  Complications: No apparent anesthesia complications

## 2014-08-09 NOTE — Progress Notes (Signed)
Family Medicine Teaching Service Daily Progress Note Intern Pager: [phone]  Patient name: Ruth Douglas Medical record number: [id number] Date of birth: 07-26-1940 Age: 74 y.o. Gender: female  Primary Care Provider: No PCP Per Patient Consultants: Urology Code Status: Full code  Pt Overview and Major Events to Date:  6/10 - Admit; Urology performed cystoscopy, left retrograde pyelogram, left ureteral stent placement  Assessment and Plan: Ruth Douglas is a 74 y.o. female presenting with LLQ abd pain, N/V . PMH is significant for uncontrolled T2DM s/p b/l toe amputations, HTN, multiple UTIs.  Infected, obstructing 40mm L ureteric calculus with associated hydroureteronephrosis  - Doing well this AM.  - Urology following; Successful cystoscopy and stent placement.  - Foley in place. Patient does have some pain from foley. Defer to Urology when D/C foley is appropriate.  - Abx: Ceftriaxone (6/10>>) - Urine culture pending. - Pain control: Transitioned to Vicodin Q6PRN today. - Zofran q6h prn for N/V  AKI on CKD III: Cr 3.16 on admission (baseline ~1.5).  - Improving with IV fluids, creatinine 2.05.  Continue NS @ 125 mL/hr.  - Will continue to monitor closely.   T2DM: uncontrolled, last A1c 11.9 (06/19/14).  - CBG's fairly well controlled. Fasting 146 this am. - Continuing Sensitive SSI; Will adjust Lantus based on CBG's today (currently on 12 units).   HTN: Normotensive this am.  - Will monitor closely. - Continuing to hold home Norvasc.   FEN/GI: Clear liquids; Advance as tolerated. NS @ 125 mL/hr PPx: SCD's. Starting heparin today.   Disposition: Pending clinical improvement.   Subjective:  Complaining of pain/burning from catheter. Otherwise feeling well. No abdominal pain, N/V. Afebrile overnight.  Tolerating clears.   Objective: Temp:  [97.2 F (36.2 C)-99.1 F (37.3 C)] 99.1 F (37.3 C) (06/11 0609) Pulse Rate:  [90-103] 102 (06/11 0609) Resp:  [12-19] 18  (06/11 0609) BP: (115-153)/(44-106) 127/54 mmHg (06/11 0609) SpO2:  [90 %-100 %] 99 % (06/11 0609) Weight:  [147 lb (66.679 kg)] 147 lb (66.679 kg) (06/10 1100)  Physical Exam: General: appears well, NAD.  Cardiovascular: Tachycardic. Soft systolic murmur.  Respiratory: CTAB. No rales, rhonchi, or wheezing.  Abdomen: soft, nontender, nondistended. No organomegaly.   Laboratory:  Recent Labs Lab 08/08/14 1220 08/09/14 0427  WBC 6.2 6.2  HGB 10.5* 9.3*  HCT 31.5* 28.5*  PLT 188 164    Recent Labs Lab 08/08/14 1220 08/09/14 0427  NA 133* 139  K 3.7 3.8  CL 100* 107  CO2 21* 23  BUN 35* 21*  CREATININE 3.16* 2.05*  CALCIUM 8.9 8.0*  PROT 7.3  --   BILITOT 0.6  --   ALKPHOS 60  --   ALT 10*  --   AST 17  --   GLUCOSE 186* 146*   Urinalysis    Component Value Date/Time   COLORURINE YELLOW 08/08/2014 1242   APPEARANCEUR TURBID* 08/08/2014 1242   LABSPEC 1.017 08/08/2014 1242   PHURINE 5.5 08/08/2014 1242   GLUCOSEU NEGATIVE 08/08/2014 1242   HGBUR MODERATE* 08/08/2014 1242   BILIRUBINUR NEGATIVE 08/08/2014 1242   KETONESUR NEGATIVE 08/08/2014 1242   PROTEINUR 100* 08/08/2014 1242   UROBILINOGEN 0.2 08/08/2014 1242   NITRITE NEGATIVE 08/08/2014 1242   LEUKOCYTESUR LARGE* 08/08/2014 1242   Imaging/Diagnostic Tests: Dg Cystogram 08/08/2014   IMPRESSION: Fluoroscopic spot images during cystogram with left ureteral stent placement.   Ct Renal Stone Study 08/08/2014   IMPRESSION: 1. Left-sided hydroureteronephrosis secondary to a 9 mm mid left ureteric calculus. 2. Left  nephrolithiasis. 3. Bladder wall thickening and surrounding edema, consistent with cystitis. 4. Mild pelvic floor laxity.    Coral Spikes, DO 08/09/2014, 6:14 AM PGY-3 [place] Intern pager: [phone], text pages welcome

## 2014-08-09 NOTE — Evaluation (Signed)
Physical Therapy Evaluation Patient Details Name: Ruth Douglas MRN: [id number] DOB: May 22, 1940 Today's Date: 08/09/2014   History of Present Illness  admitted with LLQ abd pain, N/V . PMH is significant for uncontrolled T2DM s/p b/l toe amputations, HTN, multiple UTIs.. Patient with Infected, obstructing ureteric calculus with associated hydroureteronephrosis  Clinical Impression  Patient overall did well with mobility - requiring only min guard assist for balance.  Patient did not use assistive device prior to admission.  Recommend PT to continue to progress mobility and independence for return to baseline.      Follow Up Recommendations No PT follow up    Equipment Recommendations  None recommended by PT    Recommendations for Other Services       Precautions / Restrictions Precautions Precautions: Fall Restrictions Weight Bearing Restrictions: No      Mobility  Bed Mobility Overal bed mobility: Modified Independent             General bed mobility comments: used railing  Transfers Overall transfer level: Needs assistance Equipment used: Rolling walker (2 wheeled) Transfers: Sit to/from Stand Sit to Stand: Min guard         General transfer comment: for balance due to bilateral toe amputations  Ambulation/Gait Ambulation/Gait assistance: Min guard Ambulation Distance (Feet): 25 Feet Assistive device: Rolling walker (2 wheeled) Gait Pattern/deviations: Step-to pattern;Wide base of support;Shuffle Gait velocity: decreased   General Gait Details: safety for balance due to bilateral toe amputations  Stairs            Wheelchair Mobility    Modified Rankin (Stroke Patients Only)       Balance Overall balance assessment: Needs assistance Sitting-balance support: No upper extremity supported Sitting balance-Leahy Scale: Good     Standing balance support: Bilateral upper extremity supported Standing balance-Leahy Scale: Poor                                Pertinent Vitals/Pain Pain Assessment: 0-10 Pain Score: 4  Pain Location: at catheter insertion Pain Descriptors / Indicators: Sharp Pain Intervention(s): Limited activity within patient's tolerance;Monitored during session    Home Living Family/patient expects to be discharged to:: Private residence Living Arrangements: Children Available Help at Discharge: Family;Available 24 hours/day Type of Home: House Home Access: Stairs to enter Entrance Stairs-Rails: Right Entrance Stairs-Number of Steps: 2 Home Layout: One level Home Equipment: Walker - 2 wheels;Cane - single point;Wheelchair - manual      Prior Function Level of Independence: Independent         Comments: No longer drives. Community ambulator     Hand Dominance   Dominant Hand: Right    Extremity/Trunk Assessment   Upper Extremity Assessment: Generalized weakness           Lower Extremity Assessment: Generalized weakness      Cervical / Trunk Assessment: Normal  Communication   Communication: No difficulties  Cognition Arousal/Alertness: Awake/alert Behavior During Therapy: WFL for tasks assessed/performed Overall Cognitive Status: Within Functional Limits for tasks assessed                      General Comments      Exercises        Assessment/Plan    PT Assessment Patient needs continued PT services  PT Diagnosis Generalized weakness   PT Problem List Decreased activity tolerance;Decreased balance;Decreased mobility  PT Treatment Interventions DME instruction;Gait training;Functional mobility training;Therapeutic activities;Patient/family education  PT Goals (Current goals can be found in the Care Plan section) Acute Rehab PT Goals Patient Stated Goal: feel better PT Goal Formulation: With patient Time For Goal Achievement: 08/09/14 Potential to Achieve Goals: Good    Frequency Min 3X/week   Barriers to discharge        Co-evaluation                End of Session Equipment Utilized During Treatment: Oxygen Activity Tolerance: Patient tolerated treatment well Patient left: in chair;with call bell/phone within reach           Time: 3875-6433 PT Time Calculation (min) (ACUTE ONLY): 21 min   Charges:   PT Evaluation $Initial PT Evaluation Tier I: 1 Procedure     PT G CodesShanna Cisco 08/09/2014, 1:53 PM  08/09/2014 [place], [place]

## 2014-08-09 NOTE — Evaluation (Signed)
Occupational Therapy Evaluation Patient Details Name: Ruth Douglas MRN: [id number] DOB: [birth year] Today's Date: 08/09/2014    History of Present Illness admitted with LLQ abd pain, N/V . PMH is significant for uncontrolled T2DM s/p b/l toe amputations, HTN, multiple UTIs.. Patient with Infected, obstructing ureteric calculus with associated hydroureteronephrosis   Clinical Impression   PTA pt lived at home and was independent with ADLs. Pt is currently limited by pain and impaired balance as well as nausea and required min guard assist to perform functional mobility and LB ADLs. Pt will benefit from acute OT to progress to Mod I level to return home with family support.     Follow Up Recommendations  No OT follow up    Equipment Recommendations  None recommended by OT    Recommendations for Other Services       Precautions / Restrictions Precautions Precautions: Fall Restrictions Weight Bearing Restrictions: No      Mobility Bed Mobility Overal bed mobility: Modified Independent             General bed mobility comments: used railing  Transfers Overall transfer level: Needs assistance Equipment used: Rolling walker (2 wheeled) Transfers: Sit to/from Stand Sit to Stand: Min guard         General transfer comment: for balance due to bilateral toe amputations    Balance Overall balance assessment: Needs assistance Sitting-balance support: No upper extremity supported Sitting balance-Leahy Scale: Good     Standing balance support: Bilateral upper extremity supported Standing balance-Leahy Scale: Poor                              ADL Overall ADL's : Needs assistance/impaired Eating/Feeding: Independent;Sitting   Grooming: Set up;Sitting   Upper Body Bathing: Set up;Sitting   Lower Body Bathing: Min guard;Sit to/from stand   Upper Body Dressing : Set up;Sitting   Lower Body Dressing: Minimal assistance;Sit to/from stand Lower Body  Dressing Details (indicate cue type and reason): due to pain at catheter site Toilet Transfer: Supervision/safety;Stand-pivot;RW Toilet Transfer Details (indicate cue type and reason): recliner>bed         Functional mobility during ADLs: Min guard;Rolling walker General ADL Comments: Pt limited by nausea and requested assistance to return to bed.      Vision Additional Comments: No change from baseline          Pertinent Vitals/Pain Pain Assessment: 0-10 Pain Score: 5  Pain Location: catheter Pain Descriptors / Indicators: Sharp;Shooting Pain Intervention(s): Limited activity within patient's tolerance;Monitored during session;Repositioned     Hand Dominance Right   Extremity/Trunk Assessment Upper Extremity Assessment Upper Extremity Assessment: Generalized weakness   Lower Extremity Assessment Lower Extremity Assessment: Generalized weakness   Cervical / Trunk Assessment Cervical / Trunk Assessment: Normal   Communication Communication Communication: No difficulties   Cognition Arousal/Alertness: Awake/alert Behavior During Therapy: WFL for tasks assessed/performed Overall Cognitive Status: Within Functional Limits for tasks assessed                                Home Living Family/patient expects to be discharged to:: Private residence Living Arrangements: Children Available Help at Discharge: Family;Available 24 hours/day Type of Home: House Home Access: Stairs to enter [employer] of Steps: 2 Entrance Stairs-Rails: Right Home Layout: One level     Bathroom Shower/Tub: Occupational psychologist: Standard     Home Equipment: Environmental consultant -  2 wheels;Cane - single point;Wheelchair - manual          Prior Functioning/Environment Level of Independence: Independent        Comments: No longer drives. Community ambulator    OT Diagnosis: Generalized weakness;Acute pain   OT Problem List: Decreased strength;Decreased  activity tolerance;Impaired balance (sitting and/or standing);Pain   OT Treatment/Interventions: Self-care/ADL training;Therapeutic exercise;Energy conservation;DME and/or AE instruction;Therapeutic activities;Patient/family education;Balance training    OT Goals(Current goals can be found in the care plan section) Acute Rehab OT Goals Patient Stated Goal: feel better OT Goal Formulation: With patient Time For Goal Achievement: 08/23/14 Potential to Achieve Goals: Good ADL Goals Pt Will Perform Lower Body Bathing: with modified independence;sit to/from stand Pt Will Perform Lower Body Dressing: with modified independence;sit to/from stand Pt Will Transfer to Toilet: with modified independence;ambulating Pt Will Perform Toileting - Clothing Manipulation and hygiene: with modified independence;sit to/from stand  OT Frequency: Min 2X/week    End of Session Equipment Utilized During Treatment: Gait belt;Rolling walker  Activity Tolerance: Patient tolerated treatment well Patient left: in bed;with call bell/phone within reach;with bed alarm set   Time: 7591-6384 OT Time Calculation (min): 18 min Charges:  OT General Charges $OT Visit: 1 Procedure OT Evaluation $Initial OT Evaluation Tier I: 1 Procedure G-Codes:    Juluis Rainier 2014/09/01, 4:33 PM   Cyndie Chime, OTR/L Occupational Therapist [phone] (pager)

## 2014-08-10 LAB — CBC
HCT: 25.2 % — ABNORMAL LOW (ref 36.0–46.0)
HEMOGLOBIN: 8.3 g/dL — AB (ref 12.0–15.0)
MCH: 27 pg (ref 26.0–34.0)
MCHC: 32.9 g/dL (ref 30.0–36.0)
MCV: 82.1 fL (ref 78.0–100.0)
PLATELETS: 121 10*3/uL — AB (ref 150–400)
RBC: 3.07 MIL/uL — AB (ref 3.87–5.11)
RDW: 13.8 % (ref 11.5–15.5)
WBC: 6.3 10*3/uL (ref 4.0–10.5)

## 2014-08-10 LAB — GLUCOSE, CAPILLARY
GLUCOSE-CAPILLARY: 88 mg/dL (ref 65–99)
GLUCOSE-CAPILLARY: 170 mg/dL — AB (ref 65–99)
Glucose-Capillary: 148 mg/dL — ABNORMAL HIGH (ref 65–99)
Glucose-Capillary: 174 mg/dL — ABNORMAL HIGH (ref 65–99)

## 2014-08-10 LAB — BASIC METABOLIC PANEL
ANION GAP: 10 (ref 5–15)
BUN: 11 mg/dL (ref 6–20)
CALCIUM: 7.3 mg/dL — AB (ref 8.9–10.3)
CHLORIDE: 104 mmol/L (ref 101–111)
CO2: 22 mmol/L (ref 22–32)
CREATININE: 1.31 mg/dL — AB (ref 0.44–1.00)
GFR calc Af Amer: 46 mL/min — ABNORMAL LOW (ref >60)
GFR calc non Af Amer: 40 mL/min — ABNORMAL LOW (ref >60)
GLUCOSE: 101 mg/dL — AB (ref 65–99)
POTASSIUM: 3 mmol/L — AB (ref 3.5–5.1)
Sodium: 136 mmol/L (ref 135–145)

## 2014-08-10 LAB — URINE CULTURE
Colony Count: NO GROWTH
Culture: NO GROWTH

## 2014-08-10 MED ORDER — POTASSIUM CHLORIDE CRYS ER 20 MEQ PO TBCR
40.0000 meq | EXTENDED_RELEASE_TABLET | Freq: Three times a day (TID) | ORAL | Status: AC
  Administered 2014-08-10 (×3): 40 meq via ORAL
  Filled 2014-08-10 (×3): qty 2

## 2014-08-10 MED ORDER — ACETAMINOPHEN 500 MG PO TABS
500.0000 mg | ORAL_TABLET | Freq: Once | ORAL | Status: AC
  Administered 2014-08-10: 500 mg via ORAL
  Filled 2014-08-10: qty 1

## 2014-08-10 MED ORDER — CEPHALEXIN 500 MG PO CAPS
500.0000 mg | ORAL_CAPSULE | Freq: Four times a day (QID) | ORAL | Status: DC
  Filled 2014-08-10 (×3): qty 1

## 2014-08-10 NOTE — Progress Notes (Signed)
2 Days Post-Op Subjective: Patient reports she wants foley out. Doing well.   Objective: Vital signs in last 24 hours: Temp:  [98.9 F (37.2 C)-102.8 F (39.3 C)] 99.1 F (37.3 C) (06/12 0901) Pulse Rate:  [81-111] 81 (06/12 0901) Resp:  [14-18] 17 (06/12 0901) BP: (111-132)/(45-98) 124/56 mmHg (06/12 0901) SpO2:  [96 %-99 %] 97 % (06/12 0901) Weight:  [68.947 kg (152 lb)] 68.947 kg (152 lb) (06/11 2057)  Intake/Output from previous day: 06/11 0701 - 06/12 0700 In: 4370 [P.O.:1320; I.V.:3000; IV Piggyback:50] Out: 1350 [Urine:1350] Intake/Output this shift: Total I/O In: 822 [P.O.:822] Out: 800 [Urine:800]  Physical Exam:  She looks well.  Urine clear   Lab Results:  Recent Labs  08/08/14 1220 08/09/14 0427 08/10/14 1149  HGB 10.5* 9.3* 8.3*  HCT 31.5* 28.5* 25.2*   BMET  Recent Labs  08/09/14 0427 08/10/14 0550  NA 139 136  K 3.8 3.0*  CL 107 104  CO2 23 22  GLUCOSE 146* 101*  BUN 21* 11  CREATININE 2.05* 1.31*  CALCIUM 8.0* 7.3*   No results for input(s): LABPT, INR in the last 72 hours. No results for input(s): LABURIN in the last 72 hours. Results for orders placed or performed during the hospital encounter of 08/08/14  Urine culture     Status: None   Collection Time: 08/08/14 12:42 PM  Result Value Ref Range Status   Specimen Description URINE, RANDOM  Final   Special Requests NONE  Final   Colony Count NO GROWTH Performed at [facility]   Final   Culture NO GROWTH Performed at [facility]   Final   Report Status 08/09/2014 FINAL  Final  Urine culture     Status: None   Collection Time: 08/08/14  9:09 PM  Result Value Ref Range Status   Specimen Description URINE, RANDOM  Final   Special Requests BLADDER  Final   Colony Count NO GROWTH Performed at [facility]   Final   Culture NO GROWTH Performed at [facility]   Final   Report Status 08/10/2014 FINAL  Final    Studies/Results: Dg  Cystogram  08/08/2014   CLINICAL DATA:  Cystogram  EXAM: CYSTOGRAM  TECHNIQUE: After catheterization of the urinary bladder following sterile technique the bladder was filled with Cysto-Hypaque 30% by drip infusion. Serial spot images were obtained during bladder filling and post draining.  FLUOROSCOPY TIME:  3 minutes 26 seconds  COMPARISON:  CT abdomen pelvis dated 08/08/2014  FINDINGS: Fluoroscopic spot images during retrograde opacification of the left ureter demonstrates a left ureteral calculus.  2nd spot image demonstrates a mildly dilated left renal collecting system.  3rd image demonstrates a left ureteral stent.  Please refer to the referring physician's notes for additional information regarding the procedure.  IMPRESSION: Fluoroscopic spot images during cystogram with left ureteral stent placement.   Electronically Signed   By: Julian Hy M.D.   On: 08/08/2014 21:42    Assessment/Plan:  Left ureteral stone - urine Cx's negative. D/c foley. I'll arrange stone treatment in a few weeks. I would rec to cover with empiric abx for 10 days - cipro, bactrim, cephalexin, etc.    LOS: 2 days   Ruth Douglas 08/10/2014, 2:33 PM

## 2014-08-10 NOTE — Progress Notes (Signed)
Family Medicine Teaching Service Daily Progress Note Intern Pager: [phone]  Patient name: Ruth Douglas Medical record number: [id number] Date of birth: 06/20/1940 Age: 74 y.o. Gender: female  Primary Care Provider: No PCP Per Patient Consultants: Urology Code Status: Full code  Pt Overview and Major Events to Date:  6/10 - Admit; Urology performed cystoscopy, left retrograde pyelogram, left ureteral stent placement 6/12: Temp 101 @ 5am;   Assessment and Plan: Ruth Douglas is a 74 y.o. female presenting with LLQ abd pain, N/V . PMH is significant for uncontrolled T2DM s/p b/l toe amputations, HTN, multiple UTIs.  Infected, obstructing 65mm L ureteric calculus with associated hydroureteronephrosis  - Urology following; Successful cystoscopy and stent placement.  - Foley in place. Patient does have some pain from foley. Defer to Urology when D/C foley is appropriate.  - Abx: Tday 3. Ceftriaxone (6/10>>) - Urine culture: NGTD (Collected 6/10) - Pain control: Vicodin Q6PRN - Zofran q6h prn for N/V - D/c oxybutynin 6/12   AKI on CKD III: Cr 3.16 on admission (baseline ~1.5).  - Improving with IV fluids, creatinine 1.31 (6/12)  Continue NS @ 125 mL/hr.  - Will continue to monitor closely.   Hypokalemia: 3.0 (6/12) - replace with Kdur 40 TID x 1 day  T2DM: uncontrolled, last A1c 11.9 (06/19/14).  - CBG's fairly well controlled. Fasting 146 this am. - Continuing Sensitive SSI - Lantus: 12u - Will adjust Lantus based on CBG's (home dose 24 units).   HTN: Normotensive this am.  - Will monitor closely - Continuing to hold home Norvasc.   FEN/GI: Carb Mod; KVO PPx:  heparin   Disposition: Pending clinical improvement.   Subjective:  Reports pain due to Foley, but otherwise denies ab pain or flank pain. Able to walk yesterday w/o difficulties. Eating and drinking well.   Objective: Temp:  [98.9 F (37.2 C)-102.8 F (39.3 C)] 99 F (37.2 C) (06/12 3009) Pulse Rate:   [87-111] 89 (06/12 0503) Resp:  [14-18] 18 (06/12 0503) BP: (111-132)/(45-98) 132/48 mmHg (06/12 0503) SpO2:  [96 %-100 %] 96 % (06/12 0503) Weight:  [152 lb (68.947 kg)] 152 lb (68.947 kg) (06/11 2057)  Physical Exam: General: appears well, NAD.  Cardiovascular: RRR Soft systolic murmur.  Respiratory: CTAB. No rales, rhonchi, or wheezing.  Abdomen: soft, nontender, nondistended. No organomegaly. No CVA tenderness MSK: Bilateral feet with all toes amputated    Laboratory:  Recent Labs Lab 08/08/14 1220 08/09/14 0427  WBC 6.2 6.2  HGB 10.5* 9.3*  HCT 31.5* 28.5*  PLT 188 164    Recent Labs Lab 08/08/14 1220 08/09/14 0427 08/10/14 0550  NA 133* 139 136  K 3.7 3.8 3.0*  CL 100* 107 104  CO2 21* 23 22  BUN 35* 21* 11  CREATININE 3.16* 2.05* 1.31*  CALCIUM 8.9 8.0* 7.3*  PROT 7.3  --   --   BILITOT 0.6  --   --   ALKPHOS 60  --   --   ALT 10*  --   --   AST 17  --   --   GLUCOSE 186* 146* 101*   Urinalysis    Component Value Date/Time   COLORURINE YELLOW 08/08/2014 1242   APPEARANCEUR TURBID* 08/08/2014 1242   LABSPEC 1.017 08/08/2014 1242   PHURINE 5.5 08/08/2014 1242   GLUCOSEU NEGATIVE 08/08/2014 1242   HGBUR MODERATE* 08/08/2014 1242   BILIRUBINUR NEGATIVE 08/08/2014 1242   KETONESUR NEGATIVE 08/08/2014 1242   PROTEINUR 100* 08/08/2014 1242   UROBILINOGEN 0.2 08/08/2014  [place] 08/08/2014 [place] 08/08/2014 1242   Imaging/Diagnostic Tests: Dg Cystogram 08/08/2014   IMPRESSION: Fluoroscopic spot images during cystogram with left ureteral stent placement.   Ct Renal Stone Study 08/08/2014   IMPRESSION: 1. Left-sided hydroureteronephrosis secondary to a 9 mm mid left ureteric calculus. 2. Left nephrolithiasis. 3. Bladder wall thickening and surrounding edema, consistent with cystitis. 4. Mild pelvic floor laxity.    Olam Idler, MD 08/10/2014, 8:06 AM PGY-2 [place] Intern pager: [phone],  text pages welcome

## 2014-08-11 DIAGNOSIS — N189 Chronic kidney disease, unspecified: Secondary | ICD-10-CM

## 2014-08-11 DIAGNOSIS — N390 Urinary tract infection, site not specified: Secondary | ICD-10-CM

## 2014-08-11 DIAGNOSIS — N200 Calculus of kidney: Secondary | ICD-10-CM | POA: Insufficient documentation

## 2014-08-11 LAB — BASIC METABOLIC PANEL
Anion gap: 7 (ref 5–15)
BUN: 9 mg/dL (ref 6–20)
CALCIUM: 7.7 mg/dL — AB (ref 8.9–10.3)
CO2: 24 mmol/L (ref 22–32)
Chloride: 106 mmol/L (ref 101–111)
Creatinine, Ser: 1.14 mg/dL — ABNORMAL HIGH (ref 0.44–1.00)
GFR calc Af Amer: 55 mL/min — ABNORMAL LOW (ref >60)
GFR, EST NON AFRICAN AMERICAN: 47 mL/min — AB (ref >60)
GLUCOSE: 122 mg/dL — AB (ref 65–99)
Potassium: 4.2 mmol/L (ref 3.5–5.1)
Sodium: 137 mmol/L (ref 135–145)

## 2014-08-11 LAB — CBC
HCT: 27.4 % — ABNORMAL LOW (ref 36.0–46.0)
HEMOGLOBIN: 9.1 g/dL — AB (ref 12.0–15.0)
MCH: 26.5 pg (ref 26.0–34.0)
MCHC: 33.2 g/dL (ref 30.0–36.0)
MCV: 79.9 fL (ref 78.0–100.0)
Platelets: 134 10*3/uL — ABNORMAL LOW (ref 150–400)
RBC: 3.43 MIL/uL — ABNORMAL LOW (ref 3.87–5.11)
RDW: 13.4 % (ref 11.5–15.5)
WBC: 7.1 10*3/uL (ref 4.0–10.5)

## 2014-08-11 LAB — GLUCOSE, CAPILLARY
GLUCOSE-CAPILLARY: 138 mg/dL — AB (ref 65–99)
Glucose-Capillary: 121 mg/dL — ABNORMAL HIGH (ref 65–99)

## 2014-08-11 MED ORDER — CEPHALEXIN 500 MG PO CAPS: 500.0000 mg | ORAL_CAPSULE | Freq: Four times a day (QID) | ORAL | Status: DC

## 2014-08-11 MED ORDER — INSULIN GLARGINE 100 UNIT/ML ~~LOC~~ SOLN: 15.0000 [IU] | Freq: Every day | SUBCUTANEOUS | Status: DC

## 2014-08-11 NOTE — Progress Notes (Signed)
Ruth Douglas to be D/C'd Home per MD order.  Discussed prescriptions and follow up appointments with the patient. Prescriptions given to patient, medication list explained in detail. Pt verbalized understanding.    Medication List    STOP taking these medications        amLODipine 5 MG tablet  Commonly known as:  NORVASC      TAKE these medications        acetaminophen 500 MG tablet  Commonly known as:  TYLENOL  Take 500 mg by mouth every 6 (six) hours as needed for mild pain.     cephALEXin 500 MG capsule  Commonly known as:  KEFLEX  Take 1 capsule (500 mg total) by mouth every 6 (six) hours.     CRANBERRY PO  Take 1 capsule by mouth daily.     ferrous sulfate 325 (65 FE) MG tablet  Take 1 tablet (325 mg total) by mouth 2 (two) times daily with a meal.     insulin glargine 100 UNIT/ML injection  Commonly known as:  LANTUS  Inject 0.15 mLs (15 Units total) into the skin at bedtime.        Filed Vitals:   08/11/14 0536  BP: 137/51  Pulse: 103  Temp: 98.5 F (36.9 C)  Resp: 16    Skin clean, dry and intact without evidence of skin break down, no evidence of skin tears noted. IV catheter discontinued intact. Site without signs and symptoms of complications. Dressing and pressure applied. Pt denies pain at this time. No complaints noted.  An After Visit Summary was printed and given to the patient. Patient escorted via [place], and D/C home via private auto.  Hargun Spurling A 08/11/2014 6:51 PM

## 2014-08-11 NOTE — Discharge Summary (Signed)
[facility] Discharge Summary  Patient name: Ruth Douglas Medical record number: [id number] Date of birth: March 27, 1940 Age: 74 y.o. Gender: female Date of Admission: 08/08/2014  Date of Discharge: 08/11/14 Admitting Physician: Lind Covert, MD  Primary Care Provider: No PCP Per Patient Consultants: urology  Indication for Hospitalization: LLQ pain and nausea and vomiting  Discharge Diagnoses/Problem List:  T2DM L renal calculus Acute on chronic kidney disease HTN  Disposition: Discharge home with close follow up with urology  Discharge Condition: Stable  Discharge Exam:  Blood pressure 137/51, pulse 103, temperature 98.5 F (36.9 C), temperature source Oral, resp. rate 16, height 5\' 3"  (1.6 m), weight 152 lb (68.947 kg), SpO2 96 %. Gen: awake, alert, well appearing, NAD HEENT: [place]/AT, EOMI, MMM Cardio: RRR, no murmurs Pulm: CTAB, no increased WOB Abd: soft, NT/ND, +BS Ext: WWP, no edema Spine: no CVA TTP, no bony deformities Neuro: no focal deficits, follows commands Psych: good eye contact, speech normal, mood stable  Brief Hospital Course:  Ruth Douglas is a 74 y.o. female that presented with LLQ abd pain, N/V . PMH is significant for uncontrolled T2DM s/p b/l toe amputations, HTN, multiple UTIs.  In the ED, CT renal stone study was obtained and showed L sided hydroureteronephrosis with a 72mm ureteric calculus, L nephrolithiasis and bladder wall thickening.  UA with many bacteria, moderate Hgb, large leuks, TNTC WBCs.  Urine culture was negative for growth.  Lipase was mildly elevated.  Patient was afebrile, had no leukocytosis and vitals were stable.  Urology was consulted who recommended stent placement that evening.  Patient tolerated procedure well.  Her pain was controlled with IV pain medication.  She was also treated with IV antibiotics and transitioned to keflex. Patient evaluated by PT/OT, who had no outpatient  recommendations.  Patient also noted to be hyponatremic to 133 on admission.  She also had an AKI in the setting of CKDIII.  Cr 3.16 on admission and was better than her baseline at 1.14 on discharge.  During hospitalization, she was treated with IVFs and nephrotoxic agents were avoided.  He blood sugars were monitored.  He home lantus was decreased from 24 units to 12 units and titrated up as appropriate.  She was discharged on Lantus 15 units daily.   Patient's home Norvasc was held.  Patient remained normotensive and this was not resumed at the time of discharge.  Patient was discharged in stable condition.  Discharge instructions and return precautions were reviewed with patient, who voiced good understanding.  She is to follow up with urology in a couple of weeks for stent and renal stone management.  Issues for Follow Up:  1. L hydroureteronephrosis/ureteral calculus.  To be followed by urology outpatient 2. CBGs, Lantus decreased to 15 units daily at discharge 3. Patient to continue Keflex for 7 more days 4. Blood pressure.  Norvasc discontinued at discharge in the setting of normotension.  Please evaluate need for this medication.  Significant Procedures: L ureteral stent placement  Significant Labs and Imaging:   Recent Labs Lab 08/09/14 0427 08/10/14 1149 08/11/14 0613  WBC 6.2 6.3 7.1  HGB 9.3* 8.3* 9.1*  HCT 28.5* 25.2* 27.4*  PLT 164 121* 134*    Recent Labs Lab 08/08/14 1220 08/09/14 0427 08/10/14 0550 08/11/14 0613  NA 133* 139 136 137  K 3.7 3.8 3.0* 4.2  CL 100* 107 104 106  CO2 21* 23 22 24   GLUCOSE 186* 146* 101* 122*  BUN 35* 21* 11  9  CREATININE 3.16* 2.05* 1.31* 1.14*  CALCIUM 8.9 8.0* 7.3* 7.7*  ALKPHOS 60  --   --   --   AST 17  --   --   --   ALT 10*  --   --   --   ALBUMIN 3.7  --   --   --    Urinalysis    Component Value Date/Time   COLORURINE YELLOW 08/08/2014 1242   APPEARANCEUR TURBID* 08/08/2014 1242   LABSPEC 1.017 08/08/2014  1242   PHURINE 5.5 08/08/2014 1242   GLUCOSEU NEGATIVE 08/08/2014 1242   HGBUR MODERATE* 08/08/2014 1242   BILIRUBINUR NEGATIVE 08/08/2014 1242   KETONESUR NEGATIVE 08/08/2014 1242   PROTEINUR 100* 08/08/2014 1242   UROBILINOGEN 0.2 08/08/2014 1242   NITRITE NEGATIVE 08/08/2014 1242   LEUKOCYTESUR LARGE* 08/08/2014 1242   Urine culture: no growth  Dg Cystogram  08/08/2014   CLINICAL DATA:  Cystogram  EXAM: CYSTOGRAM  TECHNIQUE: After catheterization of the urinary bladder following sterile technique the bladder was filled with Cysto-Hypaque 30% by drip infusion. Serial spot images were obtained during bladder filling and post draining.  FLUOROSCOPY TIME:  3 minutes 26 seconds  COMPARISON:  CT abdomen pelvis dated 08/08/2014  FINDINGS: Fluoroscopic spot images during retrograde opacification of the left ureter demonstrates a left ureteral calculus.  2nd spot image demonstrates a mildly dilated left renal collecting system.  3rd image demonstrates a left ureteral stent.  Please refer to the referring physician's notes for additional information regarding the procedure.  IMPRESSION: Fluoroscopic spot images during cystogram with left ureteral stent placement.   Electronically Signed   By: Julian Hy M.D.   On: 08/08/2014 21:42   Ct Renal Stone Study  08/08/2014   CLINICAL DATA:  Acute left flank pain for 1 day. History kidney stones. prior C-section and tubal ligation.  EXAM: CT ABDOMEN AND PELVIS WITHOUT CONTRAST  TECHNIQUE: Multidetector CT imaging of the abdomen and pelvis was performed following the standard protocol without IV contrast.  COMPARISON:  06/19/2014 ultrasound.  11/20/2012 CT  FINDINGS: Lower chest: Mild motion degradation. Small hiatal hernia. Normal heart size, without pericardial effusion. Multivessel coronary artery atherosclerosis.  Hepatobiliary: Normal liver. Normal gallbladder, without biliary ductal dilatation.  Pancreas: Normal, without mass or ductal dilatation.   Spleen: Normal  Adrenals/Urinary Tract: Normal adrenal glands. Renal cortical thinning bilaterally. Punctate upper pole left renal collecting system calculus. Upper pole right renal low-density lesions which are likely cysts. There may be a second lower pole left renal collecting system calculus. Moderate left-sided hydroureteronephrosis, to the level of a mid left ureteric 8 x 9 mm calculus. Moderate bladder wall thickening and surrounding edema.  Stomach/Bowel: Otherwise normal appearance of the stomach. Extensive colonic diverticulosis. Normal terminal ileum and appendix. Normal small bowel.  Vascular/Lymphatic: Aortic and branch vessel atherosclerosis. No abdominopelvic adenopathy.  Reproductive: Dystrophic calcification in the uterus could relate to an underlying fibroid. No adnexal mass.  Other: No significant free fluid.  Mild pelvic floor laxity.  Musculoskeletal: Osteopenia.  Multilevel disc bulges.  IMPRESSION: 1. Left-sided hydroureteronephrosis secondary to a 9 mm mid left ureteric calculus. 2. Left nephrolithiasis. 3. Bladder wall thickening and surrounding edema, consistent with cystitis. 4. Mild pelvic floor laxity.   Electronically Signed   By: Abigail Miyamoto M.D.   On: 08/08/2014 14:12   Results/Tests Pending at Time of Discharge: none  Discharge Medications:    Medication List    STOP taking these medications  amLODipine 5 MG tablet  Commonly known as:  NORVASC      TAKE these medications        acetaminophen 500 MG tablet  Commonly known as:  TYLENOL  Take 500 mg by mouth every 6 (six) hours as needed for mild pain.     cephALEXin 500 MG capsule  Commonly known as:  KEFLEX  Take 1 capsule (500 mg total) by mouth every 6 (six) hours.     CRANBERRY PO  Take 1 capsule by mouth daily.     ferrous sulfate 325 (65 FE) MG tablet  Take 1 tablet (325 mg total) by mouth 2 (two) times daily with a meal.     insulin glargine 100 UNIT/ML injection  Commonly known as:  LANTUS   Inject 0.15 mLs (15 Units total) into the skin at bedtime.        Discharge Instructions: Please refer to Patient Instructions section of EMR for full details.  Patient was counseled important signs and symptoms that should prompt return to medical care, changes in medications, dietary instructions, activity restrictions, and follow up appointments.   Follow-Up Appointments: Follow-up Information    Follow up with ESKRIDGE, MATTHEW, MD In 2 weeks.   Specialty:  Urology   Contact information:   [place] [address] [phone]       Janora Norlander, DO 08/11/2014, 3:45 PM PGY-1, [place]

## 2014-08-11 NOTE — Progress Notes (Signed)
Physical Therapy Treatment and Discharge Patient Details Name: Ruth Douglas MRN: [id number] DOB: Aug 29, 1940 Today's Date: 08/11/2014    History of Present Illness admitted with LLQ abd pain, N/V . PMH is significant for uncontrolled T2DM s/p b/l toe amputations, HTN, multiple UTIs.. Patient with Infected, obstructing ureteric calculus with associated hydroureteronephrosis    PT Comments    Ambulation distance was increased to 300 feet, and pt climbed 4 stairs using a cane with modified independence. Pt tolerated treatment well and did not complain of any pain or symptoms of fatigue. Pt is appropriate to d/c from acute care PT, and safe to d/c to home. Recommended follow up with prosthetist to get a better fitted shoe to improve balance and functional mobility. No further PT recommendations.  Follow Up Recommendations  No PT follow up     Equipment Recommendations  None recommended by PT    Recommendations for Other Services       Precautions / Restrictions Precautions Precautions: Fall Restrictions Weight Bearing Restrictions: No    Mobility  Bed Mobility Overal bed mobility: Modified Independent             General bed mobility comments: used railing  Transfers Overall transfer level: Needs assistance Equipment used: Straight cane Transfers: Sit to/from Stand Sit to Stand: Modified independent (Device/Increase time)         General transfer comment: Performed sit-to-stand in bathroom modified independently. Used hand railing for support.  Ambulation/Gait Ambulation/Gait assistance: Modified independent (Device/Increase time) Ambulation Distance (Feet): 300 Feet Assistive device: Straight cane Gait Pattern/deviations: Step-to pattern;Shuffle;Wide base of support Gait velocity: decreased Gait velocity interpretation: Below normal speed for age/gender General Gait Details: Used cane as extra base of support.   Stairs Stairs: Yes Stairs assistance:  Modified independent (Device/Increase time) Stair Management: One rail Right;Step to pattern;Forwards;With cane Number of Stairs: 4 General stair comments: Limited stair climbing to 4 stairs due to IV pole.   Wheelchair Mobility    Modified Rankin (Stroke Patients Only)       Balance Overall balance assessment: Modified Independent Sitting-balance support: No upper extremity supported Sitting balance-Leahy Scale: Good     Standing balance support: Single extremity supported Standing balance-Leahy Scale: Good                      Cognition Arousal/Alertness: Awake/alert Behavior During Therapy: WFL for tasks assessed/performed Overall Cognitive Status: Within Functional Limits for tasks assessed                      Exercises      General Comments General comments (skin integrity, edema, etc.): Pt complained about urine leaking while walking before the session started. Used a depends adult diaper during the session, but reported no leaking.      Pertinent Vitals/Pain Pain Assessment: No/denies pain    Home Living                      Prior Function            PT Goals (current goals can now be found in the care plan section) Acute Rehab PT Goals Patient Stated Goal: feel better PT Goal Formulation: With patient Time For Goal Achievement: 08/09/14 Potential to Achieve Goals: Good Progress towards PT goals: Goals met/education completed, patient discharged from PT    Frequency  Min 3X/week    PT Plan Current plan remains appropriate    Co-evaluation  End of Session Equipment Utilized During Treatment: Gait belt Activity Tolerance: Patient tolerated treatment well Patient left: in chair;with call bell/phone within reach     Time: 0955-1024 PT Time Calculation (min) (ACUTE ONLY): 29 min  Charges:                       G Codes:      Orlene Plum 2014-09-09, 10:52 AM   Orlene Plum, SPT Acute  Rehabilitation Services Office: [phone]

## 2014-08-11 NOTE — Progress Notes (Signed)
Occupational Therapy Treatment and Discharge Patient Details Name: Ruth Douglas MRN: [id number] DOB: 1941-01-19 Today's Date: 08/11/2014    History of present illness admitted with LLQ abd pain, N/V . PMH is significant for uncontrolled T2DM s/p b/l toe amputations, HTN, multiple UTIs.. Patient with Infected, obstructing ureteric calculus with associated hydroureteronephrosis   OT comments  Pt performing ADL and ADL transfers at an independent to modified independent level.  No further OT needs.  Follow Up Recommendations  No OT follow up    Equipment Recommendations  None recommended by OT    Recommendations for Other Services      Precautions / Restrictions Precautions Precautions: None       Mobility Bed Mobility                  Transfers Overall transfer level: Modified independent Equipment used: Straight cane   Sit to Stand: Modified independent (Device/Increase time)              Balance                                   ADL Overall ADL's : Independent     Grooming: Wash/dry hands;Modified independent;Standing       Lower Body Bathing: Sit to/from stand;Modified independent       Lower Body Dressing: Sit to/from stand;Modified independent   Toilet Transfer: Regular Toilet;Ambulation;Modified Independent           Functional mobility during ADLs: Independent        Vision                     Perception     Praxis      Cognition   Behavior During Therapy: WFL for tasks assessed/performed Overall Cognitive Status: Within Functional Limits for tasks assessed                       Extremity/Trunk Assessment               Exercises     Shoulder Instructions       General Comments      Pertinent Vitals/ Pain       Pain Assessment: No/denies pain  Home Living                                          Prior Functioning/Environment               Frequency Min 2X/week     Progress Toward Goals  OT Goals(current goals can now be found in the care plan section)  Progress towards OT goals: Goals met/education completed, patient discharged from OT  Acute Rehab OT Goals Patient Stated Goal: home today Time For Goal Achievement: 08/23/14  Plan Discharge plan remains appropriate    Co-evaluation                 End of Session     Activity Tolerance Patient tolerated treatment well   Patient Left in chair;with call bell/phone within reach   Nurse Communication          Time: 1437-1450 OT Time Calculation (min): 13 min  Charges: OT General Charges $OT Visit: 1 Procedure OT Treatments $Self Care/Home Management : 8-22 mins  Malka So 08/11/2014, 2:55 PM  [phone]    

## 2014-08-11 NOTE — Discharge Instructions (Signed)
Please make sure to follow up with urology as instructed.  You should also follow up with your PCP in the next week.  Ureteral Stent Implantation, Care After Refer to this sheet in the next few weeks. These instructions provide you with information on caring for yourself after your procedure. Your health care provider may also give you more specific instructions. Your treatment has been planned according to current medical practices, but problems sometimes occur. Call your health care provider if you have any problems or questions after your procedure. WHAT TO EXPECT AFTER THE PROCEDURE You should be back to normal activity within 48 hours after the procedure. Nausea and vomiting may occur and are commonly the result of anesthesia. It is common to experience sharp pain in the back or lower abdomen and penis with voiding. This is caused by movement of the ends of the stent with the act of urinating.It usually goes away within minutes after you have stopped urinating.  HOME CARE INSTRUCTIONS Make sure to drink plenty of fluids. You may have small amounts of bleeding, causing your urine to be red. This is normal. Certain movements may trigger pain or a feeling that you need to urinate. You may be given medicines to prevent infection or bladder spasms. Be sure to take all medicines as directed. Only take over-the-counter or prescription medicines for pain, discomfort, or fever as directed by your health care provider. Do not take aspirin, as this can make bleeding worse.  */*/*/*/*/* The stent will be left in until the stone is removed. We will set this up in a few weeks once you have recovered from the infection. Be sure to keep all follow-up appointments so your health care provider can check that you are healing properly. The stent MUST BE REMOVED or CHANGED. THE STENT IS TEMPORARY.   SEEK MEDICAL CARE IF:  You experience increasing pain.  Your pain medicine is not working. SEEK IMMEDIATE MEDICAL  CARE IF:  Your urine is dark red or has blood clots.  You are leaking urine (incontinent).  You have a fever, chills, feeling sick to your stomach (nausea), or vomiting.  Your pain is not relieved by pain medicine.  The end of the stent comes out of the urethra.  You are unable to urinate.

## 2014-08-12 ENCOUNTER — Encounter (HOSPITAL_COMMUNITY): Payer: Self-pay | Admitting: Urology

## 2014-08-12 NOTE — Progress Notes (Signed)
NEED Clinical ASAP!

## 2014-08-18 ENCOUNTER — Other Ambulatory Visit: Payer: Self-pay | Admitting: Urology

## 2014-08-18 ENCOUNTER — Encounter (HOSPITAL_COMMUNITY): Payer: Self-pay | Admitting: Urology

## 2014-08-24 ENCOUNTER — Emergency Department (HOSPITAL_COMMUNITY)
Admission: EM | Admit: 2014-08-24 | Discharge: 2014-08-24 | Disposition: A | Discharge status: Home / Self Care | Payer: Medicare FFS | Attending: Emergency Medicine | Admitting: Emergency Medicine

## 2014-08-24 DIAGNOSIS — Z79899 Other long term (current) drug therapy: Secondary | ICD-10-CM | POA: Insufficient documentation

## 2014-08-24 DIAGNOSIS — R300 Dysuria: Secondary | ICD-10-CM | POA: Diagnosis present

## 2014-08-24 DIAGNOSIS — E119 Type 2 diabetes mellitus without complications: Secondary | ICD-10-CM | POA: Diagnosis not present

## 2014-08-24 DIAGNOSIS — R102 Pelvic and perineal pain: Secondary | ICD-10-CM | POA: Insufficient documentation

## 2014-08-24 DIAGNOSIS — Z87440 Personal history of urinary (tract) infections: Secondary | ICD-10-CM | POA: Insufficient documentation

## 2014-08-24 DIAGNOSIS — I129 Hypertensive chronic kidney disease with stage 1 through stage 4 chronic kidney disease, or unspecified chronic kidney disease: Secondary | ICD-10-CM | POA: Diagnosis not present

## 2014-08-24 DIAGNOSIS — I509 Heart failure, unspecified: Secondary | ICD-10-CM | POA: Diagnosis not present

## 2014-08-24 DIAGNOSIS — Z794 Long term (current) use of insulin: Secondary | ICD-10-CM | POA: Diagnosis not present

## 2014-08-24 DIAGNOSIS — N189 Chronic kidney disease, unspecified: Secondary | ICD-10-CM | POA: Diagnosis not present

## 2014-08-24 LAB — URINALYSIS, ROUTINE W REFLEX MICROSCOPIC
BILIRUBIN URINE: NEGATIVE
GLUCOSE, UA: NEGATIVE mg/dL
KETONES UR: NEGATIVE mg/dL
Nitrite: NEGATIVE
Protein, ur: >300 mg/dL — AB
SPECIFIC GRAVITY, URINE: 1.015 (ref 1.005–1.030)
Urobilinogen, UA: 0.2 mg/dL (ref 0.0–1.0)
pH: 6.5 (ref 5.0–8.0)

## 2014-08-24 LAB — CBC WITH DIFFERENTIAL/PLATELET
BASOS PCT: 0 % (ref 0–1)
Basophils Absolute: 0 10*3/uL (ref 0.0–0.1)
Eosinophils Absolute: 0.5 10*3/uL (ref 0.0–0.7)
Eosinophils Relative: 7 % — ABNORMAL HIGH (ref 0–5)
HCT: 30.5 % — ABNORMAL LOW (ref 36.0–46.0)
HEMOGLOBIN: 9.7 g/dL — AB (ref 12.0–15.0)
Lymphocytes Relative: 36 % (ref 12–46)
Lymphs Abs: 2.5 10*3/uL (ref 0.7–4.0)
MCH: 26.8 pg (ref 26.0–34.0)
MCHC: 31.8 g/dL (ref 30.0–36.0)
MCV: 84.3 fL (ref 78.0–100.0)
MONO ABS: 0.6 10*3/uL (ref 0.1–1.0)
Monocytes Relative: 9 % (ref 3–12)
Neutro Abs: 3.3 10*3/uL (ref 1.7–7.7)
Neutrophils Relative %: 48 % (ref 43–77)
PLATELETS: 415 10*3/uL — AB (ref 150–400)
RBC: 3.62 MIL/uL — AB (ref 3.87–5.11)
RDW: 13.6 % (ref 11.5–15.5)
WBC: 6.8 10*3/uL (ref 4.0–10.5)

## 2014-08-24 LAB — COMPREHENSIVE METABOLIC PANEL
ALT: 10 U/L — ABNORMAL LOW (ref 14–54)
ANION GAP: 8 (ref 5–15)
AST: 14 U/L — ABNORMAL LOW (ref 15–41)
Albumin: 3.6 g/dL (ref 3.5–5.0)
Alkaline Phosphatase: 67 U/L (ref 38–126)
BILIRUBIN TOTAL: 0.7 mg/dL (ref 0.3–1.2)
BUN: 26 mg/dL — ABNORMAL HIGH (ref 6–20)
CALCIUM: 9.3 mg/dL (ref 8.9–10.3)
CO2: 28 mmol/L (ref 22–32)
Chloride: 101 mmol/L (ref 101–111)
Creatinine, Ser: 1.86 mg/dL — ABNORMAL HIGH (ref 0.44–1.00)
GFR calc Af Amer: 30 mL/min — ABNORMAL LOW (ref >60)
GFR, EST NON AFRICAN AMERICAN: 26 mL/min — AB (ref >60)
Glucose, Bld: 140 mg/dL — ABNORMAL HIGH (ref 65–99)
Potassium: 4.6 mmol/L (ref 3.5–5.1)
SODIUM: 137 mmol/L (ref 135–145)
TOTAL PROTEIN: 7.4 g/dL (ref 6.5–8.1)

## 2014-08-24 LAB — URINE MICROSCOPIC-ADD ON

## 2014-08-24 LAB — WET PREP, GENITAL
Clue Cells Wet Prep HPF POC: NONE SEEN
Trich, Wet Prep: NONE SEEN
WBC, Wet Prep HPF POC: NONE SEEN
Yeast Wet Prep HPF POC: NONE SEEN

## 2014-08-24 MED ORDER — FENTANYL CITRATE (PF) 100 MCG/2ML IJ SOLN
50.0000 ug | Freq: Once | INTRAMUSCULAR | Status: AC
  Administered 2014-08-24: 50 ug via INTRAVENOUS
  Filled 2014-08-24: qty 2

## 2014-08-24 MED ORDER — OXYCODONE-ACETAMINOPHEN 5-325 MG PO TABS: 1.0000 | ORAL_TABLET | Freq: Four times a day (QID) | ORAL | Status: DC | PRN

## 2014-08-24 MED ORDER — DOCUSATE SODIUM 100 MG PO CAPS: 100.0000 mg | ORAL_CAPSULE | Freq: Every day | ORAL | Status: DC | PRN

## 2014-08-24 MED ORDER — SODIUM CHLORIDE 0.9 % IV BOLUS (SEPSIS)
500.0000 mL | Freq: Once | INTRAVENOUS | Status: AC
  Administered 2014-08-24: 500 mL via INTRAVENOUS

## 2014-08-24 NOTE — ED Provider Notes (Signed)
CSN: [id number]     Arrival date & time 08/24/14  1748 History   First MD Initiated Contact with Patient 08/24/14 1911     Chief Complaint  Patient presents with  . Vaginal Pain     (Consider location/radiation/quality/duration/timing/severity/associated sxs/prior Treatment) Patient is a 74 y.o. female presenting with vaginal pain and dysuria. The history is provided by the patient.  Vaginal Pain Pertinent negatives include no chest pain, no abdominal pain, no headaches and no shortness of breath.  Dysuria Pain quality:  Sharp and burning Pain severity:  Moderate Onset quality:  Gradual Duration:  2 months Timing:  Constant Progression:  Worsening Chronicity:  Chronic Recent urinary tract infections: yes   Relieved by:  Nothing Worsened by:  Nothing tried Ineffective treatments:  Antibiotics Associated symptoms: no abdominal pain, no fever, no nausea and no vomiting     Past Medical History  Diagnosis Date  . Renal disorder   . Blood transfusion without reported diagnosis   . Diabetes mellitus without complication   . Hypertension   . CHF (congestive heart failure)   . UTI (lower urinary tract infection)    Past Surgical History  Procedure Laterality Date  . Toe amputation    . Cesarean section    . Cystoscopy with stent placement  01/16/2012    Procedure: CYSTOSCOPY WITH STENT PLACEMENT;  Surgeon: Dutch Gray, MD;  Location: [facility];  Service: Urology;  Laterality: Left;  cysto, retrograde , ureteroscopy , stone extraction with laser lithotripsy, stent placement on left, bladder biopsies with fulgaration  . Holmium laser application  40/98/1191    Procedure: HOLMIUM LASER APPLICATION;  Surgeon: Dutch Gray, MD;  Location: [facility];  Service: Urology;  Laterality: Left;  . Amputation Left 06/01/2012    Procedure: LEFT FOOT TRANSMETATARSEL AMPUTATION;  Surgeon: Wylene Simmer, MD;  Location: [place];  Service: Orthopedics;  Laterality: Left;  . Achilles tendon surgery Left 06/01/2012     Procedure: PERCUTANEOUS TENDON ACHILLES LENGTHING;  Surgeon: Wylene Simmer, MD;  Location: [place];  Service: Orthopedics;  Laterality: Left;  . Amputation Right 06/20/2014    Procedure: AMPUTATION FOOT;  Surgeon: Newt Minion, MD;  Location: [place];  Service: Orthopedics;  Laterality: Right;  . Cystoscopy w/ ureteral stent placement Left 08/08/2014    Procedure: CYSTOSCOPY WITH RETROGRADE PYELOGRAM/URETERAL STENT PLACEMENT;  Surgeon: Festus Aloe, MD;  Location: [place];  Service: Urology;  Laterality: Left;   Family History  Problem Relation Age of Onset  . Diabetes Mellitus II Mother   . Diabetes Mellitus II Father   . Lung cancer Sister   . Kidney cancer Brother    History  Substance Use Topics  . Smoking status: Never Smoker   . Smokeless tobacco: Not on file  . Alcohol Use: No   OB History    No data available     Review of Systems  Constitutional: Negative for fever and fatigue.  HENT: Negative for congestion and drooling.   Eyes: Negative for pain.  Respiratory: Negative for cough and shortness of breath.   Cardiovascular: Negative for chest pain.  Gastrointestinal: Negative for nausea, vomiting, abdominal pain and diarrhea.  Genitourinary: Positive for dysuria and vaginal pain. Negative for hematuria.  Musculoskeletal: Negative for back pain, gait problem and neck pain.  Skin: Negative for color change.  Neurological: Negative for dizziness and headaches.  Hematological: Negative for adenopathy.  Psychiatric/Behavioral: Negative for behavioral problems.  All other systems reviewed and are negative.     Allergies  Review  of patient's allergies indicates no known allergies.  Home Medications   Prior to Admission medications   Medication Sig Start Date End Date Taking? Authorizing Provider  acetaminophen (TYLENOL) 500 MG tablet Take 1,000 mg by mouth every 6 (six) hours as needed for mild pain (pain).    Yes Historical Provider, MD  amLODipine (NORVASC) 5 MG  tablet Take 5 mg by mouth daily. 06/24/14  Yes Historical Provider, MD  cephALEXin (KEFLEX) 500 MG capsule Take 1 capsule (500 mg total) by mouth every 6 (six) hours. Patient taking differently: Take 500 mg by mouth daily.  08/11/14  Yes Ashly M Gottschalk, DO  CRANBERRY PO Take 1 capsule by mouth daily.   Yes Historical Provider, MD  insulin glargine (LANTUS) 100 UNIT/ML injection Inject 0.15 mLs (15 Units total) into the skin at bedtime. 08/11/14  Yes Ashly Windell Moulding, DO  ferrous sulfate 325 (65 FE) MG tablet Take 1 tablet (325 mg total) by mouth 2 (two) times daily with a meal. Patient not taking: Reported on 08/08/2014 06/04/12   Belkys A Regalado, MD   BP 144/52 mmHg  Pulse 100  Temp(Src) 98 F (36.7 C) (Oral)  Resp 18  SpO2 98% Physical Exam  Constitutional: She is oriented to person, place, and time. She appears well-developed and well-nourished.  HENT:  Head: Normocephalic.  Mouth/Throat: No oropharyngeal exudate.  Eyes: Conjunctivae and EOM are normal. Pupils are equal, round, and reactive to light.  Neck: Normal range of motion. Neck supple.  Cardiovascular: Normal rate, regular rhythm, normal heart sounds and intact distal pulses.  Exam reveals no gallop and no friction rub.   No murmur heard. Pulmonary/Chest: Effort normal and breath sounds normal. No respiratory distress. She has no wheezes.  Abdominal: Soft. Bowel sounds are normal. There is no tenderness. There is no rebound and no guarding.  Genitourinary:  Normal-appearing external vagina.  Suspect grade 2 bladder prolapse. No evidence of bladder base outside the introitus. But does seem to come to the introitus with straining.  Normal appearing cervix. Os closed. No fluid in the posterior fornix.  The patient did have some pain with palpation of the prolapsed bladder. Otherwise no pain during bimanual.  Musculoskeletal: Normal range of motion. She exhibits no edema or tenderness.  No flank pain noted. No CVA  tenderness.  Neurological: She is alert and oriented to person, place, and time.  Skin: Skin is warm and dry.  Psychiatric: She has a normal mood and affect. Her behavior is normal.  Nursing note and vitals reviewed.   ED Course  Procedures (including critical care time) Labs Review Labs Reviewed  CBC WITH DIFFERENTIAL/PLATELET - Abnormal; Notable for the following:    RBC 3.62 (*)    Hemoglobin 9.7 (*)    HCT 30.5 (*)    Platelets 415 (*)    Eosinophils Relative 7 (*)    All other components within normal limits  COMPREHENSIVE METABOLIC PANEL - Abnormal; Notable for the following:    Glucose, Bld 140 (*)    BUN 26 (*)    Creatinine, Ser 1.86 (*)    AST 14 (*)    ALT 10 (*)    GFR calc non Af Amer 26 (*)    GFR calc Af Amer 30 (*)    All other components within normal limits  URINALYSIS, ROUTINE W REFLEX MICROSCOPIC (NOT AT [facility]) - Abnormal; Notable for the following:    APPearance TURBID (*)    Hgb urine dipstick LARGE (*)  Protein, ur >300 (*)    Leukocytes, UA LARGE (*)    All other components within normal limits  WET PREP, GENITAL  URINE CULTURE  URINE MICROSCOPIC-ADD ON  GC/CHLAMYDIA PROBE AMP ([place]) NOT AT [facility]    Imaging Review No results found.   EKG Interpretation None      MDM   Final diagnoses:  Dysuria    7:45 PM 74 y.o. female w hx of ckd, chf, dm, kidney stones s/p recent cysto w/ left sided stent placement who presents with dysuria. She notes that she has had this for the last 2 months. She notes acute worsening over the last few days. She denies any fevers, flank, or abdominal pain. She denies any vomiting. She notes that she has bladder and uterus prolapse. She states that she has some urinary incontinence at baseline. She has plans for kidney stone treatment on the eighth of next month. I'm unsure after our discussion whether her pain is urethral or vaginal or cutaneous from a lesion. Denies vaginal d/c. Will perform pelvic exam, get  screening labs, urinalysis. She states that she has been seen several times for the similar pain. She is currently on Cipro for a UTI. Recent urine cultures with no growth.  10:25 PM: VSS. Afebrile. No elev in wbc. Well appearing. Pain seems to be in the external vagina/urethral area. Labs otherwise unremarkable. Will send urine cx. Would not change abx from cipro at this point. Will provide a prescription for pain medicine as well as a bowel regimen. I have discussed the diagnosis/risks/treatment options with the patient and believe the pt to be eligible for discharge home to follow-up with her urologist. We also discussed returning to the ED immediately if new or worsening sx occur. We discussed the sx which are most concerning (e.g., worsening pain, fever, vomiting) that necessitate immediate return. Medications administered to the patient during their visit and any new prescriptions provided to the patient are listed below.  Medications given during this visit Medications  sodium chloride 0.9 % bolus 500 mL (0 mLs Intravenous Stopped 08/24/14 2031)  fentaNYL (SUBLIMAZE) injection 50 mcg (50 mcg Intravenous Given 08/24/14 2205)    New Prescriptions   DOCUSATE SODIUM (COLACE) 100 MG CAPSULE    Take 1 capsule (100 mg total) by mouth daily as needed for mild constipation.   OXYCODONE-ACETAMINOPHEN (PERCOCET) 5-325 MG PER TABLET    Take 1 tablet by mouth every 6 (six) hours as needed for moderate pain.     Pamella Pert, MD 08/24/14 2227

## 2014-08-24 NOTE — ED Notes (Signed)
Pt attempting to provide urine sample

## 2014-08-24 NOTE — ED Notes (Addendum)
Pt states that she has had vaginal pain worsening x 2 days. States she needs here bladder and uterus tacked. Also has a kidney stone that is due to be removed on 7/8; stent in place. Alert, oriented.

## 2014-08-24 NOTE — Discharge Instructions (Signed)

## 2014-08-25 ENCOUNTER — Other Ambulatory Visit (HOSPITAL_COMMUNITY): Payer: Self-pay | Admitting: Anesthesiology

## 2014-08-25 LAB — GC/CHLAMYDIA PROBE AMP (~~LOC~~) NOT AT ARMC
CHLAMYDIA, DNA PROBE: NEGATIVE
NEISSERIA GONORRHEA: NEGATIVE

## 2014-08-25 NOTE — Patient Instructions (Addendum)
Ruth Douglas  08/25/2014   Your procedure is scheduled on:  Friday 12/21/202016  Report to [facility]  Entrance take [place]  elevators to 3rd floor to  [place] at [place]  AM.  Call this number if you have problems the morning of surgery [phone]   Remember: ONLY 1 PERSON MAY GO WITH YOU TO SHORT STAY TO GET  READY MORNING OF YOUR SURGERY.   Do not eat food or drink liquids :After Midnight.     Take these medicines the morning of surgery with A SIP OF WATER: Amlodipine, Take 1/2 dose Lantus Insulin on Thursday night 09-04-14                               You may not have any metal on your body including hair pins and              piercings  Do not wear jewelry, make-up, lotions, powders or perfumes, deodorant             Do not wear nail polish.  Do not shave  48 hours prior to surgery.              Men may shave face and neck.   Do not bring valuables to the hospital. [place].  Contacts, dentures or bridgework may not be worn into surgery.  Leave suitcase in the car. After surgery it may be brought to your room.     Patients discharged the day of surgery will not be allowed to drive home.  Name and phone number of your driver: Claudean Kinds cell [phone]                Please read over the following fact sheets you were given: _____________________________________________________________________             [facility] - Preparing for Surgery Before surgery, you can play an important role.  Because skin is not sterile, your skin needs to be as free of germs as possible.  You can reduce the number of germs on your skin by washing with CHG (chlorahexidine gluconate) soap before surgery.  CHG is an antiseptic cleaner which kills germs and bonds with the skin to continue killing germs even after washing. Please DO NOT use if you have an allergy to CHG or antibacterial soaps.  If your skin  becomes reddened/irritated stop using the CHG and inform your nurse when you arrive at Short Stay. Do not shave (including legs and underarms) for at least 48 hours prior to the first CHG shower.  You may shave your face/neck. Please follow these instructions carefully:  1.  Shower with CHG Soap the night before surgery and the  morning of Surgery.  2.  If you choose to wash your hair, wash your hair first as usual with your  normal  shampoo.  3.  After you shampoo, rinse your hair and body thoroughly to remove the  shampoo.                           4.  Use CHG as you would any other liquid soap.  You can apply chg directly  to the skin and  wash                       Gently with a scrungie or clean washcloth.  5.  Apply the CHG Soap to your body ONLY FROM THE NECK DOWN.   Do not use on face/ open                           Wound or open sores. Avoid contact with eyes, ears mouth and genitals (private parts).                       Wash face,  Genitals (private parts) with your normal soap.             6.  Wash thoroughly, paying special attention to the area where your surgery  will be performed.  7.  Thoroughly rinse your body with warm water from the neck down.  8.  DO NOT shower/wash with your normal soap after using and rinsing off  the CHG Soap.                9.  Pat yourself dry with a clean towel.            10.  Wear clean pajamas.            11.  Place clean sheets on your bed the night of your first shower and do not  sleep with pets. Day of Surgery : Do not apply any lotions/deodorants the morning of surgery.  Please wear clean clothes to the hospital/surgery center.  FAILURE TO FOLLOW THESE INSTRUCTIONS MAY RESULT IN THE CANCELLATION OF YOUR SURGERY PATIENT SIGNATURE_________________________________  NURSE SIGNATURE__________________________________  ________________________________________________________________________

## 2014-08-25 NOTE — Progress Notes (Signed)
08/24/2014-noted in EPIC- abnormal lab-CBC w/diff., CMP, U/A and micro U/A 08/09/2014-noted EKG in EPIC

## 2014-08-26 ENCOUNTER — Encounter (HOSPITAL_COMMUNITY)
Admission: RE | Admit: 2014-08-26 | Discharge: 2014-08-26 | Disposition: A | Discharge status: Home / Self Care | Payer: Medicare FFS | Source: Ambulatory Visit | Attending: Urology | Admitting: Urology

## 2014-08-26 ENCOUNTER — Encounter (HOSPITAL_COMMUNITY): Payer: Self-pay

## 2014-08-26 DIAGNOSIS — N201 Calculus of ureter: Secondary | ICD-10-CM | POA: Insufficient documentation

## 2014-08-26 DIAGNOSIS — Z01812 Encounter for preprocedural laboratory examination: Secondary | ICD-10-CM | POA: Diagnosis present

## 2014-08-26 HISTORY — DX: Personal history of urinary calculi: Z87.442

## 2014-08-26 LAB — BASIC METABOLIC PANEL
Anion gap: 11 (ref 5–15)
BUN: 23 mg/dL — ABNORMAL HIGH (ref 6–20)
CHLORIDE: 100 mmol/L — AB (ref 101–111)
CO2: 25 mmol/L (ref 22–32)
CREATININE: 1.65 mg/dL — AB (ref 0.44–1.00)
Calcium: 9.3 mg/dL (ref 8.9–10.3)
GFR calc Af Amer: 35 mL/min — ABNORMAL LOW (ref >60)
GFR calc non Af Amer: 30 mL/min — ABNORMAL LOW (ref >60)
GLUCOSE: 236 mg/dL — AB (ref 65–99)
Potassium: 4.5 mmol/L (ref 3.5–5.1)
Sodium: 136 mmol/L (ref 135–145)

## 2014-08-26 LAB — CBC
HCT: 31.6 % — ABNORMAL LOW (ref 36.0–46.0)
HEMOGLOBIN: 9.9 g/dL — AB (ref 12.0–15.0)
MCH: 26.4 pg (ref 26.0–34.0)
MCHC: 31.3 g/dL (ref 30.0–36.0)
MCV: 84.3 fL (ref 78.0–100.0)
PLATELETS: 403 10*3/uL — AB (ref 150–400)
RBC: 3.75 MIL/uL — AB (ref 3.87–5.11)
RDW: 13.6 % (ref 11.5–15.5)
WBC: 6.3 10*3/uL (ref 4.0–10.5)

## 2014-08-26 LAB — URINE CULTURE
Culture: 8000
Special Requests: NORMAL

## 2014-08-26 NOTE — Progress Notes (Signed)
Cbc and bmet results routed to dr eskridge by epic

## 2014-08-29 HISTORY — PX: ABDOMINAL HYSTERECTOMY: SHX81

## 2014-09-04 NOTE — H&P (Signed)
History of Present Illness Consultation for left ureteral stone referred by Dr. Andria Frames. She was seen as a hospital consult earlier this month. She had a septic picture, UTI, 8 mm left proximal ureteral stone. Status post ureteral stent. Spiked to 102 postop but urine culture from bladder and left kidney negative. Patient had had antibiotics. She is set up for ureteroscopy.     She also complains of pelvic organ prolapse. 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 She feels a bulge per vagina which she has to reduce. She has a weak stream but also urgency and urge incontinence. She wears 2-3 depends per day which can be quite wet. Neurogenic risk includes diabetes, peripheral neuropathy and toe amputations. She was scheduled for what sounds like hysterectomy and prolapse repair in the past but it was discontinued due to some renal insufficiency. Her renal insufficiency is mild.    Today, she is been well. She said no dysuria or gross hematuria. She is not having any stent pain. No nausea or vomiting, no fevers or chills.   Past Medical History Problems  1. History of congestive heart disease (Z86.79) 2. History of diabetes mellitus (Z86.39) 3. History of Venous Thrombosis  Surgical History Problems  1. History of Cesarean Section 2. History of Cystoscopy With Insertion Of Ureteral Stent Left 3. History of Excision Of Lesion Toes  Current Meds 1. AmLODIPine Besylate 5 MG Oral Tablet;  Therapy: (Recorded:23Jun2016) to Recorded 2. Cephalexin 500 MG Oral Capsule;  Therapy: (Recorded:23Jun2016) to Recorded 3. Cranberry TABS;  Therapy: (Recorded:23Jun2016) to Recorded  Allergies Medication  1. No Known Drug Allergies  Family History Problems  1. Family history of Death In The Family Father : Father 2. Family history of Death In The Family Mother : Father 33. Family history of Family Health Status Number Of Children : Father  Social  History Problems    Denied: History of Alcohol Use   Marital History - Currently Married   Never A Smoker   Occupation: Retired  Review of Systems Genitourinary, constitutional, skin, eye, otolaryngeal, hematologic/lymphatic, cardiovascular, pulmonary, endocrine, musculoskeletal, gastrointestinal, neurological and psychiatric system(s) were reviewed and pertinent findings if present are noted and are otherwise negative.    Vitals Vital Signs [Data Includes: Last 1 Day]  Recorded: 23Jun2016 12:50PM  Weight: 145 lb  BMI Calculated: 25.69 BSA Calculated: 1.69 Recorded: 23Jun2016 12:48PM  Height: 5 ft 3 in Blood Pressure: 158 / 72 Temperature: 98.5 F Heart Rate: 87  Physical Exam Constitutional: Well nourished and well developed . No acute distress.  Pulmonary: No respiratory distress and normal respiratory rhythm and effort.  Cardiovascular: Heart rate and rhythm are normal . No peripheral edema.  Neuro/Psych:. Mood and affect are appropriate.    Results/Data Urine [Data Includes: Last 1 Day]   [id number]  COLOR YELLOW   APPEARANCE TURBID   LEUKOCYTE ESTERASE    SQUAMOUS EPITHELIAL/HPF RARE   WBC TNTC WBC/hpf  RBC 3-6 RBC/hpf  BACTERIA FEW   CRYSTALS NONE SEEN   CASTS NONE SEEN    Old records or history reviewed: epic notes.  The following images/tracing/specimen were independently visualized:  CT a/p.    Assessment Assessed  1. Calculus of ureter (N20.1) 2. 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 Urge incontinence of urine (N39.41)  Plan Calculus of ureter  1. Follow-up Keep Future Appt Office  Follow-up  Status: Hold For - Appointment  Requested  for: [phone] 2. URINE CULTURE; Status:Hold For - Specimen/Data Collection,Appointment;  Requested  WGN:[id number];   Discussion/Summary Left ureteral stone-I discussed with her again the nature risks and benefits of left ureteroscopy and holmium laser lithotripsy and stent placement. All questions. A repeat  culture as a precaution. She continues antibiotics.    Pelvic organ prolapse, urge incontinence-we will clear her stones and then I'll set her up to see my partner Dr. Matilde Sprang.     Signatures Electronically signed by : Festus Aloe, M.D.; Aug 21 2014  1:13PM EST

## 2014-09-05 ENCOUNTER — Ambulatory Visit (HOSPITAL_COMMUNITY): Payer: Medicare FFS | Admitting: Certified Registered Nurse Anesthetist

## 2014-09-05 ENCOUNTER — Encounter (HOSPITAL_COMMUNITY)
Admission: RE | Disposition: A | Discharge status: Home Health | Payer: Self-pay | Source: Ambulatory Visit | Attending: Urology

## 2014-09-05 ENCOUNTER — Encounter (HOSPITAL_COMMUNITY): Payer: Self-pay

## 2014-09-05 ENCOUNTER — Inpatient Hospital Stay (HOSPITAL_COMMUNITY)
Admission: RE | Admit: 2014-09-05 | Discharge: 2014-09-15 | DRG: 853 | Disposition: A | Discharge status: Home Health | Payer: Medicare FFS | Source: Ambulatory Visit | Attending: Urology | Admitting: Urology

## 2014-09-05 DIAGNOSIS — N3941 Urge incontinence: Secondary | ICD-10-CM | POA: Diagnosis present

## 2014-09-05 DIAGNOSIS — I129 Hypertensive chronic kidney disease with stage 1 through stage 4 chronic kidney disease, or unspecified chronic kidney disease: Secondary | ICD-10-CM | POA: Diagnosis present

## 2014-09-05 DIAGNOSIS — Z833 Family history of diabetes mellitus: Secondary | ICD-10-CM

## 2014-09-05 DIAGNOSIS — N183 Chronic kidney disease, stage 3 (moderate): Secondary | ICD-10-CM | POA: Diagnosis present

## 2014-09-05 DIAGNOSIS — I10 Essential (primary) hypertension: Secondary | ICD-10-CM | POA: Diagnosis not present

## 2014-09-05 DIAGNOSIS — R509 Fever, unspecified: Secondary | ICD-10-CM

## 2014-09-05 DIAGNOSIS — S37012A Minor contusion of left kidney, initial encounter: Secondary | ICD-10-CM | POA: Diagnosis not present

## 2014-09-05 DIAGNOSIS — E876 Hypokalemia: Secondary | ICD-10-CM | POA: Diagnosis not present

## 2014-09-05 DIAGNOSIS — Z79899 Other long term (current) drug therapy: Secondary | ICD-10-CM

## 2014-09-05 DIAGNOSIS — Z9842 Cataract extraction status, left eye: Secondary | ICD-10-CM

## 2014-09-05 DIAGNOSIS — Z9841 Cataract extraction status, right eye: Secondary | ICD-10-CM

## 2014-09-05 DIAGNOSIS — B377 Candidal sepsis: Principal | ICD-10-CM | POA: Insufficient documentation

## 2014-09-05 DIAGNOSIS — Z87442 Personal history of urinary calculi: Secondary | ICD-10-CM

## 2014-09-05 DIAGNOSIS — Z01812 Encounter for preprocedural laboratory examination: Secondary | ICD-10-CM

## 2014-09-05 DIAGNOSIS — N202 Calculus of kidney with calculus of ureter: Secondary | ICD-10-CM | POA: Diagnosis present

## 2014-09-05 DIAGNOSIS — N189 Chronic kidney disease, unspecified: Secondary | ICD-10-CM | POA: Diagnosis present

## 2014-09-05 DIAGNOSIS — D649 Anemia, unspecified: Secondary | ICD-10-CM | POA: Diagnosis present

## 2014-09-05 DIAGNOSIS — Z961 Presence of intraocular lens: Secondary | ICD-10-CM | POA: Diagnosis present

## 2014-09-05 DIAGNOSIS — E1122 Type 2 diabetes mellitus with diabetic chronic kidney disease: Secondary | ICD-10-CM | POA: Diagnosis not present

## 2014-09-05 DIAGNOSIS — N200 Calculus of kidney: Secondary | ICD-10-CM

## 2014-09-05 DIAGNOSIS — N819 Female genital prolapse, unspecified: Secondary | ICD-10-CM | POA: Diagnosis present

## 2014-09-05 DIAGNOSIS — E119 Type 2 diabetes mellitus without complications: Secondary | ICD-10-CM | POA: Diagnosis not present

## 2014-09-05 DIAGNOSIS — X58XXXA Exposure to other specified factors, initial encounter: Secondary | ICD-10-CM | POA: Diagnosis not present

## 2014-09-05 DIAGNOSIS — B3749 Other urogenital candidiasis: Secondary | ICD-10-CM | POA: Diagnosis present

## 2014-09-05 DIAGNOSIS — N390 Urinary tract infection, site not specified: Secondary | ICD-10-CM | POA: Diagnosis present

## 2014-09-05 DIAGNOSIS — E1140 Type 2 diabetes mellitus with diabetic neuropathy, unspecified: Secondary | ICD-10-CM | POA: Diagnosis present

## 2014-09-05 DIAGNOSIS — A419 Sepsis, unspecified organism: Secondary | ICD-10-CM | POA: Diagnosis present

## 2014-09-05 DIAGNOSIS — Z801 Family history of malignant neoplasm of trachea, bronchus and lung: Secondary | ICD-10-CM

## 2014-09-05 DIAGNOSIS — E871 Hypo-osmolality and hyponatremia: Secondary | ICD-10-CM | POA: Diagnosis not present

## 2014-09-05 DIAGNOSIS — Z8051 Family history of malignant neoplasm of kidney: Secondary | ICD-10-CM

## 2014-09-05 DIAGNOSIS — Z87440 Personal history of urinary (tract) infections: Secondary | ICD-10-CM

## 2014-09-05 DIAGNOSIS — I509 Heart failure, unspecified: Secondary | ICD-10-CM | POA: Diagnosis present

## 2014-09-05 DIAGNOSIS — E1169 Type 2 diabetes mellitus with other specified complication: Secondary | ICD-10-CM | POA: Diagnosis not present

## 2014-09-05 HISTORY — PX: HOLMIUM LASER APPLICATION: SHX5852

## 2014-09-05 HISTORY — PX: CYSTOSCOPY WITH RETROGRADE PYELOGRAM, URETEROSCOPY AND STENT PLACEMENT: SHX5789

## 2014-09-05 LAB — CBC WITH DIFFERENTIAL/PLATELET
Basophils Absolute: 0 10*3/uL (ref 0.0–0.1)
Basophils Relative: 0 % (ref 0–1)
EOS ABS: 0.1 10*3/uL (ref 0.0–0.7)
Eosinophils Relative: 1 % (ref 0–5)
HEMATOCRIT: 28.3 % — AB (ref 36.0–46.0)
Hemoglobin: 9.1 g/dL — ABNORMAL LOW (ref 12.0–15.0)
Lymphocytes Relative: 3 % — ABNORMAL LOW (ref 12–46)
Lymphs Abs: 0.3 10*3/uL — ABNORMAL LOW (ref 0.7–4.0)
MCH: 26.1 pg (ref 26.0–34.0)
MCHC: 32.2 g/dL (ref 30.0–36.0)
MCV: 81.1 fL (ref 78.0–100.0)
MONO ABS: 0.2 10*3/uL (ref 0.1–1.0)
MONOS PCT: 2 % — AB (ref 3–12)
Neutro Abs: 9.6 10*3/uL — ABNORMAL HIGH (ref 1.7–7.7)
Neutrophils Relative %: 94 % — ABNORMAL HIGH (ref 43–77)
Platelets: 277 10*3/uL (ref 150–400)
RBC: 3.49 MIL/uL — AB (ref 3.87–5.11)
RDW: 13.7 % (ref 11.5–15.5)
WBC: 10.2 10*3/uL (ref 4.0–10.5)

## 2014-09-05 LAB — GLUCOSE, CAPILLARY
Glucose-Capillary: 199 mg/dL — ABNORMAL HIGH (ref 65–99)
Glucose-Capillary: 176 mg/dL — ABNORMAL HIGH (ref 65–99)
Glucose-Capillary: 253 mg/dL — ABNORMAL HIGH (ref 65–99)
Glucose-Capillary: 301 mg/dL — ABNORMAL HIGH (ref 65–99)

## 2014-09-05 LAB — BASIC METABOLIC PANEL
ANION GAP: 11 (ref 5–15)
BUN: 16 mg/dL (ref 6–20)
CO2: 24 mmol/L (ref 22–32)
CREATININE: 2.13 mg/dL — AB (ref 0.44–1.00)
Calcium: 8.8 mg/dL — ABNORMAL LOW (ref 8.9–10.3)
Chloride: 102 mmol/L (ref 101–111)
GFR calc Af Amer: 26 mL/min — ABNORMAL LOW (ref >60)
GFR calc non Af Amer: 22 mL/min — ABNORMAL LOW (ref >60)
GLUCOSE: 247 mg/dL — AB (ref 65–99)
Potassium: 3.2 mmol/L — ABNORMAL LOW (ref 3.5–5.1)
SODIUM: 137 mmol/L (ref 135–145)

## 2014-09-05 SURGERY — CYSTOURETEROSCOPY, WITH RETROGRADE PYELOGRAM AND STENT INSERTION
Anesthesia: General | Laterality: Left

## 2014-09-05 MED ORDER — PROPOFOL 10 MG/ML IV BOLUS
INTRAVENOUS | Status: AC
  Filled 2014-09-05: qty 20

## 2014-09-05 MED ORDER — ONDANSETRON HCL 4 MG/2ML IJ SOLN
4.0000 mg | INTRAMUSCULAR | Status: DC | PRN
  Administered 2014-09-05 – 2014-09-09 (×4): 4 mg via INTRAVENOUS
  Filled 2014-09-05 (×3): qty 2

## 2014-09-05 MED ORDER — NITROFURANTOIN MONOHYD MACRO 100 MG PO CAPS: 100.0000 mg | ORAL_CAPSULE | Freq: Every day | ORAL | Status: DC

## 2014-09-05 MED ORDER — FENTANYL CITRATE (PF) 100 MCG/2ML IJ SOLN
25.0000 ug | INTRAMUSCULAR | Status: DC | PRN
  Administered 2014-09-05: 25 ug via INTRAVENOUS
  Administered 2014-09-05: 50 ug via INTRAVENOUS
  Filled 2014-09-05: qty 2

## 2014-09-05 MED ORDER — FENTANYL CITRATE (PF) 100 MCG/2ML IJ SOLN: 25.0000 ug | INTRAMUSCULAR | Status: DC | PRN

## 2014-09-05 MED ORDER — INSULIN ASPART 100 UNIT/ML ~~LOC~~ SOLN
0.0000 [IU] | Freq: Three times a day (TID) | SUBCUTANEOUS | Status: DC
  Administered 2014-09-06: 5 [IU] via SUBCUTANEOUS
  Administered 2014-09-06: 3 [IU] via SUBCUTANEOUS
  Administered 2014-09-07 (×2): 2 [IU] via SUBCUTANEOUS
  Administered 2014-09-07: 1 [IU] via SUBCUTANEOUS
  Administered 2014-09-08: 3 [IU] via SUBCUTANEOUS
  Administered 2014-09-09 – 2014-09-10 (×4): 2 [IU] via SUBCUTANEOUS
  Administered 2014-09-10 – 2014-09-11 (×3): 3 [IU] via SUBCUTANEOUS
  Administered 2014-09-11: 2 [IU] via SUBCUTANEOUS
  Administered 2014-09-12: 3 [IU] via SUBCUTANEOUS
  Administered 2014-09-12 – 2014-09-13 (×2): 2 [IU] via SUBCUTANEOUS
  Administered 2014-09-13 (×2): 5 [IU] via SUBCUTANEOUS
  Administered 2014-09-14: 2 [IU] via SUBCUTANEOUS
  Administered 2014-09-14: 3 [IU] via SUBCUTANEOUS
  Administered 2014-09-14: 8 [IU] via SUBCUTANEOUS
  Administered 2014-09-15 (×2): 3 [IU] via SUBCUTANEOUS

## 2014-09-05 MED ORDER — ENOXAPARIN SODIUM 30 MG/0.3ML ~~LOC~~ SOLN
30.0000 mg | SUBCUTANEOUS | Status: DC
  Administered 2014-09-06 – 2014-09-07 (×2): 30 mg via SUBCUTANEOUS
  Filled 2014-09-05 (×2): qty 0.3

## 2014-09-05 MED ORDER — ACETAMINOPHEN 325 MG PO TABS
650.0000 mg | ORAL_TABLET | ORAL | Status: DC | PRN
  Administered 2014-09-05: 650 mg via ORAL
  Filled 2014-09-05 (×2): qty 2

## 2014-09-05 MED ORDER — DOCUSATE SODIUM 100 MG PO CAPS
100.0000 mg | ORAL_CAPSULE | Freq: Two times a day (BID) | ORAL | Status: DC
  Administered 2014-09-05 – 2014-09-11 (×8): 100 mg via ORAL
  Filled 2014-09-05 (×20): qty 1

## 2014-09-05 MED ORDER — SODIUM CHLORIDE 0.9 % IR SOLN
Status: DC | PRN
  Administered 2014-09-05: 5000 mL

## 2014-09-05 MED ORDER — SODIUM CHLORIDE 0.9 % IV SOLN
INTRAVENOUS | Status: DC
  Administered 2014-09-05 – 2014-09-10 (×12): via INTRAVENOUS

## 2014-09-05 MED ORDER — VANCOMYCIN HCL IN DEXTROSE 1-5 GM/200ML-% IV SOLN
1000.0000 mg | Freq: Once | INTRAVENOUS | Status: AC
  Administered 2014-09-05: 1000 mg via INTRAVENOUS
  Filled 2014-09-05: qty 200

## 2014-09-05 MED ORDER — PROMETHAZINE HCL 25 MG/ML IJ SOLN
6.2500 mg | INTRAMUSCULAR | Status: AC | PRN
  Administered 2014-09-05 (×2): 6.25 mg via INTRAVENOUS
  Filled 2014-09-05: qty 1

## 2014-09-05 MED ORDER — ZOLPIDEM TARTRATE 5 MG PO TABS
5.0000 mg | ORAL_TABLET | Freq: Every evening | ORAL | Status: DC | PRN
  Administered 2014-09-13 – 2014-09-14 (×2): 5 mg via ORAL
  Filled 2014-09-05 (×2): qty 1

## 2014-09-05 MED ORDER — FENTANYL CITRATE (PF) 100 MCG/2ML IJ SOLN
INTRAMUSCULAR | Status: AC
  Filled 2014-09-05: qty 2

## 2014-09-05 MED ORDER — CEFAZOLIN SODIUM-DEXTROSE 2-3 GM-% IV SOLR
2.0000 g | INTRAVENOUS | Status: AC
  Administered 2014-09-05: 2 g via INTRAVENOUS

## 2014-09-05 MED ORDER — PIPERACILLIN-TAZOBACTAM 3.375 G IVPB
3.3750 g | Freq: Three times a day (TID) | INTRAVENOUS | Status: DC
  Administered 2014-09-05 – 2014-09-06 (×2): 3.375 g via INTRAVENOUS
  Filled 2014-09-05 (×2): qty 50

## 2014-09-05 MED ORDER — PIPERACILLIN-TAZOBACTAM 3.375 G IVPB 30 MIN
3.3750 g | Freq: Once | INTRAVENOUS | Status: AC
  Administered 2014-09-05: 3.375 g via INTRAVENOUS
  Filled 2014-09-05: qty 50

## 2014-09-05 MED ORDER — SODIUM CHLORIDE 0.9 % IJ SOLN
INTRAMUSCULAR | Status: AC
  Filled 2014-09-05: qty 10

## 2014-09-05 MED ORDER — ACETAMINOPHEN 650 MG RE SUPP
650.0000 mg | RECTAL | Status: DC | PRN
  Administered 2014-09-05: 650 mg via RECTAL
  Filled 2014-09-05: qty 1

## 2014-09-05 MED ORDER — FENTANYL CITRATE (PF) 100 MCG/2ML IJ SOLN
INTRAMUSCULAR | Status: DC | PRN
  Administered 2014-09-05: 50 ug via INTRAVENOUS

## 2014-09-05 MED ORDER — ONDANSETRON HCL 4 MG/2ML IJ SOLN
INTRAMUSCULAR | Status: DC | PRN
  Administered 2014-09-05: 4 mg via INTRAVENOUS

## 2014-09-05 MED ORDER — CEFAZOLIN SODIUM-DEXTROSE 2-3 GM-% IV SOLR
INTRAVENOUS | Status: AC
  Filled 2014-09-05: qty 50

## 2014-09-05 MED ORDER — EPHEDRINE SULFATE 50 MG/ML IJ SOLN
INTRAMUSCULAR | Status: DC | PRN
  Administered 2014-09-05: 10 mg via INTRAVENOUS

## 2014-09-05 MED ORDER — AMLODIPINE BESYLATE 5 MG PO TABS
5.0000 mg | ORAL_TABLET | Freq: Every morning | ORAL | Status: DC
  Filled 2014-09-05: qty 1

## 2014-09-05 MED ORDER — VANCOMYCIN HCL IN DEXTROSE 750-5 MG/150ML-% IV SOLN
750.0000 mg | INTRAVENOUS | Status: DC
  Filled 2014-09-05: qty 150

## 2014-09-05 MED ORDER — DOCUSATE SODIUM 100 MG PO CAPS
100.0000 mg | ORAL_CAPSULE | Freq: Every day | ORAL | Status: DC | PRN
  Filled 2014-09-05: qty 1

## 2014-09-05 MED ORDER — MIDAZOLAM HCL 5 MG/5ML IJ SOLN
INTRAMUSCULAR | Status: DC | PRN
  Administered 2014-09-05: 2 mg via INTRAVENOUS

## 2014-09-05 MED ORDER — PROPOFOL 10 MG/ML IV BOLUS
INTRAVENOUS | Status: DC | PRN
  Administered 2014-09-05: 160 mg via INTRAVENOUS

## 2014-09-05 MED ORDER — INSULIN ASPART 100 UNIT/ML ~~LOC~~ SOLN
0.0000 [IU] | Freq: Every day | SUBCUTANEOUS | Status: DC
  Administered 2014-09-05: 4 [IU] via SUBCUTANEOUS

## 2014-09-05 MED ORDER — OXYCODONE-ACETAMINOPHEN 5-325 MG PO TABS
1.0000 | ORAL_TABLET | ORAL | Status: DC | PRN
  Administered 2014-09-06 – 2014-09-09 (×7): 2 via ORAL
  Administered 2014-09-09: 1 via ORAL
  Administered 2014-09-10: 2 via ORAL
  Administered 2014-09-10: 1 via ORAL
  Administered 2014-09-11 – 2014-09-14 (×8): 2 via ORAL
  Filled 2014-09-05 (×3): qty 2
  Filled 2014-09-05: qty 1
  Filled 2014-09-05 (×9): qty 2
  Filled 2014-09-05: qty 1
  Filled 2014-09-05 (×4): qty 2

## 2014-09-05 MED ORDER — LIDOCAINE HCL (CARDIAC) 20 MG/ML IV SOLN
INTRAVENOUS | Status: DC | PRN
  Administered 2014-09-05: 100 mg via INTRAVENOUS

## 2014-09-05 MED ORDER — MORPHINE SULFATE 2 MG/ML IJ SOLN
2.0000 mg | INTRAMUSCULAR | Status: DC | PRN
  Administered 2014-09-05: 2 mg via INTRAVENOUS
  Administered 2014-09-13: 4 mg via INTRAVENOUS
  Filled 2014-09-05 (×2): qty 2

## 2014-09-05 MED ORDER — EPHEDRINE SULFATE 50 MG/ML IJ SOLN
INTRAMUSCULAR | Status: AC
  Filled 2014-09-05: qty 1

## 2014-09-05 MED ORDER — MIDAZOLAM HCL 2 MG/2ML IJ SOLN
INTRAMUSCULAR | Status: AC
  Filled 2014-09-05: qty 2

## 2014-09-05 MED ORDER — LACTATED RINGERS IV SOLN
INTRAVENOUS | Status: AC
  Administered 2014-09-05: 1000 mL via INTRAVENOUS
  Administered 2014-09-05: via INTRAVENOUS

## 2014-09-05 SURGICAL SUPPLY — 30 items
BAG URO CATCHER STRL LF (DRAPE) ×2 IMPLANT
BASKET LASER NITINOL 1.9FR (BASKET) IMPLANT
BASKET STNLS GEMINI 4WIRE 3FR (BASKET) IMPLANT
BASKET ZERO TIP NITINOL 2.4FR (BASKET) ×1 IMPLANT
BRUSH URET BIOPSY 3F (UROLOGICAL SUPPLIES) IMPLANT
BSKT STON RTRVL 120 1.9FR (BASKET)
BSKT STON RTRVL GEM 120X11 3FR (BASKET)
BSKT STON RTRVL ZERO TP 2.4FR (BASKET) ×1
CATH INTERMIT  6FR 70CM (CATHETERS) IMPLANT
CLOTH BEACON ORANGE TIMEOUT ST (SAFETY) ×2 IMPLANT
FIBER LASER FLEXIVA 1000 (UROLOGICAL SUPPLIES) IMPLANT
FIBER LASER FLEXIVA 200 (UROLOGICAL SUPPLIES) ×1 IMPLANT
FIBER LASER FLEXIVA 365 (UROLOGICAL SUPPLIES) IMPLANT
FIBER LASER FLEXIVA 550 (UROLOGICAL SUPPLIES) IMPLANT
FIBER LASER TRAC TIP (UROLOGICAL SUPPLIES) ×1 IMPLANT
GLOVE BIOGEL M STRL SZ7.5 (GLOVE) ×2 IMPLANT
GOWN STRL REUS W/TWL XL LVL3 (GOWN DISPOSABLE) ×2 IMPLANT
GUIDEWIRE ANG ZIPWIRE 038X150 (WIRE) ×1 IMPLANT
GUIDEWIRE STR DUAL SENSOR (WIRE) ×1 IMPLANT
IV NS IRRIG 3000ML ARTHROMATIC (IV SOLUTION) ×2 IMPLANT
KIT BALLIN UROMAX 15FX10 (LABEL) IMPLANT
KIT BALLN UROMAX 15FX4 (MISCELLANEOUS) IMPLANT
KIT BALLN UROMAX 26 75X4 (MISCELLANEOUS)
PACK CYSTO (CUSTOM PROCEDURE TRAY) ×3 IMPLANT
SET HIGH PRES BAL DIL (LABEL)
SHEATH ACCESS URETERAL 24CM (SHEATH) IMPLANT
SHEATH ACCESS URETERAL 38CM (SHEATH) ×1 IMPLANT
SHEATH ACCESS URETERAL 54CM (SHEATH) IMPLANT
STENT URET 6FRX24 CONTOUR (STENTS) ×1 IMPLANT
SYRINGE IRR TOOMEY STRL 70CC (SYRINGE) IMPLANT

## 2014-09-05 NOTE — Consult Note (Signed)
[facility] Medical Consultation  Ruth Douglas WCH:[id number] DOB: 1940/06/04 DOA: 09/05/2014 PCP: No PCP Per Patient   Requesting physician: Dr. Junious Silk Date of consultation: 09/05/14 Reason for consultation: Sepsis  Impression/Recommendations Principal Problem:   Sepsis - We'll cover patient with broad-spectrum IV antibiotics Zosyn and vancomycin - Most likely related to UTI a source given the symptoms worsened after left ureteral stone removal. - Monitor CBCs  Active Problems:   DM2 (diabetes mellitus, type 2) -Place on diabetic diet and sliding scale insulin with frequent CBG monitoring     CKD (chronic kidney disease) - Monitor serum creatinine, last serum creatinine 1.6  -  will reassess serum creatinine next a.m.    Essential hypertension - Pt on amlodipine. Pending blood pressures may have to discontinue amlodipine. Last BP 153/77    Kidney stone on left side - Patient is status post left ureteroscopy and holmium laser lithotripsy and stent placement. - Urology managing.  I will followup again tomorrow. Please contact me if I can be of assistance in the meanwhile. Thank you for this consultation.  Chief Complaint: nausea and left sided discomfort  HPI:  Patient states she has had several months of left-sided discomfort. But within the last several days she has had increase in pain and nausea. Subsequently she presented for further evaluation and recommendations. She denies any sick contacts. Today has vomited several times. Has been feeling chills and has diaphoresis. Currently no other complaints otherwise. We were consulted by urology for suspected sepsis physiology.   Review of Systems:  12 point review of system reviewed and negative unless otherwise mentioned above.  Past Medical History  Diagnosis Date  . Blood transfusion without reported diagnosis years ago  . Diabetes mellitus without complication   . Hypertension   . UTI (lower urinary tract  infection)   . CHF (congestive heart failure) years ago    no current cardiologist, pt does not remember having chf  . History of kidney stones     x 2 in past   Past Surgical History  Procedure Laterality Date  . Toe amputation    . Cesarean section    . Cystoscopy with stent placement  01/16/2012    Procedure: CYSTOSCOPY WITH STENT PLACEMENT;  Surgeon: Dutch Gray, MD;  Location: [facility];  Service: Urology;  Laterality: Left;  cysto, retrograde , ureteroscopy , stone extraction with laser lithotripsy, stent placement on left, bladder biopsies with fulgaration  . Holmium laser application  35/36/1443    Procedure: HOLMIUM LASER APPLICATION;  Surgeon: Dutch Gray, MD;  Location: [facility];  Service: Urology;  Laterality: Left;  . Amputation Left 06/01/2012    Procedure: LEFT FOOT TRANSMETATARSEL AMPUTATION;  Surgeon: Wylene Simmer, MD;  Location: [place];  Service: Orthopedics;  Laterality: Left;  . Achilles tendon surgery Left 06/01/2012    Procedure: PERCUTANEOUS TENDON ACHILLES LENGTHING;  Surgeon: Wylene Simmer, MD;  Location: [place];  Service: Orthopedics;  Laterality: Left;  . Amputation Right 06/20/2014    Procedure: AMPUTATION FOOT;  Surgeon: Newt Minion, MD;  Location: [place];  Service: Orthopedics;  Laterality: Right;  . Cystoscopy w/ ureteral stent placement Left 08/08/2014    Procedure: CYSTOSCOPY WITH RETROGRADE PYELOGRAM/URETERAL STENT PLACEMENT;  Surgeon: Festus Aloe, MD;  Location: [place];  Service: Urology;  Laterality: Left;  Marland Kitchen Eye surgery Bilateral     lens replacements for cataracts   Social History:  reports that she has never smoked. She has never used smokeless tobacco. She reports that she does  not drink alcohol or use illicit drugs.  No Known Allergies Family History  Problem Relation Age of Onset  . Diabetes Mellitus II Mother   . Diabetes Mellitus II Father   . Lung cancer Sister   . Kidney cancer Brother     Prior to Admission medications   Medication Sig Start Date  End Date Taking? Authorizing Provider  acetaminophen (TYLENOL) 500 MG tablet Take 1,000 mg by mouth every 6 (six) hours as needed for mild pain (pain).    Yes Historical Provider, MD  amLODipine (NORVASC) 5 MG tablet Take 5 mg by mouth every morning.  06/24/14  Yes Historical Provider, MD  cephALEXin (KEFLEX) 500 MG capsule Take 1 capsule (500 mg total) by mouth every 6 (six) hours. 08/11/14  Yes Ashly M Gottschalk, DO  CRANBERRY PO Take 1 capsule by mouth every morning.    Yes Historical Provider, MD  docusate sodium (COLACE) 100 MG capsule Take 1 capsule (100 mg total) by mouth daily as needed for mild constipation. 08/24/14  Yes Pamella Pert, MD  insulin glargine (LANTUS) 100 UNIT/ML injection Inject 0.15 mLs (15 Units total) into the skin at bedtime. 08/11/14  Yes Ashly Windell Moulding, DO  oxyCODONE-acetaminophen (PERCOCET) 5-325 MG per tablet Take 1 tablet by mouth every 6 (six) hours as needed for moderate pain. 08/24/14  Yes Pamella Pert, MD  ferrous sulfate 325 (65 FE) MG tablet Take 1 tablet (325 mg total) by mouth 2 (two) times daily with a meal. Patient not taking: Reported on 08/08/2014 06/04/12   Belkys A Regalado, MD  nitrofurantoin, macrocrystal-monohydrate, (MACROBID) 100 MG capsule Take 1 capsule (100 mg total) by mouth at bedtime. 09/05/14   Festus Aloe, MD   Physical Exam: Blood pressure 153/77, pulse 131, temperature 103.2 F (39.6 C), temperature source Axillary, resp. rate 16, height 5\' 5"  (1.651 m), weight 65.5 kg (144 lb 6.4 oz), SpO2 100 %. Filed Vitals:   09/05/14 1633  BP: 153/77  Pulse: 131  Temp: 103.2 F (39.6 C)  Resp: 16   Gen: Pt in Alert and awake, laying supine in bed, diaphoretic  Eyes: no icterus, normal ext appearance  ENT: normal exterior appearance, no masses on visual examination  Neck: supple, no goiter  Cardiovascular: rrr, no mrg  Respiratory: cta bl, no wheezes  Abdomen: soft, NT, nd  Skin: no rashes on limited  exam  Musculoskeletal: no cyanosis or clubbing  Psychiatric: mood and affect appropriate  Neurologic: no facial asymmetry, answers questions appropriately  Labs on Admission:  Basic Metabolic Panel: No results for input(s): NA, K, CL, CO2, GLUCOSE, BUN, CREATININE, CALCIUM, MG, PHOS in the last 168 hours. Liver Function Tests: No results for input(s): AST, ALT, ALKPHOS, BILITOT, PROT, ALBUMIN in the last 168 hours. No results for input(s): LIPASE, AMYLASE in the last 168 hours. No results for input(s): AMMONIA in the last 168 hours. CBC: No results for input(s): WBC, NEUTROABS, HGB, HCT, MCV, PLT in the last 168 hours. Cardiac Enzymes: No results for input(s): CKTOTAL, CKMB, CKMBINDEX, TROPONINI in the last 168 hours. BNP: Invalid input(s): POCBNP CBG:  Recent Labs Lab 09/05/14 0858 09/05/14 1325  GLUCAP 199* 176*    Radiological Exams on Admission: No results found.   Time spent: > 40 minutes  Velvet Bathe [facility] Pager [phone]  If 7PM-7AM, please contact night-coverage [email or web address] Password [facility] 09/05/2014, 5:37 PM

## 2014-09-05 NOTE — Progress Notes (Signed)
Report given to charge nurse  Janett Billow RN on 4th floor.

## 2014-09-05 NOTE — Progress Notes (Signed)
Informed Dr. Junious Silk of pt's temp of 102.0, HR 140 and BP 148/80 and pt continues to vomit intermittenly.   New orders given.

## 2014-09-05 NOTE — Progress Notes (Addendum)
ANTIBIOTIC CONSULT NOTE - INITIAL  Pharmacy Consult for Zosyn, Vancomycin Indication: sepsis  No Known Allergies  Patient Measurements: Height: 5\' 5"  (165.1 cm) Weight: 144 lb 6.4 oz (65.5 kg) IBW/kg (Calculated) : 57  Vital Signs: Temp: 103.2 F (39.6 C) (07/08 1633) Temp Source: Axillary (07/08 1633) BP: 153/77 mmHg (07/08 1633) Pulse Rate: 131 (07/08 1633) Intake/Output from previous day:   Intake/Output from this shift: Total I/O In: 1500 [I.V.:1500] Out: -   Labs: No results for input(s): WBC, HGB, PLT, LABCREA, CREATININE in the last 72 hours. Estimated Creatinine Clearance: 28.1 mL/min (by C-G formula based on Cr of 1.65). No results for input(s): VANCOTROUGH, VANCOPEAK, VANCORANDOM, GENTTROUGH, GENTPEAK, GENTRANDOM, TOBRATROUGH, TOBRAPEAK, TOBRARND, AMIKACINPEAK, AMIKACINTROU, AMIKACIN in the last 72 hours.   Microbiology: Recent Results (from the past 720 hour(s))  Urine culture     Status: None   Collection Time: 08/08/14 12:42 PM  Result Value Ref Range Status   Specimen Description URINE, RANDOM  Final   Special Requests NONE  Final   Colony Count NO GROWTH Performed at [facility]   Final   Culture NO GROWTH Performed at [facility]   Final   Report Status 08/09/2014 FINAL  Final  Urine culture     Status: None   Collection Time: 08/08/14  9:09 PM  Result Value Ref Range Status   Specimen Description URINE, RANDOM  Final   Special Requests BLADDER  Final   Colony Count NO GROWTH Performed at [facility]   Final   Culture NO GROWTH Performed at [facility]   Final   Report Status 08/10/2014 FINAL  Final  Urine culture     Status: None   Collection Time: 08/24/14  8:20 PM  Result Value Ref Range Status   Specimen Description URINE, CLEAN CATCH  Final   Special Requests Normal  Final   Culture   Final    8,000 COLONIES/mL INSIGNIFICANT GROWTH Performed at [facility]    Report Status 08/26/2014  FINAL  Final  Wet prep, genital     Status: None   Collection Time: 08/24/14  8:50 PM  Result Value Ref Range Status   Yeast Wet Prep HPF POC NONE SEEN NONE SEEN Final   Trich, Wet Prep NONE SEEN NONE SEEN Final   Clue Cells Wet Prep HPF POC NONE SEEN NONE SEEN Final   WBC, Wet Prep HPF POC NONE SEEN NONE SEEN Final    Medical History: Past Medical History  Diagnosis Date  . Blood transfusion without reported diagnosis years ago  . Diabetes mellitus without complication   . Hypertension   . UTI (lower urinary tract infection)   . CHF (congestive heart failure) years ago    no current cardiologist, pt does not remember having chf  . History of kidney stones     x 2 in past    Medications:  Anti-infectives    Start     Dose/Rate Route Frequency Ordered Stop   09/06/14 1600  vancomycin (VANCOCIN) IVPB 750 mg/150 ml premix     750 mg 150 mL/hr over 60 Minutes Intravenous Every 24 hours 09/05/14 1709     09/06/14 0000  piperacillin-tazobactam (ZOSYN) IVPB 3.375 g     3.375 g 12.5 mL/hr over 240 Minutes Intravenous Every 8 hours 09/05/14 1709     09/05/14 1730  vancomycin (VANCOCIN) IVPB 1000 mg/200 mL premix     1,000 mg 200 mL/hr over 60 Minutes Intravenous  Once  09/05/14 1709     09/05/14 1730  piperacillin-tazobactam (ZOSYN) IVPB 3.375 g     3.375 g 100 mL/hr over 30 Minutes Intravenous  Once 09/05/14 1709     09/05/14 0903  ceFAZolin (ANCEF) IVPB 2 g/50 mL premix     2 g 100 mL/hr over 30 Minutes Intravenous 30 min pre-op 09/05/14 6712 09/05/14 1120     Assessment: 74 y.o. female with L ureteral stone and stent placed 6/10 admitted 09/05/2014 for removal of L ureteral stone now s/p lithotripsy.  Experiencing  fevers to 103 and continued emesis post-op.  Note that patient had similar symptoms after stent placement, but cultures were negative. Treated with Keflex at that time.  Pharmacy to begin vancomycin and Zosyn for sepsis, presumably of renal origin.  Did receive cefazolin  at 1100 this AM.  7/8 >> Zosyn >> 7/8 >> Vancomycin >>    Temp: 103.2 WBC: pending Renal: SCr 1.65; CrCl 28 CG   Goal of Therapy:  Vancomycin trough level 15-20 mcg/ml  Eradication of infection Appropriate antibiotic dosing for indication and renal function  Plan:  Day 1 antibiotics Vancomycin 1000 mg IV now, then 750 mg IV q34 hr Measure vancomycin trough levels at steady state as indicated Zosyn 3.375 g IV given once over 30 minutes, then every 8 hrs by 4-hr infusion  Follow clinical course, renal function, culture results as available  Follow for de-escalation of antibiotics and LOT   Reuel Boom, PharmD, BCPS Pager: [phone] 09/05/2014, 5:16 PM

## 2014-09-05 NOTE — Anesthesia Preprocedure Evaluation (Addendum)
Anesthesia Evaluation  Patient identified by MRN, date of birth, ID band Patient awake    Reviewed: Allergy & Precautions, NPO status , Patient's Chart, lab work & pertinent test results  Airway Mallampati: II  TM Distance: >3 FB Neck ROM: Full    Dental no notable dental hx.    Pulmonary shortness of breath,  breath sounds clear to auscultation  Pulmonary exam normal       Cardiovascular hypertension, Pt. on medications +CHF Normal cardiovascular examRhythm:Regular Rate:Normal     Neuro/Psych negative neurological ROS  negative psych ROS   GI/Hepatic negative GI ROS, Neg liver ROS,   Endo/Other  diabetes, Type 2, Insulin Dependent  Renal/GU Renal InsufficiencyRenal disease  negative genitourinary   Musculoskeletal  (+) Arthritis -,   Abdominal   Peds negative pediatric ROS (+)  Hematology  (+) anemia ,   Anesthesia Other Findings   Reproductive/Obstetrics negative OB ROS                             Anesthesia Physical Anesthesia Plan  ASA: III  Anesthesia Plan: General   Post-op Pain Management:    Induction: Intravenous  Airway Management Planned: LMA  Additional Equipment:   Intra-op Plan:   Post-operative Plan: Extubation in OR  Informed Consent: I have reviewed the patients History and Physical, chart, labs and discussed the procedure including the risks, benefits and alternatives for the proposed anesthesia with the patient or authorized representative who has indicated his/her understanding and acceptance.   Dental advisory given  Plan Discussed with: CRNA  Anesthesia Plan Comments:         Anesthesia Quick Evaluation

## 2014-09-05 NOTE — Anesthesia Postprocedure Evaluation (Signed)
  Anesthesia Post-op Note  Patient: Ruth Douglas  Procedure(s) Performed: Procedure(s) (LRB): CYSTO, LEFT RETROGRADE PYELOGRAM, LEFT URETEROSCOPY AND STENT PLACEMENT (Left) LEFT HOLMIUM LASER APPLICATION (Left)  Patient Location: PACU  Anesthesia Type: General  Level of Consciousness: awake and alert   Airway and Oxygen Therapy: Patient Spontanous Breathing  Post-op Pain: mild  Post-op Assessment: Post-op Vital signs reviewed, Patient's Cardiovascular Status Stable, Respiratory Function Stable, Patent Airway and No signs of Nausea or vomiting  Last Vitals:  Filed Vitals:   09/05/14 1350  BP: 150/59  Pulse: 100  Temp: 36.6 C  Resp:     Post-op Vital Signs: stable   Complications: No apparent anesthesia complications

## 2014-09-05 NOTE — Progress Notes (Signed)
ANTICOAGULATION CONSULT NOTE - Initial Consult  Pharmacy Consult for Lovenox Indication: VTE prophylaxis  No Known Allergies  Patient Measurements: Height: 5\' 5"  (165.1 cm) Weight: 144 lb 6.4 oz (65.5 kg) IBW/kg (Calculated) : 57  Vital Signs: Temp: 103.2 F (39.6 C) (07/08 1633) Temp Source: Axillary (07/08 1633) BP: 153/Ruth mmHg (07/08 1633) Pulse Rate: 131 (07/08 1633)  Labs: No results for input(s): HGB, HCT, PLT, APTT, LABPROT, INR, HEPARINUNFRC, CREATININE, CKTOTAL, CKMB, TROPONINI in the last 72 hours.  Estimated Creatinine Clearance: 28.1 mL/min (by C-G formula based on Cr of 1.65).   Medical History: Past Medical History  Diagnosis Date  . Blood transfusion without reported diagnosis years ago  . Diabetes mellitus without complication   . Hypertension   . UTI (lower urinary tract infection)   . CHF (congestive heart failure) years ago    no current cardiologist, pt does not remember having chf  . History of kidney stones     x 2 in past    Medications:  Prescriptions prior to admission  Medication Sig Dispense Refill Last Dose  . acetaminophen (TYLENOL) 500 MG tablet Take 1,000 mg by mouth every 6 (six) hours as needed for mild pain (pain).    Past Month at Unknown time  . amLODipine (NORVASC) 5 MG tablet Take 5 mg by mouth every morning.   0 09/05/2014 at 0700  . cephALEXin (KEFLEX) 500 MG capsule Take 1 capsule (500 mg total) by mouth every 6 (six) hours. 28 capsule 0 09/04/2014 at Unknown time  . CRANBERRY PO Take 1 capsule by mouth every morning.    09/04/2014 at Unknown time  . docusate sodium (COLACE) 100 MG capsule Take 1 capsule (100 mg total) by mouth daily as needed for mild constipation. 60 capsule 0 havent gotten  . insulin glargine (LANTUS) 100 UNIT/ML injection Inject 0.15 mLs (15 Units total) into the skin at bedtime. 10 mL 0 09/04/2014 at 2100  . oxyCODONE-acetaminophen (PERCOCET) 5-325 MG per tablet Take 1 tablet by mouth every 6 (six) hours as needed  for moderate pain. 15 tablet 0 Past Week at Unknown time  . ferrous sulfate 325 (65 FE) MG tablet Take 1 tablet (325 mg total) by mouth 2 (two) times daily with a meal. (Patient not taking: Reported on 08/08/2014) 60 tablet 0 Not Taking at Unknown time   PRN: acetaminophen, acetaminophen, docusate sodium, fentaNYL (SUBLIMAZE) injection, morphine injection, ondansetron, oxyCODONE-acetaminophen, zolpidem  Assessment: 74 y.o. Douglas with L ureteral stone and stent placed 6/10 admitted 09/05/2014 for removal of L ureteral stone now s/p lithotripsy. CKD; pharmacy to dose Lovenox for VTE prophylaxis   Prior anticoagulation: none  CrCl 28 ml/min  Significant events:  Today, 09/05/2014:  CBC: (6/28 pre-op labs): Hgb low; Plt wnl  SCr elevated  Goal of Therapy: Prevent VTE Monitor platelets by anticoagulation protocol: Yes  Plan:  Lovenox 30 mg SQ q24 hr to start tomorrow  F/u SCr closely  Check CBC at least every q72 hr while on Lovenox   Reuel Boom, PharmD Pager: [phone] 09/05/2014, 5:33 PM

## 2014-09-05 NOTE — Op Note (Signed)
Preoperative diagnosis: Left ureteral stone Postoperative diagnosis: Left ureteral stone  Procedure: Cystoscopy, left ureteroscopy, holmium laser lithotripsy, stone basket extraction and left ureteral stent placement with tether  Surgeon: Brion Hedges  Type of anesthesia: Gen.  Indication for procedure: Patient is a 74 year old female underwent left ureteral stent urgently for possible urinary tract infection and sepsis due to a left proximal 8-10 mm stone. She presents today for definitive stone management.  Findings:  On exam under anesthesia she has significant prolapse with the cervix and bladder descending down to the introitus.  On cystoscopy the bladder was unremarkable. There were no stones in the bladder. The left ureteral stent was visible.  On ureteroscopy the ureter distal to the stone appeared normal but around the stone there was significant inflammation and cystitis cystica changes surrounding the stone in the ureter and collecting system proximal to the stone had this same inflammatory appearance.  Description of procedure: After consent was obtained patient brought to the operating room. After adequate anesthesia she is placed in lithotomy position and prepped and draped in the usual sterile fashion. The cystoscope was passed per urethra and there was quite a bit of pyuria in the bladder. I copiously irrigated the bladder to clear it out. The left ureteral stent was grasped and removed through the urethral meatus. A sensor wire was advanced and coiled in the collecting system. I then used in a ureteral access sheath to place 2 wires and replaced the access sheath adjacent to the sensor wire. The ureteroscope was then advanced and the stone was located. It was fragmented at a setting of 0.5 and 40 which rendered it into very small pieces. Some of these pieces were collected for analysis with the Nitinol basket. The remainder of the ureter and collecting system was inspected and noted to  have significant inflammation but no further significant stone fragments. There were no fragments visible on fluoroscopy. The stone was visible on scout imaging. The ureteroscope and the access sheath were withdrawn together and the ureter was noted to be stone free and without injury.  The wire was backloaded on the cystoscope and a 6 x 26 cm stent was advanced. The wire was removed with a good coil reconstituting in the collecting system and a good coil in the bladder. The string was fixed to the patient. She was awakened and taken to the recovery room in stable condition.  Complications none  Blood loss minimal  Drains: 6 x 26 cm left ureteral stent with tether  Specimens: Urine for culture to hospital lab, Stone fragments to office lab  Disposition: Patient stable to PACU

## 2014-09-05 NOTE — Progress Notes (Signed)
Patient ID: [name], female   DOB: 01/01/1941, 74 y.o.   MRN: [id number]   Patient admitted with sepsis following left URS. I put her on Zosyn and sent blood cultures. Urine cx sent from OR.   No CP or SOB. She is complaining of some left lower qaudrant pain which is probably from the access sheath/procedure.   She just received po acetaminophen PR.   Filed Vitals:   09/05/14 1633  BP: 153/77  Pulse: 131  Temp: 103.2 F (39.6 C)  Resp: 16    HR down to 123 on tele  PE: NAD CV - RRR Lungs CTAB Abd - soft, NT   Imp/plan - sepsis following left ureteroscopy, holmium laser lithtripsy, left ureteral stent -  -blood and urine cx's pending -Zosyn -consulted hospitalist re: sepsis, DM, CKI. Discussed with Dr. Wendee Beavers and appreciate his input/management of her medical issues.  -will follow

## 2014-09-05 NOTE — Interval H&P Note (Signed)
History and Physical Interval Note:  09/05/2014 11:12 AM  Federico Flake Romain  has presented today for surgery, with the diagnosis of LEFT URETERAL STONE  The various methods of treatment have been discussed with the patient and family. After consideration of risks, benefits and other options for treatment, the patient has consented to  Procedure(s): CYSTO, LEFT RETROGRADE PYELOGRAM, LEFT URETEROSCOPY AND STENT PLACEMENT (Left) LEFT HOLMIUM LASER APPLICATION (Left) as a surgical intervention .  The patient's history has been reviewed, patient examined, no change in status, stable for surgery.  I have reviewed the patient's chart and labs.  Questions were answered to the patient's satisfaction.  She is well. I reviewed ED notes and labs. She started abx I sent. Main complaint is pelvic pressure and discomfort. No dysuria or fever. Discussed possible staged procedure, need for stent with string.    Ladiamond Gallina

## 2014-09-05 NOTE — Transfer of Care (Signed)
Immediate Anesthesia Transfer of Care Note  Patient: Ruth Douglas  Procedure(s) Performed: Procedure(s): CYSTO, LEFT RETROGRADE PYELOGRAM, LEFT URETEROSCOPY AND STENT PLACEMENT (Left) LEFT HOLMIUM LASER APPLICATION (Left)  Patient Location: PACU  Anesthesia Type:General  Level of Consciousness: awake, alert  and oriented  Airway & Oxygen Therapy: Patient Spontanous Breathing and Patient connected to face mask oxygen  Post-op Assessment: Report given to RN and Post -op Vital signs reviewed and stable  Post vital signs: Reviewed and stable  Last Vitals:  Filed Vitals:   09/05/14 0902  BP: 101/73  Pulse: 101  Temp: 36.7 C  Resp: 16    Complications: No apparent anesthesia complications

## 2014-09-06 DIAGNOSIS — E1169 Type 2 diabetes mellitus with other specified complication: Secondary | ICD-10-CM | POA: Diagnosis not present

## 2014-09-06 DIAGNOSIS — B379 Candidiasis, unspecified: Secondary | ICD-10-CM | POA: Diagnosis not present

## 2014-09-06 DIAGNOSIS — E871 Hypo-osmolality and hyponatremia: Secondary | ICD-10-CM | POA: Diagnosis not present

## 2014-09-06 DIAGNOSIS — S37012A Minor contusion of left kidney, initial encounter: Secondary | ICD-10-CM | POA: Diagnosis not present

## 2014-09-06 DIAGNOSIS — Z9841 Cataract extraction status, right eye: Secondary | ICD-10-CM | POA: Diagnosis not present

## 2014-09-06 DIAGNOSIS — X58XXXA Exposure to other specified factors, initial encounter: Secondary | ICD-10-CM | POA: Diagnosis not present

## 2014-09-06 DIAGNOSIS — Z8051 Family history of malignant neoplasm of kidney: Secondary | ICD-10-CM | POA: Diagnosis not present

## 2014-09-06 DIAGNOSIS — E1140 Type 2 diabetes mellitus with diabetic neuropathy, unspecified: Secondary | ICD-10-CM | POA: Diagnosis present

## 2014-09-06 DIAGNOSIS — N200 Calculus of kidney: Secondary | ICD-10-CM | POA: Diagnosis not present

## 2014-09-06 DIAGNOSIS — Z801 Family history of malignant neoplasm of trachea, bronchus and lung: Secondary | ICD-10-CM | POA: Diagnosis not present

## 2014-09-06 DIAGNOSIS — R509 Fever, unspecified: Secondary | ICD-10-CM | POA: Diagnosis not present

## 2014-09-06 DIAGNOSIS — Z79899 Other long term (current) drug therapy: Secondary | ICD-10-CM | POA: Diagnosis not present

## 2014-09-06 DIAGNOSIS — Z87442 Personal history of urinary calculi: Secondary | ICD-10-CM | POA: Diagnosis not present

## 2014-09-06 DIAGNOSIS — E119 Type 2 diabetes mellitus without complications: Secondary | ICD-10-CM | POA: Diagnosis not present

## 2014-09-06 DIAGNOSIS — E876 Hypokalemia: Secondary | ICD-10-CM | POA: Diagnosis not present

## 2014-09-06 DIAGNOSIS — I129 Hypertensive chronic kidney disease with stage 1 through stage 4 chronic kidney disease, or unspecified chronic kidney disease: Secondary | ICD-10-CM | POA: Diagnosis present

## 2014-09-06 DIAGNOSIS — Z01812 Encounter for preprocedural laboratory examination: Secondary | ICD-10-CM | POA: Diagnosis not present

## 2014-09-06 DIAGNOSIS — Z87440 Personal history of urinary (tract) infections: Secondary | ICD-10-CM | POA: Diagnosis not present

## 2014-09-06 DIAGNOSIS — B377 Candidal sepsis: Secondary | ICD-10-CM | POA: Diagnosis present

## 2014-09-06 DIAGNOSIS — N189 Chronic kidney disease, unspecified: Secondary | ICD-10-CM | POA: Diagnosis not present

## 2014-09-06 DIAGNOSIS — E1122 Type 2 diabetes mellitus with diabetic chronic kidney disease: Secondary | ICD-10-CM | POA: Diagnosis not present

## 2014-09-06 DIAGNOSIS — N3941 Urge incontinence: Secondary | ICD-10-CM | POA: Diagnosis present

## 2014-09-06 DIAGNOSIS — I509 Heart failure, unspecified: Secondary | ICD-10-CM | POA: Diagnosis present

## 2014-09-06 DIAGNOSIS — R110 Nausea: Secondary | ICD-10-CM | POA: Diagnosis not present

## 2014-09-06 DIAGNOSIS — Z960 Presence of urogenital implants: Secondary | ICD-10-CM | POA: Diagnosis not present

## 2014-09-06 DIAGNOSIS — Z961 Presence of intraocular lens: Secondary | ICD-10-CM | POA: Diagnosis present

## 2014-09-06 DIAGNOSIS — N390 Urinary tract infection, site not specified: Secondary | ICD-10-CM | POA: Diagnosis present

## 2014-09-06 DIAGNOSIS — B3749 Other urogenital candidiasis: Secondary | ICD-10-CM | POA: Diagnosis present

## 2014-09-06 DIAGNOSIS — Z9842 Cataract extraction status, left eye: Secondary | ICD-10-CM | POA: Diagnosis not present

## 2014-09-06 DIAGNOSIS — I10 Essential (primary) hypertension: Secondary | ICD-10-CM | POA: Diagnosis not present

## 2014-09-06 DIAGNOSIS — D649 Anemia, unspecified: Secondary | ICD-10-CM | POA: Diagnosis present

## 2014-09-06 DIAGNOSIS — Z833 Family history of diabetes mellitus: Secondary | ICD-10-CM | POA: Diagnosis not present

## 2014-09-06 DIAGNOSIS — N202 Calculus of kidney with calculus of ureter: Secondary | ICD-10-CM | POA: Diagnosis present

## 2014-09-06 DIAGNOSIS — N819 Female genital prolapse, unspecified: Secondary | ICD-10-CM | POA: Diagnosis present

## 2014-09-06 DIAGNOSIS — N183 Chronic kidney disease, stage 3 (moderate): Secondary | ICD-10-CM | POA: Diagnosis present

## 2014-09-06 LAB — BASIC METABOLIC PANEL
Anion gap: 9 (ref 5–15)
BUN: 19 mg/dL (ref 6–20)
CO2: 24 mmol/L (ref 22–32)
CREATININE: 2.69 mg/dL — AB (ref 0.44–1.00)
Calcium: 7.9 mg/dL — ABNORMAL LOW (ref 8.9–10.3)
Chloride: 102 mmol/L (ref 101–111)
GFR calc non Af Amer: 17 mL/min — ABNORMAL LOW (ref >60)
GFR, EST AFRICAN AMERICAN: 19 mL/min — AB (ref >60)
Glucose, Bld: 234 mg/dL — ABNORMAL HIGH (ref 65–99)
Potassium: 4.5 mmol/L (ref 3.5–5.1)
Sodium: 135 mmol/L (ref 135–145)

## 2014-09-06 LAB — CBC
HEMATOCRIT: 28.9 % — AB (ref 36.0–46.0)
Hemoglobin: 9.3 g/dL — ABNORMAL LOW (ref 12.0–15.0)
MCH: 26.6 pg (ref 26.0–34.0)
MCHC: 32.2 g/dL (ref 30.0–36.0)
MCV: 82.6 fL (ref 78.0–100.0)
Platelets: 259 10*3/uL (ref 150–400)
RBC: 3.5 MIL/uL — ABNORMAL LOW (ref 3.87–5.11)
RDW: 13.8 % (ref 11.5–15.5)
WBC: 19 10*3/uL — AB (ref 4.0–10.5)

## 2014-09-06 LAB — GLUCOSE, CAPILLARY
GLUCOSE-CAPILLARY: 207 mg/dL — AB (ref 65–99)
GLUCOSE-CAPILLARY: 114 mg/dL — AB (ref 65–99)
Glucose-Capillary: 189 mg/dL — ABNORMAL HIGH (ref 65–99)
Glucose-Capillary: 116 mg/dL — ABNORMAL HIGH (ref 65–99)

## 2014-09-06 MED ORDER — LACTATED RINGERS IV SOLN: INTRAVENOUS | Status: DC

## 2014-09-06 MED ORDER — ACETAMINOPHEN 325 MG PO TABS: 325.0000 mg | ORAL_TABLET | Freq: Four times a day (QID) | ORAL | Status: DC | PRN

## 2014-09-06 MED ORDER — ACETAMINOPHEN 325 MG RE SUPP: 325.0000 mg | Freq: Once | RECTAL | Status: DC

## 2014-09-06 MED ORDER — SODIUM CHLORIDE 0.9 % IV BOLUS (SEPSIS)
500.0000 mL | Freq: Once | INTRAVENOUS | Status: AC
  Administered 2014-09-06: 500 mL via INTRAVENOUS

## 2014-09-06 MED ORDER — PIPERACILLIN-TAZOBACTAM IN DEX 2-0.25 GM/50ML IV SOLN
2.2500 g | Freq: Three times a day (TID) | INTRAVENOUS | Status: DC
  Administered 2014-09-06 – 2014-09-07 (×4): 2.25 g via INTRAVENOUS
  Filled 2014-09-06 (×5): qty 50

## 2014-09-06 MED ORDER — ACETAMINOPHEN 325 MG PO TABS
325.0000 mg | ORAL_TABLET | Freq: Once | ORAL | Status: AC
  Administered 2014-09-06: 325 mg via ORAL
  Filled 2014-09-06: qty 1

## 2014-09-06 MED ORDER — FLUCONAZOLE 100MG IVPB
100.0000 mg | INTRAVENOUS | Status: DC
  Administered 2014-09-06: 100 mg via INTRAVENOUS
  Filled 2014-09-06 (×2): qty 50

## 2014-09-06 MED ORDER — SODIUM CHLORIDE 0.9 % IV BOLUS (SEPSIS)
1000.0000 mL | INTRAVENOUS | Status: AC
  Administered 2014-09-06: 1000 mL via INTRAVENOUS

## 2014-09-06 MED ORDER — VANCOMYCIN HCL IN DEXTROSE 1-5 GM/200ML-% IV SOLN
1000.0000 mg | INTRAVENOUS | Status: DC
  Filled 2014-09-06: qty 200

## 2014-09-06 NOTE — Progress Notes (Signed)
[facility] PROGRESS NOTE  Ruth Douglas MEQ:[id number] DOB: 12-23-1940 DOA: 09/05/2014 PCP: No PCP Per Patient  Assessment/Plan: Principal Problem:  Sepsis - We'll cover patient with broad-spectrum IV antibiotics Zosyn and vancomycin. Considering broadening with antifungal based on discussion with Urologist. Place pharmacy consult for fluconazole - Most likely from urinary source - CBC went up from last check, will reassess next am.  - awaiting urine and blood cultures.  Active Problems:  DM2 (diabetes mellitus, type 2) - Continue diabetic diet and sliding scale insulin with frequent CBG monitoring    CKD (chronic kidney disease) - Monitor serum creatinine, last serum creatinine 2.6 - will reassess serum creatinine next a.m.   Essential hypertension - Patient has had some soft blood pressures intermittently as such will discontinue amlodipine   Kidney stone on left side - Patient is status post left ureteroscopy and holmium laser lithotripsy and stent placement. - Urology managing.   Code Status: full Family Communication: no family at bedside.    Procedures:  None  Antibiotics:  Vancomycin and Zosyn  HPI/Subjective: Pt has no new complaints. Feels slightly better than yesterday  Objective: Filed Vitals:   09/06/14 1303  BP: 107/41  Pulse: 104  Temp: 102.5 F (39.2 C)  Resp: 20    Intake/Output Summary (Last 24 hours) at 09/06/14 1522 Last data filed at 09/06/14 1321  Gross per 24 hour  Intake 4089.58 ml  Output    250 ml  Net 3839.58 ml   Filed Weights   09/05/14 0911 09/05/14 1633  Weight: 64.949 kg (143 lb 3 oz) 65.5 kg (144 lb 6.4 oz)    Exam:   General:  Patient in no acute distress, alert and awake  Cardiovascular: S1 and S2 present, no rubs  Respiratory: Clear to auscultation bilaterally, no wheezes  Abdomen: Soft, no guarding, nondistended  Musculoskeletal: Equal tone bilaterally   Data Reviewed: Basic Metabolic  Panel:  Recent Labs Lab 09/05/14 1740 09/06/14 0550  NA 137 135  K 3.2* 4.5  CL 102 102  CO2 24 24  GLUCOSE 247* 234*  BUN 16 19  CREATININE 2.13* 2.69*  CALCIUM 8.8* 7.9*   Liver Function Tests: No results for input(s): AST, ALT, ALKPHOS, BILITOT, PROT, ALBUMIN in the last 168 hours. No results for input(s): LIPASE, AMYLASE in the last 168 hours. No results for input(s): AMMONIA in the last 168 hours. CBC:  Recent Labs Lab 09/05/14 1740 09/06/14 0550  WBC 10.2 19.0*  NEUTROABS 9.6*  --   HGB 9.1* 9.3*  HCT 28.3* 28.9*  MCV 81.1 82.6  PLT 277 259   Cardiac Enzymes: No results for input(s): CKTOTAL, CKMB, CKMBINDEX, TROPONINI in the last 168 hours. BNP (last 3 results) No results for input(s): BNP in the last 8760 hours.  ProBNP (last 3 results) No results for input(s): PROBNP in the last 8760 hours.  CBG:  Recent Labs Lab 09/05/14 1325 09/05/14 1752 09/05/14 2039 09/06/14 0742 09/06/14 1120  GLUCAP 176* 253* 301* 207* 189*    Recent Results (from the past 240 hour(s))  Urine culture     Status: None (Preliminary result)   Collection Time: 09/05/14 11:33 AM  Result Value Ref Range Status   Specimen Description URINE, CATHETERIZED CYSTOCOPE  Final   Special Requests NONE  Final   Culture   Final    CULTURE REINCUBATED FOR BETTER GROWTH Performed at [facility]    Report Status PENDING  Incomplete  Culture, blood (routine x 2)     Status:  None (Preliminary result)   Collection Time: 09/05/14  5:30 PM  Result Value Ref Range Status   Specimen Description BLOOD LEFT HAND  Final   Special Requests BOTTLES DRAWN AEROBIC ONLY 4CC  Final   Culture   Final    NO GROWTH < 24 HOURS Performed at [facility]    Report Status PENDING  Incomplete  Culture, blood (routine x 2)     Status: None (Preliminary result)   Collection Time: 09/05/14  5:40 PM  Result Value Ref Range Status   Specimen Description BLOOD LEFT ARM  Final   Special  Requests BOTTLES DRAWN AEROBIC AND ANAEROBIC 10CC  Final   Culture   Final    NO GROWTH < 24 HOURS Performed at [facility]    Report Status PENDING  Incomplete     Studies: No results found.  Scheduled Meds: . amLODipine  5 mg Oral q morning - 10a  . docusate sodium  100 mg Oral BID  . enoxaparin (LOVENOX) injection  30 mg Subcutaneous Q24H  . fluconazole (DIFLUCAN) IV  100 mg Intravenous Q24H  . insulin aspart  0-15 Units Subcutaneous TID WC  . insulin aspart  0-5 Units Subcutaneous QHS  . piperacillin-tazobactam (ZOSYN)  IV  2.25 g Intravenous 3 times per day  . [START ON 09/07/2014] vancomycin  1,000 mg Intravenous Q48H   Continuous Infusions: . sodium chloride 125 mL/hr at 09/06/14 8768    Principal Problem:   Sepsis Active Problems:   DM2 (diabetes mellitus, type 2)   CKD (chronic kidney disease)   Essential hypertension   Kidney stone on left side    Time spent: > 35 minutes    Velvet Bathe  [facility] Pager [phone]. If 7PM-7AM, please contact night-coverage at [email or web address], password [facility] 09/06/2014, 3:22 PM  LOS: 1 day

## 2014-09-06 NOTE — Progress Notes (Signed)
Catheter bag changed to 320ml urine meter foley collection system to better assess I&O.

## 2014-09-06 NOTE — Progress Notes (Signed)
ANTIBIOTIC CONSULT NOTE - FOLLOW UP  Pharmacy Consult for Vancomycin / Zosyn Indication: Sepsis  No Known Allergies  Patient Measurements: Height: 5\' 5"  (165.1 cm) Weight: 144 lb 6.4 oz (65.5 kg) IBW/kg (Calculated) : 57 Adjusted Body Weight:   Vital Signs: Temp: 98.9 F (37.2 C) (07/09 1102) Temp Source: Oral (07/09 1102) BP: 125/65 mmHg (07/09 1102) Pulse Rate: 127 (07/09 1102) Intake/Output from previous day: 07/08 0701 - 07/09 0700 In: 5089.6 [I.V.:5039.6; IV Piggyback:50] Out: -  Intake/Output from this shift:    Labs:  Recent Labs  09/05/14 1740 09/06/14 0550  WBC 10.2 19.0*  HGB 9.1* 9.3*  PLT 277 259  CREATININE 2.13* 2.69*   Estimated Creatinine Clearance: 17.3 mL/min (by C-G formula based on Cr of 2.69). No results for input(s): VANCOTROUGH, VANCOPEAK, VANCORANDOM, GENTTROUGH, GENTPEAK, GENTRANDOM, TOBRATROUGH, TOBRAPEAK, TOBRARND, AMIKACINPEAK, AMIKACINTROU, AMIKACIN in the last 72 hours.   Microbiology: Recent Results (from the past 720 hour(s))  Urine culture     Status: None   Collection Time: 08/08/14 12:42 PM  Result Value Ref Range Status   Specimen Description URINE, RANDOM  Final   Special Requests NONE  Final   Colony Count NO GROWTH Performed at [facility]   Final   Culture NO GROWTH Performed at [facility]   Final   Report Status 08/09/2014 FINAL  Final  Urine culture     Status: None   Collection Time: 08/08/14  9:09 PM  Result Value Ref Range Status   Specimen Description URINE, RANDOM  Final   Special Requests BLADDER  Final   Colony Count NO GROWTH Performed at [facility]   Final   Culture NO GROWTH Performed at [facility]   Final   Report Status 08/10/2014 FINAL  Final  Urine culture     Status: None   Collection Time: 08/24/14  8:20 PM  Result Value Ref Range Status   Specimen Description URINE, CLEAN CATCH  Final   Special Requests Normal  Final   Culture   Final    8,000  COLONIES/mL INSIGNIFICANT GROWTH Performed at [facility]    Report Status 08/26/2014 FINAL  Final  Wet prep, genital     Status: None   Collection Time: 08/24/14  8:50 PM  Result Value Ref Range Status   Yeast Wet Prep HPF POC NONE SEEN NONE SEEN Final   Trich, Wet Prep NONE SEEN NONE SEEN Final   Clue Cells Wet Prep HPF POC NONE SEEN NONE SEEN Final   WBC, Wet Prep HPF POC NONE SEEN NONE SEEN Final  Urine culture     Status: None (Preliminary result)   Collection Time: 09/05/14 11:33 AM  Result Value Ref Range Status   Specimen Description URINE, CATHETERIZED CYSTOCOPE  Final   Special Requests NONE  Final   Culture   Final    CULTURE REINCUBATED FOR BETTER GROWTH Performed at [facility]    Report Status PENDING  Incomplete  Culture, blood (routine x 2)     Status: None (Preliminary result)   Collection Time: 09/05/14  5:30 PM  Result Value Ref Range Status   Specimen Description BLOOD LEFT HAND  Final   Special Requests BOTTLES DRAWN AEROBIC ONLY 4CC  Final   Culture   Final    NO GROWTH < 24 HOURS Performed at [facility]    Report Status PENDING  Incomplete  Culture, blood (routine x 2)     Status: None (Preliminary  result)   Collection Time: 09/05/14  5:40 PM  Result Value Ref Range Status   Specimen Description BLOOD LEFT ARM  Final   Special Requests BOTTLES DRAWN AEROBIC AND ANAEROBIC 10CC  Final   Culture   Final    NO GROWTH < 24 HOURS Performed at [facility]    Report Status PENDING  Incomplete    Anti-infectives    Start     Dose/Rate Route Frequency Ordered Stop   09/06/14 1600  vancomycin (VANCOCIN) IVPB 750 mg/150 ml premix  Status:  Discontinued     750 mg 150 mL/hr over 60 Minutes Intravenous Every 24 hours 09/05/14 1709 09/06/14 1256   09/06/14 1400  piperacillin-tazobactam (ZOSYN) IVPB 2.25 g     2.25 g 100 mL/hr over 30 Minutes Intravenous 3 times per day 09/06/14 0908     09/06/14 0000  piperacillin-tazobactam  (ZOSYN) IVPB 3.375 g  Status:  Discontinued     3.375 g 12.5 mL/hr over 240 Minutes Intravenous Every 8 hours 09/05/14 1709 09/06/14 0907   09/05/14 1730  vancomycin (VANCOCIN) IVPB 1000 mg/200 mL premix     1,000 mg 200 mL/hr over 60 Minutes Intravenous  Once 09/05/14 1709 09/05/14 1839   09/05/14 1730  piperacillin-tazobactam (ZOSYN) IVPB 3.375 g     3.375 g 100 mL/hr over 30 Minutes Intravenous  Once 09/05/14 1709 09/05/14 1809   09/05/14 0903  ceFAZolin (ANCEF) IVPB 2 g/50 mL premix     2 g 100 mL/hr over 30 Minutes Intravenous 30 min pre-op 09/05/14 0903 09/05/14 1120      Assessment: 73 yoF with SIRS following left ureteroscopic stone extraction 7/8  7/8 >> Ancef >> 7/8 7/8 >> Zosyn >> 7/8 >> Vancomycin >>    Temp: 103.2 WBC: Increased to 19.0K Renal: SCr bumped up 2.69, CrCl 17 CG  7/8 bloodx2: NGTD 7/8 urine: reincubated   Dose changes/levels: 7/8 Adjusted Vanc 750mg  q24h-->1g q48h, ZEI -->2.25g q8h for reduced renal fxn  Goal of Therapy:  Vancomycin trough level 15-20 mcg/ml  Eradication of infection  Plan:  Adjust antibiotics for decline in renal fxn Change to Vancomycin 1g IV q48h Change to Zosyn 2.25g q8h F/u CrCl, cultures, clinical course  Ralene Bathe, PharmD, BCPS 09/06/2014, 1:02 PM  Pager: [phone]

## 2014-09-06 NOTE — Addendum Note (Signed)
Addendum  created 09/06/14 1117 by Donnella Bi, RN   Modules edited: Anesthesia Device Management, Anesthesia Events, Narrator, Narrator Events   Narrator:  Narrator: Event Log Edited   Narrator Events:  Delete Start Data Collection event

## 2014-09-06 NOTE — Progress Notes (Signed)
Doctor was paged due to increased fever that remained elevated after administration of PRN tylenol.

## 2014-09-06 NOTE — Progress Notes (Signed)
ANTIBIOTIC CONSULT NOTE - INITIAL  Pharmacy Consult for Diflucan  A/P: Patient known to pharmacy for dosing of vancomycin/Zosyn earlier today. See previous note for labs and full assessment. Diflucan already entered by urology today. Dosing is correct for patient's renal function. Continue 100mg  IV daily. Will follow along  Adrian Saran, PharmD, BCPS Pager [phone] 09/06/2014 3:43 PM

## 2014-09-06 NOTE — Progress Notes (Signed)
UROLOGY PROGRESS NOTES  Assessment/Plan: Ruth Douglas is a 74 y.o. female admitted for meeting SIRS criteria following LEFT ureteroscopic stone extraction 09/05/14. Persistently febrile to 103.6 overnight, on broad spectrum antibiotics with cultures pending and the hospitalist service following given sepsis and multiple medical issues. Will place foley for maximal urinary tract decompression and continue supportive care and broad spectrum antibiotics.  Neuro:  -Alert and Oriented -Pain controlled  CV:  -Tachycardic, improving  -Downtrending BP but stable and not yet hypotensive  Pulmonary:  - NWOB on RA  FEN/GI:  - Diet: as tolerated - Replace electrolytes as needed  GU: - Monitor urine output - Place foley for maximal decompression and accurate Is and Os  Heme/ID - Leukocytosis to 19k, blood and urine cultures pending -On vancomycin and zosyn, may need to consider possibility of yeast if persistently febrile  -Hopsitalist service following for management of sepsis, greatly appreciate input  Prophylaxis: - IS, OOB, ambulate with assitance only  Disposition: - floor, if continues to clinically deteriorate will need escalation of care  Subjective: Febrile to 103.6 o/n with tachycardia consistent with SIRS and presumed sepsis of urinary origin. No N/V. Pain controlled. Alert and oriented.  Objective:  Vital signs in last 24 hours: BP 101/42 mmHg  Pulse 80  Temp(Src) 98.2 F (36.8 C) (Oral)  Resp 20  Ht 5\' 5"  (1.651 m)  Wt 65.5 kg (144 lb 6.4 oz)  BMI 24.03 kg/m2  SpO2 100%   Intake/Output last 24 hours: I/O last 3 completed shifts: In: 5089.6 [I.V.:5039.6; IV Piggyback:50] Out: -     Physical Exam: General: No acute distress Pulmonary: Normal work of breathing on 2L [place] Cardiovascular: Pulse tachycardic rate and rhythm normal Abdomen: Soft, nontender, nondistended GU: no foley Extremities: Warm and well perfused Neuro: Intact, no obvious deficits  Data  Review: WBC      Lab Results  Component Value Date   WBC 19.0* 09/06/2014   HGB 9.3* 09/06/2014   HCT 28.9* 09/06/2014   PLT 259 09/06/2014     Lab Results  Component Value Date   NA 135 09/06/2014   K 4.5 09/06/2014   CL 102 09/06/2014   CO2 24 09/06/2014   BUN 19 09/06/2014   CREATININE 2.69* 09/06/2014   CALCIUM 7.9* 09/06/2014   Patient was seen, examined,treatment plan was discussed with the resident.  I have directly reviewed the clinical findings, lab, imaging studies and management of this patient in detail. I have made the necessary changes and/or additions to the above noted documentation, and agree with the documentation, as recorded by the resident.

## 2014-09-07 LAB — BASIC METABOLIC PANEL
ANION GAP: 6 (ref 5–15)
BUN: 24 mg/dL — ABNORMAL HIGH (ref 6–20)
CALCIUM: 6.6 mg/dL — AB (ref 8.9–10.3)
CHLORIDE: 105 mmol/L (ref 101–111)
CO2: 22 mmol/L (ref 22–32)
Creatinine, Ser: 2.02 mg/dL — ABNORMAL HIGH (ref 0.44–1.00)
GFR calc non Af Amer: 24 mL/min — ABNORMAL LOW (ref >60)
GFR, EST AFRICAN AMERICAN: 27 mL/min — AB (ref >60)
Glucose, Bld: 123 mg/dL — ABNORMAL HIGH (ref 65–99)
Potassium: 4 mmol/L (ref 3.5–5.1)
Sodium: 133 mmol/L — ABNORMAL LOW (ref 135–145)

## 2014-09-07 LAB — CBC
HCT: 23.5 % — ABNORMAL LOW (ref 36.0–46.0)
Hemoglobin: 7.4 g/dL — ABNORMAL LOW (ref 12.0–15.0)
MCH: 26.1 pg (ref 26.0–34.0)
MCHC: 31.5 g/dL (ref 30.0–36.0)
MCV: 83 fL (ref 78.0–100.0)
PLATELETS: 205 10*3/uL (ref 150–400)
RBC: 2.83 MIL/uL — AB (ref 3.87–5.11)
RDW: 14.1 % (ref 11.5–15.5)
WBC: 13.7 10*3/uL — ABNORMAL HIGH (ref 4.0–10.5)

## 2014-09-07 LAB — GLUCOSE, CAPILLARY
Glucose-Capillary: 128 mg/dL — ABNORMAL HIGH (ref 65–99)
Glucose-Capillary: 151 mg/dL — ABNORMAL HIGH (ref 65–99)
Glucose-Capillary: 121 mg/dL — ABNORMAL HIGH (ref 65–99)
Glucose-Capillary: 143 mg/dL — ABNORMAL HIGH (ref 65–99)

## 2014-09-07 LAB — URINE CULTURE: Culture: >=100000

## 2014-09-07 LAB — HEMOGLOBIN AND HEMATOCRIT, BLOOD
HCT: 24.4 % — ABNORMAL LOW (ref 36.0–46.0)
HEMOGLOBIN: 7.9 g/dL — AB (ref 12.0–15.0)

## 2014-09-07 MED ORDER — FLUCONAZOLE IN SODIUM CHLORIDE 400-0.9 MG/200ML-% IV SOLN
400.0000 mg | INTRAVENOUS | Status: DC
Start: 2014-09-07 — End: 2014-09-07

## 2014-09-07 MED ORDER — SODIUM CHLORIDE 0.9 % IV SOLN
200.0000 mg | Freq: Once | INTRAVENOUS | Status: AC
  Administered 2014-09-07: 200 mg via INTRAVENOUS
  Filled 2014-09-07: qty 200

## 2014-09-07 MED ORDER — SODIUM CHLORIDE 0.9 % IV SOLN
100.0000 mg | INTRAVENOUS | Status: DC
  Administered 2014-09-08 – 2014-09-15 (×8): 100 mg via INTRAVENOUS
  Filled 2014-09-07 (×8): qty 100

## 2014-09-07 MED ORDER — SODIUM CHLORIDE 0.9 % IV BOLUS (SEPSIS)
500.0000 mL | Freq: Once | INTRAVENOUS | Status: AC
  Administered 2014-09-07: 500 mL via INTRAVENOUS

## 2014-09-07 NOTE — Progress Notes (Signed)
UROLOGY PROGRESS NOTES  Assessment/Plan: Ruth Douglas is a 74 y.o. female admitted for meeting SIRS criteria following LEFT ureteroscopic stone extraction 09/05/14. Fever curve downtrending with Tmax 102.5 at 1 pm. On broad spectrum antibiotics (including antifungals) and Hemodynamic parameters responsive to fluid boluses (1.5 L total) with cultures pending. Hospitalist service following given sepsis and multiple medical issues. Hemodynamic parameters responsive to fluids.  Neuro:  -Alert and Oriented -Pain controlled  CV:  -Tachycardic, improving  -Responsive to fluids  Pulmonary:  - NWOB on RA  FEN/GI:  - Diet: as tolerated - Replace electrolytes as needed - Creatinine downtrending  GU: - Monitor urine output - Place foley for maximal decompression and accurate Is and Os -Will plan to discontinue once fever curve resolves -Stent in place with string secured to thigh  Heme/ID - Leukocytosis improved to 13k from 19k, blood and urine cultures pending -On vancomycin and zosyn, and fluconazole  -Hopsitalist service following for management of sepsis, greatly appreciate input -Hb 7.4 from 9 the day prior, will recheck PM H/H to confirm my suspicion that this is just dilutional  Prophylaxis: - IS, OOB, ambulate with assitance only  Disposition: - floor, if continues to clinically deteriorate will need escalation of care  Subjective: Febrile to 102.5 yesterday AM. Antimicrobials broadened to include fluconazole. Hemodynamic parameters relatively stable and tachycardia somewhat responsive to fluid boluses 1.5L. Mental status improved markedly.  Objective:  Vital signs in last 24 hours: BP 114/54 mmHg  Pulse 106  Temp(Src) 101 F (38.3 C) (Oral)  Resp 18  Ht 5\' 5"  (1.651 m)  Wt 65.5 kg (144 lb 6.4 oz)  BMI 24.03 kg/m2  SpO2 96%   Intake/Output last 24 hours: I/O last 3 completed shifts: In: 8125 [I.V.:6375; IV Piggyback:1750] Out: 989 [Urine:876]    Physical  Exam: General: No acute distress Pulmonary: Normal work of breathing on 2L [place] Cardiovascular: Pulse tachycardic rate and rhythm normal Abdomen: Soft, nontender, nondistended GU: no foley Extremities: Warm and well perfused Neuro: Intact, no obvious deficits  Data Review: WBC      Lab Results  Component Value Date   WBC 13.7* 09/07/2014   HGB 7.4* 09/07/2014   HCT 23.5* 09/07/2014   PLT 205 09/07/2014     Lab Results  Component Value Date   NA 133* 09/07/2014   K 4.0 09/07/2014   CL 105 09/07/2014   CO2 22 09/07/2014   BUN 24* 09/07/2014   CREATININE 2.02* 09/07/2014   CALCIUM 6.6* 09/07/2014

## 2014-09-07 NOTE — Progress Notes (Addendum)
[facility] PROGRESS NOTE  Ruth Douglas KDT:[id number] DOB: 11/28/1940 DOA: 09/05/2014 PCP: No PCP Per Patient  Assessment/Plan: Principal Problem:  Sepsis - Currently patient on broad-spectrum IV antibiotics Zosyn and vancomycin.  - Antifungal on board addendum: patient blood culture positive for yeast. Consulted ID who will assist with anti infective recommendations. - Most likely from urinary source - WBC trending down currently on current regimen. Patient showing clinical improvement in condition. - urine and blood cultures pending.  Active Problems:  DM2 (diabetes mellitus, type 2) - Continue diabetic diet and sliding scale insulin with frequent CBG monitoring    CKD (chronic kidney disease) - Monitor serum creatinine, last serum creatinine trending down.   Essential hypertension - Will continue to hold amlodipine   Kidney stone on left side - Patient is status post left ureteroscopy and holmium laser lithotripsy and stent placement. - Urology managing.   Code Status: full Family Communication: no family at bedside.    Procedures:  None  Antibiotics:  Vancomycin and Zosyn  HPI/Subjective: Pt reports feeling better today.  Objective: Filed Vitals:   09/07/14 0538  BP: 114/54  Pulse: 106  Temp: 101 F (38.3 C)  Resp: 18    Intake/Output Summary (Last 24 hours) at 09/07/14 0955 Last data filed at 09/07/14 0700  Gross per 24 hour  Intake   4700 ml  Output    876 ml  Net   3824 ml   Filed Weights   09/05/14 0911 09/05/14 1633  Weight: 64.949 kg (143 lb 3 oz) 65.5 kg (144 lb 6.4 oz)    Exam:   General:  Patient in no acute distress, alert and awake  Cardiovascular: no cyanosis   Respiratory: no increased wob, equal chest rise,  no wheezes  Abdomen: Soft, no guarding, nondistended  Musculoskeletal: Equal tone bilaterally   Data Reviewed: Basic Metabolic Panel:  Recent Labs Lab 09/05/14 1740 09/06/14 0550 09/07/14 0520   NA 137 135 133*  K 3.2* 4.5 4.0  CL 102 102 105  CO2 24 24 22   GLUCOSE 247* 234* 123*  BUN 16 19 24*  CREATININE 2.13* 2.69* 2.02*  CALCIUM 8.8* 7.9* 6.6*   Liver Function Tests: No results for input(s): AST, ALT, ALKPHOS, BILITOT, PROT, ALBUMIN in the last 168 hours. No results for input(s): LIPASE, AMYLASE in the last 168 hours. No results for input(s): AMMONIA in the last 168 hours. CBC:  Recent Labs Lab 09/05/14 1740 09/06/14 0550 09/07/14 0520  WBC 10.2 19.0* 13.7*  NEUTROABS 9.6*  --   --   HGB 9.1* 9.3* 7.4*  HCT 28.3* 28.9* 23.5*  MCV 81.1 82.6 83.0  PLT 277 259 205   Cardiac Enzymes: No results for input(s): CKTOTAL, CKMB, CKMBINDEX, TROPONINI in the last 168 hours. BNP (last 3 results) No results for input(s): BNP in the last 8760 hours.  ProBNP (last 3 results) No results for input(s): PROBNP in the last 8760 hours.  CBG:  Recent Labs Lab 09/06/14 0742 09/06/14 1120 09/06/14 1632 09/06/14 2209 09/07/14 0801  GLUCAP 207* 189* 114* 116* 128*    Recent Results (from the past 240 hour(s))  Urine culture     Status: None (Preliminary result)   Collection Time: 09/05/14 11:33 AM  Result Value Ref Range Status   Specimen Description URINE, CATHETERIZED CYSTOCOPE  Final   Special Requests NONE  Final   Culture   Final    CULTURE REINCUBATED FOR BETTER GROWTH Performed at [facility]    Report Status PENDING  Incomplete  Culture, blood (routine x 2)     Status: None (Preliminary result)   Collection Time: 09/05/14  5:30 PM  Result Value Ref Range Status   Specimen Description BLOOD LEFT HAND  Final   Special Requests BOTTLES DRAWN AEROBIC ONLY 4CC  Final   Culture  Setup Time   Final    YEAST AEROBIC BOTTLE ONLY CRITICAL RESULT CALLED TO, READ BACK BY AND VERIFIED WITH: Tillman Sers RN [phone] 2325 GREEN R    Culture   Final    NO GROWTH < 24 HOURS Performed at [facility]    Report Status PENDING  Incomplete  Culture, blood  (routine x 2)     Status: None (Preliminary result)   Collection Time: 09/05/14  5:40 PM  Result Value Ref Range Status   Specimen Description BLOOD LEFT ARM  Final   Special Requests BOTTLES DRAWN AEROBIC AND ANAEROBIC 10CC  Final   Culture  Setup Time   Final    YEAST AEROBIC BOTTLE ONLY CRITICAL RESULT CALLED TO, READ BACK BY AND VERIFIED WITH: Loyal Buba AT 2325 09/06/14 BY RGREEN    Culture YEAST Performed at [facility]   Final   Report Status PENDING  Incomplete     Studies: No results found.  Scheduled Meds: . docusate sodium  100 mg Oral BID  . enoxaparin (LOVENOX) injection  30 mg Subcutaneous Q24H  . fluconazole (DIFLUCAN) IV  100 mg Intravenous Q24H  . insulin aspart  0-15 Units Subcutaneous TID WC  . insulin aspart  0-5 Units Subcutaneous QHS  . piperacillin-tazobactam (ZOSYN)  IV  2.25 g Intravenous 3 times per day  . vancomycin  1,000 mg Intravenous Q48H   Continuous Infusions: . sodium chloride 125 mL/hr at 09/07/14 0310    Principal Problem:   Sepsis Active Problems:   DM2 (diabetes mellitus, type 2)   CKD (chronic kidney disease)   Essential hypertension   Kidney stone on left side    Time spent: > 35 minutes    Velvet Bathe  [facility] Pager [phone]. If 7PM-7AM, please contact night-coverage at [email or web address], password [facility] 09/07/2014, 9:55 AM  LOS: 2 days

## 2014-09-07 NOTE — Consult Note (Signed)
[name]  Total days of antibiotics 3        Day 3 vanco        Day 3 piptazo        Day 2 fluc       Reason for Consult: candidemia    Referring Physician: vega  Principal Problem:   Sepsis Active Problems:   DM2 (diabetes mellitus, type 2)   CKD (chronic kidney disease)   Essential hypertension   Kidney stone on left side    HPI: Ruth Douglas is a 74 y.o. female with hx of DM c/b neuropathy, b/l TM amputation of feet, HTN, hx of nephrolithiasis. She was hospitalized for in June for LLQ pain that was thought to be due to obstructive uretheral stone noted on CT with associated UTI. She underwent ureteral stent placement at that time and discharged on keflex for complicated uti which she finished on 6/20. She was admitted on 7/8 for definitive treatment with L ureteroscopy , stone extraction, laser lithotripsy, and replacement of L ureteral stent placement. Her procedure was complicated by SIRS with temp of 102F post procedure. She was admitted to the hospital and started on vancomycin and piptazo. She has been febrile for 48hr but now improving. Wbc was 19K now down to 13.7K. Her infectious work up with urine cx on 7/8 showing yeast and blood cx x 2 showing yeast as well. She was started on fluconazole 153m on 7/9. ID consulted for candidemia   She states that she has been on various course of antibiotics for the last 3 months due to recurrent uti and kidney stones  Past Medical History  Diagnosis Date  . Blood transfusion without reported diagnosis years ago  . Diabetes mellitus without complication   . Hypertension   . UTI (lower urinary tract infection)   . CHF (congestive heart failure) years ago    no current cardiologist, pt does not remember having chf  . History of kidney stones     x 2 in past    Allergies: No Known Allergies  MEDICATIONS: . [START ON 09/08/2014] anidulafungin  100 mg Intravenous Q24H  . anidulafungin  200 mg  Intravenous Once  . docusate sodium  100 mg Oral BID  . enoxaparin (LOVENOX) injection  30 mg Subcutaneous Q24H  . insulin aspart  0-15 Units Subcutaneous TID WC  . insulin aspart  0-5 Units Subcutaneous QHS  . piperacillin-tazobactam (ZOSYN)  IV  2.25 g Intravenous 3 times per day    History  Substance Use Topics  . Smoking status: Never Smoker   . Smokeless tobacco: Never Used  . Alcohol Use: No    Family History  Problem Relation Age of Onset  . Diabetes Mellitus II Mother   . Diabetes Mellitus II Father   . Lung cancer Sister   . Kidney cancer Brother     Review of Systems - comprehensive review of 11 ROS, positive pertinents listed in hpi   OBJECTIVE: Temp:  [98 F (36.7 C)-102.5 F (39.2 C)] 101 F (38.3 C) (07/10 0538) Pulse Rate:  [102-112] 106 (07/10 0538) Resp:  [18-24] 18 (07/10 0538) BP: (107-114)/(41-89) 114/54 mmHg (07/10 0538) SpO2:  [92 %-96 %] 96 % (07/10 0538)  Constitutional:  oriented to person, place, and time. appears well-developed and well-nourished. No distress.  HENT: Comfort/AT, PERRLA, no scleral icterus Mouth/Throat: Oropharynx is clear and moist. No oropharyngeal exudate.  Cardiovascular: Normal rate, regular rhythm and normal heart sounds. Exam reveals  no gallop and no friction rub.  No murmur heard.  Pulmonary/Chest: Effort normal and breath sounds normal. No respiratory distress.  has no wheezes.  Neck = supple,  Abdominal: Soft. Bowel sounds are normal.  exhibits no distension. No tenderness to lower quadrants Lymphadenopathy: no cervical adenopathy. No axillary adenopathy Neurological: alert and oriented to person, place, and time.  Skin: Skin is warm and dry. No rash noted. No erythema.  Psychiatric: a normal mood and affect.  behavior is normal.   LABS: Results for orders placed or performed during the hospital encounter of 09/05/14 (from the past 48 hour(s))  Glucose, capillary     Status: Abnormal   Collection Time: 09/05/14  1:25  PM  Result Value Ref Range   Glucose-Capillary 176 (H) 65 - 99 mg/dL   Comment 1 Document in Chart   Culture, blood (routine x 2)     Status: None (Preliminary result)   Collection Time: 09/05/14  5:30 PM  Result Value Ref Range   Specimen Description BLOOD LEFT HAND    Special Requests BOTTLES DRAWN AEROBIC ONLY 4CC    Culture  Setup Time      YEAST AEROBIC BOTTLE ONLY CRITICAL RESULT CALLED TO, READ BACK BY AND VERIFIED WITH: Tillman Sers RN [phone] 2325 GREEN R    Culture      TOO YOUNG TO READ Performed at [facility]    Report Status PENDING   Culture, blood (routine x 2)     Status: None (Preliminary result)   Collection Time: 09/05/14  5:40 PM  Result Value Ref Range   Specimen Description BLOOD LEFT ARM    Special Requests BOTTLES DRAWN AEROBIC AND ANAEROBIC 10CC    Culture  Setup Time      YEAST AEROBIC BOTTLE ONLY CRITICAL RESULT CALLED TO, READ BACK BY AND VERIFIED WITH: Loyal Buba AT 2325 09/06/14 BY RGREEN    Culture YEAST Performed at [facility]     Report Status PENDING   CBC with Differential/Platelet     Status: Abnormal   Collection Time: 09/05/14  5:40 PM  Result Value Ref Range   WBC 10.2 4.0 - 10.5 K/uL   RBC 3.49 (L) 3.87 - 5.11 MIL/uL   Hemoglobin 9.1 (L) 12.0 - 15.0 g/dL   HCT 28.3 (L) 36.0 - 46.0 %   MCV 81.1 78.0 - 100.0 fL   MCH 26.1 26.0 - 34.0 pg   MCHC 32.2 30.0 - 36.0 g/dL   RDW 13.7 11.5 - 15.5 %   Platelets 277 150 - 400 K/uL   Neutrophils Relative % 94 (H) 43 - 77 %   Neutro Abs 9.6 (H) 1.7 - 7.7 K/uL   Lymphocytes Relative 3 (L) 12 - 46 %   Lymphs Abs 0.3 (L) 0.7 - 4.0 K/uL   Monocytes Relative 2 (L) 3 - 12 %   Monocytes Absolute 0.2 0.1 - 1.0 K/uL   Eosinophils Relative 1 0 - 5 %   Eosinophils Absolute 0.1 0.0 - 0.7 K/uL   Basophils Relative 0 0 - 1 %   Basophils Absolute 0.0 0.0 - 0.1 K/uL  Basic metabolic panel     Status: Abnormal   Collection Time: 09/05/14  5:40 PM  Result Value Ref Range   Sodium 137 135 -  145 mmol/L   Potassium 3.2 (L) 3.5 - 5.1 mmol/L   Chloride 102 101 - 111 mmol/L   CO2 24 22 - 32 mmol/L   Glucose, Bld 247 (H) 65 -  99 mg/dL   BUN 16 6 - 20 mg/dL   Creatinine, Ser 2.13 (H) 0.44 - 1.00 mg/dL   Calcium 8.8 (L) 8.9 - 10.3 mg/dL   GFR calc non Af Amer 22 (L) >60 mL/min   GFR calc Af Amer 26 (L) >60 mL/min    Comment: (NOTE) The eGFR has been calculated using the CKD EPI equation. This calculation has not been validated in all clinical situations. eGFR's persistently <60 mL/min signify possible Chronic Kidney Disease.    Anion gap 11 5 - 15  Glucose, capillary     Status: Abnormal   Collection Time: 09/05/14  5:52 PM  Result Value Ref Range   Glucose-Capillary 253 (H) 65 - 99 mg/dL  Glucose, capillary     Status: Abnormal   Collection Time: 09/05/14  8:39 PM  Result Value Ref Range   Glucose-Capillary 301 (H) 65 - 99 mg/dL  Basic metabolic panel     Status: Abnormal   Collection Time: 09/06/14  5:50 AM  Result Value Ref Range   Sodium 135 135 - 145 mmol/L   Potassium 4.5 3.5 - 5.1 mmol/L    Comment: RESULT REPEATED AND VERIFIED DELTA CHECK NOTED    Chloride 102 101 - 111 mmol/L   CO2 24 22 - 32 mmol/L   Glucose, Bld 234 (H) 65 - 99 mg/dL   BUN 19 6 - 20 mg/dL   Creatinine, Ser 2.69 (H) 0.44 - 1.00 mg/dL   Calcium 7.9 (L) 8.9 - 10.3 mg/dL   GFR calc non Af Amer 17 (L) >60 mL/min   GFR calc Af Amer 19 (L) >60 mL/min    Comment: (NOTE) The eGFR has been calculated using the CKD EPI equation. This calculation has not been validated in all clinical situations. eGFR's persistently <60 mL/min signify possible Chronic Kidney Disease.    Anion gap 9 5 - 15  CBC     Status: Abnormal   Collection Time: 09/06/14  5:50 AM  Result Value Ref Range   WBC 19.0 (H) 4.0 - 10.5 K/uL   RBC 3.50 (L) 3.87 - 5.11 MIL/uL   Hemoglobin 9.3 (L) 12.0 - 15.0 g/dL   HCT 28.9 (L) 36.0 - 46.0 %   MCV 82.6 78.0 - 100.0 fL   MCH 26.6 26.0 - 34.0 pg   MCHC 32.2 30.0 - 36.0 g/dL    RDW 13.8 11.5 - 15.5 %   Platelets 259 150 - 400 K/uL  Glucose, capillary     Status: Abnormal   Collection Time: 09/06/14  7:42 AM  Result Value Ref Range   Glucose-Capillary 207 (H) 65 - 99 mg/dL  Glucose, capillary     Status: Abnormal   Collection Time: 09/06/14 11:20 AM  Result Value Ref Range   Glucose-Capillary 189 (H) 65 - 99 mg/dL  Glucose, capillary     Status: Abnormal   Collection Time: 09/06/14  4:32 PM  Result Value Ref Range   Glucose-Capillary 114 (H) 65 - 99 mg/dL  Glucose, capillary     Status: Abnormal   Collection Time: 09/06/14 10:09 PM  Result Value Ref Range   Glucose-Capillary 116 (H) 65 - 99 mg/dL  CBC     Status: Abnormal   Collection Time: 09/07/14  5:20 AM  Result Value Ref Range   WBC 13.7 (H) 4.0 - 10.5 K/uL   RBC 2.83 (L) 3.87 - 5.11 MIL/uL   Hemoglobin 7.4 (L) 12.0 - 15.0 g/dL    Comment: REPEATED TO VERIFY DELTA CHECK  NOTED    HCT 23.5 (L) 36.0 - 46.0 %   MCV 83.0 78.0 - 100.0 fL   MCH 26.1 26.0 - 34.0 pg   MCHC 31.5 30.0 - 36.0 g/dL   RDW 14.1 11.5 - 15.5 %   Platelets 205 150 - 400 K/uL  Basic metabolic panel     Status: Abnormal   Collection Time: 09/07/14  5:20 AM  Result Value Ref Range   Sodium 133 (L) 135 - 145 mmol/L   Potassium 4.0 3.5 - 5.1 mmol/L   Chloride 105 101 - 111 mmol/L   CO2 22 22 - 32 mmol/L   Glucose, Bld 123 (H) 65 - 99 mg/dL   BUN 24 (H) 6 - 20 mg/dL   Creatinine, Ser 2.02 (H) 0.44 - 1.00 mg/dL   Calcium 6.6 (L) 8.9 - 10.3 mg/dL   GFR calc non Af Amer 24 (L) >60 mL/min   GFR calc Af Amer 27 (L) >60 mL/min    Comment: (NOTE) The eGFR has been calculated using the CKD EPI equation. This calculation has not been validated in all clinical situations. eGFR's persistently <60 mL/min signify possible Chronic Kidney Disease.    Anion gap 6 5 - 15  Glucose, capillary     Status: Abnormal   Collection Time: 09/07/14  8:01 AM  Result Value Ref Range   Glucose-Capillary 128 (H) 65 - 99 mg/dL    MICRO: 7/8 urine  cx yeast 7/8 blood cx yeast x 2  Assessment/Plan:  74yo F with DM, nephrolithiasis who underwent urologic instrumentation for stone extraction and ureteral stent placement developed SIRS found to have candidal UTI and candidemia.  Candidemia = likely related to recent uro instrumentation. Will start her empirically on anidulafungin and d/c low dose fluconazole. Can change once speciation is identified. Will repeat blood cx on 7/11 to see if persistent candidemia that would be concerning for endocarditis.  Sepsis due to urinary source = will discontinue with piptazo and vanco. Suspect it will be due to candidal infection  Health maintenance = will check hiv and hcv

## 2014-09-08 ENCOUNTER — Encounter (HOSPITAL_COMMUNITY): Payer: Self-pay | Admitting: Urology

## 2014-09-08 DIAGNOSIS — R509 Fever, unspecified: Secondary | ICD-10-CM

## 2014-09-08 DIAGNOSIS — N200 Calculus of kidney: Secondary | ICD-10-CM

## 2014-09-08 DIAGNOSIS — Z960 Presence of urogenital implants: Secondary | ICD-10-CM

## 2014-09-08 DIAGNOSIS — E119 Type 2 diabetes mellitus without complications: Secondary | ICD-10-CM

## 2014-09-08 DIAGNOSIS — B377 Candidal sepsis: Principal | ICD-10-CM

## 2014-09-08 LAB — BASIC METABOLIC PANEL
Anion gap: 8 (ref 5–15)
BUN: 26 mg/dL — ABNORMAL HIGH (ref 6–20)
CO2: 18 mmol/L — AB (ref 22–32)
CREATININE: 1.96 mg/dL — AB (ref 0.44–1.00)
Calcium: 7.1 mg/dL — ABNORMAL LOW (ref 8.9–10.3)
Chloride: 103 mmol/L (ref 101–111)
GFR, EST AFRICAN AMERICAN: 28 mL/min — AB (ref >60)
GFR, EST NON AFRICAN AMERICAN: 25 mL/min — AB (ref >60)
Glucose, Bld: 104 mg/dL — ABNORMAL HIGH (ref 65–99)
POTASSIUM: 3.8 mmol/L (ref 3.5–5.1)
Sodium: 129 mmol/L — ABNORMAL LOW (ref 135–145)

## 2014-09-08 LAB — CBC
HCT: 25.5 % — ABNORMAL LOW (ref 36.0–46.0)
Hemoglobin: 8.1 g/dL — ABNORMAL LOW (ref 12.0–15.0)
MCH: 26.4 pg (ref 26.0–34.0)
MCHC: 31.8 g/dL (ref 30.0–36.0)
MCV: 83.1 fL (ref 78.0–100.0)
PLATELETS: 224 10*3/uL (ref 150–400)
RBC: 3.07 MIL/uL — ABNORMAL LOW (ref 3.87–5.11)
RDW: 14.3 % (ref 11.5–15.5)
WBC: 12.4 10*3/uL — AB (ref 4.0–10.5)

## 2014-09-08 LAB — GLUCOSE, CAPILLARY
GLUCOSE-CAPILLARY: 136 mg/dL — AB (ref 65–99)
Glucose-Capillary: 110 mg/dL — ABNORMAL HIGH (ref 65–99)
Glucose-Capillary: 160 mg/dL — ABNORMAL HIGH (ref 65–99)
Glucose-Capillary: 145 mg/dL — ABNORMAL HIGH (ref 65–99)

## 2014-09-08 LAB — HIV ANTIBODY (ROUTINE TESTING W REFLEX): HIV Screen 4th Generation wRfx: NONREACTIVE

## 2014-09-08 NOTE — Progress Notes (Signed)
Patient ID: Ruth Douglas, female   DOB: 1940/08/26, 74 y.o.   MRN: [id number]  Pt feeling much better.   Filed Vitals:   09/08/14 1447  BP:   Pulse:   Temp: 100 F (37.8 C)  Resp:       Intake/Output Summary (Last 24 hours) at 09/08/14 1630 Last data filed at 09/08/14 1500  Gross per 24 hour  Intake 2940.42 ml  Output   1675 ml  Net 1265.42 ml    PE: NAD GU - urine in foley clear    Assessment/plan-Status post left ureteroscopy with sepsis from yeast in urine and blood.  Appreciate hospitalist and infectious disease management. I will d/c foley tomorrow. Plan to leave left ureteral stent a few more days (has a string on it taped to pt thigh to pull it out).

## 2014-09-08 NOTE — Progress Notes (Signed)
[facility] PROGRESS NOTE  [name] QHU:[id number] DOB: Jul 18, 1940 DOA: 09/05/2014 PCP: No PCP Per Patient  Assessment/Plan: Principal Problem:  Sepsis - Currently patient on broad-spectrum IV antibiotics Zosyn and vancomycin.  - Antifungal on board addendum: patient blood culture positive for yeast. Consulted ID who will assist with anti fungal recommendations, please refer to their note for details. - WBC trending down currently on current regimen. Patient showing clinical improvement in condition. - urine and blood cultures pending.  Active Problems:  DM2 (diabetes mellitus, type 2) - Continue diabetic diet and sliding scale insulin with frequent CBG monitoring    CKD (chronic kidney disease) - Monitor serum creatinine, last serum creatinine trending down.   Essential hypertension - Will continue to hold amlodipine   Kidney stone on left side - Patient is status post left ureteroscopy and holmium laser lithotripsy and stent placement. - Urology managing.   Code Status: full Family Communication: no family at bedside.    Procedures:  None  Antibiotics:  Vancomycin and Zosyn  HPI/Subjective: Pt reports feeling better today.  Objective: Filed Vitals:   09/08/14 1447  BP:   Pulse:   Temp: 100 F (37.8 C)  Resp:     Intake/Output Summary (Last 24 hours) at 09/08/14 1509 Last data filed at 09/08/14 1500  Gross per 24 hour  Intake 2940.42 ml  Output   1675 ml  Net 1265.42 ml   Filed Weights   09/05/14 0911 09/05/14 1633  Weight: 64.949 kg (143 lb 3 oz) 65.5 kg (144 lb 6.4 oz)    Exam:   General:  Patient in no acute distress, alert and awake  Cardiovascular: no cyanosis   Respiratory: no increased wob, equal chest rise,  no wheezes  Abdomen: Soft, no guarding, nondistended  Musculoskeletal: Equal tone bilaterally   Data Reviewed: Basic Metabolic Panel:  Recent Labs Lab 09/05/14 1740 09/06/14 0550 09/07/14 0520  09/08/14 0525  NA 137 135 133* 129*  K 3.2* 4.5 4.0 3.8  CL 102 102 105 103  CO2 24 24 22  18*  GLUCOSE 247* 234* 123* 104*  BUN 16 19 24* 26*  CREATININE 2.13* 2.69* 2.02* 1.96*  CALCIUM 8.8* 7.9* 6.6* 7.1*   Liver Function Tests: No results for input(s): AST, ALT, ALKPHOS, BILITOT, PROT, ALBUMIN in the last 168 hours. No results for input(s): LIPASE, AMYLASE in the last 168 hours. No results for input(s): AMMONIA in the last 168 hours. CBC:  Recent Labs Lab 09/05/14 1740 09/06/14 0550 09/07/14 0520 09/07/14 1600 09/08/14 0525  WBC 10.2 19.0* 13.7*  --  12.4*  NEUTROABS 9.6*  --   --   --   --   HGB 9.1* 9.3* 7.4* 7.9* 8.1*  HCT 28.3* 28.9* 23.5* 24.4* 25.5*  MCV 81.1 82.6 83.0  --  83.1  PLT 277 259 205  --  224   Cardiac Enzymes: No results for input(s): CKTOTAL, CKMB, CKMBINDEX, TROPONINI in the last 168 hours. BNP (last 3 results) No results for input(s): BNP in the last 8760 hours.  ProBNP (last 3 results) No results for input(s): PROBNP in the last 8760 hours.  CBG:  Recent Labs Lab 09/07/14 1156 09/07/14 1750 09/07/14 2049 09/08/14 0742 09/08/14 1155  GLUCAP 151* 121* 143* 110* 136*    Recent Results (from the past 240 hour(s))  Urine culture     Status: None   Collection Time: 09/05/14 11:33 AM  Result Value Ref Range Status   Specimen Description URINE, CATHETERIZED CYSTOCOPE  Final  Special Requests NONE  Final   Culture   Final    >=100,000 COLONIES/mL YEAST Performed at [facility]    Report Status 09/07/2014 FINAL  Final  Culture, blood (routine x 2)     Status: None (Preliminary result)   Collection Time: 09/05/14  5:30 PM  Result Value Ref Range Status   Specimen Description BLOOD LEFT HAND  Final   Special Requests BOTTLES DRAWN AEROBIC ONLY 4CC  Final   Culture  Setup Time   Final    YEAST AEROBIC BOTTLE ONLY CRITICAL RESULT CALLED TO, READ BACK BY AND VERIFIED WITH: Tillman Sers RN [phone] GREEN R    Culture  YEAST Performed at [facility]   Final   Report Status PENDING  Incomplete  Culture, blood (routine x 2)     Status: None (Preliminary result)   Collection Time: 09/05/14  5:40 PM  Result Value Ref Range Status   Specimen Description BLOOD LEFT ARM  Final   Special Requests BOTTLES DRAWN AEROBIC AND ANAEROBIC 10CC  Final   Culture  Setup Time   Final    YEAST AEROBIC BOTTLE ONLY CRITICAL RESULT CALLED TO, READ BACK BY AND VERIFIED WITH: Loyal Buba AT 2325 09/06/14 BY RGREEN    Culture YEAST Performed at [facility]   Final   Report Status PENDING  Incomplete     Studies: No results found.  Scheduled Meds: . anidulafungin  100 mg Intravenous Q24H  . docusate sodium  100 mg Oral BID  . insulin aspart  0-15 Units Subcutaneous TID WC  . insulin aspart  0-5 Units Subcutaneous QHS   Continuous Infusions: . sodium chloride 125 mL/hr at 09/08/14 1349    Principal Problem:   Sepsis Active Problems:   DM2 (diabetes mellitus, type 2)   CKD (chronic kidney disease)   Essential hypertension   Kidney stone on left side    Time spent: > 35 minutes    Velvet Bathe  [facility] Pager [phone]. If 7PM-7AM, please contact night-coverage at [email or web address], password [facility] 09/08/2014, 3:09 PM  LOS: 3 days

## 2014-09-08 NOTE — Progress Notes (Signed)
INFECTIOUS DISEASE PROGRESS NOTE  ID: Ruth Douglas is a 74 y.o. female with  Principal Problem:   Sepsis Active Problems:   DM2 (diabetes mellitus, type 2)   CKD (chronic kidney disease)   Essential hypertension   Kidney stone on left side  Subjective: Feels better Pain at catheter site  Abtx:  Anti-infectives    Start     Dose/Rate Route Frequency Ordered Stop   09/08/14 0800  anidulafungin (ERAXIS) 100 mg in sodium chloride 0.9 % 100 mL IVPB     100 mg over 90 Minutes Intravenous Every 24 hours 09/07/14 1136     09/07/14 1800  vancomycin (VANCOCIN) IVPB 1000 mg/200 mL premix  Status:  Discontinued     1,000 mg 200 mL/hr over 60 Minutes Intravenous Every 48 hours 09/06/14 1303 09/07/14 1136   09/07/14 1400  fluconazole (DIFLUCAN) IVPB 400 mg  Status:  Discontinued     400 mg 100 mL/hr over 120 Minutes Intravenous Every 24 hours 09/07/14 1134 09/07/14 1136   09/07/14 1215  anidulafungin (ERAXIS) 200 mg in sodium chloride 0.9 % 200 mL IVPB     200 mg over 180 Minutes Intravenous  Once 09/07/14 1136 09/07/14 1522   09/06/14 1600  vancomycin (VANCOCIN) IVPB 750 mg/150 ml premix  Status:  Discontinued     750 mg 150 mL/hr over 60 Minutes Intravenous Every 24 hours 09/05/14 1709 09/06/14 1256   09/06/14 1400  piperacillin-tazobactam (ZOSYN) IVPB 2.25 g  Status:  Discontinued     2.25 g 100 mL/hr over 30 Minutes Intravenous 3 times per day 09/06/14 0908 09/07/14 1659   09/06/14 1400  fluconazole (DIFLUCAN) IVPB 100 mg  Status:  Discontinued     100 mg 50 mL/hr over 60 Minutes Intravenous Every 24 hours 09/06/14 1307 09/07/14 1134   09/06/14 0000  piperacillin-tazobactam (ZOSYN) IVPB 3.375 g  Status:  Discontinued     3.375 g 12.5 mL/hr over 240 Minutes Intravenous Every 8 hours 09/05/14 1709 09/06/14 0907   09/05/14 1730  vancomycin (VANCOCIN) IVPB 1000 mg/200 mL premix     1,000 mg 200 mL/hr over 60 Minutes Intravenous  Once 09/05/14 1709 09/05/14 1839   09/05/14 1730   piperacillin-tazobactam (ZOSYN) IVPB 3.375 g     3.375 g 100 mL/hr over 30 Minutes Intravenous  Once 09/05/14 1709 09/05/14 1809   09/05/14 0903  ceFAZolin (ANCEF) IVPB 2 g/50 mL premix     2 g 100 mL/hr over 30 Minutes Intravenous 30 min pre-op 09/05/14 0903 09/05/14 1120      Medications:  Scheduled: . anidulafungin  100 mg Intravenous Q24H  . docusate sodium  100 mg Oral BID  . insulin aspart  0-15 Units Subcutaneous TID WC  . insulin aspart  0-5 Units Subcutaneous QHS    Objective: Vital signs in last 24 hours: Temp:  [98.4 F (36.9 C)-101.9 F (38.8 C)] 100 F (37.8 C) (07/11 1447) Pulse Rate:  [86-106] 88 (07/11 1336) Resp:  [16-18] 18 (07/11 1336) BP: (115-146)/(53-62) 146/53 mmHg (07/11 1336) SpO2:  [95 %-99 %] 98 % (07/11 1336)   General appearance: alert, cooperative and no distress Resp: clear to auscultation bilaterally Cardio: regular rate and rhythm GI: normal findings: bowel sounds normal and soft, non-tender  Lab Results  Recent Labs  09/07/14 0520 09/07/14 1600 09/08/14 0525  WBC 13.7*  --  12.4*  HGB 7.4* 7.9* 8.1*  HCT 23.5* 24.4* 25.5*  NA 133*  --  129*  K 4.0  --  3.8  CL 105  --  103  CO2 22  --  18*  BUN 24*  --  26*  CREATININE 2.02*  --  1.96*   Liver Panel No results for input(s): PROT, ALBUMIN, AST, ALT, ALKPHOS, BILITOT, BILIDIR, IBILI in the last 72 hours. Sedimentation Rate No results for input(s): ESRSEDRATE in the last 72 hours. C-Reactive Protein No results for input(s): CRP in the last 72 hours.  Microbiology: Recent Results (from the past 240 hour(s))  Urine culture     Status: None   Collection Time: 09/05/14 11:33 AM  Result Value Ref Range Status   Specimen Description URINE, CATHETERIZED CYSTOCOPE  Final   Special Requests NONE  Final   Culture   Final    >=100,000 COLONIES/mL YEAST Performed at [facility]    Report Status 09/07/2014 FINAL  Final  Culture, blood (routine x 2)     Status: None  (Preliminary result)   Collection Time: 09/05/14  5:30 PM  Result Value Ref Range Status   Specimen Description BLOOD LEFT HAND  Final   Special Requests BOTTLES DRAWN AEROBIC ONLY 4CC  Final   Culture  Setup Time   Final    YEAST AEROBIC BOTTLE ONLY CRITICAL RESULT CALLED TO, READ BACK BY AND VERIFIED WITH: Tillman Sers RN [phone] 2325 GREEN R    Culture YEAST Performed at [facility]   Final   Report Status PENDING  Incomplete  Culture, blood (routine x 2)     Status: None (Preliminary result)   Collection Time: 09/05/14  5:40 PM  Result Value Ref Range Status   Specimen Description BLOOD LEFT ARM  Final   Special Requests BOTTLES DRAWN AEROBIC AND ANAEROBIC 10CC  Final   Culture  Setup Time   Final    YEAST AEROBIC BOTTLE ONLY CRITICAL RESULT CALLED TO, READ BACK BY AND VERIFIED WITH: Loyal Buba AT 2325 09/06/14 BY RGREEN    Culture YEAST Performed at [facility]   Final   Report Status PENDING  Incomplete    Studies/Results: No results found.   Assessment/Plan: Candidemia Nephrolithiasis Ureteral stent  Total days of antibiotics: 2 anidulafungin  still febrile, not unexpected as this is just her 2nd dose of antifungals today Hopefully stent not source/infected Await repeat BCx WBC better         Bobby Rumpf Infectious Diseases (pager) [phone] [email or web address] 09/08/2014, 4:57 PM  LOS: 3 days

## 2014-09-08 NOTE — Progress Notes (Signed)
PT Cancellation Note  Patient Details Name: Ruth Douglas MRN: [id number] DOB: March 03, 1940   Cancelled Treatment:    Reason Eval/Treat Not Completed: Attempted PT eval x 2 on today. Pt reported not feeling well/still nauseous. Pt requested PT check back on tomorrow to complete PT eval. Will check back as schedule permits. Thanks.    Weston Anna, MPT Pager: [phone]

## 2014-09-08 NOTE — Care Management (Signed)
Important Message  Patient Details  Name: Ruth Douglas MRN: [id number] Date of Birth: [birth year]   Medicare Important Message Given:       Camillo Flaming 09/08/2014, 12:41 PM

## 2014-09-09 DIAGNOSIS — B379 Candidiasis, unspecified: Secondary | ICD-10-CM

## 2014-09-09 DIAGNOSIS — E1169 Type 2 diabetes mellitus with other specified complication: Secondary | ICD-10-CM

## 2014-09-09 DIAGNOSIS — R110 Nausea: Secondary | ICD-10-CM

## 2014-09-09 LAB — HEPATITIS C ANTIBODY: HCV Ab: <0.1 s/co ratio (ref 0.0–0.9)

## 2014-09-09 LAB — GLUCOSE, CAPILLARY
GLUCOSE-CAPILLARY: 131 mg/dL — AB (ref 65–99)
Glucose-Capillary: 126 mg/dL — ABNORMAL HIGH (ref 65–99)
Glucose-Capillary: 146 mg/dL — ABNORMAL HIGH (ref 65–99)
Glucose-Capillary: 135 mg/dL — ABNORMAL HIGH (ref 65–99)

## 2014-09-09 LAB — CULTURE, BLOOD (ROUTINE X 2)

## 2014-09-09 MED ORDER — PANTOPRAZOLE SODIUM 40 MG PO TBEC
40.0000 mg | DELAYED_RELEASE_TABLET | Freq: Once | ORAL | Status: AC
  Administered 2014-09-09: 40 mg via ORAL
  Filled 2014-09-09: qty 1

## 2014-09-09 NOTE — Care Management Important Message (Signed)
Important Message  Patient Details  Name: Ruth Douglas MRN: [id number] Date of Birth: 17-Sep-1940   Medicare Important Message Given:  [facility] notification given    Camillo Flaming 09/09/2014, 11:58 AMImportant Message  Patient Details  Name: Ruth Douglas MRN: [id number] Date of Birth: [birth year]   Medicare Important Message Given:  Yes-second notification given    Camillo Flaming 09/09/2014, 11:58 AM

## 2014-09-09 NOTE — Evaluation (Signed)
Physical Therapy Evaluation Patient Details Name: Ruth Douglas MRN: [id number] DOB: [birth year] Today's Date: 09/09/2014   History of Present Illness  74 yo female admitted with sepsis. S/P cystoscopy, left ureteroscopy, holmium laser lithotripsy, stone basket extraction and left ureteral stent placement with tether 09/05/14  Clinical Impression  On eval, pt required Min guard assist for mobility-able to ambulate ~100 feet with RW. Pt is a little weaker than baseline and needed RW for ambulation on today. Pt states she has a walker available at home and that family can assist as needed. Recommend daily ambulation in hallway with nursing supervision. Will follow and progress activity as able.     Follow Up Recommendations No PT follow up;Supervision for mobility/OOB    Equipment Recommendations  None recommended by PT    Recommendations for Other Services       Precautions / Restrictions Precautions Precautions: Fall Precaution Comments: stent tethered to thigh Restrictions Weight Bearing Restrictions: No      Mobility  Bed Mobility               General bed mobility comments: pt oob in recliner  Transfers Overall transfer level: Needs assistance Equipment used: Rolling walker (2 wheeled) Transfers: Sit to/from Stand Sit to Stand: Min guard         General transfer comment: close guard for safety  Ambulation/Gait Ambulation/Gait assistance: Min guard Ambulation Distance (Feet): 100 Feet Assistive device: Rolling walker (2 wheeled) Gait Pattern/deviations: Step-through pattern;Decreased stride length;Trunk flexed     General Gait Details: slow gait speed. close guard for safety. Pt intermittenly bumped into door/door frames. VCs safety. Tends to keep RW too far ahead.   Stairs            Wheelchair Mobility    Modified Rankin (Stroke Patients Only)       Balance Overall balance assessment: Needs assistance         Standing balance support:  Bilateral upper extremity supported;During functional activity Standing balance-Leahy Scale: Poor Standing balance comment: needed RW for support                             Pertinent Vitals/Pain Pain Assessment: Faces Faces Pain Scale: Hurts little more Pain Location: sore where catheter was Pain Descriptors / Indicators: Sore    Home Living Family/patient expects to be discharged to:: Private residence Living Arrangements: Children Available Help at Discharge: Family Type of Home: Mobile home Home Access: Stairs to enter Entrance Stairs-Rails: Right Entrance Stairs-Number of Steps: 2 Home Layout: One level Home Equipment: Cane - single point;Walker - 2 wheels;Wheelchair - manual      Prior Function Level of Independence: Independent         Comments: No longer drives. Community ambulator     Journalist, newspaper        Extremity/Trunk Assessment   Upper Extremity Assessment: Generalized weakness           Lower Extremity Assessment: Generalized weakness      Cervical / Trunk Assessment: Kyphotic  Communication   Communication: No difficulties  Cognition Arousal/Alertness: Awake/alert Behavior During Therapy: WFL for tasks assessed/performed Overall Cognitive Status: Within Functional Limits for tasks assessed                      General Comments      Exercises        Assessment/Plan    PT Assessment Patient needs continued PT services  PT Diagnosis Difficulty walking;Generalized weakness   PT Problem List Decreased strength;Decreased activity tolerance;Decreased mobility;Decreased balance;Decreased knowledge of use of DME  PT Treatment Interventions DME instruction;Gait training;Functional mobility training;Therapeutic activities;Therapeutic exercise;Patient/family education;Balance training   PT Goals (Current goals can be found in the Care Plan section) Acute Rehab PT Goals Patient Stated Goal: home soon PT Goal Formulation:  With patient Time For Goal Achievement: 09/23/14 Potential to Achieve Goals: Good    Frequency Min 3X/week   Barriers to discharge        Co-evaluation               End of Session   Activity Tolerance: Patient tolerated treatment well Patient left: in chair;with call bell/phone within reach           Time: 1046-1107 PT Time Calculation (min) (ACUTE ONLY): 21 min   Charges:   PT Evaluation $Initial PT Evaluation Tier I: 1 Procedure     PT G Codes:        Weston Anna, MPT Pager: [phone]

## 2014-09-09 NOTE — Progress Notes (Signed)
  No complaints. Walking halls. Still febrile. WBC trending down.   Foley out. Voiding well.   Ureteral stent in place. I can remove when patient stable (has a string).    Will follow.

## 2014-09-09 NOTE — Progress Notes (Signed)
[facility] PROGRESS NOTE  Ruth Douglas KVQ:[id number] DOB: 1940-09-09 DOA: 09/05/2014 PCP: No PCP Per Patient  Assessment/Plan: Principal Problem:  Sepsis - Infectious disease currently on board and managing - Repeat blood cultures 09/08/2014 - Currently patient on anti-fungal  Active Problems:  DM2 (diabetes mellitus, type 2) - Continue diabetic diet and sliding scale insulin while in house. Once discharged would place on diabetic diet with instructions for patient to follow-up with primary care physician for further evaluation recommendations   CKD (chronic kidney disease) - Monitor serum creatinine, last serum creatinine trending down. - Serum creatinine currently trending down  Hyponatremia - Suspect secondary to poor oral solute intake. Suspect improvement in sodium levels with improved oral intake.   Essential hypertension - Will continue to hold amlodipine   Kidney stone on left side - Patient is status post left ureteroscopy and holmium laser lithotripsy and stent placement. - Urology managing.  Signing off will be available for any questions Or reconsult if felt necessary  Code Status: full Family Communication: no family at bedside.    Procedures:  None  Anti infectives  Eraxis  HPI/Subjective: Pt reports feeling better today. Was complaining of some reflux  Objective: Filed Vitals:   09/09/14 1401  BP: 171/66  Pulse: 91  Temp: 99.2 F (37.3 C)  Resp: 18    Intake/Output Summary (Last 24 hours) at 09/09/14 1743 Last data filed at 09/09/14 0909  Gross per 24 hour  Intake    480 ml  Output   1500 ml  Net  -1020 ml   Filed Weights   09/05/14 0911 09/05/14 1633  Weight: 64.949 kg (143 lb 3 oz) 65.5 kg (144 lb 6.4 oz)    Exam:   General:  Patient in no acute distress, alert and awake  Cardiovascular: no cyanosis   Respiratory: no increased wob, equal chest rise,  no wheezes  Abdomen: Soft, no guarding,  nondistended  Musculoskeletal: Equal tone bilaterally   Data Reviewed: Basic Metabolic Panel:  Recent Labs Lab 09/05/14 1740 09/06/14 0550 09/07/14 0520 09/08/14 0525  NA 137 135 133* 129*  K 3.2* 4.5 4.0 3.8  CL 102 102 105 103  CO2 24 24 22  18*  GLUCOSE 247* 234* 123* 104*  BUN 16 19 24* 26*  CREATININE 2.13* 2.69* 2.02* 1.96*  CALCIUM 8.8* 7.9* 6.6* 7.1*   Liver Function Tests: No results for input(s): AST, ALT, ALKPHOS, BILITOT, PROT, ALBUMIN in the last 168 hours. No results for input(s): LIPASE, AMYLASE in the last 168 hours. No results for input(s): AMMONIA in the last 168 hours. CBC:  Recent Labs Lab 09/05/14 1740 09/06/14 0550 09/07/14 0520 09/07/14 1600 09/08/14 0525  WBC 10.2 19.0* 13.7*  --  12.4*  NEUTROABS 9.6*  --   --   --   --   HGB 9.1* 9.3* 7.4* 7.9* 8.1*  HCT 28.3* 28.9* 23.5* 24.4* 25.5*  MCV 81.1 82.6 83.0  --  83.1  PLT 277 259 205  --  224   Cardiac Enzymes: No results for input(s): CKTOTAL, CKMB, CKMBINDEX, TROPONINI in the last 168 hours. BNP (last 3 results) No results for input(s): BNP in the last 8760 hours.  ProBNP (last 3 results) No results for input(s): PROBNP in the last 8760 hours.  CBG:  Recent Labs Lab 09/08/14 1707 09/08/14 2110 09/09/14 0804 09/09/14 1204 09/09/14 1705  GLUCAP 160* 145* 131* 126* 146*    Recent Results (from the past 240 hour(s))  Urine culture     Status:  None   Collection Time: 09/05/14 11:33 AM  Result Value Ref Range Status   Specimen Description URINE, CATHETERIZED CYSTOCOPE  Final   Special Requests NONE  Final   Culture   Final    >=100,000 COLONIES/mL YEAST Performed at [facility]    Report Status 09/07/2014 FINAL  Final  Culture, blood (routine x 2)     Status: None   Collection Time: 09/05/14  5:30 PM  Result Value Ref Range Status   Specimen Description BLOOD LEFT HAND  Final   Special Requests BOTTLES DRAWN AEROBIC ONLY 4CC  Final   Culture  Setup Time   Final     YEAST AEROBIC BOTTLE ONLY CRITICAL RESULT CALLED TO, READ BACK BY AND VERIFIED WITHTillman Sers RN [phone] GREEN R    Culture CANDIDA KRUSEI Performed at [facility]   Final   Report Status 09/09/2014 FINAL  Final  Culture, blood (routine x 2)     Status: None   Collection Time: 09/05/14  5:40 PM  Result Value Ref Range Status   Specimen Description BLOOD LEFT ARM  Final   Special Requests BOTTLES DRAWN AEROBIC AND ANAEROBIC 10CC  Final   Culture  Setup Time   Final    YEAST AEROBIC BOTTLE ONLY CRITICAL RESULT CALLED TO, READ BACK BY AND VERIFIED WITH: Loyal Buba AT 2325 09/06/14 BY RGREEN    Culture CANDIDA KRUSEI Performed at [facility]   Final   Report Status 09/09/2014 FINAL  Final  Culture, blood (routine x 2)     Status: None (Preliminary result)   Collection Time: 09/08/14  5:20 AM  Result Value Ref Range Status   Specimen Description BLOOD RIGHT ARM  Final   Special Requests BOTTLES DRAWN AEROBIC ONLY 0.5CC  Final   Culture   Final    NO GROWTH 1 DAY Performed at [facility]    Report Status PENDING  Incomplete  Culture, blood (routine x 2)     Status: None (Preliminary result)   Collection Time: 09/08/14  5:25 AM  Result Value Ref Range Status   Specimen Description BLOOD RIGHT HAND  Final   Special Requests BOTTLES DRAWN AEROBIC ONLY [place]  Final   Culture   Final    NO GROWTH 1 DAY Performed at [facility]    Report Status PENDING  Incomplete     Studies: No results found.  Scheduled Meds: . anidulafungin  100 mg Intravenous Q24H  . docusate sodium  100 mg Oral BID  . insulin aspart  0-15 Units Subcutaneous TID WC  . insulin aspart  0-5 Units Subcutaneous QHS   Continuous Infusions: . sodium chloride 125 mL/hr at 09/09/14 4166    Principal Problem:   Sepsis Active Problems:   DM2 (diabetes mellitus, type 2)   CKD (chronic kidney disease)   Essential hypertension   Kidney stone on left side    Time spent: >  25 minutes    Velvet Bathe  [facility] Pager [phone]. If 7PM-7AM, please contact night-coverage at [email or web address], password [facility] 09/09/2014, 5:43 PM  LOS: 4 days

## 2014-09-09 NOTE — Progress Notes (Addendum)
INFECTIOUS DISEASE PROGRESS NOTE  ID: Ruth Douglas is a 74 y.o. female with  Principal Problem:   Sepsis Active Problems:   DM2 (diabetes mellitus, type 2)   CKD (chronic kidney disease)   Essential hypertension   Kidney stone on left side  Subjective: C/o nausea, dry heaves  Abtx:  Anti-infectives    Start     Dose/Rate Route Frequency Ordered Stop   09/08/14 0800  anidulafungin (ERAXIS) 100 mg in sodium chloride 0.9 % 100 mL IVPB     100 mg over 90 Minutes Intravenous Every 24 hours 09/07/14 1136     09/07/14 1800  vancomycin (VANCOCIN) IVPB 1000 mg/200 mL premix  Status:  Discontinued     1,000 mg 200 mL/hr over 60 Minutes Intravenous Every 48 hours 09/06/14 1303 09/07/14 1136   09/07/14 1400  fluconazole (DIFLUCAN) IVPB 400 mg  Status:  Discontinued     400 mg 100 mL/hr over 120 Minutes Intravenous Every 24 hours 09/07/14 1134 09/07/14 1136   09/07/14 1215  anidulafungin (ERAXIS) 200 mg in sodium chloride 0.9 % 200 mL IVPB     200 mg over 180 Minutes Intravenous  Once 09/07/14 1136 09/07/14 1522   09/06/14 1600  vancomycin (VANCOCIN) IVPB 750 mg/150 ml premix  Status:  Discontinued     750 mg 150 mL/hr over 60 Minutes Intravenous Every 24 hours 09/05/14 1709 09/06/14 1256   09/06/14 1400  piperacillin-tazobactam (ZOSYN) IVPB 2.25 g  Status:  Discontinued     2.25 g 100 mL/hr over 30 Minutes Intravenous 3 times per day 09/06/14 0908 09/07/14 1659   09/06/14 1400  fluconazole (DIFLUCAN) IVPB 100 mg  Status:  Discontinued     100 mg 50 mL/hr over 60 Minutes Intravenous Every 24 hours 09/06/14 1307 09/07/14 1134   09/06/14 0000  piperacillin-tazobactam (ZOSYN) IVPB 3.375 g  Status:  Discontinued     3.375 g 12.5 mL/hr over 240 Minutes Intravenous Every 8 hours 09/05/14 1709 09/06/14 0907   09/05/14 1730  vancomycin (VANCOCIN) IVPB 1000 mg/200 mL premix     1,000 mg 200 mL/hr over 60 Minutes Intravenous  Once 09/05/14 1709 09/05/14 1839   09/05/14 1730   piperacillin-tazobactam (ZOSYN) IVPB 3.375 g     3.375 g 100 mL/hr over 30 Minutes Intravenous  Once 09/05/14 1709 09/05/14 1809   09/05/14 0903  ceFAZolin (ANCEF) IVPB 2 g/50 mL premix     2 g 100 mL/hr over 30 Minutes Intravenous 30 min pre-op 09/05/14 0903 09/05/14 1120      Medications:  Scheduled: . anidulafungin  100 mg Intravenous Q24H  . docusate sodium  100 mg Oral BID  . insulin aspart  0-15 Units Subcutaneous TID WC  . insulin aspart  0-5 Units Subcutaneous QHS    Objective: Vital signs in last 24 hours: Temp:  [98.3 F (36.8 C)-101.8 F (38.8 C)] 99.2 F (37.3 C) (07/12 1401) Pulse Rate:  [83-96] 91 (07/12 1401) Resp:  [16-18] 18 (07/12 1401) BP: (134-171)/(59-66) 171/66 mmHg (07/12 1401) SpO2:  [92 %-96 %] 95 % (07/12 1401)   General appearance: alert, cooperative and mild distress Resp: clear to auscultation bilaterally Cardio: regular rate and rhythm GI: normal findings: bowel sounds normal and soft and abnormal findings:  mild supra-pubic tenderness  Lab Results  Recent Labs  09/07/14 0520 09/07/14 1600 09/08/14 0525  WBC 13.7*  --  12.4*  HGB 7.4* 7.9* 8.1*  HCT 23.5* 24.4* 25.5*  NA 133*  --  129*  K 4.0  --  3.8  CL 105  --  103  CO2 22  --  18*  BUN 24*  --  26*  CREATININE 2.02*  --  1.96*   Liver Panel No results for input(s): PROT, ALBUMIN, AST, ALT, ALKPHOS, BILITOT, BILIDIR, IBILI in the last 72 hours. Sedimentation Rate No results for input(s): ESRSEDRATE in the last 72 hours. C-Reactive Protein No results for input(s): CRP in the last 72 hours.  Microbiology: Recent Results (from the past 240 hour(s))  Urine culture     Status: None   Collection Time: 09/05/14 11:33 AM  Result Value Ref Range Status   Specimen Description URINE, CATHETERIZED CYSTOCOPE  Final   Special Requests NONE  Final   Culture   Final    >=100,000 COLONIES/mL YEAST Performed at [facility]    Report Status 09/07/2014 FINAL  Final  Culture,  blood (routine x 2)     Status: None   Collection Time: 09/05/14  5:30 PM  Result Value Ref Range Status   Specimen Description BLOOD LEFT HAND  Final   Special Requests BOTTLES DRAWN AEROBIC ONLY 4CC  Final   Culture  Setup Time   Final    YEAST AEROBIC BOTTLE ONLY CRITICAL RESULT CALLED TO, READ BACK BY AND VERIFIED WITHTillman Sers RN [phone] GREEN R    Culture CANDIDA KRUSEI Performed at [facility]   Final   Report Status 09/09/2014 FINAL  Final  Culture, blood (routine x 2)     Status: None   Collection Time: 09/05/14  5:40 PM  Result Value Ref Range Status   Specimen Description BLOOD LEFT ARM  Final   Special Requests BOTTLES DRAWN AEROBIC AND ANAEROBIC 10CC  Final   Culture  Setup Time   Final    YEAST AEROBIC BOTTLE ONLY CRITICAL RESULT CALLED TO, READ BACK BY AND VERIFIED WITH: Loyal Buba AT 2325 09/06/14 BY RGREEN    Culture CANDIDA KRUSEI Performed at [facility]   Final   Report Status 09/09/2014 FINAL  Final  Culture, blood (routine x 2)     Status: None (Preliminary result)   Collection Time: 09/08/14  5:20 AM  Result Value Ref Range Status   Specimen Description BLOOD RIGHT ARM  Final   Special Requests BOTTLES DRAWN AEROBIC ONLY 0.5CC  Final   Culture   Final    NO GROWTH 1 DAY Performed at [facility]    Report Status PENDING  Incomplete  Culture, blood (routine x 2)     Status: None (Preliminary result)   Collection Time: 09/08/14  5:25 AM  Result Value Ref Range Status   Specimen Description BLOOD RIGHT HAND  Final   Special Requests BOTTLES DRAWN AEROBIC ONLY [place]  Final   Culture   Final    NO GROWTH 1 DAY Performed at [facility]    Report Status PENDING  Incomplete    Studies/Results: No results found.   Assessment/Plan: Candidemia- krusei Nephrolithiasis Ureteral stent nausea  Total days of antibiotics: 3 anidulafungin  continued fever, wbc slighty better repeat BCx 7-11 pending Defer stent  removal to [name] (pager) [phone] [email or web address] 09/09/2014, 5:10 PM  LOS: 4 days

## 2014-09-09 NOTE — Care Management Note (Signed)
Case Management Note  Patient Details  Name: Shaneque Merkle MRN: [id number] Date of Birth: 1941-02-24  Subjective/Objective: 74 y/o f admitted w/Sepsis. From home.PT cons-await recommendations.                   Action/Plan:Monitor progress for d/c needs.   Expected Discharge Date:                  Expected Discharge Plan:  Home/Self Care  In-House Referral:     Discharge planning Services  CM Consult  Post Acute Care Choice:    Choice offered to:     DME Arranged:    DME Agency:     HH Arranged:    HH Agency:     Status of Service:  In process, will continue to follow  Medicare Important Message Given:  Yes-second notification given Date Medicare IM Given:    Medicare IM give by:    Date Additional Medicare IM Given:    Additional Medicare Important Message give by:     If discussed at [place] of Stay Meetings, dates discussed:    Additional Comments:  Dessa Phi, RN 09/09/2014, 12:29 PM

## 2014-09-09 NOTE — Care Management Note (Signed)
Case Management Note  Patient Details  Name: Helane Briceno MRN: [id number] Date of Birth: 20-Jan-1941  Subjective/Objective:   Noted PT -no f/u.                 Action/Plan:No anticipated d/c needs.   Expected Discharge Date:                  Expected Discharge Plan:  Home/Self Care  In-House Referral:     Discharge planning Services  CM Consult  Post Acute Care Choice:    Choice offered to:     DME Arranged:    DME Agency:     HH Arranged:    HH Agency:     Status of Service:  In process, will continue to follow  Medicare Important Message Given:  Yes-second notification given Date Medicare IM Given:    Medicare IM give by:    Date Additional Medicare IM Given:    Additional Medicare Important Message give by:     If discussed at [place] of Stay Meetings, dates discussed:    Additional Comments:  Dessa Phi, RN 09/09/2014, 12:30 PM

## 2014-09-10 DIAGNOSIS — E876 Hypokalemia: Secondary | ICD-10-CM

## 2014-09-10 DIAGNOSIS — N183 Chronic kidney disease, stage 3 (moderate): Secondary | ICD-10-CM

## 2014-09-10 DIAGNOSIS — B377 Candidal sepsis: Secondary | ICD-10-CM | POA: Insufficient documentation

## 2014-09-10 DIAGNOSIS — D649 Anemia, unspecified: Secondary | ICD-10-CM

## 2014-09-10 LAB — IRON AND TIBC
IRON: 11 ug/dL — AB (ref 28–170)
SATURATION RATIOS: 7 % — AB (ref 10.4–31.8)
TIBC: 155 ug/dL — AB (ref 250–450)
UIBC: 144 ug/dL

## 2014-09-10 LAB — GLUCOSE, CAPILLARY
Glucose-Capillary: 122 mg/dL — ABNORMAL HIGH (ref 65–99)
Glucose-Capillary: 180 mg/dL — ABNORMAL HIGH (ref 65–99)
Glucose-Capillary: 212 mg/dL — ABNORMAL HIGH (ref 65–99)

## 2014-09-10 LAB — FERRITIN: Ferritin: 246 ng/mL (ref 11–307)

## 2014-09-10 LAB — CBC WITH DIFFERENTIAL/PLATELET
BASOS ABS: 0 10*3/uL (ref 0.0–0.1)
BASOS PCT: 0 % (ref 0–1)
EOS ABS: 0.1 10*3/uL (ref 0.0–0.7)
Eosinophils Relative: 1 % (ref 0–5)
HCT: 23.7 % — ABNORMAL LOW (ref 36.0–46.0)
HEMOGLOBIN: 7.6 g/dL — AB (ref 12.0–15.0)
Lymphocytes Relative: 15 % (ref 12–46)
Lymphs Abs: 1 10*3/uL (ref 0.7–4.0)
MCH: 25.5 pg — ABNORMAL LOW (ref 26.0–34.0)
MCHC: 32.1 g/dL (ref 30.0–36.0)
MCV: 79.5 fL (ref 78.0–100.0)
MONOS PCT: 8 % (ref 3–12)
Monocytes Absolute: 0.5 10*3/uL (ref 0.1–1.0)
Neutro Abs: 4.9 10*3/uL (ref 1.7–7.7)
Neutrophils Relative %: 76 % (ref 43–77)
Platelets: 204 10*3/uL (ref 150–400)
RBC: 2.98 MIL/uL — ABNORMAL LOW (ref 3.87–5.11)
RDW: 14 % (ref 11.5–15.5)
WBC: 6.5 10*3/uL (ref 4.0–10.5)

## 2014-09-10 LAB — VITAMIN B12: Vitamin B-12: 552 pg/mL (ref 180–914)

## 2014-09-10 LAB — BASIC METABOLIC PANEL
Anion gap: 7 (ref 5–15)
BUN: 12 mg/dL (ref 6–20)
CO2: 19 mmol/L — ABNORMAL LOW (ref 22–32)
Calcium: 7.4 mg/dL — ABNORMAL LOW (ref 8.9–10.3)
Chloride: 107 mmol/L (ref 101–111)
Creatinine, Ser: 1.33 mg/dL — ABNORMAL HIGH (ref 0.44–1.00)
GFR calc Af Amer: 45 mL/min — ABNORMAL LOW (ref >60)
GFR calc non Af Amer: 39 mL/min — ABNORMAL LOW (ref >60)
Glucose, Bld: 173 mg/dL — ABNORMAL HIGH (ref 65–99)
POTASSIUM: 3.2 mmol/L — AB (ref 3.5–5.1)
Sodium: 133 mmol/L — ABNORMAL LOW (ref 135–145)

## 2014-09-10 LAB — FOLATE: Folate: 21.9 ng/mL (ref >5.9)

## 2014-09-10 LAB — RETICULOCYTES
RBC.: 2.76 MIL/uL — AB (ref 3.87–5.11)
RETIC COUNT ABSOLUTE: 16.6 10*3/uL — AB (ref 19.0–186.0)
RETIC CT PCT: 0.6 % (ref 0.4–3.1)

## 2014-09-10 NOTE — Progress Notes (Signed)
[facility]                                                                              Patient Demographics  Ruth Douglas, is a 74 y.o. female, DOB - Apr 06, 1940, ITG:[id number]  Admit date - 09/05/2014   Admitting Physician Festus Aloe, MD  Outpatient Primary MD for the patient is No PCP Per Patient  LOS - 5   No chief complaint on file.       Assessment & Plan   Sepsis secondary to Candidemia -Infectious disease currently on board and managing -Repeat blood cultures 09/08/2014 show no growth to date -Continue Eraxis (patient will need 14day course) -Will order PICC line  Chronic Anemia  -Baseline hemoglobin appears to be 9 -Currently, hemoglobin 7.6 -Drop likely dilutional as patient has been receiving normal saline -Will stop IV fluids, obtain anemia panel, and fecal occult if possible -Continue to monitor CBC  DM2 (diabetes mellitus, type 2) -Continue diabetic diet and sliding scale insulin while hospitalized -Once discharged would place on diabetic diet with instructions for patient to follow-up with primary care physician for further evaluation recommendations -Hemoglobin A1c on 06/19/2014 11.9  CKD (chronic kidney disease), stage 3 - Monitor serum creatinine, last serum creatinine trending down. - Serum creatinine currently trending down  Hyponatremia -Suspect secondary to poor oral solute intake.  -Sodium improving, currently 133  Essential hypertension -Will continue to hold amlodipine.  BP stable  Kidney stone on left side -Patient is status post left ureteroscopy and holmium laser lithotripsy and stent placement. -Urology managing, may pull stent today  Code Status: Full  Family Communication: None at bedside  Disposition Plan: Admitted  Time Spent in minutes   30 minutes  Procedures  Left ureteroscopy  Consults   [facility] Infectious disease  DVT Prophylaxis  SCDs  Lab Results  Component Value Date   PLT 204 09/10/2014     Medications  Scheduled Meds: . anidulafungin  100 mg Intravenous Q24H  . docusate sodium  100 mg Oral BID  . insulin aspart  0-15 Units Subcutaneous TID WC  . insulin aspart  0-5 Units Subcutaneous QHS   Continuous Infusions:  PRN Meds:.acetaminophen, acetaminophen, docusate sodium, fentaNYL (SUBLIMAZE) injection, morphine injection, ondansetron, oxyCODONE-acetaminophen, zolpidem  Antibiotics    Anti-infectives    Start     Dose/Rate Route Frequency Ordered Stop   09/08/14 0800  anidulafungin (ERAXIS) 100 mg in sodium chloride 0.9 % 100 mL IVPB     100 mg over 90 Minutes Intravenous Every 24 hours 09/07/14 1136     09/07/14 1800  vancomycin (VANCOCIN) IVPB 1000 mg/200 mL premix  Status:  Discontinued     1,000 mg 200 mL/hr over 60 Minutes Intravenous Every 48 hours 09/06/14 1303 09/07/14 1136   09/07/14 1400  fluconazole (DIFLUCAN) IVPB 400 mg  Status:  Discontinued     400 mg 100 mL/hr over 120 Minutes Intravenous Every 24 hours 09/07/14 1134 09/07/14 1136   09/07/14 1215  anidulafungin (ERAXIS) 200 mg in sodium chloride 0.9 % 200 mL IVPB     200 mg over 180 Minutes Intravenous  Once 09/07/14 1136 09/07/14 1522   09/06/14 1600  vancomycin (VANCOCIN) IVPB 750 mg/150  ml premix  Status:  Discontinued     750 mg 150 mL/hr over 60 Minutes Intravenous Every 24 hours 09/05/14 1709 09/06/14 1256   09/06/14 1400  piperacillin-tazobactam (ZOSYN) IVPB 2.25 g  Status:  Discontinued     2.25 g 100 mL/hr over 30 Minutes Intravenous 3 times per day 09/06/14 0908 09/07/14 1659   09/06/14 1400  fluconazole (DIFLUCAN) IVPB 100 mg  Status:  Discontinued     100 mg 50 mL/hr over 60 Minutes Intravenous Every 24 hours 09/06/14 1307 09/07/14 1134   09/06/14 0000  piperacillin-tazobactam (ZOSYN) IVPB 3.375 g  Status:  Discontinued     3.375 g 12.5 mL/hr over 240 Minutes Intravenous Every 8 hours 09/05/14 1709 09/06/14 0907   09/05/14 1730  vancomycin (VANCOCIN) IVPB 1000 mg/200 mL premix      1,000 mg 200 mL/hr over 60 Minutes Intravenous  Once 09/05/14 1709 09/05/14 1839   09/05/14 1730  piperacillin-tazobactam (ZOSYN) IVPB 3.375 g     3.375 g 100 mL/hr over 30 Minutes Intravenous  Once 09/05/14 1709 09/05/14 1809   09/05/14 0903  ceFAZolin (ANCEF) IVPB 2 g/50 mL premix     2 g 100 mL/hr over 30 Minutes Intravenous 30 min pre-op 09/05/14 8416 09/05/14 1120      Subjective:   [name] seen and examined today.  Patient has no complaints today.  She feels sleepy.  She denies chest pain, shortness of breath, abdominal pain, nausea or vomiting.  Objective:   Filed Vitals:   09/09/14 2230 09/10/14 0142 09/10/14 0501 09/10/14 1100  BP:  148/63 146/58 131/60  Pulse:  86 82 76  Temp: 99 F (37.2 C) 98.8 F (37.1 C) 98.6 F (37 C) 98.8 F (37.1 C)  TempSrc: Oral Oral Oral Oral  Resp:  16 16 16   Height:      Weight:      SpO2:  93% 94% 95%    Wt Readings from Last 3 Encounters:  09/05/14 65.5 kg (144 lb 6.4 oz)  08/26/14 64.955 kg (143 lb 3.2 oz)  08/10/14 68.947 kg (152 lb)     Intake/Output Summary (Last 24 hours) at 09/10/14 1535 Last data filed at 09/10/14 0800  Gross per 24 hour  Intake    495 ml  Output      0 ml  Net    495 ml    Exam  General: Well developed, well nourished, NAD, appears stated age  17: NCAT,mucous membranes moist.   Cardiovascular: S1 S2 auscultated, no rubs, murmurs or gallops. Regular rate and rhythm.  Respiratory: Clear to auscultation bilaterally with equal chest rise  Abdomen: Soft, nontender, nondistended, + bowel sounds  Extremities: warm dry without cyanosis clubbing or edema  Neuro: AAOx3, nonfocal  Psych: Normal affect and demeanor   Data Review   Micro Results Recent Results (from the past 240 hour(s))  Urine culture     Status: None   Collection Time: 09/05/14 11:33 AM  Result Value Ref Range Status   Specimen Description URINE, CATHETERIZED CYSTOCOPE  Final   Special Requests NONE  Final    Culture   Final    >=100,000 COLONIES/mL YEAST Performed at [facility]    Report Status 09/07/2014 FINAL  Final  Culture, blood (routine x 2)     Status: None   Collection Time: 09/05/14  5:30 PM  Result Value Ref Range Status   Specimen Description BLOOD LEFT HAND  Final   Special Requests BOTTLES DRAWN AEROBIC ONLY 4CC  Final   Culture  Setup Time   Final    YEAST AEROBIC BOTTLE ONLY CRITICAL RESULT CALLED TO, READ BACK BY AND VERIFIED WITH: Tillman Sers RN [phone] GREEN R    Culture CANDIDA KRUSEI Performed at [facility]   Final   Report Status 09/09/2014 FINAL  Final  Culture, blood (routine x 2)     Status: None   Collection Time: 09/05/14  5:40 PM  Result Value Ref Range Status   Specimen Description BLOOD LEFT ARM  Final   Special Requests BOTTLES DRAWN AEROBIC AND ANAEROBIC 10CC  Final   Culture  Setup Time   Final    YEAST AEROBIC BOTTLE ONLY CRITICAL RESULT CALLED TO, READ BACK BY AND VERIFIED WITH: Loyal Buba AT 2325 09/06/14 BY RGREEN    Culture CANDIDA KRUSEI Performed at [facility]   Final   Report Status 09/09/2014 FINAL  Final  Culture, blood (routine x 2)     Status: None (Preliminary result)   Collection Time: 09/08/14  5:20 AM  Result Value Ref Range Status   Specimen Description BLOOD RIGHT ARM  Final   Special Requests BOTTLES DRAWN AEROBIC ONLY 0.5CC  Final   Culture   Final    NO GROWTH 2 DAYS Performed at [facility]    Report Status PENDING  Incomplete  Culture, blood (routine x 2)     Status: None (Preliminary result)   Collection Time: 09/08/14  5:25 AM  Result Value Ref Range Status   Specimen Description BLOOD RIGHT HAND  Final   Special Requests BOTTLES DRAWN AEROBIC ONLY [place]  Final   Culture   Final    NO GROWTH 2 DAYS Performed at [facility]    Report Status PENDING  Incomplete    Radiology Reports No results found.  CBC  Recent Labs Lab 09/05/14 1740 09/06/14 0550 09/07/14 0520  09/07/14 1600 09/08/14 0525 09/10/14 0952  WBC 10.2 19.0* 13.7*  --  12.4* 6.5  HGB 9.1* 9.3* 7.4* 7.9* 8.1* 7.6*  HCT 28.3* 28.9* 23.5* 24.4* 25.5* 23.7*  PLT 277 259 205  --  224 204  MCV 81.1 82.6 83.0  --  83.1 79.5  MCH 26.1 26.6 26.1  --  26.4 25.5*  MCHC 32.2 32.2 31.5  --  31.8 32.1  RDW 13.7 13.8 14.1  --  14.3 14.0  LYMPHSABS 0.3*  --   --   --   --  1.0  MONOABS 0.2  --   --   --   --  0.5  EOSABS 0.1  --   --   --   --  0.1  BASOSABS 0.0  --   --   --   --  0.0    Chemistries   Recent Labs Lab 09/05/14 1740 09/06/14 0550 09/07/14 0520 09/08/14 0525 09/10/14 0952  NA 137 135 133* 129* 133*  K 3.2* 4.5 4.0 3.8 3.2*  CL 102 102 105 103 107  CO2 24 24 22  18* 19*  GLUCOSE 247* 234* 123* 104* 173*  BUN 16 19 24* 26* 12  CREATININE 2.13* 2.69* 2.02* 1.96* 1.33*  CALCIUM 8.8* 7.9* 6.6* 7.1* 7.4*   ------------------------------------------------------------------------------------------------------------------ estimated creatinine clearance is 34.9 mL/min (by C-G formula based on Cr of 1.33). ------------------------------------------------------------------------------------------------------------------ No results for input(s): HGBA1C in the last 72 hours. ------------------------------------------------------------------------------------------------------------------ No results for input(s): CHOL, HDL, LDLCALC, TRIG, CHOLHDL, LDLDIRECT in the last 72 hours. ------------------------------------------------------------------------------------------------------------------ No results for input(s): TSH, T4TOTAL, T3FREE, THYROIDAB  in the last 72 hours.  Invalid input(s): FREET3 ------------------------------------------------------------------------------------------------------------------  Recent Labs  09/10/14 1350  RETICCTPCT 0.6    Coagulation profile No results for input(s): INR, PROTIME in the last 168 hours.  No results for input(s): DDIMER in the  last 72 hours.  Cardiac Enzymes No results for input(s): CKMB, TROPONINI, MYOGLOBIN in the last 168 hours.  Invalid input(s): CK ------------------------------------------------------------------------------------------------------------------ Invalid input(s): POCBNP    Naeem Quillin D.O. on 09/10/2014 at 3:35 PM  Between 7am to 7pm - Pager - [phone]  After 7pm go to [email or web address] - password [email or web address]  And look for the night coverage person covering for me after hours  [facility] Office  [phone]

## 2014-09-10 NOTE — Progress Notes (Signed)
Still with mild fever but temp curve improving. She has no complaints. Eating better and ambulating.   Filed Vitals:   09/10/14 1100  BP: 131/60  Pulse: 76  Temp: 98.8 F (37.1 C)  Resp: 16     WBC normalized, anemic Bun and Cr baseline   A/P -  1) fungal UTI, fungemia -- appreciate ID input. Current blood cultures negative. Will send home when ready from ID standpoint.  2) Status post left ureteroscopy - I'll remove the ureteral stent when she is stable and afebrile.  Hopefully in the morning. 3) anemia - IVF d/c'd. Appreciate Dr. Aldine Contes input.

## 2014-09-10 NOTE — Progress Notes (Signed)
INFECTIOUS DISEASE PROGRESS NOTE  ID: Ruth Douglas is a 74 y.o. female with  Principal Problem:   Sepsis Active Problems:   DM2 (diabetes mellitus, type 2)   CKD (chronic kidney disease)   Essential hypertension   Kidney stone on left side  Subjective: Feels better, eating today. No nausea.  Some continued supra-pubic pain  Abtx:  Anti-infectives    Start     Dose/Rate Route Frequency Ordered Stop   09/08/14 0800  anidulafungin (ERAXIS) 100 mg in sodium chloride 0.9 % 100 mL IVPB     100 mg over 90 Minutes Intravenous Every 24 hours 09/07/14 1136     09/07/14 1800  vancomycin (VANCOCIN) IVPB 1000 mg/200 mL premix  Status:  Discontinued     1,000 mg 200 mL/hr over 60 Minutes Intravenous Every 48 hours 09/06/14 1303 09/07/14 1136   09/07/14 1400  fluconazole (DIFLUCAN) IVPB 400 mg  Status:  Discontinued     400 mg 100 mL/hr over 120 Minutes Intravenous Every 24 hours 09/07/14 1134 09/07/14 1136   09/07/14 1215  anidulafungin (ERAXIS) 200 mg in sodium chloride 0.9 % 200 mL IVPB     200 mg over 180 Minutes Intravenous  Once 09/07/14 1136 09/07/14 1522   09/06/14 1600  vancomycin (VANCOCIN) IVPB 750 mg/150 ml premix  Status:  Discontinued     750 mg 150 mL/hr over 60 Minutes Intravenous Every 24 hours 09/05/14 1709 09/06/14 1256   09/06/14 1400  piperacillin-tazobactam (ZOSYN) IVPB 2.25 g  Status:  Discontinued     2.25 g 100 mL/hr over 30 Minutes Intravenous 3 times per day 09/06/14 0908 09/07/14 1659   09/06/14 1400  fluconazole (DIFLUCAN) IVPB 100 mg  Status:  Discontinued     100 mg 50 mL/hr over 60 Minutes Intravenous Every 24 hours 09/06/14 1307 09/07/14 1134   09/06/14 0000  piperacillin-tazobactam (ZOSYN) IVPB 3.375 g  Status:  Discontinued     3.375 g 12.5 mL/hr over 240 Minutes Intravenous Every 8 hours 09/05/14 1709 09/06/14 0907   09/05/14 1730  vancomycin (VANCOCIN) IVPB 1000 mg/200 mL premix     1,000 mg 200 mL/hr over 60 Minutes Intravenous  Once 09/05/14  1709 09/05/14 1839   09/05/14 1730  piperacillin-tazobactam (ZOSYN) IVPB 3.375 g     3.375 g 100 mL/hr over 30 Minutes Intravenous  Once 09/05/14 1709 09/05/14 1809   09/05/14 0903  ceFAZolin (ANCEF) IVPB 2 g/50 mL premix     2 g 100 mL/hr over 30 Minutes Intravenous 30 min pre-op 09/05/14 0903 09/05/14 1120      Medications:  Scheduled: . anidulafungin  100 mg Intravenous Q24H  . docusate sodium  100 mg Oral BID  . insulin aspart  0-15 Units Subcutaneous TID WC  . insulin aspart  0-5 Units Subcutaneous QHS    Objective: Vital signs in last 24 hours: Temp:  [98.4 F (36.9 C)-100.9 F (38.3 C)] 98.4 F (36.9 C) (07/13 1605) Pulse Rate:  [74-89] 74 (07/13 1605) Resp:  [16] 16 (07/13 1605) BP: (131-148)/(52-63) 142/52 mmHg (07/13 1605) SpO2:  [93 %-97 %] 97 % (07/13 1605)   General appearance: alert, cooperative and no distress Resp: clear to auscultation bilaterally Cardio: regular rate and rhythm GI: normal findings: bowel sounds normal and soft, non-tender  Lab Results  Recent Labs  09/08/14 0525 09/10/14 0952  WBC 12.4* 6.5  HGB 8.1* 7.6*  HCT 25.5* 23.7*  NA 129* 133*  K 3.8 3.2*  CL 103 107  CO2 18* 19*  BUN 26* 12  CREATININE 1.96* 1.33*   Liver Panel No results for input(s): PROT, ALBUMIN, AST, ALT, ALKPHOS, BILITOT, BILIDIR, IBILI in the last 72 hours. Sedimentation Rate No results for input(s): ESRSEDRATE in the last 72 hours. C-Reactive Protein No results for input(s): CRP in the last 72 hours.  Microbiology: Recent Results (from the past 240 hour(s))  Urine culture     Status: None   Collection Time: 09/05/14 11:33 AM  Result Value Ref Range Status   Specimen Description URINE, CATHETERIZED CYSTOCOPE  Final   Special Requests NONE  Final   Culture   Final    >=100,000 COLONIES/mL YEAST Performed at [facility]    Report Status 09/07/2014 FINAL  Final  Culture, blood (routine x 2)     Status: None   Collection Time: 09/05/14  5:30  PM  Result Value Ref Range Status   Specimen Description BLOOD LEFT HAND  Final   Special Requests BOTTLES DRAWN AEROBIC ONLY 4CC  Final   Culture  Setup Time   Final    YEAST AEROBIC BOTTLE ONLY CRITICAL RESULT CALLED TO, READ BACK BY AND VERIFIED WITHTillman Sers RN [phone] GREEN R    Culture CANDIDA KRUSEI Performed at [facility]   Final   Report Status 09/09/2014 FINAL  Final  Culture, blood (routine x 2)     Status: None   Collection Time: 09/05/14  5:40 PM  Result Value Ref Range Status   Specimen Description BLOOD LEFT ARM  Final   Special Requests BOTTLES DRAWN AEROBIC AND ANAEROBIC 10CC  Final   Culture  Setup Time   Final    YEAST AEROBIC BOTTLE ONLY CRITICAL RESULT CALLED TO, READ BACK BY AND VERIFIED WITH: Loyal Buba AT 2325 09/06/14 BY RGREEN    Culture CANDIDA KRUSEI Performed at [facility]   Final   Report Status 09/09/2014 FINAL  Final  Culture, blood (routine x 2)     Status: None (Preliminary result)   Collection Time: 09/08/14  5:20 AM  Result Value Ref Range Status   Specimen Description BLOOD RIGHT ARM  Final   Special Requests BOTTLES DRAWN AEROBIC ONLY 0.5CC  Final   Culture   Final    NO GROWTH 2 DAYS Performed at [facility]    Report Status PENDING  Incomplete  Culture, blood (routine x 2)     Status: None (Preliminary result)   Collection Time: 09/08/14  5:25 AM  Result Value Ref Range Status   Specimen Description BLOOD RIGHT HAND  Final   Special Requests BOTTLES DRAWN AEROBIC ONLY [place]  Final   Culture   Final    NO GROWTH 2 DAYS Performed at [facility]    Report Status PENDING  Incomplete    Studies/Results: No results found.   Assessment/Plan: Candidemia- krusei Nephrolithiasis Ureteral stent HypoKalemia Anemia  Total days of antibiotics: 4 anidulafungin  Stent to be taken out in AM per pt No fever last 12h WBC better Cr better Repeat BCx are ngtd Agree with PIC Aim for 2 weeks of  anti-fungal rx         Bobby Rumpf Infectious Diseases (pager) [phone] [email or web address] 09/10/2014, 4:12 PM  LOS: 5 days

## 2014-09-10 NOTE — Progress Notes (Signed)
Received a referral for [facility] needs. Patient is to have a picc placed and will need 2 weeks IV abx. Spoke with patient at bedside, she has use [facility] in the past and would like to use them again. Patient awaiting picc placement, contacted [facility] at [phone]. They will be available for start of service on over the weekend or maybe Monday. Patient agreeable to continued search if d/ced prior to then. Awaiting final orders for medication and [facility] RN.

## 2014-09-11 ENCOUNTER — Inpatient Hospital Stay (HOSPITAL_COMMUNITY): Payer: Medicare FFS

## 2014-09-11 ENCOUNTER — Encounter (HOSPITAL_COMMUNITY): Payer: Self-pay | Admitting: Radiology

## 2014-09-11 DIAGNOSIS — Z959 Presence of cardiac and vascular implant and graft, unspecified: Secondary | ICD-10-CM

## 2014-09-11 DIAGNOSIS — R509 Fever, unspecified: Secondary | ICD-10-CM | POA: Insufficient documentation

## 2014-09-11 LAB — BASIC METABOLIC PANEL
Anion gap: 5 (ref 5–15)
BUN: 9 mg/dL (ref 6–20)
CHLORIDE: 106 mmol/L (ref 101–111)
CO2: 24 mmol/L (ref 22–32)
Calcium: 7.6 mg/dL — ABNORMAL LOW (ref 8.9–10.3)
Creatinine, Ser: 1.27 mg/dL — ABNORMAL HIGH (ref 0.44–1.00)
GFR calc Af Amer: 48 mL/min — ABNORMAL LOW (ref >60)
GFR, EST NON AFRICAN AMERICAN: 41 mL/min — AB (ref >60)
Glucose, Bld: 186 mg/dL — ABNORMAL HIGH (ref 65–99)
POTASSIUM: 3.2 mmol/L — AB (ref 3.5–5.1)
SODIUM: 135 mmol/L (ref 135–145)

## 2014-09-11 LAB — CBC
HCT: 21.6 % — ABNORMAL LOW (ref 36.0–46.0)
Hemoglobin: 7.3 g/dL — ABNORMAL LOW (ref 12.0–15.0)
MCH: 26.7 pg (ref 26.0–34.0)
MCHC: 33.8 g/dL (ref 30.0–36.0)
MCV: 79.1 fL (ref 78.0–100.0)
Platelets: 225 10*3/uL (ref 150–400)
RBC: 2.73 MIL/uL — AB (ref 3.87–5.11)
RDW: 14.1 % (ref 11.5–15.5)
WBC: 6.7 10*3/uL (ref 4.0–10.5)

## 2014-09-11 LAB — PREPARE RBC (CROSSMATCH)

## 2014-09-11 LAB — GLUCOSE, CAPILLARY
GLUCOSE-CAPILLARY: 186 mg/dL — AB (ref 65–99)
GLUCOSE-CAPILLARY: 145 mg/dL — AB (ref 65–99)
Glucose-Capillary: 163 mg/dL — ABNORMAL HIGH (ref 65–99)
Glucose-Capillary: 162 mg/dL — ABNORMAL HIGH (ref 65–99)
Glucose-Capillary: 115 mg/dL — ABNORMAL HIGH (ref 65–99)

## 2014-09-11 LAB — ABO/RH: ABO/RH(D): A POS

## 2014-09-11 MED ORDER — IOHEXOL 300 MG/ML  SOLN
100.0000 mL | Freq: Once | INTRAMUSCULAR | Status: AC | PRN
  Administered 2014-09-11: 100 mL via INTRAVENOUS

## 2014-09-11 MED ORDER — IOHEXOL 300 MG/ML  SOLN
25.0000 mL | INTRAMUSCULAR | Status: AC
  Administered 2014-09-11 (×2): 25 mL via ORAL

## 2014-09-11 MED ORDER — POTASSIUM CHLORIDE CRYS ER 20 MEQ PO TBCR
40.0000 meq | EXTENDED_RELEASE_TABLET | Freq: Once | ORAL | Status: AC
  Administered 2014-09-11: 40 meq via ORAL
  Filled 2014-09-11: qty 2

## 2014-09-11 MED ORDER — SODIUM CHLORIDE 0.9 % IJ SOLN
10.0000 mL | INTRAMUSCULAR | Status: DC | PRN
  Administered 2014-09-12 – 2014-09-15 (×2): 10 mL
  Filled 2014-09-11 (×2): qty 40

## 2014-09-11 MED ORDER — SODIUM CHLORIDE 0.9 % IV SOLN
510.0000 mg | Freq: Once | INTRAVENOUS | Status: AC
  Administered 2014-09-11: 510 mg via INTRAVENOUS
  Filled 2014-09-11: qty 17

## 2014-09-11 MED ORDER — SODIUM CHLORIDE 0.9 % IV SOLN
Freq: Once | INTRAVENOUS | Status: AC
  Administered 2014-09-11: 12:00:00 via INTRAVENOUS

## 2014-09-11 NOTE — Progress Notes (Signed)
[facility]                                                                              Patient Demographics  Ruth Douglas, is a 74 y.o. female, DOB - 12/16/1940, WYO:[id number]  Admit date - 09/05/2014   Admitting Physician Festus Aloe, MD  Outpatient Primary MD for the patient is No PCP Per Patient  LOS - 6   No chief complaint on file.       Assessment & Plan   Sepsis secondary to Candidemia -Infectious disease currently on board and managing -Patient did have fever overnight, Tmax 101.3.  Currently no leukocytosis -Repeat blood cultures 09/08/2014 show no growth to date -Continue Eraxis (patient will need 14day course) -PICC line pending  Chronic Anemia  -Baseline hemoglobin appears to be 9 -Currently, hemoglobin 7.3, will order 1 uPRBCs to be transfused -Drop likely dilutional as patient has been receiving normal saline -IV fluids discontinued -FOBT pending  -Anemia panel: Iron 11, ferritin 246, saturation ratio 7 -Will order a dose of Feraheme -Continue to monitor CBC  DM2 (diabetes mellitus, type 2) -Continue diabetic diet and sliding scale insulin while hospitalized -Once discharged would place on diabetic diet with instructions for patient to follow-up with primary care physician for further evaluation recommendations -Hemoglobin A1c on 06/19/2014 11.9  CKD (chronic kidney disease), stage 3 -Creatinine currently trending down, currently 1.27  Hyponatremia -Suspect secondary to poor oral solute intake.  -Sodium improving, currently 135  Essential hypertension -Will continue to hold amlodipine.  BP stable  Kidney stone on left side -Patient is status post left ureteroscopy and holmium laser lithotripsy and stent placement. -Urology managing, stent removed  Code Status: Full  Family Communication: None at bedside  Disposition Plan: Admitted.  Will order unit of RBC.  Would recommend continued monitoring of patient as she did spike a  fever overnight.  No change in antifungal medication.   Time Spent in minutes   30 minutes  Procedures  Left ureteroscopy  Consults   [facility] Infectious disease  DVT Prophylaxis  SCDs  Lab Results  Component Value Date   PLT 225 09/11/2014    Medications  Scheduled Meds: . sodium chloride   Intravenous Once  . anidulafungin  100 mg Intravenous Q24H  . docusate sodium  100 mg Oral BID  . insulin aspart  0-15 Units Subcutaneous TID WC  . insulin aspart  0-5 Units Subcutaneous QHS  . iohexol  25 mL Oral Q1 Hr x 2   Continuous Infusions:  PRN Meds:.acetaminophen, acetaminophen, docusate sodium, fentaNYL (SUBLIMAZE) injection, morphine injection, ondansetron, oxyCODONE-acetaminophen, zolpidem  Antibiotics    Anti-infectives    Start     Dose/Rate Route Frequency Ordered Stop   09/08/14 0800  anidulafungin (ERAXIS) 100 mg in sodium chloride 0.9 % 100 mL IVPB     100 mg over 90 Minutes Intravenous Every 24 hours 09/07/14 1136     09/07/14 1800  vancomycin (VANCOCIN) IVPB 1000 mg/200 mL premix  Status:  Discontinued     1,000 mg 200 mL/hr over 60 Minutes Intravenous Every 48 hours 09/06/14 1303 09/07/14 1136   09/07/14 1400  fluconazole (DIFLUCAN) IVPB 400 mg  Status:  Discontinued  400 mg 100 mL/hr over 120 Minutes Intravenous Every 24 hours 09/07/14 1134 09/07/14 1136   09/07/14 1215  anidulafungin (ERAXIS) 200 mg in sodium chloride 0.9 % 200 mL IVPB     200 mg over 180 Minutes Intravenous  Once 09/07/14 1136 09/07/14 1522   09/06/14 1600  vancomycin (VANCOCIN) IVPB 750 mg/150 ml premix  Status:  Discontinued     750 mg 150 mL/hr over 60 Minutes Intravenous Every 24 hours 09/05/14 1709 09/06/14 1256   09/06/14 1400  piperacillin-tazobactam (ZOSYN) IVPB 2.25 g  Status:  Discontinued     2.25 g 100 mL/hr over 30 Minutes Intravenous 3 times per day 09/06/14 0908 09/07/14 1659   09/06/14 1400  fluconazole (DIFLUCAN) IVPB 100 mg  Status:  Discontinued     100 mg 50 mL/hr  over 60 Minutes Intravenous Every 24 hours 09/06/14 1307 09/07/14 1134   09/06/14 0000  piperacillin-tazobactam (ZOSYN) IVPB 3.375 g  Status:  Discontinued     3.375 g 12.5 mL/hr over 240 Minutes Intravenous Every 8 hours 09/05/14 1709 09/06/14 0907   09/05/14 1730  vancomycin (VANCOCIN) IVPB 1000 mg/200 mL premix     1,000 mg 200 mL/hr over 60 Minutes Intravenous  Once 09/05/14 1709 09/05/14 1839   09/05/14 1730  piperacillin-tazobactam (ZOSYN) IVPB 3.375 g     3.375 g 100 mL/hr over 30 Minutes Intravenous  Once 09/05/14 1709 09/05/14 1809   09/05/14 0903  ceFAZolin (ANCEF) IVPB 2 g/50 mL premix     2 g 100 mL/hr over 30 Minutes Intravenous 30 min pre-op 09/05/14 8338 09/05/14 1120      Subjective:   Ruth Douglas seen and examined today.  Patient has no complaints today.  She denies chest pain, shortness of breath, abdominal pain, nausea or vomiting.  Cannot remember when the stent came out.  Was able to have bowel movement overnight.  Objective:   Filed Vitals:   09/10/14 1605 09/10/14 2109 09/11/14 0201 09/11/14 0518  BP: 142/52 154/58 150/56 132/49  Pulse: 74 91 91 64  Temp: 98.4 F (36.9 C) 101.3 F (38.5 C) 100.2 F (37.9 C) 98 F (36.7 C)  TempSrc: Oral Oral Oral Oral  Resp: 16 16 18 18   Height:      Weight:      SpO2: 97% 94% 98% 97%    Wt Readings from Last 3 Encounters:  09/05/14 65.5 kg (144 lb 6.4 oz)  08/26/14 64.955 kg (143 lb 3.2 oz)  08/10/14 68.947 kg (152 lb)     Intake/Output Summary (Last 24 hours) at 09/11/14 1127 Last data filed at 09/10/14 1606  Gross per 24 hour  Intake    240 ml  Output      0 ml  Net    240 ml    Exam  General: Well developed, well nourished, no distress  HEENT: NCAT,mucous membranes moist.   Cardiovascular: S1 S2 auscultated, no M/R/G, RRR  Respiratory: Clear to auscultation  Abdomen: Soft, mild suprapubic tenderness, nondistended, + bowel sounds  Extremities: warm dry without cyanosis clubbing. Trace LE  edema.   Neuro: AAOx3, nonfocal  Psych: Normal affect and demeanor, pleasant  Data Review   Micro Results Recent Results (from the past 240 hour(s))  Urine culture     Status: None   Collection Time: 09/05/14 11:33 AM  Result Value Ref Range Status   Specimen Description URINE, CATHETERIZED CYSTOCOPE  Final   Special Requests NONE  Final   Culture   Final    >=  100,000 COLONIES/mL YEAST Performed at [facility]    Report Status 09/07/2014 FINAL  Final  Culture, blood (routine x 2)     Status: None   Collection Time: 09/05/14  5:30 PM  Result Value Ref Range Status   Specimen Description BLOOD LEFT HAND  Final   Special Requests BOTTLES DRAWN AEROBIC ONLY 4CC  Final   Culture  Setup Time   Final    YEAST AEROBIC BOTTLE ONLY CRITICAL RESULT CALLED TO, READ BACK BY AND VERIFIED WITHTillman Sers RN [phone] GREEN R    Culture CANDIDA KRUSEI Performed at [facility]   Final   Report Status 09/09/2014 FINAL  Final  Culture, blood (routine x 2)     Status: None   Collection Time: 09/05/14  5:40 PM  Result Value Ref Range Status   Specimen Description BLOOD LEFT ARM  Final   Special Requests BOTTLES DRAWN AEROBIC AND ANAEROBIC 10CC  Final   Culture  Setup Time   Final    YEAST AEROBIC BOTTLE ONLY CRITICAL RESULT CALLED TO, READ BACK BY AND VERIFIED WITH: Loyal Buba AT 2325 09/06/14 BY RGREEN    Culture CANDIDA KRUSEI Performed at [facility]   Final   Report Status 09/09/2014 FINAL  Final  Culture, blood (routine x 2)     Status: None (Preliminary result)   Collection Time: 09/08/14  5:20 AM  Result Value Ref Range Status   Specimen Description BLOOD RIGHT ARM  Final   Special Requests BOTTLES DRAWN AEROBIC ONLY 0.5CC  Final   Culture   Final    NO GROWTH 2 DAYS Performed at [facility]    Report Status PENDING  Incomplete  Culture, blood (routine x 2)     Status: None (Preliminary result)   Collection Time: 09/08/14  5:25 AM  Result  Value Ref Range Status   Specimen Description BLOOD RIGHT HAND  Final   Special Requests BOTTLES DRAWN AEROBIC ONLY [place]  Final   Culture   Final    NO GROWTH 2 DAYS Performed at [facility]    Report Status PENDING  Incomplete    Radiology Reports No results found.  CBC  Recent Labs Lab 09/05/14 1740 09/06/14 0550 09/07/14 0520 09/07/14 1600 09/08/14 0525 09/10/14 0952 09/11/14 0420  WBC 10.2 19.0* 13.7*  --  12.4* 6.5 6.7  HGB 9.1* 9.3* 7.4* 7.9* 8.1* 7.6* 7.3*  HCT 28.3* 28.9* 23.5* 24.4* 25.5* 23.7* 21.6*  PLT 277 259 205  --  224 204 225  MCV 81.1 82.6 83.0  --  83.1 79.5 79.1  MCH 26.1 26.6 26.1  --  26.4 25.5* 26.7  MCHC 32.2 32.2 31.5  --  31.8 32.1 33.8  RDW 13.7 13.8 14.1  --  14.3 14.0 14.1  LYMPHSABS 0.3*  --   --   --   --  1.0  --   MONOABS 0.2  --   --   --   --  0.5  --   EOSABS 0.1  --   --   --   --  0.1  --   BASOSABS 0.0  --   --   --   --  0.0  --     Chemistries   Recent Labs Lab 09/06/14 0550 09/07/14 0520 09/08/14 0525 09/10/14 0952 09/11/14 0420  NA 135 133* 129* 133* 135  K 4.5 4.0 3.8 3.2* 3.2*  CL 102 105 103 107 106  CO2 24 22  18* 19* 24  GLUCOSE 234* 123* 104* 173* 186*  BUN 19 24* 26* 12 9  CREATININE 2.69* 2.02* 1.96* 1.33* 1.27*  CALCIUM 7.9* 6.6* 7.1* 7.4* 7.6*   ------------------------------------------------------------------------------------------------------------------ estimated creatinine clearance is 36.6 mL/min (by C-G formula based on Cr of 1.27). ------------------------------------------------------------------------------------------------------------------ No results for input(s): HGBA1C in the last 72 hours. ------------------------------------------------------------------------------------------------------------------ No results for input(s): CHOL, HDL, LDLCALC, TRIG, CHOLHDL, LDLDIRECT in the last 72  hours. ------------------------------------------------------------------------------------------------------------------ No results for input(s): TSH, T4TOTAL, T3FREE, THYROIDAB in the last 72 hours.  Invalid input(s): FREET3 ------------------------------------------------------------------------------------------------------------------  Recent Labs  09/10/14 1345 09/10/14 1350  VITAMINB12 552  --   FOLATE 21.9  --   FERRITIN 246  --   TIBC 155*  --   IRON 11*  --   RETICCTPCT  --  0.6    Coagulation profile No results for input(s): INR, PROTIME in the last 168 hours.  No results for input(s): DDIMER in the last 72 hours.  Cardiac Enzymes No results for input(s): CKMB, TROPONINI, MYOGLOBIN in the last 168 hours.  Invalid input(s): CK ------------------------------------------------------------------------------------------------------------------ Invalid input(s): POCBNP    Hooria Gasparini D.O. on 09/11/2014 at 11:27 AM  Between 7am to 7pm - Pager - [phone]  After 7pm go to [email or web address] - password [email or web address]  And look for the night coverage person covering for me after hours  [facility] Office  [phone]

## 2014-09-11 NOTE — Progress Notes (Signed)
INFECTIOUS DISEASE PROGRESS NOTE  ID: Ruth Douglas is a 74 y.o. female with  Principal Problem:   Sepsis Active Problems:   DM2 (diabetes mellitus, type 2)   CKD (chronic kidney disease)   Essential hypertension   Kidney stone on left side   Candidemia  Subjective: Stent out last pm (pt believes it is still in) No SOB, cough. Normal BM yesterday. Continues to have fever.   Abtx:  Anti-infectives    Start     Dose/Rate Route Frequency Ordered Stop   09/08/14 0800  anidulafungin (ERAXIS) 100 mg in sodium chloride 0.9 % 100 mL IVPB     100 mg over 90 Minutes Intravenous Every 24 hours 09/07/14 1136     09/07/14 1800  vancomycin (VANCOCIN) IVPB 1000 mg/200 mL premix  Status:  Discontinued     1,000 mg 200 mL/hr over 60 Minutes Intravenous Every 48 hours 09/06/14 1303 09/07/14 1136   09/07/14 1400  fluconazole (DIFLUCAN) IVPB 400 mg  Status:  Discontinued     400 mg 100 mL/hr over 120 Minutes Intravenous Every 24 hours 09/07/14 1134 09/07/14 1136   09/07/14 1215  anidulafungin (ERAXIS) 200 mg in sodium chloride 0.9 % 200 mL IVPB     200 mg over 180 Minutes Intravenous  Once 09/07/14 1136 09/07/14 1522   09/06/14 1600  vancomycin (VANCOCIN) IVPB 750 mg/150 ml premix  Status:  Discontinued     750 mg 150 mL/hr over 60 Minutes Intravenous Every 24 hours 09/05/14 1709 09/06/14 1256   09/06/14 1400  piperacillin-tazobactam (ZOSYN) IVPB 2.25 g  Status:  Discontinued     2.25 g 100 mL/hr over 30 Minutes Intravenous 3 times per day 09/06/14 0908 09/07/14 1659   09/06/14 1400  fluconazole (DIFLUCAN) IVPB 100 mg  Status:  Discontinued     100 mg 50 mL/hr over 60 Minutes Intravenous Every 24 hours 09/06/14 1307 09/07/14 1134   09/06/14 0000  piperacillin-tazobactam (ZOSYN) IVPB 3.375 g  Status:  Discontinued     3.375 g 12.5 mL/hr over 240 Minutes Intravenous Every 8 hours 09/05/14 1709 09/06/14 0907   09/05/14 1730  vancomycin (VANCOCIN) IVPB 1000 mg/200 mL premix     1,000  mg 200 mL/hr over 60 Minutes Intravenous  Once 09/05/14 1709 09/05/14 1839   09/05/14 1730  piperacillin-tazobactam (ZOSYN) IVPB 3.375 g     3.375 g 100 mL/hr over 30 Minutes Intravenous  Once 09/05/14 1709 09/05/14 1809   09/05/14 0903  ceFAZolin (ANCEF) IVPB 2 g/50 mL premix     2 g 100 mL/hr over 30 Minutes Intravenous 30 min pre-op 09/05/14 0903 09/05/14 1120      Medications:  Scheduled: . sodium chloride   Intravenous Once  . anidulafungin  100 mg Intravenous Q24H  . docusate sodium  100 mg Oral BID  . insulin aspart  0-15 Units Subcutaneous TID WC  . insulin aspart  0-5 Units Subcutaneous QHS    Objective: Vital signs in last 24 hours: Temp:  [98 F (36.7 C)-101.3 F (38.5 C)] 98 F (36.7 C) (07/14 0518) Pulse Rate:  [64-91] 64 (07/14 0518) Resp:  [16-18] 18 (07/14 0518) BP: (131-154)/(49-60) 132/49 mmHg (07/14 0518) SpO2:  [94 %-98 %] 97 % (07/14 0518)   General appearance: alert, cooperative and no distress Resp: clear to auscultation bilaterally Cardio: regular rate and rhythm GI: normal findings: bowel sounds normal, soft, non-tender and subjective tenderness suprapubic Extremities: edema trace and no cordis in LE or UE  Lab Results  Recent  Labs  09/10/14 0952 09/11/14 0420  WBC 6.5 6.7  HGB 7.6* 7.3*  HCT 23.7* 21.6*  NA 133* 135  K 3.2* 3.2*  CL 107 106  CO2 19* 24  BUN 12 9  CREATININE 1.33* 1.27*   Liver Panel No results for input(s): PROT, ALBUMIN, AST, ALT, ALKPHOS, BILITOT, BILIDIR, IBILI in the last 72 hours. Sedimentation Rate No results for input(s): ESRSEDRATE in the last 72 hours. C-Reactive Protein No results for input(s): CRP in the last 72 hours.  Microbiology: Recent Results (from the past 240 hour(s))  Urine culture     Status: None   Collection Time: 09/05/14 11:33 AM  Result Value Ref Range Status   Specimen Description URINE, CATHETERIZED CYSTOCOPE  Final   Special Requests NONE  Final   Culture   Final    >=100,000  COLONIES/mL YEAST Performed at [facility]    Report Status 09/07/2014 FINAL  Final  Culture, blood (routine x 2)     Status: None   Collection Time: 09/05/14  5:30 PM  Result Value Ref Range Status   Specimen Description BLOOD LEFT HAND  Final   Special Requests BOTTLES DRAWN AEROBIC ONLY 4CC  Final   Culture  Setup Time   Final    YEAST AEROBIC BOTTLE ONLY CRITICAL RESULT CALLED TO, READ BACK BY AND VERIFIED WITHTillman Sers RN [phone] GREEN R    Culture CANDIDA KRUSEI Performed at [facility]   Final   Report Status 09/09/2014 FINAL  Final  Culture, blood (routine x 2)     Status: None   Collection Time: 09/05/14  5:40 PM  Result Value Ref Range Status   Specimen Description BLOOD LEFT ARM  Final   Special Requests BOTTLES DRAWN AEROBIC AND ANAEROBIC 10CC  Final   Culture  Setup Time   Final    YEAST AEROBIC BOTTLE ONLY CRITICAL RESULT CALLED TO, READ BACK BY AND VERIFIED WITH: Loyal Buba AT 2325 09/06/14 BY RGREEN    Culture CANDIDA KRUSEI Performed at [facility]   Final   Report Status 09/09/2014 FINAL  Final  Culture, blood (routine x 2)     Status: None (Preliminary result)   Collection Time: 09/08/14  5:20 AM  Result Value Ref Range Status   Specimen Description BLOOD RIGHT ARM  Final   Special Requests BOTTLES DRAWN AEROBIC ONLY 0.5CC  Final   Culture   Final    NO GROWTH 2 DAYS Performed at [facility]    Report Status PENDING  Incomplete  Culture, blood (routine x 2)     Status: None (Preliminary result)   Collection Time: 09/08/14  5:25 AM  Result Value Ref Range Status   Specimen Description BLOOD RIGHT HAND  Final   Special Requests BOTTLES DRAWN AEROBIC ONLY [place]  Final   Culture   Final    NO GROWTH 2 DAYS Performed at [facility]    Report Status PENDING  Incomplete    Studies/Results: No results found.   Assessment/Plan: Candidemia- krusei Nephrolithiasis Ureteral stent- out  7-13 HypoKalemia Anemia  Total days of antibiotics: 5 anidulafungin  Continues to have fever Will check CT abd/pelvis- spoke with CT about contrast, they will assess her risk No change in anti-fungal PIC today         Bobby Rumpf Infectious Diseases (pager) [phone] [email or web address] 09/11/2014, 10:16 AM  LOS: 6 days

## 2014-09-11 NOTE — Progress Notes (Signed)
Patient ID: Ruth Douglas, female   DOB: [birth year], 74 y.o.   MRN: [id number]   I reviewed CT images and discussed with Dr. Clovis Riley, Dr. Laurence Ferrari and Dr. Johnnye Sima. Based on imaging the fluid appears to be hematoma that is organizing and less likely abscess. The patients normal white count and drop in H/H support the diagnosis of hematoma. Placing a drain may introduce bacteria into a sterile environment. Infection would be difficult to clear if introduced. Pt AFVSS today, therefore will continue observation. The kidney is draining well without hydronephrosis.

## 2014-09-11 NOTE — Progress Notes (Signed)
PT Cancellation Note  Patient Details Name: Ruth Douglas MRN: [id number] DOB: Oct 20, 1940   Cancelled Treatment:    Reason Eval/Treat Not Completed: Patient at procedure or test/unavailable   Weston Anna, MPT Pager: [phone]

## 2014-09-11 NOTE — Progress Notes (Addendum)
Patient ID: Ruth Douglas, female   DOB: March 06, 1940, 74 y.o.   MRN: [id number]   Patient with fever yesterday evening.  Stent pulled out last night which is fine.  It was sitting on the counter intact.  I showed it to the patient. She is having no flank pain.   Has some urgency and urge incontinence.  Voiding with a good flow and no dysuria.  No flank pain.  Appreciate hospitalist and ID management.  I discussed the patient with Dr. Johnnye Sima.  CT A/P pending. PICC today. On Eraxis. Discussed with patient concern for abscess and possible need for perc. Drain.

## 2014-09-11 NOTE — Progress Notes (Signed)
Peripherally Inserted Central Catheter/Midline Placement  The IV Nurse has discussed with the patient and/or persons authorized to consent for the patient, the purpose of this procedure and the potential benefits and risks involved with this procedure.  The benefits include less needle sticks, lab draws from the catheter and patient may be discharged home with the catheter.  Risks include, but not limited to, infection, bleeding, blood clot (thrombus formation), and puncture of an artery; nerve damage and irregular heat beat.  Alternatives to this procedure were also discussed.  PICC/Midline Placement Documentation        Ruth Douglas 09/11/2014, 2:44 PM Consent obtained by Claretha Cooper, RN

## 2014-09-12 LAB — TYPE AND SCREEN
ABO/RH(D): A POS
ANTIBODY SCREEN: NEGATIVE
UNIT DIVISION: 0

## 2014-09-12 LAB — BASIC METABOLIC PANEL
ANION GAP: 5 (ref 5–15)
BUN: 8 mg/dL (ref 6–20)
CALCIUM: 7.7 mg/dL — AB (ref 8.9–10.3)
CO2: 25 mmol/L (ref 22–32)
Chloride: 104 mmol/L (ref 101–111)
Creatinine, Ser: 1.27 mg/dL — ABNORMAL HIGH (ref 0.44–1.00)
GFR calc Af Amer: 48 mL/min — ABNORMAL LOW (ref >60)
GFR, EST NON AFRICAN AMERICAN: 41 mL/min — AB (ref >60)
GLUCOSE: 153 mg/dL — AB (ref 65–99)
Potassium: 3 mmol/L — ABNORMAL LOW (ref 3.5–5.1)
Sodium: 134 mmol/L — ABNORMAL LOW (ref 135–145)

## 2014-09-12 LAB — GLUCOSE, CAPILLARY
GLUCOSE-CAPILLARY: 110 mg/dL — AB (ref 65–99)
Glucose-Capillary: 156 mg/dL — ABNORMAL HIGH (ref 65–99)
Glucose-Capillary: 128 mg/dL — ABNORMAL HIGH (ref 65–99)
Glucose-Capillary: 125 mg/dL — ABNORMAL HIGH (ref 65–99)

## 2014-09-12 LAB — CBC
HCT: 26.2 % — ABNORMAL LOW (ref 36.0–46.0)
Hemoglobin: 8.8 g/dL — ABNORMAL LOW (ref 12.0–15.0)
MCH: 27.6 pg (ref 26.0–34.0)
MCHC: 33.6 g/dL (ref 30.0–36.0)
MCV: 82.1 fL (ref 78.0–100.0)
PLATELETS: 307 10*3/uL (ref 150–400)
RBC: 3.19 MIL/uL — ABNORMAL LOW (ref 3.87–5.11)
RDW: 14.6 % (ref 11.5–15.5)
WBC: 7.4 10*3/uL (ref 4.0–10.5)

## 2014-09-12 LAB — MAGNESIUM: Magnesium: 1.5 mg/dL — ABNORMAL LOW (ref 1.7–2.4)

## 2014-09-12 MED ORDER — MAGNESIUM SULFATE 2 GM/50ML IV SOLN
2.0000 g | Freq: Once | INTRAVENOUS | Status: AC
  Administered 2014-09-12: 2 g via INTRAVENOUS
  Filled 2014-09-12: qty 50

## 2014-09-12 MED ORDER — POTASSIUM CHLORIDE CRYS ER 20 MEQ PO TBCR
40.0000 meq | EXTENDED_RELEASE_TABLET | Freq: Two times a day (BID) | ORAL | Status: DC
  Administered 2014-09-12 – 2014-09-15 (×7): 40 meq via ORAL
  Filled 2014-09-12 (×7): qty 2

## 2014-09-12 MED ORDER — NITROFURANTOIN MONOHYD MACRO 100 MG PO CAPS: 100.0000 mg | ORAL_CAPSULE | Freq: Every day | ORAL | Status: DC

## 2014-09-12 MED ORDER — SODIUM CHLORIDE 0.9 % IV SOLN: 100.0000 mg | INTRAVENOUS | Status: DC

## 2014-09-12 MED ORDER — OXYCODONE-ACETAMINOPHEN 5-325 MG PO TABS: 1.0000 | ORAL_TABLET | Freq: Four times a day (QID) | ORAL | Status: DC | PRN

## 2014-09-12 NOTE — Discharge Summary (Addendum)
Physician Discharge Summary  Patient ID: Ruth Douglas MRN: [id number] DOB/AGE: Jan 09, 1941 74 y.o.  Admit date: 09/05/2014 Discharge date: 09/12/2014  Admission Diagnoses: Left ureteral stone   Discharge Diagnoses:  Principal Problem:   Sepsis Active Problems:   DM2 (diabetes mellitus, type 2)   CKD (chronic kidney disease)   Essential hypertension   Kidney stone on left side   Candidemia   Fever Left ureteral stone Pelvic organ prolapse  Left perinephric hematoma   Discharged Condition: good  Hospital Course: Patient was admitted following left ureteroscopy holmium laser lithotripsy, stone basket extraction and stent placement with straining due to sepsis.  Blood and urine cultures grew yeast.  Broad-spectrum antibiotics were discontinued after ID saw the patient and transitioned her from fluconazole to our Eraxis.   Repeat blood cultures were negative and she will need to complete a 14 day course of Eraxis - through Sep 20, 2015 (last dose September 20, 2015). A PICC line was placed in the right arm.  Her left ureteral stent was removed with the string on postop day #5.  Patient's fever improved but 48 hours prior to discharge she had a low-grade temperature therefore CT of the abdomen and pelvis was obtained.  She had a small left renal hematoma based on imaging.  She has some baseline anemia and her H&H dropped somewhat.  She was given 1 unit of blood.  She remained afebrile with a normal white count and was discharged to home.   Consults: hospitalist and infectious disease  Significant Diagnostic Studies: radiology: CT scan: left perinephric hematoma   Treatments: antifugals: Eraxis; PICC line - right arm    Discharge Exam: Blood pressure 155/57, pulse 82, temperature 98.4 F (36.9 C), temperature source Oral, resp. rate 18, height 5\' 5"  (1.651 m), weight 65.5 kg (144 lb 6.4 oz), SpO2 94 %. NAD Resting in bed CV - RRR Lungs - regular effort and depth Abd - soft,  NT Ext - no edema or calf pain or swelling; Right PICC in place.   Disposition: 01-Home or Self Care with return to [facility] for daily infusion.   Discharge Instructions    Discharge patient    Complete by:  As directed             Medication List    STOP taking these medications        cephALEXin 500 MG capsule  Commonly known as:  KEFLEX      TAKE these medications        acetaminophen 500 MG tablet  Commonly known as:  TYLENOL  Take 1,000 mg by mouth every 6 (six) hours as needed for mild pain (pain).     amLODipine 5 MG tablet  Commonly known as:  NORVASC  Take 5 mg by mouth every morning.     anidulafungin 100 mg in sodium chloride 0.9 % 100 mL  Inject 100 mg into the vein daily.     CRANBERRY PO  Take 1 capsule by mouth every morning.     docusate sodium 100 MG capsule  Commonly known as:  COLACE  Take 1 capsule (100 mg total) by mouth daily as needed for mild constipation.     ferrous sulfate 325 (65 FE) MG tablet  Take 1 tablet (325 mg total) by mouth 2 (two) times daily with a meal.     insulin glargine 100 UNIT/ML injection  Commonly known as:  LANTUS  Inject 0.15 mLs (15 Units total) into the skin at bedtime.  nitrofurantoin (macrocrystal-monohydrate) 100 MG capsule  Commonly known as:  MACROBID  Take 1 capsule (100 mg total) by mouth at bedtime.     oxyCODONE-acetaminophen 5-325 MG per tablet  Commonly known as:  PERCOCET  Take 1 tablet by mouth every 6 (six) hours as needed for moderate pain.           Follow-up Information    Follow up with Taahir Grisby, MD In 5 weeks.   Specialty:  Urology   Contact information:   [place] [address] [phone]       Follow up with Bobby Rumpf, MD In 10 days.   Specialty:  Infectious Diseases   Contact information:   [address] [address] [phone]       Signed: Festus Aloe 09/12/2014, 9:33 AM

## 2014-09-12 NOTE — Progress Notes (Addendum)
Explained to pt that no [place] agency would take her insurance for [facility] IV infusion. Another Option given to pt for OP infusion, in [place], [place]. A 45 mins drive for the pt every day. Information was faxed. Pt wants to relay this information to her son [name].   Spoke with Ashley concerning SNF. Pt has [employer] Medicare, no PT follow up recommendation from PT. Pt would not qualify for SNF.   Spoke with pt's son [name] [phone],  Explained to him there are no [facility] agencies available to his mother(Pt),  for [facility] IV infusion. [name] states he is willing to take pt to [facility]st choice for IV infusion, 2nd choice [facility].  Will cancel appointment for PCP at [facility] it is too far from the pt's home/son [name]. [name] states that he will find pt a PCP.   Spoke with Dr. Junious Silk to explain these barriers. Pt will not discharge over weekend related to;not being able to receive IV ABX at home.

## 2014-09-12 NOTE — Discharge Instructions (Signed)
Please establish with a primary care provider or go to an urgent care or your usual medical provider for a CBC and a BMP in 7 days.  Prescription given.  Diabetes Mellitus and Food It is important for you to manage your blood sugar (glucose) level. Your blood glucose level can be greatly affected by what you eat. Eating healthier foods in the appropriate amounts throughout the day at about the same time each day will help you control your blood glucose level. It can also help slow or prevent worsening of your diabetes mellitus. Healthy eating may even help you improve the level of your blood pressure and reach or maintain a healthy weight.   HOW CAN FOOD AFFECT ME? Carbohydrates Carbohydrates affect your blood glucose level more than any other type of food. Your dietitian will help you determine how many carbohydrates to eat at each meal and teach you how to count carbohydrates. Counting carbohydrates is important to keep your blood glucose at a healthy level, especially if you are using insulin or taking certain medicines for diabetes mellitus. Alcohol Alcohol can cause sudden decreases in blood glucose (hypoglycemia), especially if you use insulin or take certain medicines for diabetes mellitus. Hypoglycemia can be a life-threatening condition. Symptoms of hypoglycemia (sleepiness, dizziness, and disorientation) are similar to symptoms of having too much alcohol.  If your health care provider has given you approval to drink alcohol, do so in moderation and use the following guidelines:  Women should not have more than one drink per day, and men should not have more than two drinks per day. One drink is equal to:  12 oz of beer.  5 oz of wine.  1 oz of hard liquor.  Do not drink on an empty stomach.  Keep yourself hydrated. Have water, diet soda, or unsweetened iced tea.  Regular soda, juice, and other mixers might contain a lot of carbohydrates and should be counted. WHAT FOODS ARE NOT  RECOMMENDED? As you make food choices, it is important to remember that all foods are not the same. Some foods have fewer nutrients per serving than other foods, even though they might have the same number of calories or carbohydrates. It is difficult to get your body what it needs when you eat foods with fewer nutrients. Examples of foods that you should avoid that are high in calories and carbohydrates but low in nutrients include:  Trans fats (most processed foods list trans fats on the Nutrition Facts label).  Regular soda.  Juice.  Candy.  Sweets, such as cake, pie, doughnuts, and cookies.  Fried foods. WHAT FOODS CAN I EAT? Have nutrient-rich foods, which will nourish your body and keep you healthy. The food you should eat also will depend on several factors, including:  The calories you need.  The medicines you take.  Your weight.  Your blood glucose level.  Your blood pressure level.  Your cholesterol level. You also should eat a variety of foods, including:  Protein, such as meat, poultry, fish, tofu, nuts, and seeds (lean animal proteins are best).  Fruits.  Vegetables.  Dairy products, such as milk, cheese, and yogurt (low fat is best).  Breads, grains, pasta, cereal, rice, and beans.  Fats such as olive oil, trans fat-free margarine, canola oil, avocado, and olives. DOES EVERYONE WITH DIABETES MELLITUS HAVE THE SAME MEAL PLAN? Because every person with diabetes mellitus is different, there is not one meal plan that works for everyone. It is very important that you meet with  a dietitian who will help you create a meal plan that is just right for you. Document Released: 11/11/2004 Document Revised: 02/19/2013 Document Reviewed: 01/11/2013 [facility] Patient Information 2015 [place], [place]. This information is not intended to replace advice given to you by your health care provider. Make sure you discuss any questions you have with your health care  provider.  PICC Home Guide A peripherally inserted central catheter (PICC) is a long, thin, flexible tube that is inserted into a vein in the upper arm. It is a form of intravenous (IV) access. It is considered to be a "central" line because the tip of the PICC ends in a large vein in your chest. This large vein is called the superior vena cava (SVC). The PICC tip ends in the SVC because there is a lot of blood flow in the SVC. This allows medicines and IV fluids to be quickly distributed throughout the body. The PICC is inserted using a sterile technique by a specially trained nurse or physician. After the PICC is inserted, a chest X-ray exam is done to be sure it is in the correct place.  A PICC may be placed for different reasons, such as:  To give medicines and liquid nutrition that can only be given through a central line. Examples are:  Certain antibiotic treatments.  Chemotherapy.  Total parenteral nutrition (TPN).  To take frequent blood samples.  To give IV fluids and blood products.  If there is difficulty placing a peripheral intravenous (PIV) catheter. If taken care of properly, a PICC can remain in place for several months. A PICC can also allow a person to go home from the hospital early. Medicine and PICC care can be managed at home by a family member or home health care team. [place] A PICC? Problems with a PICC can occasionally occur. These may include the following:  A blood clot (thrombus) forming in or at the tip of the PICC. This can cause the PICC to become clogged. A clot-dissolving medicine called tissue plasminogen activator (tPA) can be given through the PICC to help break up the clot.  Inflammation of the vein (phlebitis) in which the PICC is placed. Signs of inflammation may include redness, pain at the insertion site, red streaks, or being able to feel a "cord" in the vein where the PICC is located.  Infection in the PICC or at the  insertion site. Signs of infection may include fever, chills, redness, swelling, or pus drainage from the PICC insertion site.  PICC movement (malposition). The PICC tip may move from its original position due to excessive physical activity, forceful coughing, sneezing, or vomiting.  A break or cut in the PICC. It is important to not use scissors near the PICC.  Nerve or tendon irritation or injury during PICC insertion. WHAT SHOULD I KEEP IN MIND ABOUT ACTIVITIES WHEN I HAVE A PICC?  You may bend your arm and move it freely. If your PICC is near or at the bend of your elbow, avoid activity with repeated motion at the elbow.  Rest at home for the remainder of the day following PICC line insertion.  Avoid lifting heavy objects as instructed by your health care provider.  Avoid using a crutch with the arm on the same side as your PICC. You may need to use a walker. WHAT SHOULD I KNOW ABOUT MY PICC DRESSING?  Keep your PICC bandage (dressing) clean and dry to prevent infection.  Ask your  health care provider when you may shower. Ask your health care provider to teach you how to wrap the PICC when you do take a shower.  Change the PICC dressing as instructed by your health care provider.  Change your PICC dressing if it becomes loose or wet. WHAT SHOULD I KNOW ABOUT PICC CARE?  Check the PICC insertion site daily for leakage, redness, swelling, or pain.  Do not take a bath, swim, or use hot tubs when you have a PICC. Cover PICC line with clear plastic wrap and tape to keep it dry while showering.  Flush the PICC as directed by your health care provider. Let your health care provider know right away if the PICC is difficult to flush or does not flush. Do not use force to flush the PICC.  Do not use a syringe that is less than 10 mL to flush the PICC.  Never pull or tug on the PICC.  Avoid blood pressure checks on the arm with the PICC.  Keep your PICC identification card with you at  all times.  Do not take the PICC out yourself. Only a trained clinical professional should remove the PICC. SEEK IMMEDIATE MEDICAL CARE IF:  Your PICC is accidentally pulled all the way out. If this happens, cover the insertion site with a bandage or gauze dressing. Do not throw the PICC away. Your health care provider will need to inspect it.  Your PICC was tugged or pulled and has partially come out. Do not  push the PICC back in.  There is any type of drainage, redness, or swelling where the PICC enters the skin.  You cannot flush the PICC, it is difficult to flush, or the PICC leaks around the insertion site when it is flushed.  You hear a "flushing" sound when the PICC is flushed.  You have pain, discomfort, or numbness in your arm, shoulder, or jaw on the same side as the PICC.  You feel your heart "racing" or skipping beats.  You notice a hole or tear in the PICC.  You develop chills or a fever. MAKE SURE YOU:   Understand these instructions.  Will watch your condition.  Will get help right away if you are not doing well or get worse. Document Released: 08/21/2002 Document Revised: 07/01/2013 Document Reviewed: 10/22/2012 [facility] Patient Information 2015 [place], [place]. This information is not intended to replace advice given to you by your health care provider. Make sure you discuss any questions you have with your health care provider.

## 2014-09-12 NOTE — Care Management Note (Signed)
Case Management Note  Patient Details  Name: Ruth Douglas MRN: [id number] Date of Birth: 05-22-1940  Subjective/Objective:                    Action/Plan:   Expected Discharge Date:                  Expected Discharge Plan:  [place]  In-House Referral:     Discharge planning Services  CM Consult, Follow-up appt scheduled  Post Acute Care Choice:    Choice offered to:     DME Arranged:    DME Agency:     HH Arranged:    HH Agency:     Status of Service:  In process, will continue to follow  Medicare Important Message Given:  Yes-second notification given Date Medicare IM Given:    Medicare IM give by:    Date Additional Medicare IM Given:    Additional Medicare Important Message give by:     If discussed at [place] of Stay Meetings, dates discussed:    Additional Comments:follow up appointment with Dr. Hampton Abbot on July 25th at 1:00 PM, [facility] [phone].  Purcell Mouton, RN 09/12/2014, 2:50 PM

## 2014-09-12 NOTE — Progress Notes (Signed)
Unable to find a Home agency in [place] for IV abx infusion. No staff available and insurance was not accepted. A call to Dr. Lyndal Rainbow office to inform him of this information was made.

## 2014-09-12 NOTE — Progress Notes (Signed)
[facility]  Call from Dawayne Patricia, RN Case Manager as pt chose [facility] as home care agency and for her  home infusion pharmacy.  Upon investigation referral, it was discovered pt lived in [place].  [facility] infusion pharmacy is able to support the IV ABX in [place], however, [facility] would need to work with a contracted agency for pt in [place].  Calls placed to 6 agencies and all declined the referral.  Only 2 agencies in [place] will take [facility] patients.  Call to [place] and they may be able to support pt as an outpt, but pt will need to come to center each day.  Maudie Mercury is contact the center. Cookie to fax all to [employer].  Per Maudie Mercury, likely they will not be able to verify insurance coverage until Monday a.m.   If patient discharges after hours, please call [phone].   Larry Sierras 09/12/2014, 5:19 PM

## 2014-09-12 NOTE — Progress Notes (Signed)
After talking with pt and calling various PCP's offices with no results. Pt asked that [place], [place], [place], [place], [place], [place] be called. Search and a call to [facility] [phone], with no PCP appointment.  A list of PCP's given to pt to call for a PCP appointment.

## 2014-09-12 NOTE — Progress Notes (Addendum)
[facility]                                                                              Patient Demographics  Ruth Douglas, is a 74 y.o. female, DOB - 25-Jul-1940, WLN:[id number]  Admit date - 09/05/2014   Admitting Physician Festus Aloe, MD  Outpatient Primary MD for the patient is No PCP Per Patient  LOS - 7   No chief complaint on file.      Assessment & Plan   Sepsis secondary to Candidemia -Infectious disease currently on board and managing -Patient has been afebrile for the past 24hours. Currently no leukocytosis -Repeat blood cultures 09/08/2014 show no growth to date -Continue Eraxis (patient will need 14day course) -PICC line placed -Patient will need to follow up with Dr. Johnnye Sima in 10 days.   Chronic Anemia vs Acute blood loss Anemia from possible hematoma -Baseline hemoglobin appears to be 9 -Currently, hemoglobin 8.8 (after 1uPRBCs) -Drop likely dilutional as patient has been receiving normal saline -IV fluids discontinued -FOBT pending  -Anemia panel: Iron 11, ferritin 246, saturation ratio 7 -Dose of Feraheme given  DM2 (diabetes mellitus, type 2) -Continue diabetic diet and sliding scale insulin while hospitalized -Once discharged would place on diabetic diet with instructions for patient to follow-up with primary care physician for further evaluation recommendations -Hemoglobin A1c on 06/19/2014 11.9  CKD (chronic kidney disease), stage 3 -Creatinine currently trending down, currently 1.27  Hyponatremia -Suspect secondary to poor oral solute intake.  -Improving  Essential hypertension -Amlodipine initially held, but may resume at discharge  Kidney stone on left side -Patient is status post left ureteroscopy and holmium laser lithotripsy and stent placement. -Urology managing, stent removed -CT abd/pelvis 09/11/2014: Left sided subscapsular fluid collection, favor hematoma  Hypokalemia and hypomagnesemia -K 3.0, Mag 1.5 -Both  replaced, would recommended repeat BMP and Magnesium level in 1 week.   Code Status: Full  Family Communication: None at bedside  Disposition Plan: Admitted.  Hemoglobin stable.  Would recommend repeat CBC and BMP in one week if possible.  Would also recommend patient establish care with a primary care physician.    Time Spent in minutes   30 minutes  Procedures  Left ureteroscopy  Consults   [facility] Infectious disease  DVT Prophylaxis  SCDs  Lab Results  Component Value Date   PLT 307 09/12/2014    Medications  Scheduled Meds: . anidulafungin  100 mg Intravenous Q24H  . docusate sodium  100 mg Oral BID  . insulin aspart  0-15 Units Subcutaneous TID WC  . insulin aspart  0-5 Units Subcutaneous QHS  . magnesium sulfate 1 - 4 g bolus IVPB  2 g Intravenous Once  . potassium chloride  40 mEq Oral BID   Continuous Infusions:  PRN Meds:.acetaminophen, acetaminophen, docusate sodium, fentaNYL (SUBLIMAZE) injection, morphine injection, ondansetron, oxyCODONE-acetaminophen, sodium chloride, zolpidem  Antibiotics    Anti-infectives    Start     Dose/Rate Route Frequency Ordered Stop   09/12/14 0000  anidulafungin 100 mg in sodium chloride 0.9 % 100 mL     100 mg over 90 Minutes Intravenous Every 24 hours 09/12/14 0929     09/08/14 0800  anidulafungin (ERAXIS)  100 mg in sodium chloride 0.9 % 100 mL IVPB     100 mg over 90 Minutes Intravenous Every 24 hours 09/07/14 1136     09/07/14 1800  vancomycin (VANCOCIN) IVPB 1000 mg/200 mL premix  Status:  Discontinued     1,000 mg 200 mL/hr over 60 Minutes Intravenous Every 48 hours 09/06/14 1303 09/07/14 1136   09/07/14 1400  fluconazole (DIFLUCAN) IVPB 400 mg  Status:  Discontinued     400 mg 100 mL/hr over 120 Minutes Intravenous Every 24 hours 09/07/14 1134 09/07/14 1136   09/07/14 1215  anidulafungin (ERAXIS) 200 mg in sodium chloride 0.9 % 200 mL IVPB     200 mg over 180 Minutes Intravenous  Once 09/07/14 1136 09/07/14 1522    09/06/14 1600  vancomycin (VANCOCIN) IVPB 750 mg/150 ml premix  Status:  Discontinued     750 mg 150 mL/hr over 60 Minutes Intravenous Every 24 hours 09/05/14 1709 09/06/14 1256   09/06/14 1400  piperacillin-tazobactam (ZOSYN) IVPB 2.25 g  Status:  Discontinued     2.25 g 100 mL/hr over 30 Minutes Intravenous 3 times per day 09/06/14 0908 09/07/14 1659   09/06/14 1400  fluconazole (DIFLUCAN) IVPB 100 mg  Status:  Discontinued     100 mg 50 mL/hr over 60 Minutes Intravenous Every 24 hours 09/06/14 1307 09/07/14 1134   09/06/14 0000  piperacillin-tazobactam (ZOSYN) IVPB 3.375 g  Status:  Discontinued     3.375 g 12.5 mL/hr over 240 Minutes Intravenous Every 8 hours 09/05/14 1709 09/06/14 0907   09/05/14 1730  vancomycin (VANCOCIN) IVPB 1000 mg/200 mL premix     1,000 mg 200 mL/hr over 60 Minutes Intravenous  Once 09/05/14 1709 09/05/14 1839   09/05/14 1730  piperacillin-tazobactam (ZOSYN) IVPB 3.375 g     3.375 g 100 mL/hr over 30 Minutes Intravenous  Once 09/05/14 1709 09/05/14 1809   09/05/14 0903  ceFAZolin (ANCEF) IVPB 2 g/50 mL premix     2 g 100 mL/hr over 30 Minutes Intravenous 30 min pre-op 09/05/14 2119 09/05/14 1120      Subjective:   Ruth Douglas seen and examined today.  Patient has no complaints today and is anxious to go home.  Denies chest pain, shortness of breath, abdominal pain, nausea or vomiting.  Objective:   Filed Vitals:   09/11/14 1215 09/11/14 1430 09/11/14 2138 09/12/14 0615  BP: 144/56 152/58 170/60 155/57  Pulse: 62 78 83 82  Temp: 98.2 F (36.8 C) 98.6 F (37 C) 98.6 F (37 C) 98.4 F (36.9 C)  TempSrc: Oral Oral Oral Oral  Resp: 18 18 22 18   Height:      Weight:      SpO2: 98% 94% 97% 94%    Wt Readings from Last 3 Encounters:  09/05/14 65.5 kg (144 lb 6.4 oz)  08/26/14 64.955 kg (143 lb 3.2 oz)  08/10/14 68.947 kg (152 lb)     Intake/Output Summary (Last 24 hours) at 09/12/14 1043 Last data filed at 09/11/14 1800  Gross per 24  hour  Intake    575 ml  Output    300 ml  Net    275 ml    Exam  General: Well developed, well nourished, no distress  HEENT: NCAT,mucous membranes moist.   Cardiovascular: S1 S2 auscultated, no M/R/G, RRR  Respiratory: Clear to auscultation  Abdomen: Soft, mild suprapubic tenderness, nondistended, + bowel sounds  Extremities: warm dry without cyanosis clubbing. Trace LE edema.   Neuro: AAOx3, nonfocal  Psych: Normal affect and demeanor, pleasant  Data Review   Micro Results Recent Results (from the past 240 hour(s))  Urine culture     Status: None   Collection Time: 09/05/14 11:33 AM  Result Value Ref Range Status   Specimen Description URINE, CATHETERIZED CYSTOCOPE  Final   Special Requests NONE  Final   Culture   Final    >=100,000 COLONIES/mL YEAST Performed at [facility]    Report Status 09/07/2014 FINAL  Final  Culture, blood (routine x 2)     Status: None   Collection Time: 09/05/14  5:30 PM  Result Value Ref Range Status   Specimen Description BLOOD LEFT HAND  Final   Special Requests BOTTLES DRAWN AEROBIC ONLY 4CC  Final   Culture  Setup Time   Final    YEAST AEROBIC BOTTLE ONLY CRITICAL RESULT CALLED TO, READ BACK BY AND VERIFIED WITHTillman Sers RN [phone] 2325 GREEN R    Culture CANDIDA KRUSEI Performed at [facility]   Final   Report Status 09/09/2014 FINAL  Final  Culture, blood (routine x 2)     Status: None   Collection Time: 09/05/14  5:40 PM  Result Value Ref Range Status   Specimen Description BLOOD LEFT ARM  Final   Special Requests BOTTLES DRAWN AEROBIC AND ANAEROBIC 10CC  Final   Culture  Setup Time   Final    YEAST AEROBIC BOTTLE ONLY CRITICAL RESULT CALLED TO, READ BACK BY AND VERIFIED WITH: Ruth Douglas AT 2325 09/06/14 BY RGREEN    Culture CANDIDA KRUSEI Performed at [facility]   Final   Report Status 09/09/2014 FINAL  Final  Culture, blood (routine x 2)     Status: None (Preliminary result)   Collection  Time: 09/08/14  5:20 AM  Result Value Ref Range Status   Specimen Description BLOOD RIGHT ARM  Final   Special Requests BOTTLES DRAWN AEROBIC ONLY 0.5CC  Final   Culture   Final    NO GROWTH 3 DAYS Performed at [facility]    Report Status PENDING  Incomplete  Culture, blood (routine x 2)     Status: None (Preliminary result)   Collection Time: 09/08/14  5:25 AM  Result Value Ref Range Status   Specimen Description BLOOD RIGHT HAND  Final   Special Requests BOTTLES DRAWN AEROBIC ONLY Ruth Douglas  Final   Culture   Final    NO GROWTH 3 DAYS Performed at [facility]    Report Status PENDING  Incomplete    Radiology Reports Ct Abdomen Pelvis W Contrast  09/11/2014   CLINICAL DATA:  Fever of unknown origin  EXAM: CT ABDOMEN AND PELVIS WITH CONTRAST  TECHNIQUE: Multidetector CT imaging of the abdomen and pelvis was performed using the standard protocol following bolus administration of intravenous contrast.  CONTRAST:  130mL OMNIPAQUE IOHEXOL 300 MG/ML  SOLN  COMPARISON:  08/08/2014  FINDINGS: Lower chest: There are small bilateral pleural effusions. Overlying compressive type atelectasis is identified.  Hepatobiliary: No suspicious liver abnormality. The gallbladder appears normal. There is no biliary dilatation.  Pancreas: Normal appearance of the pancreas.  Spleen: The spleen is unremarkable.  Adrenals/Urinary Tract: The adrenal glands are both normal. Two cysts are noted within the upper pole of the right kidney with attenuation and enhancement characteristics compatible with Bosniak 1 type lesions. Subcapsular hematoma is identified involving the left kidney. This measures 6.8 x 3.0 x 8.2 cm. There is no gas within the subcapsular fluid  collection. 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 Left-sided pelvocaliectasis noted. There is asymmetric enhancement of the mildly dilated left ureter. No ureteral calculi identified. Bladder wall thickening  is again noted. No focal filling defects within the bladder noted. On the delayed images there is symmetric excretion of contrast material from both kidneys. The entire left ureter is opacified and appears patent.  Stomach/Bowel: The stomach is within normal limits. The small bowel loops have a normal course and caliber. No obstruction. Normal appearance of the colon. The appendix is visualized and appears normal.  Vascular/Lymphatic: Calcified atherosclerotic disease involves the abdominal aorta. No aneurysm. No enlarged retroperitoneal or mesenteric adenopathy. No enlarged pelvic or inguinal lymph nodes.  Reproductive: The uterus and adnexal structures are unremarkable.  Other: There is a trace amount of free fluid within the dependent portion of the pelvis.  Musculoskeletal: The visualized bony structures are on unremarkable.  IMPRESSION: 1. Left-sided subcapsular fluid collection. Favor hematoma status post lithotripsy. In the setting of fever infected hematoma cannot be excluded. 2. Diffuse bladder wall thickening identified compatible with cystitis. 3. Right renal cyst. 4. Aortic atherosclerosis.   Electronically Signed   By: Kerby Moors M.D.   On: 09/11/2014 15:57    CBC  Recent Labs Lab 09/05/14 1740  09/07/14 0520 09/07/14 1600 09/08/14 0525 09/10/14 0952 09/11/14 0420 09/12/14 0455  WBC 10.2  < > 13.7*  --  12.4* 6.5 6.7 7.4  HGB 9.1*  < > 7.4* 7.9* 8.1* 7.6* 7.3* 8.8*  HCT 28.3*  < > 23.5* 24.4* 25.5* 23.7* 21.6* 26.2*  PLT 277  < > 205  --  224 204 225 307  MCV 81.1  < > 83.0  --  83.1 79.5 79.1 82.1  MCH 26.1  < > 26.1  --  26.4 25.5* 26.7 27.6  MCHC 32.2  < > 31.5  --  31.8 32.1 33.8 33.6  RDW 13.7  < > 14.1  --  14.3 14.0 14.1 14.6  LYMPHSABS 0.3*  --   --   --   --  1.0  --   --   MONOABS 0.2  --   --   --   --  0.5  --   --   EOSABS 0.1  --   --   --   --  0.1  --   --   BASOSABS 0.0  --   --   --   --  0.0  --   --   < > = values in this interval not  displayed.  Chemistries   Recent Labs Lab 09/07/14 0520 09/08/14 0525 09/10/14 0952 09/11/14 0420 09/12/14 0455  NA 133* 129* 133* 135 134*  K 4.0 3.8 3.2* 3.2* 3.0*  CL 105 103 107 106 104  CO2 22 18* 19* 24 25  GLUCOSE 123* 104* 173* 186* 153*  BUN 24* 26* 12 9 8   CREATININE 2.02* 1.96* 1.33* 1.27* 1.27*  CALCIUM 6.6* 7.1* 7.4* 7.6* 7.7*  MG  --   --   --   --  1.5*   ------------------------------------------------------------------------------------------------------------------ estimated creatinine clearance is 36.6 mL/min (by C-G formula based on Cr of 1.27). ------------------------------------------------------------------------------------------------------------------ No results for input(s): HGBA1C in the last 72 hours. ------------------------------------------------------------------------------------------------------------------ No results for input(s): CHOL, HDL, LDLCALC, TRIG, CHOLHDL, LDLDIRECT in the last 72 hours. ------------------------------------------------------------------------------------------------------------------ No results for input(s): TSH, T4TOTAL, T3FREE, THYROIDAB in the last 72 hours.  Invalid input(s): FREET3 ------------------------------------------------------------------------------------------------------------------  Recent  Labs  09/10/14 1345 09/10/14 1350  VITAMINB12 552  --   FOLATE 21.9  --   FERRITIN 246  --   TIBC 155*  --   IRON 11*  --   RETICCTPCT  --  0.6    Coagulation profile No results for input(s): INR, PROTIME in the last 168 hours.  No results for input(s): DDIMER in the last 72 hours.  Cardiac Enzymes No results for input(s): CKMB, TROPONINI, MYOGLOBIN in the last 168 hours.  Invalid input(s): CK ------------------------------------------------------------------------------------------------------------------ Invalid input(s): POCBNP    Miniya Miguez D.O. on 09/12/2014 at 10:43 AM  Between  7am to 7pm - Pager - [phone]  After 7pm go to [email or web address] - password [email or web address]  And look for the night coverage person covering for me after hours  [facility] Office  [phone]

## 2014-09-13 LAB — CULTURE, BLOOD (ROUTINE X 2)
CULTURE: NO GROWTH
Culture: NO GROWTH

## 2014-09-13 LAB — GLUCOSE, CAPILLARY
GLUCOSE-CAPILLARY: 221 mg/dL — AB (ref 65–99)
Glucose-Capillary: 136 mg/dL — ABNORMAL HIGH (ref 65–99)
Glucose-Capillary: 231 mg/dL — ABNORMAL HIGH (ref 65–99)

## 2014-09-13 LAB — OCCULT BLOOD X 1 CARD TO LAB, STOOL: FECAL OCCULT BLD: NEGATIVE

## 2014-09-13 NOTE — Progress Notes (Addendum)
Urology Inpatient Progress Report LEFT URETERAL STONE 09/05/2014  Intv/Subj: Discharge aborted because of unmet home health needs. No acute events overnight. Patient is without complaint.  Past Medical History  Diagnosis Date  . Blood transfusion without reported diagnosis years ago  . Diabetes mellitus without complication   . Hypertension   . UTI (lower urinary tract infection)   . CHF (congestive heart failure) years ago    no current cardiologist, pt does not remember having chf  . History of kidney stones     x 2 in past   Current Facility-Administered Medications  Medication Dose Route Frequency Provider Last Rate Last Dose  . acetaminophen (TYLENOL) suppository 650 mg  650 mg Rectal Q4H PRN Velvet Bathe, MD   650 mg at 09/05/14 1741  . acetaminophen (TYLENOL) tablet 650 mg  650 mg Oral Q4H PRN Festus Aloe, MD   650 mg at 09/05/14 2348  . anidulafungin (ERAXIS) 100 mg in sodium chloride 0.9 % 100 mL IVPB  100 mg Intravenous Q24H Carlyle Basques, MD   100 mg at 09/13/14 6629  . docusate sodium (COLACE) capsule 100 mg  100 mg Oral Daily PRN Festus Aloe, MD   100 mg at 09/05/14 2143  . docusate sodium (COLACE) capsule 100 mg  100 mg Oral BID Festus Aloe, MD   100 mg at 09/11/14 2132  . fentaNYL (SUBLIMAZE) injection 25 mcg  25 mcg Intravenous Q5 min PRN Franne Grip, MD      . insulin aspart (novoLOG) injection 0-15 Units  0-15 Units Subcutaneous TID WC Velvet Bathe, MD   2 Units at 09/13/14 [phone]  . insulin aspart (novoLOG) injection 0-5 Units  0-5 Units Subcutaneous QHS Velvet Bathe, MD   Stopped at 09/08/14 2132  . morphine 2 MG/ML injection 2-4 mg  2-4 mg Intravenous Q2H PRN Festus Aloe, MD   2 mg at 09/05/14 2051  . ondansetron (ZOFRAN) injection 4 mg  4 mg Intravenous Q4H PRN Festus Aloe, MD   4 mg at 09/09/14 1434  . oxyCODONE-acetaminophen (PERCOCET/ROXICET) 5-325 MG per tablet 1-2 tablet  1-2 tablet Oral Q4H PRN Festus Aloe, MD   2 tablet at  09/12/14 2029  . potassium chloride SA (K-DUR,KLOR-CON) CR tablet 40 mEq  40 mEq Oral BID Maryann Mikhail, DO   40 mEq at 09/12/14 2029  . sodium chloride 0.9 % injection 10-40 mL  10-40 mL Intracatheter PRN Festus Aloe, MD   10 mL at 09/12/14 2114  . zolpidem (AMBIEN) tablet 5 mg  5 mg Oral QHS PRN Festus Aloe, MD         Objective: Vital: Filed Vitals:   09/12/14 0615 09/12/14 1451 09/12/14 2001 09/13/14 0631  BP: 155/57 140/50 161/57 168/61  Pulse: 82 71 85 85  Temp: 98.4 F (36.9 C) 98.5 F (36.9 C) 98.1 F (36.7 C) 98.8 F (37.1 C)  TempSrc: Oral Oral Oral Oral  Resp: 18 16 20 24   Height:      Weight:      SpO2: 94% 93% 99% 92%   I/Os: I/O last 3 completed shifts: In: 37 [P.O.:560; IV Piggyback:100] Out: -   Physical Exam:  General: Patient is in no apparent distress Lungs: Normal respiratory effort, chest expands symmetrically. GI: The abdomen is soft and nontender without mass. Ext: lower extremities symmetric  Lab Results:  Recent Labs  09/11/14 0420 09/12/14 0455  WBC 6.7 7.4  HGB 7.3* 8.8*  HCT 21.6* 26.2*    Recent Labs  09/11/14 0420 09/12/14 0455  NA 135 134*  K 3.2* 3.0*  CL 106 104  CO2 24 25  GLUCOSE 186* 153*  BUN 9 8  CREATININE 1.27* 1.27*  CALCIUM 7.6* 7.7*   No results for input(s): LABPT, INR in the last 72 hours. No results for input(s): LABURIN in the last 72 hours. Results for orders placed or performed during the hospital encounter of 09/05/14  Urine culture     Status: None   Collection Time: 09/05/14 11:33 AM  Result Value Ref Range Status   Specimen Description URINE, CATHETERIZED CYSTOCOPE  Final   Special Requests NONE  Final   Culture   Final    >=100,000 COLONIES/mL YEAST Performed at [facility]    Report Status 09/07/2014 FINAL  Final  Culture, blood (routine x 2)     Status: None   Collection Time: 09/05/14  5:30 PM  Result Value Ref Range Status   Specimen Description BLOOD LEFT HAND   Final   Special Requests BOTTLES DRAWN AEROBIC ONLY 4CC  Final   Culture  Setup Time   Final    YEAST AEROBIC BOTTLE ONLY CRITICAL RESULT CALLED TO, READ BACK BY AND VERIFIED WITHTillman Sers RN [phone] 2325 GREEN R    Culture CANDIDA KRUSEI Performed at [facility]   Final   Report Status 09/09/2014 FINAL  Final  Culture, blood (routine x 2)     Status: None   Collection Time: 09/05/14  5:40 PM  Result Value Ref Range Status   Specimen Description BLOOD LEFT ARM  Final   Special Requests BOTTLES DRAWN AEROBIC AND ANAEROBIC 10CC  Final   Culture  Setup Time   Final    YEAST AEROBIC BOTTLE ONLY CRITICAL RESULT CALLED TO, READ BACK BY AND VERIFIED WITH: Loyal Buba AT 2325 09/06/14 BY RGREEN    Culture CANDIDA KRUSEI Performed at [facility]   Final   Report Status 09/09/2014 FINAL  Final  Culture, blood (routine x 2)     Status: None (Preliminary result)   Collection Time: 09/08/14  5:20 AM  Result Value Ref Range Status   Specimen Description BLOOD RIGHT ARM  Final   Special Requests BOTTLES DRAWN AEROBIC ONLY 0.5CC  Final   Culture   Final    NO GROWTH 4 DAYS Performed at [facility]    Report Status PENDING  Incomplete  Culture, blood (routine x 2)     Status: None (Preliminary result)   Collection Time: 09/08/14  5:25 AM  Result Value Ref Range Status   Specimen Description BLOOD RIGHT HAND  Final   Special Requests BOTTLES DRAWN AEROBIC ONLY Rebecca  Final   Culture   Final    NO GROWTH 4 DAYS Performed at [facility]    Report Status PENDING  Incomplete    Studies/Results:   Assessment: 8 Days Post-Op  Plan: Continue IV Eraxis. Regular diet Hep-Lock IV   Louis Meckel W 09/13/2014, 10:28 AM

## 2014-09-13 NOTE — Progress Notes (Signed)
[facility]                                                                              Patient Demographics  Ruth Douglas, is a 74 y.o. female, DOB - 1941-01-18, STM:[id number]  Admit date - 09/05/2014   Admitting Physician Festus Aloe, MD  Outpatient Primary MD for the patient is No PCP Per Patient  LOS - 8   No chief complaint on file.      Assessment & Plan   Sepsis secondary to Candidemia -Infectious disease currently on board and managing -Patient has been afebrile for the past 24hours. Currently no leukocytosis -Repeat blood cultures 09/08/2014 show no growth to date -Continue Eraxis (patient will need 14day course)- started on 09/08/2014 -PICC line placed -Patient will need to follow up with Dr. Johnnye Sima in 10 days.   Chronic Anemia vs Acute blood loss Anemia from possible hematoma -Baseline hemoglobin appears to be 9 -Currently, hemoglobin 8.8 (after 1uPRBCs) -Drop likely dilutional as patient has been receiving normal saline -IV fluids discontinued -FOBT pending  -Anemia panel: Iron 11, ferritin 246, saturation ratio 7 -Dose of Feraheme given  DM2 (diabetes mellitus, type 2) -Continue diabetic diet and sliding scale insulin while hospitalized -Once discharged would place on diabetic diet with instructions for patient to follow-up with primary care physician for further evaluation recommendations -Hemoglobin A1c on 06/19/2014 11.9  CKD (chronic kidney disease), stage 3 -Creatinine currently trending down, currently 1.27  Hyponatremia -Suspect secondary to poor oral solute intake.  -Improving  Essential hypertension -Amlodipine initially held, but may resume at discharge  Kidney stone on left side -Patient is status post left ureteroscopy and holmium laser lithotripsy and stent placement. -Urology managing, stent removed -CT abd/pelvis 09/11/2014: Left sided subscapsular fluid collection, favor hematoma  Hypokalemia and hypomagnesemia -K  3.0, Mag 1.5- will continue to monitor while admitted.  Code Status: Full  Family Communication: None at bedside  Disposition Plan: Admitted.  Hemoglobin stable.   Patient could not be discharged on 7/15 due to failure to find [facility] agency.  Plan is to discharge patient to home on Monday, 09/15/2014 with outpatient medication administration at [facility].   Time Spent in minutes   30 minutes  Procedures  Left ureteroscopy  Consults   [facility] Infectious disease  DVT Prophylaxis  SCDs  Lab Results  Component Value Date   PLT 307 09/12/2014    Medications  Scheduled Meds: . anidulafungin  100 mg Intravenous Q24H  . docusate sodium  100 mg Oral BID  . insulin aspart  0-15 Units Subcutaneous TID WC  . insulin aspart  0-5 Units Subcutaneous QHS  . potassium chloride  40 mEq Oral BID   Continuous Infusions:  PRN Meds:.acetaminophen, acetaminophen, docusate sodium, fentaNYL (SUBLIMAZE) injection, morphine injection, ondansetron, oxyCODONE-acetaminophen, sodium chloride, zolpidem  Antibiotics    Anti-infectives    Start     Dose/Rate Route Frequency Ordered Stop   09/12/14 0000  anidulafungin 100 mg in sodium chloride 0.9 % 100 mL     100 mg over 90 Minutes Intravenous Every 24 hours 09/12/14 0929     09/08/14 0800  anidulafungin (ERAXIS) 100 mg in sodium chloride 0.9 % 100 mL IVPB  100 mg over 90 Minutes Intravenous Every 24 hours 09/07/14 1136     09/07/14 1800  vancomycin (VANCOCIN) IVPB 1000 mg/200 mL premix  Status:  Discontinued     1,000 mg 200 mL/hr over 60 Minutes Intravenous Every 48 hours 09/06/14 1303 09/07/14 1136   09/07/14 1400  fluconazole (DIFLUCAN) IVPB 400 mg  Status:  Discontinued     400 mg 100 mL/hr over 120 Minutes Intravenous Every 24 hours 09/07/14 1134 09/07/14 1136   09/07/14 1215  anidulafungin (ERAXIS) 200 mg in sodium chloride 0.9 % 200 mL IVPB     200 mg over 180 Minutes Intravenous  Once 09/07/14 1136 09/07/14 1522   09/06/14 1600  vancomycin  (VANCOCIN) IVPB 750 mg/150 ml premix  Status:  Discontinued     750 mg 150 mL/hr over 60 Minutes Intravenous Every 24 hours 09/05/14 1709 09/06/14 1256   09/06/14 1400  piperacillin-tazobactam (ZOSYN) IVPB 2.25 g  Status:  Discontinued     2.25 g 100 mL/hr over 30 Minutes Intravenous 3 times per day 09/06/14 0908 09/07/14 1659   09/06/14 1400  fluconazole (DIFLUCAN) IVPB 100 mg  Status:  Discontinued     100 mg 50 mL/hr over 60 Minutes Intravenous Every 24 hours 09/06/14 1307 09/07/14 1134   09/06/14 0000  piperacillin-tazobactam (ZOSYN) IVPB 3.375 g  Status:  Discontinued     3.375 g 12.5 mL/hr over 240 Minutes Intravenous Every 8 hours 09/05/14 1709 09/06/14 0907   09/05/14 1730  vancomycin (VANCOCIN) IVPB 1000 mg/200 mL premix     1,000 mg 200 mL/hr over 60 Minutes Intravenous  Once 09/05/14 1709 09/05/14 1839   09/05/14 1730  piperacillin-tazobactam (ZOSYN) IVPB 3.375 g     3.375 g 100 mL/hr over 30 Minutes Intravenous  Once 09/05/14 1709 09/05/14 1809   09/05/14 0903  ceFAZolin (ANCEF) IVPB 2 g/50 mL premix     2 g 100 mL/hr over 30 Minutes Intravenous 30 min pre-op 09/05/14 1610 09/05/14 1120      Subjective:   Ruth Douglas seen and examined today.  No complaints this morning. States she will go [employer] next week for daily injections. Denies chest pain, SOB, abdominal pain, nausea, vomiting, diarrhea, constipation.   Objective:   Filed Vitals:   09/12/14 0615 09/12/14 1451 09/12/14 2001 09/13/14 0631  BP: 155/57 140/50 161/57 168/61  Pulse: 82 71 85 85  Temp: 98.4 F (36.9 C) 98.5 F (36.9 C) 98.1 F (36.7 C) 98.8 F (37.1 C)  TempSrc: Oral Oral Oral Oral  Resp: 18 16 20 24   Height:      Weight:      SpO2: 94% 93% 99% 92%    Wt Readings from Last 3 Encounters:  09/05/14 65.5 kg (144 lb 6.4 oz)  08/26/14 64.955 kg (143 lb 3.2 oz)  08/10/14 68.947 kg (152 lb)     Intake/Output Summary (Last 24 hours) at 09/13/14 1401 Last data filed at 09/13/14 0800   Gross per 24 hour  Intake    660 ml  Output      0 ml  Net    660 ml    Exam  General: Well developed, well nourished, no distress  HEENT: NCAT,mucous membranes moist.   Cardiovascular: S1 S2 auscultated, no M/R/G, RRR  Respiratory: Clear to auscultation  Abdomen: Soft, mild suprapubic tenderness, nondistended, + bowel sounds  Extremities: warm dry without cyanosis clubbing. Trace LE edema.   Neuro: AAOx3, nonfocal  Psych: Normal affect and demeanor, pleasant  Data Review  Micro Results Recent Results (from the past 240 hour(s))  Urine culture     Status: None   Collection Time: 09/05/14 11:33 AM  Result Value Ref Range Status   Specimen Description URINE, CATHETERIZED CYSTOCOPE  Final   Special Requests NONE  Final   Culture   Final    >=100,000 COLONIES/mL YEAST Performed at [facility]    Report Status 09/07/2014 FINAL  Final  Culture, blood (routine x 2)     Status: None   Collection Time: 09/05/14  5:30 PM  Result Value Ref Range Status   Specimen Description BLOOD LEFT HAND  Final   Special Requests BOTTLES DRAWN AEROBIC ONLY 4CC  Final   Culture  Setup Time   Final    YEAST AEROBIC BOTTLE ONLY CRITICAL RESULT CALLED TO, READ BACK BY AND VERIFIED WITHTillman Sers RN [phone] GREEN R    Culture CANDIDA KRUSEI Performed at [facility]   Final   Report Status 09/09/2014 FINAL  Final  Culture, blood (routine x 2)     Status: None   Collection Time: 09/05/14  5:40 PM  Result Value Ref Range Status   Specimen Description BLOOD LEFT ARM  Final   Special Requests BOTTLES DRAWN AEROBIC AND ANAEROBIC 10CC  Final   Culture  Setup Time   Final    YEAST AEROBIC BOTTLE ONLY CRITICAL RESULT CALLED TO, READ BACK BY AND VERIFIED WITH: Loyal Buba AT 2325 09/06/14 BY RGREEN    Culture CANDIDA KRUSEI Performed at [facility]   Final   Report Status 09/09/2014 FINAL  Final  Culture, blood (routine x 2)     Status: None   Collection Time:  09/08/14  5:20 AM  Result Value Ref Range Status   Specimen Description BLOOD RIGHT ARM  Final   Special Requests BOTTLES DRAWN AEROBIC ONLY 0.5CC  Final   Culture   Final    NO GROWTH 5 DAYS Performed at [facility]    Report Status 09/13/2014 FINAL  Final  Culture, blood (routine x 2)     Status: None   Collection Time: 09/08/14  5:25 AM  Result Value Ref Range Status   Specimen Description BLOOD RIGHT HAND  Final   Special Requests BOTTLES DRAWN AEROBIC ONLY [place]  Final   Culture   Final    NO GROWTH 5 DAYS Performed at [facility]    Report Status 09/13/2014 FINAL  Final    Radiology Reports Ct Abdomen Pelvis W Contrast  09/11/2014   CLINICAL DATA:  Fever of unknown origin  EXAM: CT ABDOMEN AND PELVIS WITH CONTRAST  TECHNIQUE: Multidetector CT imaging of the abdomen and pelvis was performed using the standard protocol following bolus administration of intravenous contrast.  CONTRAST:  125mL OMNIPAQUE IOHEXOL 300 MG/ML  SOLN  COMPARISON:  08/08/2014  FINDINGS: Lower chest: There are small bilateral pleural effusions. Overlying compressive type atelectasis is identified.  Hepatobiliary: No suspicious liver abnormality. The gallbladder appears normal. There is no biliary dilatation.  Pancreas: Normal appearance of the pancreas.  Spleen: The spleen is unremarkable.  Adrenals/Urinary Tract: The adrenal glands are both normal. Two cysts are noted within the upper pole of the right kidney with attenuation and enhancement characteristics compatible with Bosniak 1 type lesions. Subcapsular hematoma is identified involving the left kidney. This measures 6.8 x 3.0 x 8.2 cm. There is no gas within the subcapsular fluid collection. 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 Left-sided pelvocaliectasis noted. There is asymmetric enhancement of the mildly dilated left ureter. No ureteral calculi identified. Bladder wall thickening is again noted. No  focal filling defects within the bladder noted. On the delayed images there is symmetric excretion of contrast material from both kidneys. The entire left ureter is opacified and appears patent.  Stomach/Bowel: The stomach is within normal limits. The small bowel loops have a normal course and caliber. No obstruction. Normal appearance of the colon. The appendix is visualized and appears normal.  Vascular/Lymphatic: Calcified atherosclerotic disease involves the abdominal aorta. No aneurysm. No enlarged retroperitoneal or mesenteric adenopathy. No enlarged pelvic or inguinal lymph nodes.  Reproductive: The uterus and adnexal structures are unremarkable.  Other: There is a trace amount of free fluid within the dependent portion of the pelvis.  Musculoskeletal: The visualized bony structures are on unremarkable.  IMPRESSION: 1. Left-sided subcapsular fluid collection. Favor hematoma status post lithotripsy. In the setting of fever infected hematoma cannot be excluded. 2. Diffuse bladder wall thickening identified compatible with cystitis. 3. Right renal cyst. 4. Aortic atherosclerosis.   Electronically Signed   By: Kerby Moors M.D.   On: 09/11/2014 15:57    CBC  Recent Labs Lab 09/07/14 0520 09/07/14 1600 09/08/14 0525 09/10/14 0952 09/11/14 0420 09/12/14 0455  WBC 13.7*  --  12.4* 6.5 6.7 7.4  HGB 7.4* 7.9* 8.1* 7.6* 7.3* 8.8*  HCT 23.5* 24.4* 25.5* 23.7* 21.6* 26.2*  PLT 205  --  224 204 225 307  MCV 83.0  --  83.1 79.5 79.1 82.1  MCH 26.1  --  26.4 25.5* 26.7 27.6  MCHC 31.5  --  31.8 32.1 33.8 33.6  RDW 14.1  --  14.3 14.0 14.1 14.6  LYMPHSABS  --   --   --  1.0  --   --   MONOABS  --   --   --  0.5  --   --   EOSABS  --   --   --  0.1  --   --   BASOSABS  --   --   --  0.0  --   --     Chemistries   Recent Labs Lab 09/07/14 0520 09/08/14 0525 09/10/14 0952 09/11/14 0420 09/12/14 0455  NA 133* 129* 133* 135 134*  K 4.0 3.8 3.2* 3.2* 3.0*  CL 105 103 107 106 104  CO2 22 18*  19* 24 25  GLUCOSE 123* 104* 173* 186* 153*  BUN 24* 26* 12 9 8   CREATININE 2.02* 1.96* 1.33* 1.27* 1.27*  CALCIUM 6.6* 7.1* 7.4* 7.6* 7.7*  MG  --   --   --   --  1.5*   ------------------------------------------------------------------------------------------------------------------ estimated creatinine clearance is 36.6 mL/min (by C-G formula based on Cr of 1.27). ------------------------------------------------------------------------------------------------------------------ No results for input(s): HGBA1C in the last 72 hours. ------------------------------------------------------------------------------------------------------------------ No results for input(s): CHOL, HDL, LDLCALC, TRIG, CHOLHDL, LDLDIRECT in the last 72 hours. ------------------------------------------------------------------------------------------------------------------ No results for input(s): TSH, T4TOTAL, T3FREE, THYROIDAB in the last 72 hours.  Invalid input(s): FREET3 ------------------------------------------------------------------------------------------------------------------ No results for input(s): VITAMINB12, FOLATE, FERRITIN, TIBC, IRON, RETICCTPCT in the last 72 hours.  Coagulation profile No results for input(s): INR, PROTIME in the last 168 hours.  No results for input(s): DDIMER in the last 72 hours.  Cardiac Enzymes No results for input(s): CKMB, TROPONINI, MYOGLOBIN in the last 168 hours.  Invalid input(s): CK ------------------------------------------------------------------------------------------------------------------ Invalid input(s): POCBNP    Sheridan Hew D.O. on 09/13/2014 at 2:01 PM  Between 7am to 7pm - Pager - [phone]  After 7pm go to [email or web address] - password [email or web address]  And look for the night coverage person covering for me after hours  [facility] Office  [phone]

## 2014-09-14 LAB — BASIC METABOLIC PANEL
Anion gap: 5 (ref 5–15)
BUN: 8 mg/dL (ref 6–20)
CALCIUM: 7.9 mg/dL — AB (ref 8.9–10.3)
CO2: 29 mmol/L (ref 22–32)
CREATININE: 1.04 mg/dL — AB (ref 0.44–1.00)
Chloride: 101 mmol/L (ref 101–111)
GFR calc Af Amer: >60 mL/min (ref >60)
GFR calc non Af Amer: 53 mL/min — ABNORMAL LOW (ref >60)
GLUCOSE: 139 mg/dL — AB (ref 65–99)
Potassium: 4.2 mmol/L (ref 3.5–5.1)
Sodium: 135 mmol/L (ref 135–145)

## 2014-09-14 LAB — GLUCOSE, CAPILLARY
Glucose-Capillary: 126 mg/dL — ABNORMAL HIGH (ref 65–99)
Glucose-Capillary: 157 mg/dL — ABNORMAL HIGH (ref 65–99)
Glucose-Capillary: 189 mg/dL — ABNORMAL HIGH (ref 65–99)
Glucose-Capillary: 223 mg/dL — ABNORMAL HIGH (ref 65–99)
Glucose-Capillary: 264 mg/dL — ABNORMAL HIGH (ref 65–99)

## 2014-09-14 LAB — MAGNESIUM: Magnesium: 1.9 mg/dL (ref 1.7–2.4)

## 2014-09-14 MED ORDER — AMLODIPINE BESYLATE 5 MG PO TABS
5.0000 mg | ORAL_TABLET | Freq: Every day | ORAL | Status: DC
  Administered 2014-09-14 – 2014-09-15 (×2): 5 mg via ORAL
  Filled 2014-09-14 (×2): qty 1

## 2014-09-14 MED ORDER — TRAMADOL HCL 50 MG PO TABS
50.0000 mg | ORAL_TABLET | Freq: Four times a day (QID) | ORAL | Status: DC | PRN
  Administered 2014-09-14 – 2014-09-15 (×2): 100 mg via ORAL
  Filled 2014-09-14 (×2): qty 2

## 2014-09-14 MED ORDER — ACETAMINOPHEN 500 MG PO TABS
1000.0000 mg | ORAL_TABLET | ORAL | Status: DC | PRN
  Administered 2014-09-14: 1000 mg via ORAL
  Filled 2014-09-14: qty 2

## 2014-09-14 MED ORDER — BARRIER CREAM NON-SPECIFIED
1.0000 "application " | TOPICAL_CREAM | Freq: Two times a day (BID) | TOPICAL | Status: DC | PRN
  Filled 2014-09-14: qty 1

## 2014-09-14 MED ORDER — KETOROLAC TROMETHAMINE 15 MG/ML IJ SOLN
15.0000 mg | Freq: Four times a day (QID) | INTRAMUSCULAR | Status: DC | PRN
  Administered 2014-09-14: 15 mg via INTRAVENOUS
  Filled 2014-09-14: qty 1

## 2014-09-14 MED ORDER — MIRABEGRON ER 50 MG PO TB24
50.0000 mg | ORAL_TABLET | Freq: Every day | ORAL | Status: DC
  Administered 2014-09-14 – 2014-09-15 (×2): 50 mg via ORAL
  Filled 2014-09-14 (×2): qty 1

## 2014-09-14 NOTE — Progress Notes (Signed)
[facility]                                                                              Patient Demographics  Ruth Douglas, is a 74 y.o. female, DOB - [birth year], QBH:[id number]  Admit date - 09/05/2014   Admitting Physician Festus Aloe, MD  Outpatient Primary MD for the patient is No PCP Per Patient  LOS - 9   No chief complaint on file.  Interim history Consulted for medical management: Anemia.  Patient is to be discharged by urology 7/18.  Discharged held prior as [facility] could not be arranged for patient in [place].  Plan is for discharge to home, and she will follow up with Forestine Na daily for Eraxis injections for candidemia.      Assessment & Plan   Sepsis secondary to Candidemia -Infectious disease currently on board and managing -Patient has been afebrile for the past 24hours. Currently no leukocytosis -Repeat blood cultures 09/08/2014 show no growth to date -Continue Eraxis (patient will need 14day course)- started on 09/08/2014 -PICC line placed -Patient will need to follow up with Dr. Johnnye Sima in 10 days.   Chronic Anemia vs Acute blood loss Anemia from possible hematoma -Baseline hemoglobin appears to be 9 -Currently, hemoglobin 8.8 (after 1uPRBCs) -Drop likely dilutional as patient has been receiving normal saline -IV fluids discontinued -FOBT negative  -Anemia panel: Iron 11, ferritin 246, saturation ratio 7 -Dose of Feraheme given  DM2 (diabetes mellitus, type 2) -Continue diabetic diet and sliding scale insulin while hospitalized -Once discharged would place on diabetic diet with instructions for patient to follow-up with primary care physician for further evaluation recommendations -Hemoglobin A1c on 06/19/2014 11.9  CKD (chronic kidney disease), stage 3 -Creatinine currently trending down, currently 1.27  Hyponatremia -Suspect secondary to poor oral solute intake.  -Improving  Essential hypertension -Amlodipine initially held, but may resume  at discharge  Kidney stone on left side -Patient is status post left ureteroscopy and holmium laser lithotripsy and stent placement. -Urology managing, stent removed -CT abd/pelvis 09/11/2014: Left sided subscapsular fluid collection, favor hematoma  Hypokalemia and hypomagnesemia -Resolved.  Potassium 4.2, Magnesium 1.9  Code Status: Full  Family Communication: None at bedside  Disposition Plan: Admitted.  Hemoglobin stable.   Patient could not be discharged on 7/15 due to failure to find [facility] agency.  Plan is to discharge patient to home on Monday, 09/15/2014 with outpatient medication administration at [facility].   Time Spent in minutes   30 minutes  Procedures  Left ureteroscopy  Consults   TR[facility] Infectious disease  DVT Prophylaxis  SCDs  Lab Results  Component Value Date   PLT 307 09/12/2014    Medications  Scheduled Meds: . anidulafungin  100 mg Intravenous Q24H  . docusate sodium  100 mg Oral BID  . insulin aspart  0-15 Units Subcutaneous TID WC  . insulin aspart  0-5 Units Subcutaneous QHS  . mirabegron ER  50 mg Oral Daily  . potassium chloride  40 mEq Oral BID   Continuous Infusions:  PRN Meds:.acetaminophen, barrier cream, docusate sodium, ketorolac, ondansetron, sodium chloride, traMADol, zolpidem  Antibiotics    Anti-infectives    Start  Dose/Rate Route Frequency Ordered Stop   09/12/14 0000  anidulafungin 100 mg in sodium chloride 0.9 % 100 mL     100 mg over 90 Minutes Intravenous Every 24 hours 09/12/14 0929     09/08/14 0800  anidulafungin (ERAXIS) 100 mg in sodium chloride 0.9 % 100 mL IVPB     100 mg over 90 Minutes Intravenous Every 24 hours 09/07/14 1136     09/07/14 1800  vancomycin (VANCOCIN) IVPB 1000 mg/200 mL premix  Status:  Discontinued     1,000 mg 200 mL/hr over 60 Minutes Intravenous Every 48 hours 09/06/14 1303 09/07/14 1136   09/07/14 1400  fluconazole (DIFLUCAN) IVPB 400 mg  Status:  Discontinued     400 mg 100 mL/hr over 120  Minutes Intravenous Every 24 hours 09/07/14 1134 09/07/14 1136   09/07/14 1215  anidulafungin (ERAXIS) 200 mg in sodium chloride 0.9 % 200 mL IVPB     200 mg over 180 Minutes Intravenous  Once 09/07/14 1136 09/07/14 1522   09/06/14 1600  vancomycin (VANCOCIN) IVPB 750 mg/150 ml premix  Status:  Discontinued     750 mg 150 mL/hr over 60 Minutes Intravenous Every 24 hours 09/05/14 1709 09/06/14 1256   09/06/14 1400  piperacillin-tazobactam (ZOSYN) IVPB 2.25 g  Status:  Discontinued     2.25 g 100 mL/hr over 30 Minutes Intravenous 3 times per day 09/06/14 0908 09/07/14 1659   09/06/14 1400  fluconazole (DIFLUCAN) IVPB 100 mg  Status:  Discontinued     100 mg 50 mL/hr over 60 Minutes Intravenous Every 24 hours 09/06/14 1307 09/07/14 1134   09/06/14 0000  piperacillin-tazobactam (ZOSYN) IVPB 3.375 g  Status:  Discontinued     3.375 g 12.5 mL/hr over 240 Minutes Intravenous Every 8 hours 09/05/14 1709 09/06/14 0907   09/05/14 1730  vancomycin (VANCOCIN) IVPB 1000 mg/200 mL premix     1,000 mg 200 mL/hr over 60 Minutes Intravenous  Once 09/05/14 1709 09/05/14 1839   09/05/14 1730  piperacillin-tazobactam (ZOSYN) IVPB 3.375 g     3.375 g 100 mL/hr over 30 Minutes Intravenous  Once 09/05/14 1709 09/05/14 1809   09/05/14 0903  ceFAZolin (ANCEF) IVPB 2 g/50 mL premix     2 g 100 mL/hr over 30 Minutes Intravenous 30 min pre-op 09/05/14 2778 09/05/14 1120      Subjective:   Ruth Douglas seen and examined today.  No complaints this morning.  Denies chest pain, SOB, abdominal pain, nausea, vomiting, diarrhea, constipation.   Objective:   Filed Vitals:   09/13/14 0631 09/13/14 1530 09/13/14 2145 09/14/14 0434  BP: 168/61 149/51 154/80 177/65  Pulse: 85 75 82 84  Temp: 98.8 F (37.1 C) 98.6 F (37 C) 98.1 F (36.7 C) 98.1 F (36.7 C)  TempSrc: Oral Oral Oral Oral  Resp: 24 20 20 20   Height:      Weight:      SpO2: 92% 94% 98% 98%    Wt Readings from Last 3 Encounters:  09/05/14  65.5 kg (144 lb 6.4 oz)  08/26/14 64.955 kg (143 lb 3.2 oz)  08/10/14 68.947 kg (152 lb)     Intake/Output Summary (Last 24 hours) at 09/14/14 1133 Last data filed at 09/14/14 1100  Gross per 24 hour  Intake   1920 ml  Output      0 ml  Net   1920 ml    Exam  General: Well developed, well nourished, no distress   Cardiovascular: S1 S2 auscultated, no M/R/G,  RRR  Respiratory: Clear to auscultation  Neuro: AAOx3, nonfocal  Data Review   Micro Results Recent Results (from the past 240 hour(s))  Urine culture     Status: None   Collection Time: 09/05/14 11:33 AM  Result Value Ref Range Status   Specimen Description URINE, CATHETERIZED CYSTOCOPE  Final   Special Requests NONE  Final   Culture   Final    >=100,000 COLONIES/mL YEAST Performed at [facility]    Report Status 09/07/2014 FINAL  Final  Culture, blood (routine x 2)     Status: None   Collection Time: 09/05/14  5:30 PM  Result Value Ref Range Status   Specimen Description BLOOD LEFT HAND  Final   Special Requests BOTTLES DRAWN AEROBIC ONLY 4CC  Final   Culture  Setup Time   Final    YEAST AEROBIC BOTTLE ONLY CRITICAL RESULT CALLED TO, READ BACK BY AND VERIFIED WITHTillman Sers RN [phone] 2325 GREEN R    Culture CANDIDA KRUSEI Performed at [facility]   Final   Report Status 09/09/2014 FINAL  Final  Culture, blood (routine x 2)     Status: None   Collection Time: 09/05/14  5:40 PM  Result Value Ref Range Status   Specimen Description BLOOD LEFT ARM  Final   Special Requests BOTTLES DRAWN AEROBIC AND ANAEROBIC 10CC  Final   Culture  Setup Time   Final    YEAST AEROBIC BOTTLE ONLY CRITICAL RESULT CALLED TO, READ BACK BY AND VERIFIED WITH: Loyal Buba AT 2325 09/06/14 BY RGREEN    Culture CANDIDA KRUSEI Performed at [facility]   Final   Report Status 09/09/2014 FINAL  Final  Culture, blood (routine x 2)     Status: None   Collection Time: 09/08/14  5:20 AM  Result Value Ref Range  Status   Specimen Description BLOOD RIGHT ARM  Final   Special Requests BOTTLES DRAWN AEROBIC ONLY 0.5CC  Final   Culture   Final    NO GROWTH 5 DAYS Performed at [facility]    Report Status 09/13/2014 FINAL  Final  Culture, blood (routine x 2)     Status: None   Collection Time: 09/08/14  5:25 AM  Result Value Ref Range Status   Specimen Description BLOOD RIGHT HAND  Final   Special Requests BOTTLES DRAWN AEROBIC ONLY [place]  Final   Culture   Final    NO GROWTH 5 DAYS Performed at [facility]    Report Status 09/13/2014 FINAL  Final    Radiology Reports Ct Abdomen Pelvis W Contrast  09/11/2014   CLINICAL DATA:  Fever of unknown origin  EXAM: CT ABDOMEN AND PELVIS WITH CONTRAST  TECHNIQUE: Multidetector CT imaging of the abdomen and pelvis was performed using the standard protocol following bolus administration of intravenous contrast.  CONTRAST:  162mL OMNIPAQUE IOHEXOL 300 MG/ML  SOLN  COMPARISON:  08/08/2014  FINDINGS: Lower chest: There are small bilateral pleural effusions. Overlying compressive type atelectasis is identified.  Hepatobiliary: No suspicious liver abnormality. The gallbladder appears normal. There is no biliary dilatation.  Pancreas: Normal appearance of the pancreas.  Spleen: The spleen is unremarkable.  Adrenals/Urinary Tract: The adrenal glands are both normal. Two cysts are noted within the upper pole of the right kidney with attenuation and enhancement characteristics compatible with Bosniak 1 type lesions. Subcapsular hematoma is identified involving the left kidney. This measures 6.8 x 3.0 x 8.2 cm. There is no gas within the  subcapsular fluid collection. 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 Left-sided pelvocaliectasis noted. There is asymmetric enhancement of the mildly dilated left ureter. No ureteral calculi identified. Bladder wall thickening is again noted. No focal filling defects within the bladder  noted. On the delayed images there is symmetric excretion of contrast material from both kidneys. The entire left ureter is opacified and appears patent.  Stomach/Bowel: The stomach is within normal limits. The small bowel loops have a normal course and caliber. No obstruction. Normal appearance of the colon. The appendix is visualized and appears normal.  Vascular/Lymphatic: Calcified atherosclerotic disease involves the abdominal aorta. No aneurysm. No enlarged retroperitoneal or mesenteric adenopathy. No enlarged pelvic or inguinal lymph nodes.  Reproductive: The uterus and adnexal structures are unremarkable.  Other: There is a trace amount of free fluid within the dependent portion of the pelvis.  Musculoskeletal: The visualized bony structures are on unremarkable.  IMPRESSION: 1. Left-sided subcapsular fluid collection. Favor hematoma status post lithotripsy. In the setting of fever infected hematoma cannot be excluded. 2. Diffuse bladder wall thickening identified compatible with cystitis. 3. Right renal cyst. 4. Aortic atherosclerosis.   Electronically Signed   By: Kerby Moors M.D.   On: 09/11/2014 15:57    CBC  Recent Labs Lab 09/07/14 1600 09/08/14 0525 09/10/14 0952 09/11/14 0420 09/12/14 0455  WBC  --  12.4* 6.5 6.7 7.4  HGB 7.9* 8.1* 7.6* 7.3* 8.8*  HCT 24.4* 25.5* 23.7* 21.6* 26.2*  PLT  --  224 204 225 307  MCV  --  83.1 79.5 79.1 82.1  MCH  --  26.4 25.5* 26.7 27.6  MCHC  --  31.8 32.1 33.8 33.6  RDW  --  14.3 14.0 14.1 14.6  LYMPHSABS  --   --  1.0  --   --   MONOABS  --   --  0.5  --   --   EOSABS  --   --  0.1  --   --   BASOSABS  --   --  0.0  --   --     Chemistries   Recent Labs Lab 09/08/14 0525 09/10/14 0952 09/11/14 0420 09/12/14 0455 09/14/14 0515  NA 129* 133* 135 134* 135  K 3.8 3.2* 3.2* 3.0* 4.2  CL 103 107 106 104 101  CO2 18* 19* 24 25 29   GLUCOSE 104* 173* 186* 153* 139*  BUN 26* 12 9 8 8   CREATININE 1.96* 1.33* 1.27* 1.27* 1.04*  CALCIUM  7.1* 7.4* 7.6* 7.7* 7.9*  MG  --   --   --  1.5* 1.9   ------------------------------------------------------------------------------------------------------------------ estimated creatinine clearance is 44.6 mL/min (by C-G formula based on Cr of 1.04). ------------------------------------------------------------------------------------------------------------------ No results for input(s): HGBA1C in the last 72 hours. ------------------------------------------------------------------------------------------------------------------ No results for input(s): CHOL, HDL, LDLCALC, TRIG, CHOLHDL, LDLDIRECT in the last 72 hours. ------------------------------------------------------------------------------------------------------------------ No results for input(s): TSH, T4TOTAL, T3FREE, THYROIDAB in the last 72 hours.  Invalid input(s): FREET3 ------------------------------------------------------------------------------------------------------------------ No results for input(s): VITAMINB12, FOLATE, FERRITIN, TIBC, IRON, RETICCTPCT in the last 72 hours.  Coagulation profile No results for input(s): INR, PROTIME in the last 168 hours.  No results for input(s): DDIMER in the last 72 hours.  Cardiac Enzymes No results for input(s): CKMB, TROPONINI, MYOGLOBIN in the last 168 hours.  Invalid input(s): CK ------------------------------------------------------------------------------------------------------------------ Invalid input(s): POCBNP    Teon Hudnall D.O. on 09/14/2014 at 11:33 AM  Between 7am to 7pm - Pager - [phone]  After 7pm go to [email or web address] - password [email or web address]  And look for the night coverage person covering for me after hours  [facility] Office  [phone]

## 2014-09-14 NOTE — Progress Notes (Signed)
Urology Inpatient Progress Report LEFT URETERAL STONE 09/05/2014  Intv/Subj: Using Percocet 2 tablets q4 hrs for perineal discomfort. Stress incontinence - worse than baseline currently.   Past Medical History  Diagnosis Date  . Blood transfusion without reported diagnosis years ago  . Diabetes mellitus without complication   . Hypertension   . UTI (lower urinary tract infection)   . CHF (congestive heart failure) years ago    no current cardiologist, pt does not remember having chf  . History of kidney stones     x 2 in past   Current Facility-Administered Medications  Medication Dose Route Frequency Provider Last Rate Last Dose  . acetaminophen (TYLENOL) suppository 650 mg  650 mg Rectal Q4H PRN Velvet Bathe, MD   650 mg at 09/05/14 1741  . acetaminophen (TYLENOL) tablet 650 mg  650 mg Oral Q4H PRN Festus Aloe, MD   650 mg at 09/05/14 2348  . anidulafungin (ERAXIS) 100 mg in sodium chloride 0.9 % 100 mL IVPB  100 mg Intravenous Q24H Carlyle Basques, MD   100 mg at 09/13/14 9390  . docusate sodium (COLACE) capsule 100 mg  100 mg Oral Daily PRN Festus Aloe, MD   100 mg at 09/05/14 2143  . docusate sodium (COLACE) capsule 100 mg  100 mg Oral BID Festus Aloe, MD   100 mg at 09/11/14 2132  . fentaNYL (SUBLIMAZE) injection 25 mcg  25 mcg Intravenous Q5 min PRN Franne Grip, MD      . insulin aspart (novoLOG) injection 0-15 Units  0-15 Units Subcutaneous TID WC Velvet Bathe, MD   5 Units at 09/13/14 1841  . insulin aspart (novoLOG) injection 0-5 Units  0-5 Units Subcutaneous QHS Velvet Bathe, MD   Stopped at 09/08/14 2132  . mirabegron ER (MYRBETRIQ) tablet 50 mg  50 mg Oral Daily Ardis Hughs, MD      . ondansetron [facility] injection 4 mg  4 mg Intravenous Q4H PRN Festus Aloe, MD   4 mg at 09/09/14 1434  . oxyCODONE-acetaminophen (PERCOCET/ROXICET) 5-325 MG per tablet 1-2 tablet  1-2 tablet Oral Q4H PRN Festus Aloe, MD   2 tablet at 09/14/14 0546  . potassium  chloride SA (K-DUR,KLOR-CON) CR tablet 40 mEq  40 mEq Oral BID Maryann Mikhail, DO   40 mEq at 09/13/14 2248  . sodium chloride 0.9 % injection 10-40 mL  10-40 mL Intracatheter PRN Festus Aloe, MD   10 mL at 09/12/14 2114  . zolpidem (AMBIEN) tablet 5 mg  5 mg Oral QHS PRN Festus Aloe, MD   5 mg at 09/13/14 2249     Objective: Vital: Filed Vitals:   09/13/14 0631 09/13/14 1530 09/13/14 2145 09/14/14 0434  BP: 168/61 149/51 154/80 177/65  Pulse: 85 75 82 84  Temp: 98.8 F (37.1 C) 98.6 F (37 C) 98.1 F (36.7 C) 98.1 F (36.7 C)  TempSrc: Oral Oral Oral Oral  Resp: 24 20 20 20   Height:      Weight:      SpO2: 92% 94% 98% 98%   I/Os: I/O last 3 completed shifts: In: 1920 [P.O.:1920] Out: -   Physical Exam:  General: Patient is in no apparent distress Lungs: Normal respiratory effort, chest expands symmetrically. GI: The abdomen is soft and nontender without mass. Ext: lower extremities symmetric  Lab Results:  Recent Labs  09/12/14 0455  WBC 7.4  HGB 8.8*  HCT 26.2*    Recent Labs  09/12/14 0455 09/14/14 0515  NA 134* 135  K  3.0* 4.2  CL 104 101  CO2 25 29  GLUCOSE 153* 139*  BUN 8 8  CREATININE 1.27* 1.04*  CALCIUM 7.7* 7.9*   No results for input(s): LABPT, INR in the last 72 hours. No results for input(s): LABURIN in the last 72 hours. Results for orders placed or performed during the hospital encounter of 09/05/14  Urine culture     Status: None   Collection Time: 09/05/14 11:33 AM  Result Value Ref Range Status   Specimen Description URINE, CATHETERIZED CYSTOCOPE  Final   Special Requests NONE  Final   Culture   Final    >=100,000 COLONIES/mL YEAST Performed at [facility]    Report Status 09/07/2014 FINAL  Final  Culture, blood (routine x 2)     Status: None   Collection Time: 09/05/14  5:30 PM  Result Value Ref Range Status   Specimen Description BLOOD LEFT HAND  Final   Special Requests BOTTLES DRAWN AEROBIC ONLY 4CC   Final   Culture  Setup Time   Final    YEAST AEROBIC BOTTLE ONLY CRITICAL RESULT CALLED TO, READ BACK BY AND VERIFIED WITHTillman Sers RN [phone] 2325 GREEN R    Culture CANDIDA KRUSEI Performed at [facility]   Final   Report Status 09/09/2014 FINAL  Final  Culture, blood (routine x 2)     Status: None   Collection Time: 09/05/14  5:40 PM  Result Value Ref Range Status   Specimen Description BLOOD LEFT ARM  Final   Special Requests BOTTLES DRAWN AEROBIC AND ANAEROBIC 10CC  Final   Culture  Setup Time   Final    YEAST AEROBIC BOTTLE ONLY CRITICAL RESULT CALLED TO, READ BACK BY AND VERIFIED WITH: Loyal Buba AT 2325 09/06/14 BY RGREEN    Culture CANDIDA KRUSEI Performed at [facility]   Final   Report Status 09/09/2014 FINAL  Final  Culture, blood (routine x 2)     Status: None   Collection Time: 09/08/14  5:20 AM  Result Value Ref Range Status   Specimen Description BLOOD RIGHT ARM  Final   Special Requests BOTTLES DRAWN AEROBIC ONLY 0.5CC  Final   Culture   Final    NO GROWTH 5 DAYS Performed at [facility]    Report Status 09/13/2014 FINAL  Final  Culture, blood (routine x 2)     Status: None   Collection Time: 09/08/14  5:25 AM  Result Value Ref Range Status   Specimen Description BLOOD RIGHT HAND  Final   Special Requests BOTTLES DRAWN AEROBIC ONLY [place]  Final   Culture   Final    NO GROWTH 5 DAYS Performed at [facility]    Report Status 09/13/2014 FINAL  Final    Studies/Results:   Assessment: 9 Days Post-Op  Plan: Continue IV Eraxis. Regular diet Hep-Lock IV  D/c narcotic - use APAP and Toradol for pain. A&D ointment for perineal rash Taught patient to keagle this AM, expect leakage will improve back to baseline, added myrbetriq although shouldn't need this going home. [facility] service input. Awaiting home health services for discharge.   Louis Meckel W 09/14/2014, 8:17 AM

## 2014-09-14 NOTE — Progress Notes (Signed)
Continues to dribble urine incontinently "constantly" per pt. Pt very frustrated by this.   Having to change bed linens very frequently and has a large amount of tea/orange urine.  Continues to c/o perineal pain refusing barrier cream and no breakdown noted.  Giving pt percocet every 4 hours and noted to bring her enough relief to sleep inbetween linen changes.

## 2014-09-15 LAB — GLUCOSE, CAPILLARY
Glucose-Capillary: 181 mg/dL — ABNORMAL HIGH (ref 65–99)
Glucose-Capillary: 175 mg/dL — ABNORMAL HIGH (ref 65–99)

## 2014-09-15 LAB — OCCULT BLOOD X 1 CARD TO LAB, STOOL: Fecal Occult Bld: NEGATIVE

## 2014-09-15 MED ORDER — HEPARIN SOD (PORK) LOCK FLUSH 100 UNIT/ML IV SOLN
250.0000 [IU] | INTRAVENOUS | Status: AC | PRN
Start: 2014-09-15 — End: 2014-09-15
  Administered 2014-09-15: 250 [IU]

## 2014-09-15 MED ORDER — BARRIER CREAM NON-SPECIFIED: 1.0000 "application " | TOPICAL_CREAM | Freq: Two times a day (BID) | TOPICAL | Status: DC | PRN

## 2014-09-15 NOTE — Progress Notes (Signed)
Appointment made with [name], RN [phone] at [facility], on 09/16/14 8:00 AM for IV infusions.  Spoke with pt's son [name] [phone], instruction given to arrive at [facility] to Register pt at [facility] in am before 8:00 AM. [name] agreed to take his mother. Copy of orders faxed to [phone].

## 2014-09-15 NOTE — Progress Notes (Signed)
  No complaints.   Filed Vitals:   09/15/14 0539  BP: 150/60  Pulse:   Temp:   Resp:    AFVSS  Maybe home today returning to [facility] for daily infusion. Needs Eraxis through Sep 20, 2015.

## 2014-09-15 NOTE — Progress Notes (Signed)
Pt much improved, reports less pain, able to get oob to chair with one assist.  Continues to have a large, frequent amount of urination, using home depends, barrier cream and reinforcing K[employer] excercises.

## 2014-09-15 NOTE — Progress Notes (Signed)
Reviewed discharge information with patient and caregiver. Answered all questions. Patient/caregiver able to teach back medications and reasons to contact MD/911. Patient/caregiver verbalize understanding of caring for PICC line/observing for infection. Patient verbalizes importance of PCP follow up appointment and to follow up tmrw at [place] at 7;45 with prescription.  Barbee Shropshire. Brigitte Pulse, RN .

## 2014-09-16 ENCOUNTER — Encounter (HOSPITAL_COMMUNITY)
Admit: 2014-09-16 | Discharge: 2014-09-16 | Disposition: A | Discharge status: Home / Self Care | Payer: Medicare FFS | Attending: Urology | Admitting: Urology

## 2014-09-16 ENCOUNTER — Encounter (HOSPITAL_COMMUNITY): Payer: Self-pay

## 2014-09-16 DIAGNOSIS — A419 Sepsis, unspecified organism: Secondary | ICD-10-CM | POA: Insufficient documentation

## 2014-09-16 MED ORDER — SODIUM CHLORIDE 0.9 % IV SOLN
Freq: Once | INTRAVENOUS | Status: AC
  Administered 2014-09-16: 09:00:00 via INTRAVENOUS

## 2014-09-16 MED ORDER — SODIUM CHLORIDE 0.9 % IV SOLN
100.0000 mg | INTRAVENOUS | Status: DC
  Administered 2014-09-16: 100 mg via INTRAVENOUS
  Filled 2014-09-16: qty 100

## 2014-09-17 ENCOUNTER — Encounter (HOSPITAL_COMMUNITY)
Admission: RE | Admit: 2014-09-17 | Discharge: 2014-09-17 | Disposition: A | Discharge status: Home / Self Care | Payer: Medicare FFS | Source: Ambulatory Visit | Attending: Urology | Admitting: Urology

## 2014-09-17 DIAGNOSIS — A419 Sepsis, unspecified organism: Secondary | ICD-10-CM | POA: Diagnosis not present

## 2014-09-17 MED ORDER — SODIUM CHLORIDE 0.9 % IV SOLN
100.0000 mg | INTRAVENOUS | Status: DC
  Administered 2014-09-17: 100 mg via INTRAVENOUS
  Filled 2014-09-17: qty 100

## 2014-09-17 MED ORDER — SODIUM CHLORIDE 0.9 % IJ SOLN: 10.0000 mL | INTRAMUSCULAR | Status: DC | PRN

## 2014-09-17 MED ORDER — SODIUM CHLORIDE 0.9 % IV SOLN
Freq: Once | INTRAVENOUS | Status: AC
  Administered 2014-09-17: 08:00:00 via INTRAVENOUS

## 2014-09-18 ENCOUNTER — Encounter (HOSPITAL_COMMUNITY)
Admission: RE | Admit: 2014-09-18 | Discharge: 2014-09-18 | Disposition: A | Discharge status: Home / Self Care | Payer: Medicare FFS | Source: Ambulatory Visit | Attending: Urology | Admitting: Urology

## 2014-09-18 ENCOUNTER — Telehealth: Payer: Self-pay | Admitting: *Deleted

## 2014-09-18 DIAGNOSIS — A419 Sepsis, unspecified organism: Secondary | ICD-10-CM | POA: Diagnosis not present

## 2014-09-18 LAB — GLUCOSE, CAPILLARY: Glucose-Capillary: 202 mg/dL — ABNORMAL HIGH (ref 65–99)

## 2014-09-18 MED ORDER — SODIUM CHLORIDE 0.9 % IV SOLN
100.0000 mg | INTRAVENOUS | Status: DC
  Administered 2014-09-18: 100 mg via INTRAVENOUS
  Filled 2014-09-18: qty 100

## 2014-09-18 MED ORDER — SODIUM CHLORIDE 0.9 % IV SOLN
INTRAVENOUS | Status: DC
  Administered 2014-09-18: 09:00:00 via INTRAVENOUS

## 2014-09-18 MED ORDER — SODIUM CHLORIDE 0.9 % IJ SOLN: 10.0000 mL | INTRAMUSCULAR | Status: DC | PRN

## 2014-09-18 NOTE — Telephone Encounter (Signed)
Order faxed per Dr. Johnnye Sima to pull patient's picc line after last IV antibiotic dose on 09/20/14. Faxed to [facility] [phone].

## 2014-09-19 ENCOUNTER — Encounter (HOSPITAL_COMMUNITY)
Admission: RE | Admit: 2014-09-19 | Discharge: 2014-09-19 | Disposition: A | Discharge status: Home / Self Care | Payer: Medicare FFS | Source: Ambulatory Visit | Attending: Urology | Admitting: Urology

## 2014-09-19 DIAGNOSIS — A419 Sepsis, unspecified organism: Secondary | ICD-10-CM | POA: Diagnosis not present

## 2014-09-19 MED ORDER — SODIUM CHLORIDE 0.9 % IV SOLN
INTRAVENOUS | Status: DC
  Administered 2014-09-19: 200 mL via INTRAVENOUS

## 2014-09-19 MED ORDER — ANIDULAFUNGIN 100 MG IV SOLR
100.0000 mg | INTRAVENOUS | Status: DC
  Administered 2014-09-19: 100 mg via INTRAVENOUS
  Filled 2014-09-19: qty 100

## 2014-09-20 ENCOUNTER — Encounter (HOSPITAL_COMMUNITY)
Admission: RE | Admit: 2014-09-20 | Discharge: 2014-09-20 | Disposition: A | Discharge status: Home / Self Care | Payer: Medicare FFS | Source: Ambulatory Visit | Attending: Urology | Admitting: Urology

## 2014-09-20 DIAGNOSIS — N390 Urinary tract infection, site not specified: Secondary | ICD-10-CM | POA: Diagnosis not present

## 2014-09-20 DIAGNOSIS — E860 Dehydration: Secondary | ICD-10-CM | POA: Diagnosis not present

## 2014-09-20 MED ORDER — SODIUM CHLORIDE 0.9 % IV SOLN
100.0000 mg | INTRAVENOUS | Status: DC
  Filled 2014-09-20: qty 100

## 2014-09-23 ENCOUNTER — Inpatient Hospital Stay (HOSPITAL_COMMUNITY)
Admission: EM | Admit: 2014-09-23 | Discharge: 2014-09-25 | DRG: 690 | Disposition: A | Discharge status: Home / Self Care | Payer: Medicare FFS | Attending: Internal Medicine | Admitting: Internal Medicine

## 2014-09-23 ENCOUNTER — Encounter (HOSPITAL_COMMUNITY): Payer: Self-pay | Admitting: General Practice

## 2014-09-23 DIAGNOSIS — R109 Unspecified abdominal pain: Secondary | ICD-10-CM

## 2014-09-23 DIAGNOSIS — N189 Chronic kidney disease, unspecified: Secondary | ICD-10-CM | POA: Diagnosis present

## 2014-09-23 DIAGNOSIS — N179 Acute kidney failure, unspecified: Secondary | ICD-10-CM | POA: Diagnosis present

## 2014-09-23 DIAGNOSIS — Z66 Do not resuscitate: Secondary | ICD-10-CM | POA: Diagnosis present

## 2014-09-23 DIAGNOSIS — Z794 Long term (current) use of insulin: Secondary | ICD-10-CM

## 2014-09-23 DIAGNOSIS — Z89431 Acquired absence of right foot: Secondary | ICD-10-CM

## 2014-09-23 DIAGNOSIS — I129 Hypertensive chronic kidney disease with stage 1 through stage 4 chronic kidney disease, or unspecified chronic kidney disease: Secondary | ICD-10-CM | POA: Diagnosis present

## 2014-09-23 DIAGNOSIS — E860 Dehydration: Secondary | ICD-10-CM | POA: Diagnosis present

## 2014-09-23 DIAGNOSIS — Z89432 Acquired absence of left foot: Secondary | ICD-10-CM

## 2014-09-23 DIAGNOSIS — N814 Uterovaginal prolapse, unspecified: Secondary | ICD-10-CM | POA: Diagnosis present

## 2014-09-23 DIAGNOSIS — E119 Type 2 diabetes mellitus without complications: Secondary | ICD-10-CM

## 2014-09-23 DIAGNOSIS — E1122 Type 2 diabetes mellitus with diabetic chronic kidney disease: Secondary | ICD-10-CM | POA: Diagnosis not present

## 2014-09-23 DIAGNOSIS — Z960 Presence of urogenital implants: Secondary | ICD-10-CM | POA: Diagnosis present

## 2014-09-23 DIAGNOSIS — Z79891 Long term (current) use of opiate analgesic: Secondary | ICD-10-CM | POA: Diagnosis not present

## 2014-09-23 DIAGNOSIS — N390 Urinary tract infection, site not specified: Secondary | ICD-10-CM | POA: Diagnosis present

## 2014-09-23 DIAGNOSIS — I739 Peripheral vascular disease, unspecified: Secondary | ICD-10-CM | POA: Diagnosis present

## 2014-09-23 DIAGNOSIS — Z79899 Other long term (current) drug therapy: Secondary | ICD-10-CM

## 2014-09-23 DIAGNOSIS — E11311 Type 2 diabetes mellitus with unspecified diabetic retinopathy with macular edema: Secondary | ICD-10-CM | POA: Diagnosis not present

## 2014-09-23 LAB — COMPREHENSIVE METABOLIC PANEL
ALK PHOS: 89 U/L (ref 38–126)
ALT: 12 U/L — ABNORMAL LOW (ref 14–54)
AST: 20 U/L (ref 15–41)
Albumin: 3.4 g/dL — ABNORMAL LOW (ref 3.5–5.0)
Anion gap: 11 (ref 5–15)
BUN: 17 mg/dL (ref 6–20)
CO2: 27 mmol/L (ref 22–32)
Calcium: 9.8 mg/dL (ref 8.9–10.3)
Chloride: 96 mmol/L — ABNORMAL LOW (ref 101–111)
Creatinine, Ser: 1.59 mg/dL — ABNORMAL HIGH (ref 0.44–1.00)
GFR, EST AFRICAN AMERICAN: 37 mL/min — AB (ref >60)
GFR, EST NON AFRICAN AMERICAN: 32 mL/min — AB (ref >60)
Glucose, Bld: 177 mg/dL — ABNORMAL HIGH (ref 65–99)
Potassium: 4.1 mmol/L (ref 3.5–5.1)
Sodium: 134 mmol/L — ABNORMAL LOW (ref 135–145)
Total Bilirubin: 0.6 mg/dL (ref 0.3–1.2)
Total Protein: 8.8 g/dL — ABNORMAL HIGH (ref 6.5–8.1)

## 2014-09-23 LAB — I-STAT CHEM 8, ED
BUN: 21 mg/dL — AB (ref 6–20)
Calcium, Ion: 1.18 mmol/L (ref 1.13–1.30)
Chloride: 97 mmol/L — ABNORMAL LOW (ref 101–111)
Creatinine, Ser: 1.5 mg/dL — ABNORMAL HIGH (ref 0.44–1.00)
Glucose, Bld: 232 mg/dL — ABNORMAL HIGH (ref 65–99)
HCT: 37 % (ref 36.0–46.0)
Hemoglobin: 12.6 g/dL (ref 12.0–15.0)
POTASSIUM: 4.1 mmol/L (ref 3.5–5.1)
Sodium: 134 mmol/L — ABNORMAL LOW (ref 135–145)
TCO2: 27 mmol/L (ref 0–100)

## 2014-09-23 LAB — CBC
HCT: 36.8 % (ref 36.0–46.0)
Hemoglobin: 11.9 g/dL — ABNORMAL LOW (ref 12.0–15.0)
MCH: 27.7 pg (ref 26.0–34.0)
MCHC: 32.3 g/dL (ref 30.0–36.0)
MCV: 85.8 fL (ref 78.0–100.0)
Platelets: 484 10*3/uL — ABNORMAL HIGH (ref 150–400)
RBC: 4.29 MIL/uL (ref 3.87–5.11)
RDW: 15 % (ref 11.5–15.5)
WBC: 6.8 10*3/uL (ref 4.0–10.5)

## 2014-09-23 LAB — LIPASE, BLOOD: Lipase: 99 U/L — ABNORMAL HIGH (ref 22–51)

## 2014-09-23 LAB — URINALYSIS, ROUTINE W REFLEX MICROSCOPIC
Bilirubin Urine: NEGATIVE
Glucose, UA: NEGATIVE mg/dL
KETONES UR: 15 mg/dL — AB
NITRITE: POSITIVE — AB
PROTEIN: 100 mg/dL — AB
Specific Gravity, Urine: 1.015 (ref 1.005–1.030)
Urobilinogen, UA: 0.2 mg/dL (ref 0.0–1.0)
pH: 6.5 (ref 5.0–8.0)

## 2014-09-23 LAB — GLUCOSE, CAPILLARY
GLUCOSE-CAPILLARY: 80 mg/dL (ref 65–99)
Glucose-Capillary: 114 mg/dL — ABNORMAL HIGH (ref 65–99)

## 2014-09-23 LAB — URINE MICROSCOPIC-ADD ON

## 2014-09-23 MED ORDER — SENNOSIDES-DOCUSATE SODIUM 8.6-50 MG PO TABS
1.0000 | ORAL_TABLET | Freq: Every evening | ORAL | Status: DC | PRN
Start: 2014-09-23 — End: 2014-09-25

## 2014-09-23 MED ORDER — ACETAMINOPHEN 650 MG RE SUPP: 650.0000 mg | Freq: Four times a day (QID) | RECTAL | Status: DC | PRN

## 2014-09-23 MED ORDER — PIPERACILLIN-TAZOBACTAM 3.375 G IVPB 30 MIN
3.3750 g | Freq: Once | INTRAVENOUS | Status: AC
  Administered 2014-09-23: 3.375 g via INTRAVENOUS
  Filled 2014-09-23: qty 50

## 2014-09-23 MED ORDER — PIPERACILLIN-TAZOBACTAM 3.375 G IVPB
3.3750 g | Freq: Three times a day (TID) | INTRAVENOUS | Status: DC
  Administered 2014-09-23 – 2014-09-25 (×5): 3.375 g via INTRAVENOUS
  Filled 2014-09-23 (×9): qty 50

## 2014-09-23 MED ORDER — OXYCODONE-ACETAMINOPHEN 5-325 MG PO TABS
1.0000 | ORAL_TABLET | Freq: Four times a day (QID) | ORAL | Status: DC | PRN
  Administered 2014-09-23 – 2014-09-24 (×3): 1 via ORAL
  Filled 2014-09-23 (×3): qty 1

## 2014-09-23 MED ORDER — DOCUSATE SODIUM 100 MG PO CAPS: 100.0000 mg | ORAL_CAPSULE | Freq: Every day | ORAL | Status: DC | PRN

## 2014-09-23 MED ORDER — MORPHINE SULFATE 4 MG/ML IJ SOLN
4.0000 mg | Freq: Once | INTRAMUSCULAR | Status: AC
  Administered 2014-09-23: 4 mg via INTRAVENOUS
  Filled 2014-09-23: qty 1

## 2014-09-23 MED ORDER — ONDANSETRON HCL 4 MG PO TABS: 4.0000 mg | ORAL_TABLET | Freq: Four times a day (QID) | ORAL | Status: DC | PRN

## 2014-09-23 MED ORDER — ONDANSETRON HCL 4 MG/2ML IJ SOLN: 4.0000 mg | Freq: Once | INTRAMUSCULAR | Status: DC | PRN

## 2014-09-23 MED ORDER — SODIUM CHLORIDE 0.9 % IV BOLUS (SEPSIS)
1000.0000 mL | Freq: Once | INTRAVENOUS | Status: AC
  Administered 2014-09-23: 1000 mL via INTRAVENOUS

## 2014-09-23 MED ORDER — INSULIN ASPART 100 UNIT/ML ~~LOC~~ SOLN: 0.0000 [IU] | Freq: Every day | SUBCUTANEOUS | Status: DC

## 2014-09-23 MED ORDER — AMLODIPINE BESYLATE 5 MG PO TABS
5.0000 mg | ORAL_TABLET | Freq: Every morning | ORAL | Status: DC
  Administered 2014-09-24 – 2014-09-25 (×2): 5 mg via ORAL
  Filled 2014-09-23 (×2): qty 1

## 2014-09-23 MED ORDER — ACETAMINOPHEN 325 MG PO TABS: 650.0000 mg | ORAL_TABLET | Freq: Four times a day (QID) | ORAL | Status: DC | PRN

## 2014-09-23 MED ORDER — ZOLPIDEM TARTRATE 5 MG PO TABS: 5.0000 mg | ORAL_TABLET | Freq: Every evening | ORAL | Status: DC | PRN

## 2014-09-23 MED ORDER — ONDANSETRON HCL 4 MG/2ML IJ SOLN
4.0000 mg | Freq: Once | INTRAMUSCULAR | Status: AC
  Administered 2014-09-23: 4 mg via INTRAVENOUS
  Filled 2014-09-23: qty 2

## 2014-09-23 MED ORDER — ALUM & MAG HYDROXIDE-SIMETH 200-200-20 MG/5ML PO SUSP: 30.0000 mL | Freq: Four times a day (QID) | ORAL | Status: DC | PRN

## 2014-09-23 MED ORDER — SODIUM CHLORIDE 0.9 % IV SOLN
INTRAVENOUS | Status: DC
  Administered 2014-09-23 – 2014-09-24 (×2): via INTRAVENOUS

## 2014-09-23 MED ORDER — INSULIN GLARGINE 100 UNIT/ML ~~LOC~~ SOLN
15.0000 [IU] | Freq: Every day | SUBCUTANEOUS | Status: DC
  Administered 2014-09-23 – 2014-09-24 (×2): 15 [IU] via SUBCUTANEOUS
  Filled 2014-09-23 (×3): qty 0.15

## 2014-09-23 MED ORDER — ENOXAPARIN SODIUM 40 MG/0.4ML ~~LOC~~ SOLN
40.0000 mg | SUBCUTANEOUS | Status: DC
  Administered 2014-09-23 – 2014-09-24 (×2): 40 mg via SUBCUTANEOUS
  Filled 2014-09-23 (×2): qty 0.4

## 2014-09-23 MED ORDER — INSULIN ASPART 100 UNIT/ML ~~LOC~~ SOLN
0.0000 [IU] | Freq: Three times a day (TID) | SUBCUTANEOUS | Status: DC
  Administered 2014-09-24 (×2): 2 [IU] via SUBCUTANEOUS

## 2014-09-23 MED ORDER — ONDANSETRON HCL 4 MG/2ML IJ SOLN
4.0000 mg | Freq: Four times a day (QID) | INTRAMUSCULAR | Status: DC | PRN
  Administered 2014-09-23: 4 mg via INTRAVENOUS
  Filled 2014-09-23: qty 2

## 2014-09-23 NOTE — Progress Notes (Signed)
Utilization Review completed. Mihailo Sage RN BSN CM 

## 2014-09-23 NOTE — ED Notes (Signed)
Attempted report 

## 2014-09-23 NOTE — ED Notes (Signed)
Pt complaining of abdominal pain that started yesterday. Pt reports N/V/D. Pt denies fever or chills. Pt is A/O. Pt is tender upon palpation.

## 2014-09-23 NOTE — Progress Notes (Signed)
ANTIBIOTIC CONSULT NOTE - INITIAL  Pharmacy Consult:  Zosyn Indication:  Complicated UTI  No Known Allergies  Patient Measurements: Height = 65 inches Weight = 65.3 kg  Vital Signs: Temp: 99.3 F (37.4 C) (07/26 1147) Temp Source: Oral (07/26 1147) BP: 143/61 mmHg (07/26 1200) Pulse Rate: 95 (07/26 1200)  Labs:  Recent Labs  09/23/14 1009 09/23/14 1019  WBC 6.8  --   HGB 11.9* 12.6  PLT 484*  --   CREATININE 1.59* 1.50*   Estimated Creatinine Clearance: 31 mL/min (by C-G formula based on Cr of 1.5). No results for input(s): VANCOTROUGH, VANCOPEAK, VANCORANDOM, GENTTROUGH, GENTPEAK, GENTRANDOM, TOBRATROUGH, TOBRAPEAK, TOBRARND, AMIKACINPEAK, AMIKACINTROU, AMIKACIN in the last 72 hours.   Microbiology: Recent Results (from the past 720 hour(s))  Urine culture     Status: None   Collection Time: 08/24/14  8:20 PM  Result Value Ref Range Status   Specimen Description URINE, CLEAN CATCH  Final   Special Requests Normal  Final   Culture   Final    8,000 COLONIES/mL INSIGNIFICANT GROWTH Performed at [facility]    Report Status 08/26/2014 FINAL  Final  Wet prep, genital     Status: None   Collection Time: 08/24/14  8:50 PM  Result Value Ref Range Status   Yeast Wet Prep HPF POC NONE SEEN NONE SEEN Final   Trich, Wet Prep NONE SEEN NONE SEEN Final   Clue Cells Wet Prep HPF POC NONE SEEN NONE SEEN Final   WBC, Wet Prep HPF POC NONE SEEN NONE SEEN Final  Urine culture     Status: None   Collection Time: 09/05/14 11:33 AM  Result Value Ref Range Status   Specimen Description URINE, CATHETERIZED CYSTOCOPE  Final   Special Requests NONE  Final   Culture   Final    >=100,000 COLONIES/mL YEAST Performed at [facility]    Report Status 09/07/2014 FINAL  Final  Culture, blood (routine x 2)     Status: None   Collection Time: 09/05/14  5:30 PM  Result Value Ref Range Status   Specimen Description BLOOD LEFT HAND  Final   Special Requests BOTTLES DRAWN  AEROBIC ONLY 4CC  Final   Culture  Setup Time   Final    YEAST AEROBIC BOTTLE ONLY CRITICAL RESULT CALLED TO, READ BACK BY AND VERIFIED WITHTillman Sers RN [phone] GREEN R    Culture CANDIDA KRUSEI Performed at [facility]   Final   Report Status 09/09/2014 FINAL  Final  Culture, blood (routine x 2)     Status: None   Collection Time: 09/05/14  5:40 PM  Result Value Ref Range Status   Specimen Description BLOOD LEFT ARM  Final   Special Requests BOTTLES DRAWN AEROBIC AND ANAEROBIC 10CC  Final   Culture  Setup Time   Final    YEAST AEROBIC BOTTLE ONLY CRITICAL RESULT CALLED TO, READ BACK BY AND VERIFIED WITH: Loyal Buba AT 2325 09/06/14 BY RGREEN    Culture CANDIDA KRUSEI Performed at [facility]   Final   Report Status 09/09/2014 FINAL  Final  Culture, blood (routine x 2)     Status: None   Collection Time: 09/08/14  5:20 AM  Result Value Ref Range Status   Specimen Description BLOOD RIGHT ARM  Final   Special Requests BOTTLES DRAWN AEROBIC ONLY 0.5CC  Final   Culture   Final    NO GROWTH 5 DAYS Performed at [facility]  Report Status 09/13/2014 FINAL  Final  Culture, blood (routine x 2)     Status: None   Collection Time: 09/08/14  5:25 AM  Result Value Ref Range Status   Specimen Description BLOOD RIGHT HAND  Final   Special Requests BOTTLES DRAWN AEROBIC ONLY [place]  Final   Culture   Final    NO GROWTH 5 DAYS Performed at [facility]    Report Status 09/13/2014 FINAL  Final    Medical History: Past Medical History  Diagnosis Date  . Blood transfusion without reported diagnosis years ago  . Diabetes mellitus without complication   . Hypertension   . UTI (lower urinary tract infection)   . CHF (congestive heart failure) years ago    no current cardiologist, pt does not remember having chf  . History of kidney stones     x 2 in past      Assessment: 80 YOF recently hospitalized returned today with abdominal pain.  Pharmacy  consulted to initiate Zosyn for UTI.  Noted history of infected kidney stone and left kidney stent placement.   Goal of Therapy:  Resolution of infection   Plan:  - Zosyn 3.375gm IV Q8H, 4 hr infusion - Monitor renal fxn, clinical progress   Zandrea Kenealy D. Mina Marble, PharmD, BCPS Pager:  [phone] 09/23/2014, 12:35 PM

## 2014-09-23 NOTE — H&P (Addendum)
History and Physical  Ruth Douglas ZOX:[id number] DOB: 12/05/1940 DOA: 09/23/2014  Referring physician: Margarita Mail, PA-C, ED provider PCP: No PCP Per Patient   Chief Complaint: Abdominal pain  HPI: Ruth Douglas is a 74 y.o. female  With a history of diabetes, hypertension, kidney stones, with hospitalizations starting in 10-Aug-202016 for sepsis with candidemia.  She had a lithotripsy with left ureteroscopy and stent placement that same day secondary to sepsis. The patient has been on anti-fungals since her discharge until Saturday. However, she is continued to have some abdominal pain. She was also on nitrofurantoin for prophylaxis to prevent bladder infections. Over the past 48 hours, her left constant, nonradiating abdominal pain has increased. Her pain is worsened with movements and improved with rest. She has additional nausea, vomiting that occurred earlier this morning.   Review of Systems:   Pt denies any fevers, chills, diarrhea, constipation, chest pain, shortness of breath, headaches, blurred vision, weakness, lightheadedness, dizziness.  Review of systems are otherwise negative  Past Medical History  Diagnosis Date  . Blood transfusion without reported diagnosis years ago  . Diabetes mellitus without complication   . Hypertension   . UTI (lower urinary tract infection)   . CHF (congestive heart failure) years ago    no current cardiologist, pt does not remember having chf  . History of kidney stones     x 2 in past   Past Surgical History  Procedure Laterality Date  . Toe amputation    . Cesarean section    . Cystoscopy with stent placement  01/16/2012    Procedure: CYSTOSCOPY WITH STENT PLACEMENT;  Surgeon: Dutch Gray, MD;  Location: [facility];  Service: Urology;  Laterality: Left;  cysto, retrograde , ureteroscopy , stone extraction with laser lithotripsy, stent placement on left, bladder biopsies with fulgaration  . Holmium laser application  81/19/1478   Procedure: HOLMIUM LASER APPLICATION;  Surgeon: Dutch Gray, MD;  Location: [facility];  Service: Urology;  Laterality: Left;  . Amputation Left 06/01/2012    Procedure: LEFT FOOT TRANSMETATARSEL AMPUTATION;  Surgeon: Wylene Simmer, MD;  Location: [place];  Service: Orthopedics;  Laterality: Left;  . Achilles tendon surgery Left 06/01/2012    Procedure: PERCUTANEOUS TENDON ACHILLES LENGTHING;  Surgeon: Wylene Simmer, MD;  Location: [place];  Service: Orthopedics;  Laterality: Left;  . Amputation Right 06/20/2014    Procedure: AMPUTATION FOOT;  Surgeon: Newt Minion, MD;  Location: [place];  Service: Orthopedics;  Laterality: Right;  . Cystoscopy w/ ureteral stent placement Left 08/08/2014    Procedure: CYSTOSCOPY WITH RETROGRADE PYELOGRAM/URETERAL STENT PLACEMENT;  Surgeon: Festus Aloe, MD;  Location: [place];  Service: Urology;  Laterality: Left;  Marland Kitchen Eye surgery Bilateral     lens replacements for cataracts  . Cystoscopy with retrograde pyelogram, ureteroscopy and stent placement Left 09/05/2014    Procedure: CYSTO, LEFT RETROGRADE PYELOGRAM, LEFT URETEROSCOPY AND STENT PLACEMENT;  Surgeon: Festus Aloe, MD;  [facility];  Service: Urology;  Laterality: Left;  . Holmium laser application Left 04/09/5619    Procedure: LEFT HOLMIUM LASER APPLICATION;  Surgeon: Festus Aloe, MD;  Location: [facility];  Service: Urology;  Laterality: Left;   Social History:  reports that she has never smoked. She has never used smokeless tobacco. She reports that she does not drink alcohol or use illicit drugs. Patient lives at home & is able to participate in activities of daily living  No Known Allergies  Family History  Problem Relation Age of Onset  . Diabetes Mellitus  II Mother   . Diabetes Mellitus II Father   . Lung cancer Sister   . Kidney cancer Brother       Prior to Admission medications   Medication Sig Start Date End Date Taking? Authorizing Provider  acetaminophen (TYLENOL) 500 MG tablet Take 1,000 mg  by mouth every 6 (six) hours as needed for mild pain (pain).    Yes Historical Provider, MD  amLODipine (NORVASC) 5 MG tablet Take 5 mg by mouth every morning.  06/24/14  Yes Historical Provider, MD  CRANBERRY PO Take 1 capsule by mouth every morning.    Yes Historical Provider, MD  docusate sodium (COLACE) 100 MG capsule Take 1 capsule (100 mg total) by mouth daily as needed for mild constipation. 08/24/14  Yes Pamella Pert, MD  ferrous sulfate 325 (65 FE) MG tablet Take 1 tablet (325 mg total) by mouth 2 (two) times daily with a meal. 06/04/12  Yes Belkys A Regalado, MD  LANTUS SOLOSTAR 100 UNIT/ML Solostar Pen inject 15 units at bedtime 07/05/14  Yes Historical Provider, MD  nitrofurantoin, macrocrystal-monohydrate, (MACROBID) 100 MG capsule Take 1 capsule (100 mg total) by mouth at bedtime. 09/12/14  Yes Festus Aloe, MD  oxyCODONE-acetaminophen (PERCOCET) 5-325 MG per tablet Take 1 tablet by mouth every 6 (six) hours as needed for moderate pain. 09/12/14  Yes Festus Aloe, MD    Physical Exam: BP 140/58 mmHg  Pulse 94  Temp(Src) 99.3 F (37.4 C) (Oral)  Resp 16  SpO2 97%  General: Elderly Caucasian female. Awake and alert and oriented x3. No acute cardiopulmonary distress.  Eyes: Pupils equal, round, reactive to light. Extraocular muscles are intact. Sclerae anicteric and noninjected.  ENT:  Moist mucosal membranes. No mucosal lesions.   Neck: Neck supple without lymphadenopathy. No carotid bruits. No masses palpated.  Cardiovascular: Regular rate with normal S1-S2 sounds. No murmurs, rubs, gallops auscultated. No JVD.  Respiratory: Good respiratory effort with no wheezes, rales, rhonchi. Lungs clear to auscultation bilaterally.  Abdomen: Soft, tender left lower quadrant and suprapubically, nondistended. Active bowel sounds. No masses or hepatosplenomegaly  Skin: Dry, warm to touch. 2+ dorsalis pedis and radial pulses. Musculoskeletal: No calf or leg pain. All major joints not  erythematous nontender.  Psychiatric: Intact judgment and insight.  Neurologic: No focal neurological deficits. Cranial nerves II through XII are grossly intact.           Labs on Admission:  Basic Metabolic Panel:  Recent Labs Lab 09/23/14 1009 09/23/14 1019  NA 134* 134*  K 4.1 4.1  CL 96* 97*  CO2 27  --   GLUCOSE 177* 232*  BUN 17 21*  CREATININE 1.59* 1.50*  CALCIUM 9.8  --    Liver Function Tests:  Recent Labs Lab 09/23/14 1009  AST 20  ALT 12*  ALKPHOS 89  BILITOT 0.6  PROT 8.8*  ALBUMIN 3.4*    Recent Labs Lab 09/23/14 1009  LIPASE 99*   No results for input(s): AMMONIA in the last 168 hours. CBC:  Recent Labs Lab 09/23/14 1009 09/23/14 1019  WBC 6.8  --   HGB 11.9* 12.6  HCT 36.8 37.0  MCV 85.8  --   PLT 484*  --    Cardiac Enzymes: No results for input(s): CKTOTAL, CKMB, CKMBINDEX, TROPONINI in the last 168 hours.  BNP (last 3 results) No results for input(s): BNP in the last 8760 hours.  ProBNP (last 3 results) No results for input(s): PROBNP in the last 8760 hours.  CBG:  Recent Labs Lab 09/18/14 0817  GLUCAP 202*    Radiological Exams on Admission: No results found.   Assessment/Plan Present on Admission:  . UTI (lower urinary tract infection) . Acute-on-chronic kidney injury  This patient was discussed with the ED physician, including pertinent vitals, physical exam findings, labs, and imaging.  We also discussed care given by the ED provider.  #1 UTI   Admit  We'll cover for possible nosocomial infection  Urology consulted by phone, who indicated no need to remove stent. Continue with nosocomial infection coverage with Zosyn  Urine culture sent from emergency department #2 acute on chronic kidney injury  We'll give IV fluid bolus in the emergency department  Continue IV fluids 125 ML's per hour  Metabolic panel in the morning #3 diabetes  Sliding scale insulin to assist with coverage  DVT prophylaxis:  Lovenox  Consultants: None  Code Status: DO NOT RESUSCITATE  Family Communication: Daughter in the room   Disposition Plan: Pending   Truett Mainland, DO [facility] Pager [phone]

## 2014-09-23 NOTE — Progress Notes (Signed)
Utilization Review completed. Marcellus Pulliam RN BSN CM 

## 2014-09-23 NOTE — Progress Notes (Signed)
Ruth Douglas [id number] Admission Data: 09/23/2014 2:47 PM Attending Provider: Truett Mainland, DO  PCP:No PCP Per Patient Consults/ Treatment Team:    Ruth Douglas is a 74 y.o. female patient admitted from ED awake, alert  & orientated  X 3,  DNR, VSS - Blood pressure 130/68, pulse 90, temperature 98.6 F (37 C), temperature source Oral, resp. rate 17, SpO2 98 %., no c/o shortness of breath, no c/o chest pain, no distress noted.    IV site WDL: Right arm, running Normal Saline.   Allergies:  No Known Allergies   Past Medical History  Diagnosis Date  . Blood transfusion without reported diagnosis years ago  . Diabetes mellitus without complication   . Hypertension   . UTI (lower urinary tract infection)   . CHF (congestive heart failure) years ago    no current cardiologist, pt does not remember having chf  . History of kidney stones     x 2 in past     Pt orientation to unit, room and routine. Information packet given to patient/family and safety video watched.  Admission INP armband ID verified with patient/family, and in place. SR up x 2, fall risk assessment complete with Patient and family verbalizing understanding of risks associated with falls. Pt verbalizes an understanding of how to use the call bell and to call for help before getting out of bed.  Skin, clean-dry- intact without evidence of bruising, or skin tears.   No evidence of skin break down noted on exam.     Will cont to monitor and assist as needed.  Dayle Points, RN 09/23/2014 2:47 PM

## 2014-09-23 NOTE — Progress Notes (Signed)
Attempted report 

## 2014-09-23 NOTE — ED Provider Notes (Signed)
CSN: [id number]     Arrival date & time 09/23/14  0944 History   First MD Initiated Contact with Patient 09/23/14 1010     Chief Complaint  Patient presents with  . Abdominal Pain     (Consider location/radiation/quality/duration/timing/severity/associated sxs/prior Treatment) HPI Comments: Ruth Douglas, 74 y/o female with recent hospitalization of nephrolithiasis and candidemia and history of CKD, HTN, and DM2 presents with nausea, vomiting, and abdominal pain. Patient was recently admitted for urosepsis due to an infected kidney stone and given a left kidney stent. She was discharged 09/16/14. She also complains of dysuria, bladder incontinence, and fatigue. The abdominal pain began 4 months ago. She began vomiting at 3 am this morning, has vomited 7-8 times, and has started dry heaving. Her daughter in law has noted that she is having increased difficulty ambulating because of increased weakness. She has noted a "bulge" and has been told she has uterine prolapse. She had 4 BMs yesterday. Patient denies fevers, chills, night sweats, headaches, bloody emesis, hematuria, blood in stool, constipation.    Patient is a 74 y.o. female presenting with abdominal pain. The history is provided by the patient and a relative.  Abdominal Pain Pain location:  Suprapubic, LLQ and L flank Pain quality: burning   Pain severity:  Moderate Duration:  16 weeks Chronicity:  Chronic Worsened by:  Movement and urination Associated symptoms: dysuria, fatigue, nausea and vomiting   Associated symptoms: no chest pain, no chills, no constipation, no diarrhea, no fever, no hematemesis, no hematochezia, no hematuria, no melena and no shortness of breath   Vomiting:    Number of occurrences:  7-8   Severity:  Moderate   Duration:  8 hours   Past Medical History  Diagnosis Date  . Blood transfusion without reported diagnosis years ago  . Diabetes mellitus without complication   . Hypertension   . UTI (lower  urinary tract infection)   . CHF (congestive heart failure) years ago    no current cardiologist, pt does not remember having chf  . History of kidney stones     x 2 in past   Past Surgical History  Procedure Laterality Date  . Toe amputation    . Cesarean section    . Cystoscopy with stent placement  01/16/2012    Procedure: CYSTOSCOPY WITH STENT PLACEMENT;  Surgeon: Dutch Gray, MD;  [facility];  Service: Urology;  Laterality: Left;  cysto, retrograde , ureteroscopy , stone extraction with laser lithotripsy, stent placement on left, bladder biopsies with fulgaration  . Holmium laser application  00/86/7619    Procedure: HOLMIUM LASER APPLICATION;  Surgeon: Dutch Gray, MD;  [facility];  Service: Urology;  Laterality: Left;  . Amputation Left 06/01/2012    Procedure: LEFT FOOT TRANSMETATARSEL AMPUTATION;  Surgeon: Wylene Simmer, MD;  Location: [place];  Service: Orthopedics;  Laterality: Left;  . Achilles tendon surgery Left 06/01/2012    Procedure: PERCUTANEOUS TENDON ACHILLES LENGTHING;  Surgeon: Wylene Simmer, MD;  Location: [place];  Service: Orthopedics;  Laterality: Left;  . Amputation Right 06/20/2014    Procedure: AMPUTATION FOOT;  Surgeon: Newt Minion, MD;  Location: [place];  Service: Orthopedics;  Laterality: Right;  . Cystoscopy w/ ureteral stent placement Left 08/08/2014    Procedure: CYSTOSCOPY WITH RETROGRADE PYELOGRAM/URETERAL STENT PLACEMENT;  Surgeon: Festus Aloe, MD;  Location: [place];  Service: Urology;  Laterality: Left;  Marland Kitchen Eye surgery Bilateral     lens replacements for cataracts  . Cystoscopy with retrograde pyelogram,  ureteroscopy and stent placement Left 09/05/2014    Procedure: CYSTO, LEFT RETROGRADE PYELOGRAM, LEFT URETEROSCOPY AND STENT PLACEMENT;  Surgeon: Festus Aloe, MD;  [facility];  Service: Urology;  Laterality: Left;  . Holmium laser application Left 0/0/3704    Procedure: LEFT HOLMIUM LASER APPLICATION;  Surgeon: Festus Aloe, MD;   [facility];  Service: Urology;  Laterality: Left;   Family History  Problem Relation Age of Onset  . Diabetes Mellitus II Mother   . Diabetes Mellitus II Father   . Lung cancer Sister   . Kidney cancer Brother    History  Substance Use Topics  . Smoking status: Never Smoker   . Smokeless tobacco: Never Used  . Alcohol Use: No   OB History    No data available     Review of Systems  Constitutional: Positive for fatigue. Negative for fever and chills.  HENT: Negative for trouble swallowing.   Respiratory: Negative for shortness of breath.   Cardiovascular: Negative for chest pain.  Gastrointestinal: Positive for nausea, vomiting and abdominal pain. Negative for diarrhea, constipation, melena, hematochezia and hematemesis.  Genitourinary: Positive for dysuria. Negative for hematuria.  Musculoskeletal: Negative for myalgias and arthralgias.  Skin: Negative for rash.  Neurological: Positive for weakness. Negative for numbness.  All other systems reviewed and are negative.     Allergies  Review of patient's allergies indicates no known allergies.  Home Medications   Prior to Admission medications   Medication Sig Start Date End Date Taking? Authorizing Provider  acetaminophen (TYLENOL) 500 MG tablet Take 1,000 mg by mouth every 6 (six) hours as needed for mild pain (pain).    Yes Historical Provider, MD  amLODipine (NORVASC) 5 MG tablet Take 5 mg by mouth every morning.  06/24/14  Yes Historical Provider, MD  anidulafungin 100 mg in sodium chloride 0.9 % 100 mL Inject 100 mg into the vein daily. 09/12/14  Yes Festus Aloe, MD  CRANBERRY PO Take 1 capsule by mouth every morning.    Yes Historical Provider, MD  docusate sodium (COLACE) 100 MG capsule Take 1 capsule (100 mg total) by mouth daily as needed for mild constipation. 08/24/14  Yes Pamella Pert, MD  ferrous sulfate 325 (65 FE) MG tablet Take 1 tablet (325 mg total) by mouth 2 (two) times daily with a meal.  06/04/12  Yes Belkys A Regalado, MD  LANTUS SOLOSTAR 100 UNIT/ML Solostar Pen inject 15 units at bedtime 07/05/14  Yes Historical Provider, MD  nitrofurantoin, macrocrystal-monohydrate, (MACROBID) 100 MG capsule Take 1 capsule (100 mg total) by mouth at bedtime. 09/12/14  Yes Festus Aloe, MD  oxyCODONE-acetaminophen (PERCOCET) 5-325 MG per tablet Take 1 tablet by mouth every 6 (six) hours as needed for moderate pain. 09/12/14  Yes Festus Aloe, MD   BP 153/71 mmHg  Pulse 99  Temp(Src) 98.9 F (37.2 C) (Oral)  Resp 17  SpO2 95% Physical Exam  Constitutional: She is oriented to person, place, and time. She appears well-developed and well-nourished. No distress.  Cardiovascular: Regular rhythm and normal heart sounds.   Tachycardic at 103 bpm  Pulmonary/Chest: Effort normal and breath sounds normal.  Abdominal: Soft. Bowel sounds are normal. She exhibits no distension. There is tenderness. There is no guarding.  Genitourinary:  Prolapse visualized on exam table. Increased visualization with baring down.  Musculoskeletal: Normal range of motion. She exhibits no edema or tenderness.  Neurological: She is alert and oriented to person, place, and time.  Skin: She is not diaphoretic.  Psychiatric: She has a normal mood and affect.    ED Course  Procedures (including critical care time) Labs Review Labs Reviewed  CBC - Abnormal; Notable for the following:    Hemoglobin 11.9 (*)    Platelets 484 (*)    All other components within normal limits  I-STAT CHEM 8, ED - Abnormal; Notable for the following:    Sodium 134 (*)    Chloride 97 (*)    BUN 21 (*)    Creatinine, Ser 1.50 (*)    Glucose, Bld 232 (*)    All other components within normal limits  LIPASE, BLOOD  COMPREHENSIVE METABOLIC PANEL  URINALYSIS, ROUTINE W REFLEX MICROSCOPIC (NOT AT [facility])    Imaging Review No results found.   EKG Interpretation None      MDM   Final diagnoses:  UTI (lower urinary tract  infection)  Dehydration    Patient labs show elevated BUN and creatinine from baseline, suggestive of dehydration. This is also reflected in her hemoglobin and platelet count, which appear to be elevated from volume contraction. The patient does have a new bacterial urinary tract infection. The patient has been hospitalized in the last 90 days. She'll be treated for hospital-acquired infection. She is currently taking Macrobid one tablet at night for prophylaxis along with antifungal medication. Her urine has been sent for culture Patient will be admitted by the hospitalist service.     12:54 PM BP 140/58 mmHg  Pulse 94  Temp(Src) 99.3 F (37.4 C) (Oral)  Resp 16  SpO2 97% SPoke with Dr. McDiarmid who will consult on the patient if requested.   Margarita Mail, PA-C 09/23/14 1557  Charlesetta Shanks, MD 10/06/14 [phone]

## 2014-09-24 ENCOUNTER — Encounter (HOSPITAL_COMMUNITY): Payer: Self-pay | Admitting: Radiology

## 2014-09-24 ENCOUNTER — Inpatient Hospital Stay (HOSPITAL_COMMUNITY): Payer: Medicare FFS

## 2014-09-24 ENCOUNTER — Inpatient Hospital Stay: Payer: Medicare PPO | Admitting: Infectious Diseases

## 2014-09-24 DIAGNOSIS — N390 Urinary tract infection, site not specified: Principal | ICD-10-CM

## 2014-09-24 DIAGNOSIS — I739 Peripheral vascular disease, unspecified: Secondary | ICD-10-CM

## 2014-09-24 DIAGNOSIS — E11311 Type 2 diabetes mellitus with unspecified diabetic retinopathy with macular edema: Secondary | ICD-10-CM

## 2014-09-24 DIAGNOSIS — N189 Chronic kidney disease, unspecified: Secondary | ICD-10-CM

## 2014-09-24 DIAGNOSIS — N179 Acute kidney failure, unspecified: Secondary | ICD-10-CM

## 2014-09-24 LAB — BASIC METABOLIC PANEL
ANION GAP: 6 (ref 5–15)
BUN: 14 mg/dL (ref 6–20)
CHLORIDE: 101 mmol/L (ref 101–111)
CO2: 28 mmol/L (ref 22–32)
CREATININE: 1.58 mg/dL — AB (ref 0.44–1.00)
Calcium: 9 mg/dL (ref 8.9–10.3)
GFR calc non Af Amer: 32 mL/min — ABNORMAL LOW (ref >60)
GFR, EST AFRICAN AMERICAN: 37 mL/min — AB (ref >60)
Glucose, Bld: 99 mg/dL (ref 65–99)
Potassium: 4 mmol/L (ref 3.5–5.1)
Sodium: 135 mmol/L (ref 135–145)

## 2014-09-24 LAB — GLUCOSE, CAPILLARY
GLUCOSE-CAPILLARY: 98 mg/dL (ref 65–99)
GLUCOSE-CAPILLARY: 121 mg/dL — AB (ref 65–99)
GLUCOSE-CAPILLARY: 160 mg/dL — AB (ref 65–99)
Glucose-Capillary: 148 mg/dL — ABNORMAL HIGH (ref 65–99)

## 2014-09-24 NOTE — Care Management Note (Addendum)
Case Management Note  Patient Details  Name: Jashayla Glatfelter MRN: [id number] Date of Birth: 1940/04/26  Subjective/Objective:    NCM spoke with patient, she states she lives with son and his girlfriend and she  Was indep and recently started using a cane at home.  She states she does not need any HH services. Patient states she is supposed to be getting some special made shoes also.               Action/Plan:   Expected Discharge Date:                  Expected Discharge Plan:  Home/Self Care  In-House Referral:     Discharge planning Services  CM Consult  Post Acute Care Choice:    Choice offered to:     DME Arranged:    DME Agency:     HH Arranged:    HH Agency:     Status of Service:  In process, will continue to follow  Medicare Important Message Given:    Date Medicare IM Given:    Medicare IM give by:    Date Additional Medicare IM Given:    Additional Medicare Important Message give by:     If discussed at [place] of Stay Meetings, dates discussed:    Additional Comments:  Zenon Mayo, RN 09/24/2014, 1:48 PM

## 2014-09-24 NOTE — Progress Notes (Signed)
Patient Demographics:    Ruth Douglas, is a 74 y.o. female, DOB - 1940/05/13, QPY:[id number]  Admit date - 09/23/2014   Admitting Physician Truett Mainland, DO  Outpatient Primary MD for the patient is No PCP Per Patient  LOS - 1   Chief Complaint  Patient presents with  . Abdominal Pain        Subjective:    [name] today has, No headache, No chest pain, No abdominal pain - No Nausea, No new weakness tingling or numbness, No Cough - SOB. No suprapubic discomfort.   Assessment  & Plan :     1. Nausea with suprapubic abdominal pain. Has history of kidney stones and fungemia, recently completed treatment for fungemia. Also has history of uterine prolapse. This morning she is symptom-free. UA did suggest UTI and she is on appropriate anti-biotics. Will obtain CT scan abdomen pelvis to rule out any obstructing stones. Have patient urology 2 as patient well known to Dr. Junious Silk with multiple urological procedures done recently.   2. DM type II on Lantus and sliding scale will continue to monitor.  Lab Results  Component Value Date   HGBA1C 11.9* 06/19/2014    CBG (last 3)   Recent Labs  09/23/14 1713 09/23/14 2150 09/24/14 0812  GLUCAP 80 114* 98     3. PAD. Recent right foot transmetatarsal amputation done by Dr. Sharol Given. Stable. Supportive care.   4. Chronic disease. 4. Creatinine baseline 1.5   5.HTN - On Norvasc stable continue.   6.AOCD - Stable.       Code Status : Full  Family Communication  : None Present  Disposition Plan  : Home 1-2 days  Consults  : have paged Urology  Procedures  : CT Abd - pelvis Ordered  DVT Prophylaxis  :  Lovenox   Lab Results  Component Value Date   PLT 484* 09/23/2014    Inpatient Medications  Scheduled Meds: .  amLODipine  5 mg Oral q morning - 10a  . enoxaparin (LOVENOX) injection  40 mg Subcutaneous Q24H  . insulin aspart  0-15 Units Subcutaneous TID WC  . insulin aspart  0-5 Units Subcutaneous QHS  . insulin glargine  15 Units Subcutaneous Q2200  . piperacillin-tazobactam (ZOSYN)  IV  3.375 g Intravenous Q8H   Continuous Infusions: . sodium chloride 125 mL/hr at 09/23/14 1433   PRN Meds:.acetaminophen **OR** acetaminophen, alum & mag hydroxide-simeth, docusate sodium, ondansetron **OR** ondansetron (ZOFRAN) IV, oxyCODONE-acetaminophen, senna-docusate, zolpidem  Antibiotics  :     Anti-infectives    Start     Dose/Rate Route Frequency Ordered Stop   09/23/14 2200  piperacillin-tazobactam (ZOSYN) IVPB 3.375 g     3.375 g 12.5 mL/hr over 240 Minutes Intravenous Every 8 hours 09/23/14 1231     09/23/14 1245  piperacillin-tazobactam (ZOSYN) IVPB 3.375 g     3.375 g 100 mL/hr over 30 Minutes Intravenous  Once 09/23/14 1231 09/23/14 1332        Objective:   Filed Vitals:   09/23/14 1432 09/23/14 2148 09/24/14 0500 09/24/14 0535  BP: 130/68 131/57  151/59  Pulse: 90 76  78  Temp: 98.6 F (37 C) 97.8 F (36.6 C)  97.7 F (36.5 C)  TempSrc: Oral Oral  Oral  Resp: 17 16  19   Height:   5\' 3"  (1.6 m)   Weight:   63.2 kg (139 lb 5.3 oz)   SpO2: 98% 97%  94%    Wt Readings from Last 3 Encounters:  09/24/14 63.2 kg (139 lb 5.3 oz)  09/17/14 65.318 kg (144 lb)  09/05/14 65.5 kg (144 lb 6.4 oz)     Intake/Output Summary (Last 24 hours) at 09/24/14 1128 Last data filed at 09/24/14 0900  Gross per 24 hour  Intake 767.08 ml  Output    300 ml  Net 467.08 ml     Physical Exam  Awake Alert, Oriented X 3, No new F.N deficits, Normal affect .AT,PERRAL Supple Neck,No JVD, No cervical lymphadenopathy appriciated.  Symmetrical Chest wall movement, Good air movement bilaterally, CTAB RRR,No Gallops,Rubs or new Murmurs, No Parasternal Heave +ve B.Sounds, Abd Soft, No tenderness, No  organomegaly appriciated, No rebound - guarding or rigidity. No Cyanosis, Clubbing or edema, No new Rash or bruise       Data Review:   Micro Results No results found for this or any previous visit (from the past 240 hour(s)).  Radiology Reports Ct Abdomen Pelvis W Contrast  09/11/2014   CLINICAL DATA:  Fever of unknown origin  EXAM: CT ABDOMEN AND PELVIS WITH CONTRAST  TECHNIQUE: Multidetector CT imaging of the abdomen and pelvis was performed using the standard protocol following bolus administration of intravenous contrast.  CONTRAST:  168mL OMNIPAQUE IOHEXOL 300 MG/ML  SOLN  COMPARISON:  08/08/2014  FINDINGS: Lower chest: There are small bilateral pleural effusions. Overlying compressive type atelectasis is identified.  Hepatobiliary: No suspicious liver abnormality. The gallbladder appears normal. There is no biliary dilatation.  Pancreas: Normal appearance of the pancreas.  Spleen: The spleen is unremarkable.  Adrenals/Urinary Tract: The adrenal glands are both normal. Two cysts are noted within the upper pole of the right kidney with attenuation and enhancement characteristics compatible with Bosniak 1 type lesions. Subcapsular hematoma is identified involving the left kidney. This measures 6.8 x 3.0 x 8.2 cm. There is no gas within the subcapsular fluid collection. 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 Left-sided pelvocaliectasis noted. There is asymmetric enhancement of the mildly dilated left ureter. No ureteral calculi identified. Bladder wall thickening is again noted. No focal filling defects within the bladder noted. On the delayed images there is symmetric excretion of contrast material from both kidneys. The entire left ureter is opacified and appears patent.  Stomach/Bowel: The stomach is within normal limits. The small bowel loops have a normal course and caliber. No obstruction. Normal appearance of the colon. The appendix is visualized and  appears normal.  Vascular/Lymphatic: Calcified atherosclerotic disease involves the abdominal aorta. No aneurysm. No enlarged retroperitoneal or mesenteric adenopathy. No enlarged pelvic or inguinal lymph nodes.  Reproductive: The uterus and adnexal structures are unremarkable.  Other: There is a trace amount of free fluid within the dependent portion of the pelvis.  Musculoskeletal: The visualized bony structures are on unremarkable.  IMPRESSION: 1. Left-sided subcapsular fluid collection. Favor hematoma status post lithotripsy. In the setting of fever infected hematoma cannot be excluded. 2. Diffuse bladder wall thickening identified compatible with cystitis. 3. Right renal cyst. 4. Aortic atherosclerosis.   Electronically Signed   By: Kerby Moors M.D.   On: 09/11/2014 15:57     CBC  Recent Labs Lab 09/23/14 1009 09/23/14 1019  WBC 6.8  --   HGB 11.9*  12.6  HCT 36.8 37.0  PLT 484*  --   MCV 85.8  --   MCH 27.7  --   MCHC 32.3  --   RDW 15.0  --     Chemistries   Recent Labs Lab 09/23/14 1009 09/23/14 1019 09/24/14 0552  NA 134* 134* 135  K 4.1 4.1 4.0  CL 96* 97* 101  CO2 27  --  28  GLUCOSE 177* 232* 99  BUN 17 21* 14  CREATININE 1.59* 1.50* 1.58*  CALCIUM 9.8  --  9.0  AST 20  --   --   ALT 12*  --   --   ALKPHOS 89  --   --   BILITOT 0.6  --   --    ------------------------------------------------------------------------------------------------------------------ estimated creatinine clearance is 29.2 mL/min (by C-G formula based on Cr of 1.58). ------------------------------------------------------------------------------------------------------------------ No results for input(s): HGBA1C in the last 72 hours. ------------------------------------------------------------------------------------------------------------------ No results for input(s): CHOL, HDL, LDLCALC, TRIG, CHOLHDL, LDLDIRECT in the last 72  hours. ------------------------------------------------------------------------------------------------------------------ No results for input(s): TSH, T4TOTAL, T3FREE, THYROIDAB in the last 72 hours.  Invalid input(s): FREET3 ------------------------------------------------------------------------------------------------------------------ No results for input(s): VITAMINB12, FOLATE, FERRITIN, TIBC, IRON, RETICCTPCT in the last 72 hours.  Coagulation profile No results for input(s): INR, PROTIME in the last 168 hours.  No results for input(s): DDIMER in the last 72 hours.  Cardiac Enzymes No results for input(s): CKMB, TROPONINI, MYOGLOBIN in the last 168 hours.  Invalid input(s): CK ------------------------------------------------------------------------------------------------------------------ Invalid input(s): POCBNP   Time Spent in minutes   30   SINGH,PRASHANT K M.D on 09/24/2014 at 11:28 AM  Between 7am to 7pm - Pager - [phone]  After 7pm go to [email or web address] - password [facility]  [facility] -  Office  [phone]

## 2014-09-25 LAB — URINE CULTURE

## 2014-09-25 LAB — GLUCOSE, CAPILLARY
GLUCOSE-CAPILLARY: 88 mg/dL (ref 65–99)
Glucose-Capillary: 178 mg/dL — ABNORMAL HIGH (ref 65–99)

## 2014-09-25 MED ORDER — SACCHAROMYCES BOULARDII 250 MG PO CAPS: 250.0000 mg | ORAL_CAPSULE | Freq: Two times a day (BID) | ORAL | Status: DC

## 2014-09-25 MED ORDER — NITROFURANTOIN MONOHYD MACRO 100 MG PO CAPS: 100.0000 mg | ORAL_CAPSULE | Freq: Every day | ORAL | Status: DC

## 2014-09-25 MED ORDER — CEFPODOXIME PROXETIL 200 MG PO TABS: 200.0000 mg | ORAL_TABLET | Freq: Two times a day (BID) | ORAL | Status: DC

## 2014-09-25 NOTE — Discharge Instructions (Signed)
Follow with Primary MD and OB MD in 3-4 days   Get CBC, CMP, 2 view Chest X ray checked  by Primary MD next visit.    Activity: As tolerated with Full fall precautions use walker/cane & assistance as needed   Disposition Home    Diet: Heart Healthy Low Carb.  For Heart failure patients - Check your Weight same time everyday, if you gain over 2 pounds, or you develop in leg swelling, experience more shortness of breath or chest pain, call your Primary MD immediately. Follow Cardiac Low Salt Diet and 1.5 lit/day fluid restriction.   On your next visit with your primary care physician please Get Medicines reviewed and adjusted.   Please request your Prim.MD to go over all Hospital Tests and Procedure/Radiological results at the follow up, please get all Hospital records sent to your Prim MD by signing hospital release before you go home.   If you experience worsening of your admission symptoms, develop shortness of breath, life threatening emergency, suicidal or homicidal thoughts you must seek medical attention immediately by calling 911 or calling your MD immediately  if symptoms less severe.  You Must read complete instructions/literature along with all the possible adverse reactions/side effects for all the Medicines you take and that have been prescribed to you. Take any new Medicines after you have completely understood and accpet all the possible adverse reactions/side effects.   Do not drive, operating heavy machinery, perform activities at heights, swimming or participation in water activities or provide baby sitting services if your were admitted for syncope or siezures until you have seen by Primary MD or a Neurologist and advised to do so again.  Do not drive when taking Pain medications.    Do not take more than prescribed Pain, Sleep and Anxiety Medications  Special Instructions: If you have smoked or chewed Tobacco  in the last 2 yrs please stop smoking, stop any regular  Alcohol  and or any Recreational drug use.  Wear Seat belts while driving.   Please note  You were cared for by a hospitalist during your hospital stay. If you have any questions about your discharge medications or the care you received while you were in the hospital after you are discharged, you can call the unit and asked to speak with the hospitalist on call if the hospitalist that took care of you is not available. Once you are discharged, your primary care physician will handle any further medical issues. Please note that NO REFILLS for any discharge medications will be authorized once you are discharged, as it is imperative that you return to your primary care physician (or establish a relationship with a primary care physician if you do not have one) for your aftercare needs so that they can reassess your need for medications and monitor your lab values.

## 2014-09-25 NOTE — Discharge Summary (Signed)
Ruth Douglas, is a 74 y.o. female  DOB 1941-01-15  MRN [id number].  Admission date:  09/23/2014  Admitting Physician  Truett Mainland, DO  Discharge Date:  09/25/2014   Primary MD  No PCP Per Patient  Recommendations for primary care physician for things to follow:   Follow with your primary care physician within a week, follow final urine culture results. Get CBC, CMP checked  Follow with Dr. Junious Silk your urologist within 3-4 days follow final urine culture results with him as well.  You must follow with your OB physician in 7-10 days.   Admission Diagnosis  Dehydration [E86.0] UTI (lower urinary tract infection) [N39.0]   Discharge Diagnosis  Dehydration [E86.0] UTI (lower urinary tract infection) [N39.0]     Active Problems:   DM2 (diabetes mellitus, type 2)   Acute-on-chronic kidney injury   UTI (lower urinary tract infection)      Past Medical History  Diagnosis Date  . Blood transfusion without reported diagnosis years ago  . Diabetes mellitus without complication   . Hypertension   . UTI (lower urinary tract infection)   . CHF (congestive heart failure) years ago    no current cardiologist, pt does not remember having chf  . History of kidney stones     x 2 in past    Past Surgical History  Procedure Laterality Date  . Toe amputation    . Cesarean section    . Cystoscopy with stent placement  01/16/2012    Procedure: CYSTOSCOPY WITH STENT PLACEMENT;  Surgeon: Dutch Gray, MD;  Location: [facility];  Service: Urology;  Laterality: Left;  cysto, retrograde , ureteroscopy , stone extraction with laser lithotripsy, stent placement on left, bladder biopsies with fulgaration  . Holmium laser application  53/29/9242    Procedure: HOLMIUM LASER APPLICATION;  Surgeon: Dutch Gray, MD;  Location: [facility];  Service: Urology;  Laterality: Left;  . Amputation Left 06/01/2012    Procedure: LEFT FOOT TRANSMETATARSEL AMPUTATION;  Surgeon: Wylene Simmer, MD;  Location: [place];  Service: Orthopedics;  Laterality: Left;  . Achilles tendon surgery Left 06/01/2012    Procedure: PERCUTANEOUS TENDON ACHILLES LENGTHING;  Surgeon: Wylene Simmer, MD;  Location: [place];  Service: Orthopedics;  Laterality: Left;  . Amputation Right 06/20/2014    Procedure: AMPUTATION FOOT;  Surgeon: Newt Minion, MD;  Location: [place];  Service: Orthopedics;  Laterality: Right;  . Cystoscopy w/ ureteral stent placement Left 08/08/2014    Procedure: CYSTOSCOPY WITH RETROGRADE PYELOGRAM/URETERAL STENT PLACEMENT;  Surgeon: Festus Aloe, MD;  Location: [place];  Service: Urology;  Laterality: Left;  Marland Kitchen Eye surgery Bilateral     lens replacements for cataracts  . Cystoscopy with retrograde pyelogram, ureteroscopy and stent placement Left 09/05/2014    Procedure: CYSTO, LEFT RETROGRADE PYELOGRAM, LEFT URETEROSCOPY AND STENT PLACEMENT;  Surgeon: Festus Aloe, MD;  Location: [facility];  Service: Urology;  Laterality: Left;  . Holmium laser application Left 08/05/3417    Procedure: LEFT HOLMIUM LASER APPLICATION;  Surgeon: Festus Aloe, MD;  Location: [facility];  Service: Urology;  Laterality: Left;       HPI  from the history and physical done on the day of admission:     Ruth Douglas is a 74 y.o. female with a history of diabetes, hypertension, kidney stones, with hospitalizations starting in 2020/12/314 for sepsis with candidemia. She had a lithotripsy with left ureteroscopy and stent placement that same day secondary to sepsis. The patient has been on anti-fungals since her discharge until Saturday. However, she is continued to have some abdominal pain. She was also on nitrofurantoin for prophylaxis to prevent bladder infections. Over the past 48 hours, her left constant, nonradiating abdominal pain has increased. Her pain is worsened  with movements and improved with rest. She has additional nausea, vomiting that occurred earlier this morning.    Hospital Course:     1. Nausea with suprapubic abdominal pain. Has history of kidney stones and fungemia, recently completed treatment for fungemia. Also has history of uterine prolapse. This morning she is symptom-free. UA did suggest UTI and she responded well to Zosyn, urine cultures so far growing more than 100,000 colonies of gram-negative rods, will transition her on 1 more week of oral Vantin with outpatient PCP, ID and urology follow-up. 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 Have requested her to follow with her [facility] physician promptly as well.  We obtained CT scan abdomen pelvis which ruled out any obstructing stones, consulted urology over the phone on 09/24/2014 and they recommended outpatient follow-up.   I will request primary care physician to kindly follow final urine culture results next visit and check CBC, CMP next visit, kindly schedule outpatient ID, OB and urology follow-up.   2. DM type II . Poor outpatient control, continue home regimen with outpatient follow-up with PCP for glycemic control and A1c monitoring.  Lab Results  Component Value Date   HGBA1C 11.9* 06/19/2014    CBG (last 3)   Recent Labs  09/24/14 1704 09/24/14 2153 09/25/14 0752  GLUCAP 121* 160* 88     3. PAD. Recent right foot transmetatarsal amputation done by Dr. Sharol Given. Old left transmetatarsal and petition. Stable. Supportive care.   4. Chronic disease. 4. Creatinine baseline 1.5   5.HTN - On Norvasc stable continue.   6.AOCD - Stable.   Discharge Condition: Stable  Follow UP  Follow-up Information    Follow up with ESKRIDGE, MATTHEW, MD. Schedule an appointment as soon as possible for a visit in 3 days.   Specialty:  Urology    Why:  Renal stone   Contact information:   [place] [address] [phone]       Follow up with Carlyle Basques, MD. Schedule an appointment as soon as possible for a visit in 1 week.   Specialty:  Infectious Diseases   Why:  Fungemia, recurrent Cystitis   Contact information:   [address] [place] [address] [phone]       Follow up with Your PCP and OB MD. Schedule an appointment as soon as possible for a visit in 3 days.       Consults obtained - Urology over the Phone  Diet and Activity recommendation: See Discharge Instructions below  Discharge Instructions       Discharge Instructions    Discharge instructions    Complete by:  As directed  Follow with Primary MD and OB MD in 3-4 days   Get CBC, CMP, 2 view Chest X ray checked  by Primary MD next visit.    Activity: As tolerated with Full fall precautions use walker/cane & assistance as needed   Disposition Home    Diet: Heart Healthy Low Carb.  For Heart failure patients - Check your Weight same time everyday, if you gain over 2 pounds, or you develop in leg swelling, experience more shortness of breath or chest pain, call your Primary MD immediately. Follow Cardiac Low Salt Diet and 1.5 lit/day fluid restriction.   On your next visit with your primary care physician please Get Medicines reviewed and adjusted.   Please request your Prim.MD to go over all Hospital Tests and Procedure/Radiological results at the follow up, please get all Hospital records sent to your Prim MD by signing hospital release before you go home.   If you experience worsening of your admission symptoms, develop shortness of breath, life threatening emergency, suicidal or homicidal thoughts you must seek medical attention immediately by calling 911 or calling your MD immediately  if symptoms less severe.  You Must read complete instructions/literature along with all the possible adverse  reactions/side effects for all the Medicines you take and that have been prescribed to you. Take any new Medicines after you have completely understood and accpet all the possible adverse reactions/side effects.   Do not drive, operating heavy machinery, perform activities at heights, swimming or participation in water activities or provide baby sitting services if your were admitted for syncope or siezures until you have seen by Primary MD or a Neurologist and advised to do so again.  Do not drive when taking Pain medications.    Do not take more than prescribed Pain, Sleep and Anxiety Medications  Special Instructions: If you have smoked or chewed Tobacco  in the last 2 yrs please stop smoking, stop any regular Alcohol  and or any Recreational drug use.  Wear Seat belts while driving.   Please note  You were cared for by a hospitalist during your hospital stay. If you have any questions about your discharge medications or the care you received while you were in the hospital after you are discharged, you can call the unit and asked to speak with the hospitalist on call if the hospitalist that took care of you is not available. Once you are discharged, your primary care physician will handle any further medical issues. Please note that NO REFILLS for any discharge medications will be authorized once you are discharged, as it is imperative that you return to your primary care physician (or establish a relationship with a primary care physician if you do not have one) for your aftercare needs so that they can reassess your need for medications and monitor your lab values.     Increase activity slowly    Complete by:  As directed              Discharge Medications       Medication List    TAKE these medications        acetaminophen 500 MG tablet  Commonly known as:  TYLENOL  Take 1,000 mg by mouth every 6 (six) hours as needed for mild pain (pain).     amLODipine 5 MG tablet    Commonly known as:  NORVASC  Take 5 mg by mouth every morning.     cefpodoxime 200 MG tablet  Commonly known as:  VANTIN  Take 1 tablet (200 mg total) by mouth 2 (two) times daily.     CRANBERRY PO  Take 1 capsule by mouth every morning.     docusate sodium 100 MG capsule  Commonly known as:  COLACE  Take 1 capsule (100 mg total) by mouth daily as needed for mild constipation.     ferrous sulfate 325 (65 FE) MG tablet  Take 1 tablet (325 mg total) by mouth 2 (two) times daily with a meal.     LANTUS SOLOSTAR 100 UNIT/ML Solostar Pen  Generic drug:  Insulin Glargine  inject 15 units at bedtime     nitrofurantoin (macrocrystal-monohydrate) 100 MG capsule  Commonly known as:  MACROBID  Take 1 capsule (100 mg total) by mouth at bedtime.  Start taking on:  09/30/2014     oxyCODONE-acetaminophen 5-325 MG per tablet  Commonly known as:  PERCOCET  Take 1 tablet by mouth every 6 (six) hours as needed for moderate pain.     saccharomyces boulardii 250 MG capsule  Commonly known as:  FLORASTOR  Take 1 capsule (250 mg total) by mouth 2 (two) times daily. Can substitue        Major procedures and Radiology Reports - PLEASE review detailed and final reports for all details, in brief -       Ct Abdomen Pelvis Wo Contrast  09/24/2014   CLINICAL DATA:  Abdominal pain all over.  History of kidney stones.  EXAM: CT ABDOMEN AND PELVIS WITHOUT CONTRAST  TECHNIQUE: Multidetector CT imaging of the abdomen and pelvis was performed following the standard protocol without IV contrast.  COMPARISON:  09/11/2014, 11/20/2012, 08/08/2014  FINDINGS: Lower chest: Clear lung bases. Coronary artery atherosclerosis in the RCA. Normal heart size.  Hepatobiliary: Normal liver.  Normal gallbladder.  Pancreas: Normal.  Spleen: Normal.  Adrenals/Urinary Tract: Normal adrenal glands. Large crescentic subcapsular fluid collection involving the left kidney measuring 3.5 x 8.1 cm similar to the prior examination of  09/11/2014 likely reflecting a liquified hematoma with mass effect on the left renal cortex. There is a punctate nonobstructing left renal calculus. There is no obstructive uropathy. Diffuse bladder wall thickening.  Stomach/Bowel: No bowel wall thickening or dilatation. No pneumatosis, pneumoperitoneum or portal venous gas. No abdominal or pelvic free fluid.  Vascular/Lymphatic: Normal caliber abdominal aorta with atherosclerosis. No abdominal or pelvic lymphadenopathy.  Reproductive: Normal uterus and ovaries.  No adnexal mass.  Musculoskeletal: No acute osseous abnormality. No lytic or sclerotic osseous lesion. Mild axial osteoarthritis of bilateral hips.  IMPRESSION: 1. Persistent large left renal subcapsular fluid collection most consistent with a liquified hematoma given the appearance on the prior exam of 09/11/2014. There is resulting compression of the left renal cortex. 2. Punctate nonobstructing left renal calculus. 3. Severe bladder wall thickening which is unchanged compared with 08/08/2014 most consistent with cystitis.   Electronically Signed   By: Kathreen Devoid   On: 09/24/2014 13:36   Ct Abdomen Pelvis W Contrast  09/11/2014   CLINICAL DATA:  Fever of unknown origin  EXAM: CT ABDOMEN AND PELVIS WITH CONTRAST  TECHNIQUE: Multidetector CT imaging of the abdomen and pelvis was performed using the standard protocol following bolus administration of intravenous contrast.  CONTRAST:  162mL OMNIPAQUE IOHEXOL 300 MG/ML  SOLN  COMPARISON:  08/08/2014  FINDINGS: Lower chest: There are small bilateral pleural effusions. Overlying compressive type atelectasis is identified.  Hepatobiliary: No suspicious liver abnormality. The gallbladder appears normal. There is no biliary dilatation.  Pancreas: Normal appearance  of the pancreas.  Spleen: The spleen is unremarkable.  Adrenals/Urinary Tract: The adrenal glands are both normal. Two cysts are noted within the upper pole of the right kidney with attenuation and  enhancement characteristics compatible with Bosniak 1 type lesions. Subcapsular hematoma is identified involving the left kidney. This measures 6.8 x 3.0 x 8.2 cm. There is no gas within the subcapsular fluid collection. 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 Left-sided pelvocaliectasis noted. There is asymmetric enhancement of the mildly dilated left ureter. No ureteral calculi identified. Bladder wall thickening is again noted. No focal filling defects within the bladder noted. On the delayed images there is symmetric excretion of contrast material from both kidneys. The entire left ureter is opacified and appears patent.  Stomach/Bowel: The stomach is within normal limits. The small bowel loops have a normal course and caliber. No obstruction. Normal appearance of the colon. The appendix is visualized and appears normal.  Vascular/Lymphatic: Calcified atherosclerotic disease involves the abdominal aorta. No aneurysm. No enlarged retroperitoneal or mesenteric adenopathy. No enlarged pelvic or inguinal lymph nodes.  Reproductive: The uterus and adnexal structures are unremarkable.  Other: There is a trace amount of free fluid within the dependent portion of the pelvis.  Musculoskeletal: The visualized bony structures are on unremarkable.  IMPRESSION: 1. Left-sided subcapsular fluid collection. Favor hematoma status post lithotripsy. In the setting of fever infected hematoma cannot be excluded. 2. Diffuse bladder wall thickening identified compatible with cystitis. 3. Right renal cyst. 4. Aortic atherosclerosis.   Electronically Signed   By: Kerby Moors M.D.   On: 09/11/2014 15:57    Micro Results      Recent Results (from the past 240 hour(s))  Urine culture     Status: None (Preliminary result)   Collection Time: 09/23/14 11:04 AM  Result Value Ref Range Status   Specimen Description URINE, CLEAN CATCH  Final   Special Requests NONE  Final   Culture  >=100,000 COLONIES/mL GRAM NEGATIVE RODS  Final   Report Status PENDING  Incomplete       Today   Subjective    Tametha Daugherty today has no headache,no chest abdominal pain,no new weakness tingling or numbness, feels much better wants to go home today.     Objective   Blood pressure 148/55, pulse 78, temperature 97.7 F (36.5 C), temperature source Oral, resp. rate 18, height 5\' 3"  (1.6 m), weight 63.2 kg (139 lb 5.3 oz), SpO2 98 %.   Intake/Output Summary (Last 24 hours) at 09/25/14 0955 Last data filed at 09/25/14 0400  Gross per 24 hour  Intake    470 ml  Output      2 ml  Net    468 ml    Exam Awake Alert, Oriented x 3, No new F.N deficits, Normal affect [place].AT,PERRAL Supple Neck,No JVD, No cervical lymphadenopathy appriciated.  Symmetrical Chest wall movement, Good air movement bilaterally, CTAB RRR,No Gallops,Rubs or new Murmurs, No Parasternal Heave +ve B.Sounds, Abd Soft, Non tender, No organomegaly appriciated, No rebound -guarding or rigidity. No Cyanosis, Clubbing or edema, No new Rash or bruise bilateral transmetatarsal foot amputation   Data Review   CBC w Diff: Lab Results  Component Value Date   WBC 6.8 09/23/2014   HGB 12.6 09/23/2014   HCT 37.0 09/23/2014   PLT 484* 09/23/2014   LYMPHOPCT 15 09/10/2014   MONOPCT 8 09/10/2014   EOSPCT 1 09/10/2014   BASOPCT 0 09/10/2014  CMP: Lab Results  Component Value Date   NA 135 09/24/2014   K 4.0 09/24/2014   CL 101 09/24/2014   CO2 28 09/24/2014   BUN 14 09/24/2014   CREATININE 1.58* 09/24/2014   PROT 8.8* 09/23/2014   ALBUMIN 3.4* 09/23/2014   BILITOT 0.6 09/23/2014   ALKPHOS 89 09/23/2014   AST 20 09/23/2014   ALT 12* 09/23/2014  .   Total Time in preparing paper work, data evaluation and todays exam - 35 minutes  Thurnell Lose M.D on 09/25/2014 at 9:55 AM  [facility]   Office  [phone]

## 2014-10-02 ENCOUNTER — Encounter: Payer: Self-pay | Admitting: Internal Medicine

## 2014-10-02 ENCOUNTER — Ambulatory Visit (INDEPENDENT_AMBULATORY_CARE_PROVIDER_SITE_OTHER): Payer: Medicare FFS | Admitting: Internal Medicine

## 2014-10-02 VITALS — BP 136/82 | HR 99 | Temp 98.1°F | Ht 63.0 in | Wt 135.0 lb

## 2014-10-02 DIAGNOSIS — N814 Uterovaginal prolapse, unspecified: Secondary | ICD-10-CM

## 2014-10-02 DIAGNOSIS — N390 Urinary tract infection, site not specified: Secondary | ICD-10-CM | POA: Diagnosis not present

## 2014-10-02 DIAGNOSIS — B377 Candidal sepsis: Secondary | ICD-10-CM

## 2014-10-03 ENCOUNTER — Ambulatory Visit: Payer: Medicare HMO | Admitting: Family Medicine

## 2014-10-03 DIAGNOSIS — N814 Uterovaginal prolapse, unspecified: Secondary | ICD-10-CM | POA: Insufficient documentation

## 2014-10-03 NOTE — Assessment & Plan Note (Signed)
Ok from an ID standpoint for surgery, if indicated.

## 2014-10-03 NOTE — Progress Notes (Signed)
   Subjective:    Patient ID: Ruth Douglas, female    DOB: [birth year], 74 y.o.   MRN: [id number]  HPI Here for hsfu.  Has a history of CKD, DM, kidney stone and underwent left ureteroscopy holmium laser lithotripsy, stone basket extraction and stent placement due to sepsis and culturess grew Candida krusei.  She was treated with 14 days of Eraxis which she completed.  She was again hospitalized 7/26 with abdominal pain likely from UTI that grew GNR, treated with IV zosyn and transitioned to oral Vantin at discharge.  She has a history of uterine prolopse and hopeful to get corrective surgery soon.     She comes in for follow up and is feeling well.  No fever, no chills. No further abdominal pain, no urinary discomfort.    Review of Systems  Constitutional: Negative for fever, chills and fatigue.  Gastrointestinal: Negative for nausea and diarrhea.  Skin: Negative for rash.  Neurological: Negative for dizziness.       Objective:   Physical Exam  Constitutional: She appears well-developed and well-nourished. No distress.  HENT:  Mouth/Throat: No oropharyngeal exudate.  Eyes: No scleral icterus.  Cardiovascular: Normal rate, regular rhythm and normal heart sounds.   No murmur heard. Pulmonary/Chest: Effort normal and breath sounds normal. No respiratory distress.  Lymphadenopathy:    She has no cervical adenopathy.  Skin: No rash noted.          Assessment & Plan:

## 2014-10-03 NOTE — Assessment & Plan Note (Signed)
Reviewed micro, patient now doing well.  Can return PRN

## 2014-10-03 NOTE — Assessment & Plan Note (Signed)
Doing well from this.  Reviewed hospital course and culture.  No further indication for treatment.

## 2015-03-11 DIAGNOSIS — R1013 Epigastric pain: Secondary | ICD-10-CM | POA: Diagnosis not present

## 2015-03-11 DIAGNOSIS — D649 Anemia, unspecified: Secondary | ICD-10-CM | POA: Diagnosis not present

## 2015-03-11 DIAGNOSIS — N390 Urinary tract infection, site not specified: Secondary | ICD-10-CM | POA: Diagnosis not present

## 2015-03-11 DIAGNOSIS — R109 Unspecified abdominal pain: Secondary | ICD-10-CM | POA: Diagnosis not present

## 2015-03-11 DIAGNOSIS — Z794 Long term (current) use of insulin: Secondary | ICD-10-CM | POA: Diagnosis not present

## 2015-03-11 DIAGNOSIS — E1165 Type 2 diabetes mellitus with hyperglycemia: Secondary | ICD-10-CM | POA: Diagnosis not present

## 2015-03-11 DIAGNOSIS — R1030 Lower abdominal pain, unspecified: Secondary | ICD-10-CM | POA: Diagnosis not present

## 2015-03-11 DIAGNOSIS — I10 Essential (primary) hypertension: Secondary | ICD-10-CM | POA: Diagnosis not present

## 2015-03-11 DIAGNOSIS — I129 Hypertensive chronic kidney disease with stage 1 through stage 4 chronic kidney disease, or unspecified chronic kidney disease: Secondary | ICD-10-CM | POA: Diagnosis not present

## 2015-03-11 DIAGNOSIS — R1110 Vomiting, unspecified: Secondary | ICD-10-CM | POA: Diagnosis not present

## 2015-03-11 DIAGNOSIS — N189 Chronic kidney disease, unspecified: Secondary | ICD-10-CM | POA: Diagnosis not present

## 2015-03-12 DIAGNOSIS — R112 Nausea with vomiting, unspecified: Secondary | ICD-10-CM | POA: Diagnosis not present

## 2015-03-12 DIAGNOSIS — E119 Type 2 diabetes mellitus without complications: Secondary | ICD-10-CM | POA: Diagnosis not present

## 2015-03-13 DIAGNOSIS — R112 Nausea with vomiting, unspecified: Secondary | ICD-10-CM | POA: Diagnosis not present

## 2015-03-13 DIAGNOSIS — E119 Type 2 diabetes mellitus without complications: Secondary | ICD-10-CM | POA: Diagnosis not present

## 2015-03-27 ENCOUNTER — Ambulatory Visit: Payer: Medicare HMO | Admitting: Family Medicine

## 2015-06-23 DIAGNOSIS — R69 Illness, unspecified: Secondary | ICD-10-CM | POA: Diagnosis not present

## 2015-06-23 DIAGNOSIS — D649 Anemia, unspecified: Secondary | ICD-10-CM | POA: Diagnosis not present

## 2015-06-23 DIAGNOSIS — Z89432 Acquired absence of left foot: Secondary | ICD-10-CM | POA: Diagnosis not present

## 2015-06-23 DIAGNOSIS — E860 Dehydration: Secondary | ICD-10-CM | POA: Diagnosis not present

## 2015-06-23 DIAGNOSIS — Z89431 Acquired absence of right foot: Secondary | ICD-10-CM | POA: Diagnosis not present

## 2015-06-23 DIAGNOSIS — N3000 Acute cystitis without hematuria: Secondary | ICD-10-CM | POA: Diagnosis not present

## 2015-06-23 DIAGNOSIS — N289 Disorder of kidney and ureter, unspecified: Secondary | ICD-10-CM | POA: Diagnosis not present

## 2015-06-23 DIAGNOSIS — I129 Hypertensive chronic kidney disease with stage 1 through stage 4 chronic kidney disease, or unspecified chronic kidney disease: Secondary | ICD-10-CM | POA: Diagnosis not present

## 2015-06-23 DIAGNOSIS — R197 Diarrhea, unspecified: Secondary | ICD-10-CM | POA: Diagnosis not present

## 2015-06-23 DIAGNOSIS — E871 Hypo-osmolality and hyponatremia: Secondary | ICD-10-CM | POA: Diagnosis not present

## 2015-06-23 DIAGNOSIS — K529 Noninfective gastroenteritis and colitis, unspecified: Secondary | ICD-10-CM | POA: Diagnosis not present

## 2015-06-23 DIAGNOSIS — E1165 Type 2 diabetes mellitus with hyperglycemia: Secondary | ICD-10-CM | POA: Diagnosis not present

## 2015-06-23 DIAGNOSIS — A09 Infectious gastroenteritis and colitis, unspecified: Secondary | ICD-10-CM | POA: Diagnosis not present

## 2015-06-23 DIAGNOSIS — R112 Nausea with vomiting, unspecified: Secondary | ICD-10-CM | POA: Diagnosis not present

## 2015-06-23 DIAGNOSIS — N3001 Acute cystitis with hematuria: Secondary | ICD-10-CM | POA: Diagnosis not present

## 2015-06-23 DIAGNOSIS — E1122 Type 2 diabetes mellitus with diabetic chronic kidney disease: Secondary | ICD-10-CM | POA: Diagnosis not present

## 2015-06-23 DIAGNOSIS — N182 Chronic kidney disease, stage 2 (mild): Secondary | ICD-10-CM | POA: Diagnosis not present

## 2015-06-24 DIAGNOSIS — K529 Noninfective gastroenteritis and colitis, unspecified: Secondary | ICD-10-CM | POA: Diagnosis not present

## 2015-06-24 DIAGNOSIS — E860 Dehydration: Secondary | ICD-10-CM | POA: Diagnosis not present

## 2015-06-24 DIAGNOSIS — N289 Disorder of kidney and ureter, unspecified: Secondary | ICD-10-CM | POA: Diagnosis not present

## 2015-06-24 DIAGNOSIS — R197 Diarrhea, unspecified: Secondary | ICD-10-CM | POA: Diagnosis not present

## 2015-06-25 DIAGNOSIS — N289 Disorder of kidney and ureter, unspecified: Secondary | ICD-10-CM | POA: Diagnosis not present

## 2015-06-25 DIAGNOSIS — R197 Diarrhea, unspecified: Secondary | ICD-10-CM | POA: Diagnosis not present

## 2015-06-25 DIAGNOSIS — E860 Dehydration: Secondary | ICD-10-CM | POA: Diagnosis not present

## 2015-06-25 DIAGNOSIS — K529 Noninfective gastroenteritis and colitis, unspecified: Secondary | ICD-10-CM | POA: Diagnosis not present

## 2015-08-13 ENCOUNTER — Encounter: Payer: Self-pay | Admitting: Family Medicine

## 2015-08-13 ENCOUNTER — Ambulatory Visit (INDEPENDENT_AMBULATORY_CARE_PROVIDER_SITE_OTHER): Payer: Medicare HMO | Admitting: Family Medicine

## 2015-08-13 VITALS — BP 183/83 | HR 109 | Temp 98.3°F | Ht 63.0 in | Wt 151.2 lb

## 2015-08-13 DIAGNOSIS — N183 Chronic kidney disease, stage 3 (moderate): Secondary | ICD-10-CM

## 2015-08-13 DIAGNOSIS — Z794 Long term (current) use of insulin: Secondary | ICD-10-CM | POA: Diagnosis not present

## 2015-08-13 DIAGNOSIS — I10 Essential (primary) hypertension: Secondary | ICD-10-CM | POA: Diagnosis not present

## 2015-08-13 DIAGNOSIS — E785 Hyperlipidemia, unspecified: Secondary | ICD-10-CM

## 2015-08-13 DIAGNOSIS — E11311 Type 2 diabetes mellitus with unspecified diabetic retinopathy with macular edema: Secondary | ICD-10-CM | POA: Diagnosis not present

## 2015-08-13 LAB — BAYER DCA HB A1C WAIVED: HB A1C (BAYER DCA - WAIVED): 11.8 % — ABNORMAL HIGH (ref <7.0)

## 2015-08-13 MED ORDER — LANTUS SOLOSTAR 100 UNIT/ML ~~LOC~~ SOPN
24.0000 [IU] | PEN_INJECTOR | Freq: Every day | SUBCUTANEOUS | Status: DC
Start: 2015-08-13 — End: 2015-08-17

## 2015-08-13 NOTE — Progress Notes (Signed)
BP 183/83 mmHg  Pulse 109  Temp(Src) 98.3 F (36.8 C) (Oral)  Ht '5\' 3"'  (1.6 m)  Wt 151 lb 3.2 oz (68.584 kg)  BMI 26.79 kg/m2   Subjective:    Patient ID: Ruth Douglas, female    DOB: 03-24-1940, 75 y.o.   MRN: [id number]  HPI: Ruth Douglas is a 75 y.o. female presenting on 08/13/2015 for Establish Care   HPI Hypertension Patient is coming to establish care with our office today. She has been seen a physician previously but then they retired and that has been going sporadically to emergency departments and given some medications from those. She has been out of all of her medications for the past 2 months. She was taking amlodipine for blood pressure and is not currently on that. Her blood pressure today is 183/83. Patient denies headaches, blurred vision, chest pains, shortness of breath, or weakness. Denies any side effects from medication and is content with current medication.    Type 2 diabetes uncontrolled Patient is also coming in today to get established for type 2 diabetes. She says that she was previously on Lantus once daily 24 units at bedtime. She says when she was in the hospital just a week or so ago she had blood sugars up in the 3 and 400s. She has known visual deficits and retinopathy and has seen ophthalmology before but not recently. She has known kidney disease but has not seen a nephrologist. Before she was going to a free clinic and did not have insurance. 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  Patient also is known to have chronic kidney disease and hyperlipidemia but is not currently on any medications for either of those. She is here to establish care with [place] and also do labs.  Relevant past medical, surgical, family and social history reviewed and updated as indicated. Interim medical history since our last visit reviewed. Allergies and medications reviewed and updated.  Review of Systems    Constitutional: Negative for fever and chills.  HENT: Negative for congestion, ear discharge and ear pain.   Eyes: Positive for visual disturbance. Negative for redness.  Respiratory: Negative for chest tightness and shortness of breath.   Cardiovascular: Negative for chest pain, palpitations and leg swelling.  Gastrointestinal: Negative for abdominal pain and abdominal distention.  Genitourinary: Negative for dysuria, frequency and difficulty urinating.  Musculoskeletal: Negative for back pain and gait problem.  Skin: Negative for rash.  Neurological: Negative for dizziness, light-headedness and headaches.  Psychiatric/Behavioral: Negative for behavioral problems and agitation.  All other systems reviewed and are negative.   Per HPI unless specifically indicated above  Social History   Social History  . Marital Status: Widowed    Spouse Name: N/A  . Number of Children: N/A  . Years of Education: N/A   Occupational History  . Not on file.   Social History Main Topics  . Smoking status: Never Smoker   . Smokeless tobacco: Never Used  . Alcohol Use: No  . Drug Use: No  . Sexual Activity: No     Comment: husband passed 2016   Other Topics Concern  . Not on file   Social History Narrative    Past Surgical History  Procedure Laterality Date  . Toe amputation    . Cesarean section    . Cystoscopy with stent placement  01/16/2012    Procedure: CYSTOSCOPY WITH STENT PLACEMENT;  Surgeon: Dutch Gray,  MD;  Location: [facility];  Service: Urology;  Laterality: Left;  cysto, retrograde , ureteroscopy , stone extraction with laser lithotripsy, stent placement on left, bladder biopsies with fulgaration  . Holmium laser application  62/13/0865    Procedure: HOLMIUM LASER APPLICATION;  Surgeon: Dutch Gray, MD;  Location: [facility];  Service: Urology;  Laterality: Left;  . Amputation Left 06/01/2012    Procedure: LEFT FOOT TRANSMETATARSEL AMPUTATION;  Surgeon: Wylene Simmer, MD;  Location: [facility];  Service: Orthopedics;  Laterality: Left;  . Achilles tendon surgery Left 06/01/2012    Procedure: PERCUTANEOUS TENDON ACHILLES LENGTHING;  Surgeon: Wylene Simmer, MD;  Location: [facility];  Service: Orthopedics;  Laterality: Left;  . Amputation Right 06/20/2014    Procedure: AMPUTATION FOOT;  Surgeon: Newt Minion, MD;  Location: [facility];  Service: Orthopedics;  Laterality: Right;  . Cystoscopy w/ ureteral stent placement Left 08/08/2014    Procedure: CYSTOSCOPY WITH RETROGRADE PYELOGRAM/URETERAL STENT PLACEMENT;  Surgeon: Festus Aloe, MD;  Location: [facility];  Service: Urology;  Laterality: Left;  Marland Kitchen Eye surgery Bilateral     lens replacements for cataracts  . Cystoscopy with retrograde pyelogram, ureteroscopy and stent placement Left 09/05/2014    Procedure: CYSTO, LEFT RETROGRADE PYELOGRAM, LEFT URETEROSCOPY AND STENT PLACEMENT;  Surgeon: Festus Aloe, MD;  [facility];  Service: Urology;  Laterality: Left;  . Holmium laser application Left 09/05/4694    Procedure: LEFT HOLMIUM LASER APPLICATION;  Surgeon: Festus Aloe, MD;  Location[facility];  Service: Urology;  Laterality: Left;  . Abdominal hysterectomy  08/2014    [facility]    Family History  Problem Relation Age of Onset  . Diabetes Mellitus II Mother   . Diabetes Mellitus II Father   . Lung cancer Sister   . Kidney cancer Brother       Medication List       This list is accurate as of: 08/13/15 10:20 AM.  Always use your most recent med list.               acetaminophen 500 MG tablet  Commonly known as:  TYLENOL  Take 1,000 mg by mouth every 6 (six) hours as needed for mild pain (pain). Reported on 08/13/2015     amLODipine 5 MG tablet  Commonly known as:  NORVASC  Take 5 mg by mouth every morning.     CRANBERRY PO  Take 1 capsule by mouth every morning. Reported on 08/13/2015     LANTUS SOLOSTAR 100 UNIT/ML Solostar Pen  Generic drug:  Insulin Glargine  Inject 24 Units into the skin daily at 10 pm.            Objective:    BP 183/83 mmHg  Pulse 109  Temp(Src) 98.3 F (36.8 C) (Oral)  Ht '5\' 3"'  (1.6 m)  Wt 151 lb 3.2 oz (68.584 kg)  BMI 26.79 kg/m2  Wt Readings from Last 3 Encounters:  08/13/15 151 lb 3.2 oz (68.584 kg)  10/02/14 135 lb (61.236 kg)  09/24/14 139 lb 5.3 oz (63.2 kg)    Physical Exam  Constitutional: She is oriented to person, place, and time. She appears well-developed and well-nourished. No distress.  Eyes: Conjunctivae and EOM are normal. Pupils are equal, round, and reactive to light.  Neck: Neck supple. No thyromegaly present.  Cardiovascular: Normal rate, regular rhythm, normal heart sounds and intact distal pulses.   No murmur heard. Pulmonary/Chest: Effort normal and breath sounds normal. No respiratory distress. She has no wheezes.  Musculoskeletal: Normal range of motion. She exhibits no edema or tenderness.  Patient has had all of her toes amputated. The ends of her feet is in the cuff and appears to be well-healed  Lymphadenopathy:    She has no cervical adenopathy.  Neurological: She is alert and oriented to person, place, and time. Coordination normal.  Skin: Skin is warm and dry. No rash noted. She is not diaphoretic.  Psychiatric: She has a normal mood and affect. Her behavior is normal.  Nursing note and vitals reviewed.  Diabetic Foot Exam - Simple   Simple Foot Form  Diabetic Foot exam was performed with the following findings:  Yes 08/13/2015  4:02 PM  Visual Inspection  See comments:  Yes  Sensation Testing  Intact to touch and monofilament testing bilaterally:  Yes  Pulse Check  Posterior Tibialis and Dorsalis pulse intact bilaterally:  Yes  Comments  Patient has good pulses and sensation intact. She has had all of her toes amputated on both feet. Cough appears to be healed well. No ulcerations noted         Assessment & Plan:   Problem List Items Addressed This Visit      Cardiovascular and Mediastinum   Essential hypertension      Endocrine   DM2 (diabetes mellitus, type 2) ([place]) - Primary   Relevant Medications   LANTUS SOLOSTAR 100 UNIT/ML Solostar Pen   Other Relevant Orders   Bayer DCA Hb A1c Waived (Completed)   TSH (Completed)     Genitourinary   CKD (chronic kidney disease)   Relevant Orders   CMP14+EGFR (Completed)   CBC with Differential/Platelet (Completed)     Other   Hyperlipidemia   Relevant Orders   Lipid panel (Completed)      Follow up plan: Return in about 4 weeks (around 09/10/2015), or if symptoms worsen or fail to improve, for htn, recheck.  Caryl Pina, MD [facility] 08/13/2015, 10:20 AM

## 2015-08-14 ENCOUNTER — Telehealth: Payer: Self-pay | Admitting: Family Medicine

## 2015-08-14 LAB — LIPID PANEL
CHOL/HDL RATIO: 6.3 ratio — AB (ref 0.0–4.4)
CHOLESTEROL TOTAL: 328 mg/dL — AB (ref 100–199)
HDL: 52 mg/dL (ref >39)
TRIGLYCERIDES: 430 mg/dL — AB (ref 0–149)

## 2015-08-14 LAB — CBC WITH DIFFERENTIAL/PLATELET
BASOS: 0 %
Basophils Absolute: 0 10*3/uL (ref 0.0–0.2)
EOS (ABSOLUTE): 0.1 10*3/uL (ref 0.0–0.4)
EOS: 1 %
Hematocrit: 36 % (ref 34.0–46.6)
Hemoglobin: 11.6 g/dL (ref 11.1–15.9)
IMMATURE GRANULOCYTES: 0 %
Immature Grans (Abs): 0 10*3/uL (ref 0.0–0.1)
LYMPHS ABS: 1.6 10*3/uL (ref 0.7–3.1)
Lymphs: 20 %
MCH: 27.4 pg (ref 26.6–33.0)
MCHC: 32.2 g/dL (ref 31.5–35.7)
MCV: 85 fL (ref 79–97)
MONOS ABS: 0.4 10*3/uL (ref 0.1–0.9)
Monocytes: 5 %
NEUTROS ABS: 5.9 10*3/uL (ref 1.4–7.0)
Neutrophils: 74 %
Platelets: 254 10*3/uL (ref 150–379)
RBC: 4.24 x10E6/uL (ref 3.77–5.28)
RDW: 13.5 % (ref 12.3–15.4)
WBC: 7.9 10*3/uL (ref 3.4–10.8)

## 2015-08-14 LAB — CMP14+EGFR
ALK PHOS: 101 IU/L (ref 39–117)
ALT: 16 IU/L (ref 0–32)
AST: 17 IU/L (ref 0–40)
Albumin/Globulin Ratio: 1.4 (ref 1.2–2.2)
Albumin: 4.6 g/dL (ref 3.5–4.8)
BUN/Creatinine Ratio: 18 (ref 12–28)
BUN: 28 mg/dL — AB (ref 8–27)
Bilirubin Total: 0.3 mg/dL (ref 0.0–1.2)
CO2: 28 mmol/L (ref 18–29)
CREATININE: 1.56 mg/dL — AB (ref 0.57–1.00)
Calcium: 10.9 mg/dL — ABNORMAL HIGH (ref 8.7–10.3)
Chloride: 87 mmol/L — ABNORMAL LOW (ref 96–106)
GFR calc Af Amer: 38 mL/min/{1.73_m2} — ABNORMAL LOW (ref >59)
GFR calc non Af Amer: 33 mL/min/{1.73_m2} — ABNORMAL LOW (ref >59)
GLUCOSE: 412 mg/dL — AB (ref 65–99)
Globulin, Total: 3.3 g/dL (ref 1.5–4.5)
Potassium: 5.2 mmol/L (ref 3.5–5.2)
Sodium: 135 mmol/L (ref 134–144)
Total Protein: 7.9 g/dL (ref 6.0–8.5)

## 2015-08-14 LAB — TSH: TSH: 1.3 u[IU]/mL (ref 0.450–4.500)

## 2015-08-14 NOTE — Telephone Encounter (Signed)
Try to send Basaglar for her, same number of units.

## 2015-08-15 MED ORDER — BASAGLAR KWIKPEN 100 UNIT/ML ~~LOC~~ SOPN: 24.0000 [IU] | PEN_INJECTOR | Freq: Every day | SUBCUTANEOUS | Status: DC

## 2015-08-15 NOTE — Telephone Encounter (Signed)
[name] aware - new insulin sent in

## 2015-08-17 ENCOUNTER — Other Ambulatory Visit: Payer: Self-pay | Admitting: Family Medicine

## 2015-08-17 ENCOUNTER — Telehealth: Payer: Self-pay | Admitting: Family Medicine

## 2015-08-17 DIAGNOSIS — Z794 Long term (current) use of insulin: Principal | ICD-10-CM

## 2015-08-17 DIAGNOSIS — E1121 Type 2 diabetes mellitus with diabetic nephropathy: Secondary | ICD-10-CM

## 2015-08-17 MED ORDER — INSULIN DEGLUDEC 100 UNIT/ML ~~LOC~~ SOPN: 18.0000 [IU] | PEN_INJECTOR | Freq: Every day | SUBCUTANEOUS | Status: DC

## 2015-08-17 NOTE — Telephone Encounter (Signed)
Insurance will cover Levemir or Tyler Aas   Can you change???

## 2015-08-17 NOTE — Telephone Encounter (Signed)
Forwarded to [employer]

## 2015-09-10 ENCOUNTER — Ambulatory Visit: Payer: Medicare HMO | Admitting: Family Medicine

## 2015-09-22 ENCOUNTER — Telehealth: Payer: Self-pay | Admitting: Family Medicine

## 2015-11-22 DIAGNOSIS — K920 Hematemesis: Secondary | ICD-10-CM | POA: Diagnosis not present

## 2015-11-22 DIAGNOSIS — R319 Hematuria, unspecified: Secondary | ICD-10-CM | POA: Diagnosis not present

## 2015-11-22 DIAGNOSIS — R509 Fever, unspecified: Secondary | ICD-10-CM | POA: Diagnosis not present

## 2015-11-22 DIAGNOSIS — T50905A Adverse effect of unspecified drugs, medicaments and biological substances, initial encounter: Secondary | ICD-10-CM | POA: Diagnosis not present

## 2015-11-23 DIAGNOSIS — E785 Hyperlipidemia, unspecified: Secondary | ICD-10-CM | POA: Diagnosis not present

## 2015-11-23 DIAGNOSIS — D509 Iron deficiency anemia, unspecified: Secondary | ICD-10-CM | POA: Diagnosis not present

## 2015-11-23 DIAGNOSIS — E7800 Pure hypercholesterolemia, unspecified: Secondary | ICD-10-CM | POA: Diagnosis not present

## 2015-11-23 DIAGNOSIS — K2961 Other gastritis with bleeding: Secondary | ICD-10-CM | POA: Diagnosis not present

## 2015-11-23 DIAGNOSIS — T50905A Adverse effect of unspecified drugs, medicaments and biological substances, initial encounter: Secondary | ICD-10-CM | POA: Diagnosis not present

## 2015-11-23 DIAGNOSIS — K920 Hematemesis: Secondary | ICD-10-CM | POA: Diagnosis not present

## 2015-11-23 DIAGNOSIS — R509 Fever, unspecified: Secondary | ICD-10-CM | POA: Diagnosis not present

## 2015-11-23 DIAGNOSIS — R319 Hematuria, unspecified: Secondary | ICD-10-CM | POA: Diagnosis not present

## 2015-11-23 DIAGNOSIS — E119 Type 2 diabetes mellitus without complications: Secondary | ICD-10-CM | POA: Diagnosis not present

## 2015-11-23 DIAGNOSIS — N179 Acute kidney failure, unspecified: Secondary | ICD-10-CM | POA: Diagnosis not present

## 2015-11-23 DIAGNOSIS — A4150 Gram-negative sepsis, unspecified: Secondary | ICD-10-CM | POA: Diagnosis not present

## 2015-11-23 DIAGNOSIS — R7881 Bacteremia: Secondary | ICD-10-CM | POA: Diagnosis not present

## 2015-11-23 DIAGNOSIS — B961 Klebsiella pneumoniae [K. pneumoniae] as the cause of diseases classified elsewhere: Secondary | ICD-10-CM | POA: Diagnosis not present

## 2015-11-23 DIAGNOSIS — N390 Urinary tract infection, site not specified: Secondary | ICD-10-CM | POA: Diagnosis not present

## 2015-11-24 DIAGNOSIS — N390 Urinary tract infection, site not specified: Secondary | ICD-10-CM | POA: Diagnosis not present

## 2015-11-24 DIAGNOSIS — K920 Hematemesis: Secondary | ICD-10-CM | POA: Diagnosis not present

## 2015-11-25 DIAGNOSIS — A4150 Gram-negative sepsis, unspecified: Secondary | ICD-10-CM | POA: Diagnosis not present

## 2015-11-26 DIAGNOSIS — N179 Acute kidney failure, unspecified: Secondary | ICD-10-CM | POA: Diagnosis not present

## 2016-01-12 ENCOUNTER — Telehealth: Payer: Self-pay | Admitting: Family Medicine

## 2016-02-12 ENCOUNTER — Other Ambulatory Visit: Payer: Self-pay | Admitting: Family Medicine

## 2016-02-12 DIAGNOSIS — E1121 Type 2 diabetes mellitus with diabetic nephropathy: Secondary | ICD-10-CM

## 2016-02-12 DIAGNOSIS — Z794 Long term (current) use of insulin: Principal | ICD-10-CM

## 2016-02-15 ENCOUNTER — Telehealth: Payer: Self-pay | Admitting: *Deleted

## 2016-02-15 NOTE — Telephone Encounter (Signed)
Left message, need to be seen and have lab work.

## 2016-02-15 NOTE — Telephone Encounter (Signed)
We will send refill but patient needs an appointment because she has not been seen since June

## 2016-05-06 ENCOUNTER — Telehealth: Payer: Self-pay | Admitting: Pharmacist

## 2016-05-06 NOTE — Telephone Encounter (Signed)
LM on VM reminding patient that follow up DM needed.

## 2016-05-07 DIAGNOSIS — E1122 Type 2 diabetes mellitus with diabetic chronic kidney disease: Secondary | ICD-10-CM | POA: Diagnosis not present

## 2016-05-07 DIAGNOSIS — D638 Anemia in other chronic diseases classified elsewhere: Secondary | ICD-10-CM | POA: Diagnosis not present

## 2016-05-07 DIAGNOSIS — N390 Urinary tract infection, site not specified: Secondary | ICD-10-CM | POA: Diagnosis not present

## 2016-05-07 DIAGNOSIS — E1165 Type 2 diabetes mellitus with hyperglycemia: Secondary | ICD-10-CM | POA: Diagnosis not present

## 2016-05-07 DIAGNOSIS — E860 Dehydration: Secondary | ICD-10-CM | POA: Diagnosis not present

## 2016-05-07 DIAGNOSIS — Z23 Encounter for immunization: Secondary | ICD-10-CM | POA: Diagnosis not present

## 2016-05-07 DIAGNOSIS — R9431 Abnormal electrocardiogram [ECG] [EKG]: Secondary | ICD-10-CM | POA: Diagnosis not present

## 2016-05-07 DIAGNOSIS — I129 Hypertensive chronic kidney disease with stage 1 through stage 4 chronic kidney disease, or unspecified chronic kidney disease: Secondary | ICD-10-CM | POA: Diagnosis not present

## 2016-05-07 DIAGNOSIS — R0789 Other chest pain: Secondary | ICD-10-CM | POA: Diagnosis not present

## 2016-05-07 DIAGNOSIS — D509 Iron deficiency anemia, unspecified: Secondary | ICD-10-CM | POA: Diagnosis not present

## 2016-05-07 DIAGNOSIS — R112 Nausea with vomiting, unspecified: Secondary | ICD-10-CM | POA: Diagnosis not present

## 2016-05-07 DIAGNOSIS — A419 Sepsis, unspecified organism: Secondary | ICD-10-CM | POA: Diagnosis not present

## 2016-05-07 DIAGNOSIS — E1140 Type 2 diabetes mellitus with diabetic neuropathy, unspecified: Secondary | ICD-10-CM | POA: Diagnosis not present

## 2016-05-07 DIAGNOSIS — R739 Hyperglycemia, unspecified: Secondary | ICD-10-CM | POA: Diagnosis not present

## 2016-05-07 DIAGNOSIS — D649 Anemia, unspecified: Secondary | ICD-10-CM | POA: Diagnosis not present

## 2016-05-07 DIAGNOSIS — N189 Chronic kidney disease, unspecified: Secondary | ICD-10-CM | POA: Diagnosis not present

## 2016-05-07 DIAGNOSIS — N183 Chronic kidney disease, stage 3 (moderate): Secondary | ICD-10-CM | POA: Diagnosis not present

## 2016-05-07 DIAGNOSIS — R079 Chest pain, unspecified: Secondary | ICD-10-CM | POA: Diagnosis not present

## 2016-05-07 DIAGNOSIS — E0865 Diabetes mellitus due to underlying condition with hyperglycemia: Secondary | ICD-10-CM | POA: Diagnosis not present

## 2016-05-30 ENCOUNTER — Telehealth: Payer: Self-pay | Admitting: Family Medicine

## 2016-06-09 DIAGNOSIS — N3000 Acute cystitis without hematuria: Secondary | ICD-10-CM | POA: Diagnosis not present

## 2016-06-09 DIAGNOSIS — R319 Hematuria, unspecified: Secondary | ICD-10-CM | POA: Diagnosis not present

## 2016-06-09 DIAGNOSIS — Z79899 Other long term (current) drug therapy: Secondary | ICD-10-CM | POA: Diagnosis not present

## 2016-06-09 DIAGNOSIS — R1030 Lower abdominal pain, unspecified: Secondary | ICD-10-CM | POA: Diagnosis not present

## 2016-06-09 DIAGNOSIS — I10 Essential (primary) hypertension: Secondary | ICD-10-CM | POA: Diagnosis not present

## 2016-06-09 DIAGNOSIS — R109 Unspecified abdominal pain: Secondary | ICD-10-CM | POA: Diagnosis not present

## 2016-06-09 DIAGNOSIS — N390 Urinary tract infection, site not specified: Secondary | ICD-10-CM | POA: Diagnosis not present

## 2016-06-09 DIAGNOSIS — E119 Type 2 diabetes mellitus without complications: Secondary | ICD-10-CM | POA: Diagnosis not present

## 2016-06-10 DIAGNOSIS — E1122 Type 2 diabetes mellitus with diabetic chronic kidney disease: Secondary | ICD-10-CM | POA: Diagnosis not present

## 2016-06-10 DIAGNOSIS — R109 Unspecified abdominal pain: Secondary | ICD-10-CM | POA: Diagnosis not present

## 2016-06-10 DIAGNOSIS — N3090 Cystitis, unspecified without hematuria: Secondary | ICD-10-CM | POA: Diagnosis not present

## 2016-06-10 DIAGNOSIS — Z794 Long term (current) use of insulin: Secondary | ICD-10-CM | POA: Diagnosis not present

## 2016-06-10 DIAGNOSIS — N3000 Acute cystitis without hematuria: Secondary | ICD-10-CM | POA: Diagnosis not present

## 2016-06-10 DIAGNOSIS — Z79899 Other long term (current) drug therapy: Secondary | ICD-10-CM | POA: Diagnosis not present

## 2016-06-10 DIAGNOSIS — N189 Chronic kidney disease, unspecified: Secondary | ICD-10-CM | POA: Diagnosis not present

## 2016-06-10 DIAGNOSIS — I129 Hypertensive chronic kidney disease with stage 1 through stage 4 chronic kidney disease, or unspecified chronic kidney disease: Secondary | ICD-10-CM | POA: Diagnosis not present

## 2016-06-10 DIAGNOSIS — Z87442 Personal history of urinary calculi: Secondary | ICD-10-CM | POA: Diagnosis not present

## 2016-06-10 DIAGNOSIS — I10 Essential (primary) hypertension: Secondary | ICD-10-CM | POA: Diagnosis not present

## 2016-06-10 DIAGNOSIS — R1030 Lower abdominal pain, unspecified: Secondary | ICD-10-CM | POA: Diagnosis not present

## 2016-07-17 DIAGNOSIS — J9811 Atelectasis: Secondary | ICD-10-CM | POA: Diagnosis not present

## 2016-07-17 DIAGNOSIS — N390 Urinary tract infection, site not specified: Secondary | ICD-10-CM | POA: Diagnosis not present

## 2016-07-17 DIAGNOSIS — R0602 Shortness of breath: Secondary | ICD-10-CM | POA: Diagnosis not present

## 2016-07-17 DIAGNOSIS — I10 Essential (primary) hypertension: Secondary | ICD-10-CM | POA: Diagnosis not present

## 2016-07-17 DIAGNOSIS — R319 Hematuria, unspecified: Secondary | ICD-10-CM | POA: Diagnosis not present

## 2016-07-17 DIAGNOSIS — R7989 Other specified abnormal findings of blood chemistry: Secondary | ICD-10-CM | POA: Diagnosis not present

## 2016-07-17 DIAGNOSIS — E1165 Type 2 diabetes mellitus with hyperglycemia: Secondary | ICD-10-CM | POA: Diagnosis not present

## 2016-07-17 DIAGNOSIS — R1110 Vomiting, unspecified: Secondary | ICD-10-CM | POA: Diagnosis not present

## 2016-07-18 DIAGNOSIS — R0602 Shortness of breath: Secondary | ICD-10-CM | POA: Diagnosis not present

## 2016-07-18 DIAGNOSIS — I10 Essential (primary) hypertension: Secondary | ICD-10-CM | POA: Diagnosis not present

## 2016-07-18 DIAGNOSIS — R319 Hematuria, unspecified: Secondary | ICD-10-CM | POA: Diagnosis not present

## 2016-07-18 DIAGNOSIS — N189 Chronic kidney disease, unspecified: Secondary | ICD-10-CM | POA: Diagnosis not present

## 2016-07-18 DIAGNOSIS — E1165 Type 2 diabetes mellitus with hyperglycemia: Secondary | ICD-10-CM | POA: Diagnosis not present

## 2016-07-18 DIAGNOSIS — N390 Urinary tract infection, site not specified: Secondary | ICD-10-CM | POA: Diagnosis not present

## 2016-07-18 DIAGNOSIS — D649 Anemia, unspecified: Secondary | ICD-10-CM | POA: Diagnosis not present

## 2016-07-18 DIAGNOSIS — E1140 Type 2 diabetes mellitus with diabetic neuropathy, unspecified: Secondary | ICD-10-CM | POA: Diagnosis not present

## 2016-07-18 DIAGNOSIS — E1122 Type 2 diabetes mellitus with diabetic chronic kidney disease: Secondary | ICD-10-CM | POA: Diagnosis not present

## 2016-07-18 DIAGNOSIS — J9811 Atelectasis: Secondary | ICD-10-CM | POA: Diagnosis not present

## 2016-07-18 DIAGNOSIS — B9620 Unspecified Escherichia coli [E. coli] as the cause of diseases classified elsewhere: Secondary | ICD-10-CM | POA: Diagnosis not present

## 2016-07-18 DIAGNOSIS — Z1630 Resistance to unspecified antimicrobial drugs: Secondary | ICD-10-CM | POA: Diagnosis not present

## 2016-07-18 DIAGNOSIS — R1110 Vomiting, unspecified: Secondary | ICD-10-CM | POA: Diagnosis not present

## 2016-07-18 DIAGNOSIS — B961 Klebsiella pneumoniae [K. pneumoniae] as the cause of diseases classified elsewhere: Secondary | ICD-10-CM | POA: Diagnosis not present

## 2016-07-18 DIAGNOSIS — I129 Hypertensive chronic kidney disease with stage 1 through stage 4 chronic kidney disease, or unspecified chronic kidney disease: Secondary | ICD-10-CM | POA: Diagnosis not present

## 2016-07-21 DIAGNOSIS — N390 Urinary tract infection, site not specified: Secondary | ICD-10-CM | POA: Diagnosis not present

## 2016-07-26 ENCOUNTER — Telehealth: Payer: Self-pay | Admitting: Family Medicine

## 2016-08-14 ENCOUNTER — Encounter (HOSPITAL_COMMUNITY): Payer: Self-pay | Admitting: Emergency Medicine

## 2016-08-14 ENCOUNTER — Emergency Department (HOSPITAL_COMMUNITY)
Admission: EM | Admit: 2016-08-14 | Discharge: 2016-08-14 | Disposition: A | Discharge status: Home / Self Care | Payer: Medicare HMO | Attending: Emergency Medicine | Admitting: Emergency Medicine

## 2016-08-14 DIAGNOSIS — N390 Urinary tract infection, site not specified: Secondary | ICD-10-CM | POA: Diagnosis not present

## 2016-08-14 DIAGNOSIS — I130 Hypertensive heart and chronic kidney disease with heart failure and stage 1 through stage 4 chronic kidney disease, or unspecified chronic kidney disease: Secondary | ICD-10-CM | POA: Insufficient documentation

## 2016-08-14 DIAGNOSIS — E1122 Type 2 diabetes mellitus with diabetic chronic kidney disease: Secondary | ICD-10-CM | POA: Diagnosis not present

## 2016-08-14 DIAGNOSIS — I509 Heart failure, unspecified: Secondary | ICD-10-CM | POA: Diagnosis not present

## 2016-08-14 DIAGNOSIS — R300 Dysuria: Secondary | ICD-10-CM | POA: Diagnosis present

## 2016-08-14 DIAGNOSIS — N189 Chronic kidney disease, unspecified: Secondary | ICD-10-CM | POA: Diagnosis not present

## 2016-08-14 LAB — CBC
HEMATOCRIT: 29.8 % — AB (ref 36.0–46.0)
Hemoglobin: 9.6 g/dL — ABNORMAL LOW (ref 12.0–15.0)
MCH: 26.2 pg (ref 26.0–34.0)
MCHC: 32.2 g/dL (ref 30.0–36.0)
MCV: 81.4 fL (ref 78.0–100.0)
Platelets: 363 10*3/uL (ref 150–400)
RBC: 3.66 MIL/uL — ABNORMAL LOW (ref 3.87–5.11)
RDW: 14 % (ref 11.5–15.5)
WBC: 9.1 10*3/uL (ref 4.0–10.5)

## 2016-08-14 LAB — URINALYSIS, ROUTINE W REFLEX MICROSCOPIC
Bilirubin Urine: NEGATIVE
Glucose, UA: NEGATIVE mg/dL
KETONES UR: NEGATIVE mg/dL
NITRITE: NEGATIVE
PH: 6 (ref 5.0–8.0)
Specific Gravity, Urine: >1.03 — ABNORMAL HIGH (ref 1.005–1.030)

## 2016-08-14 LAB — COMPREHENSIVE METABOLIC PANEL
ALT: 14 U/L (ref 14–54)
ANION GAP: 9 (ref 5–15)
AST: 17 U/L (ref 15–41)
Albumin: 3.3 g/dL — ABNORMAL LOW (ref 3.5–5.0)
Alkaline Phosphatase: 74 U/L (ref 38–126)
BUN: 23 mg/dL — AB (ref 6–20)
CALCIUM: 8.9 mg/dL (ref 8.9–10.3)
CO2: 20 mmol/L — ABNORMAL LOW (ref 22–32)
Chloride: 109 mmol/L (ref 101–111)
Creatinine, Ser: 2.04 mg/dL — ABNORMAL HIGH (ref 0.44–1.00)
GFR calc Af Amer: 27 mL/min — ABNORMAL LOW (ref >60)
GFR, EST NON AFRICAN AMERICAN: 23 mL/min — AB (ref >60)
Glucose, Bld: 225 mg/dL — ABNORMAL HIGH (ref 65–99)
POTASSIUM: 3.4 mmol/L — AB (ref 3.5–5.1)
Sodium: 138 mmol/L (ref 135–145)
TOTAL PROTEIN: 7.8 g/dL (ref 6.5–8.1)
Total Bilirubin: 0.2 mg/dL — ABNORMAL LOW (ref 0.3–1.2)

## 2016-08-14 LAB — URINALYSIS, MICROSCOPIC (REFLEX)

## 2016-08-14 MED ORDER — SODIUM CHLORIDE 0.9 % IV BOLUS (SEPSIS)
1000.0000 mL | Freq: Once | INTRAVENOUS | Status: AC
  Administered 2016-08-14: 1000 mL via INTRAVENOUS

## 2016-08-14 MED ORDER — CEPHALEXIN 500 MG PO CAPS: 500.0000 mg | ORAL_CAPSULE | Freq: Four times a day (QID) | ORAL | 0 refills | Status: DC

## 2016-08-14 MED ORDER — DEXTROSE 5 % IV SOLN
1.0000 g | Freq: Once | INTRAVENOUS | Status: AC
  Administered 2016-08-14: 1 g via INTRAVENOUS
  Filled 2016-08-14: qty 10

## 2016-08-14 MED ORDER — PHENAZOPYRIDINE HCL 100 MG PO TABS
200.0000 mg | ORAL_TABLET | Freq: Once | ORAL | Status: AC
  Administered 2016-08-14: 200 mg via ORAL
  Filled 2016-08-14: qty 2

## 2016-08-14 MED ORDER — ACETAMINOPHEN 325 MG PO TABS
650.0000 mg | ORAL_TABLET | Freq: Once | ORAL | Status: AC
  Administered 2016-08-14: 650 mg via ORAL
  Filled 2016-08-14: qty 2

## 2016-08-14 NOTE — ED Triage Notes (Signed)
Pt to ER for evaluation of two day hx of vaginal pain with urination. Also two days of diarrhea. States "I swear I feel like my bladder has dropped down into my vagina." states hysterectomy in the past. Significant hx of UTI's. A/o x4.

## 2016-08-14 NOTE — Discharge Instructions (Signed)
It was our pleasure to provide your ER care today - we hope that you feel better.  Rest. Drink plenty of fluids.   Take keflex (antibiotic) as prescribed.  Follow up with your doctor in the next few days for recheck - call office to arrange appointment.   From today's lab tests, your kidney function is mildly higher than prior (creatinine 2) - discuss with your doctor at follow up this week.  Also, avoid  taking any anti-inflammatory type pain medication such as ibuprofen or naprosyn.   Return to ER if worse, new symptoms, vomiting, fevers, weak/faint, severe pain, other concern.

## 2016-08-14 NOTE — ED Provider Notes (Addendum)
[facility] DEPT Provider Note   CSN: [id number] Arrival date & time: 08/14/16  [place]     History   Chief Complaint Chief Complaint  Patient presents with  . Dysuria    HPI Ruth Douglas is a 76 y.o. female.  Patient c/o urinary frequency, urgency, and dysuria for the past two days. Symptoms acute onset, moderate, persistent, worse today.  Hx uti, feels similar. No vomiting or diarrhea. Denies abdominal or flank pain. No fever or chills.    The history is provided by the patient.  Vaginal Pain  Pertinent negatives include no chest pain, no abdominal pain, no headaches and no shortness of breath.    Past Medical History:  Diagnosis Date  . Blood transfusion without reported diagnosis years ago  . CHF (congestive heart failure) ([place]) years ago   no current cardiologist, pt does not remember having chf  . Diabetes mellitus without complication ([place])   . History of kidney stones    x 2 in past  . Hypertension   . UTI (lower urinary tract infection)     Patient Active Problem List   Diagnosis Date Noted  . Nephrolithiasis 08/08/2014  . Essential hypertension 06/19/2014  . Anemia 06/18/2014  . CKD (chronic kidney disease) 05/31/2012  . DVT (deep venous thrombosis) ([place]) 05/31/2012  . Hyperlipidemia 05/31/2012  . DM2 (diabetes mellitus, type 2) ([place]) 01/15/2012  . Diabetic foot ulcer ([place]) 01/15/2012    Past Surgical History:  Procedure Laterality Date  . ABDOMINAL HYSTERECTOMY  08/2014   [facility]  . ACHILLES TENDON SURGERY Left 06/01/2012   Procedure: PERCUTANEOUS TENDON ACHILLES [place];  Surgeon: Wylene Simmer, MD;  Location: [facility];  Service: Orthopedics;  Laterality: Left;  . AMPUTATION Left 06/01/2012   Procedure: LEFT FOOT TRANSMETATARSEL AMPUTATION;  Surgeon: Wylene Simmer, MD;  Location: [facility];  Service: Orthopedics;  Laterality: Left;  . AMPUTATION Right 06/20/2014   Procedure: AMPUTATION FOOT;  Surgeon: Newt Minion, MD;  Location: [facility];  Service:  Orthopedics;  Laterality: Right;  . CESAREAN SECTION    . CYSTOSCOPY W/ URETERAL STENT PLACEMENT Left 08/08/2014   Procedure: CYSTOSCOPY WITH RETROGRADE PYELOGRAM/URETERAL STENT PLACEMENT;  Surgeon: Festus Aloe, MD;  Location: [facility];  Service: Urology;  Laterality: Left;  . CYSTOSCOPY WITH RETROGRADE PYELOGRAM, URETEROSCOPY AND STENT PLACEMENT Left 09/05/2014   Procedure: CYSTO, LEFT RETROGRADE PYELOGRAM, LEFT URETEROSCOPY AND STENT PLACEMENT;  Surgeon: Festus Aloe, MD;  [facility];  Service: Urology;  Laterality: Left;  . CYSTOSCOPY WITH STENT PLACEMENT  01/16/2012   Procedure: CYSTOSCOPY WITH STENT PLACEMENT;  Surgeon: Dutch Gray, MD;  [facility];  Service: Urology;  Laterality: Left;  cysto, retrograde , ureteroscopy , stone extraction with laser lithotripsy, stent placement on left, bladder biopsies with fulgaration  . EYE SURGERY Bilateral    lens replacements for cataracts  . HOLMIUM LASER APPLICATION  62/83/1517   Procedure: HOLMIUM LASER APPLICATION;  Surgeon: Dutch Gray, MD;  [facility];  Service: Urology;  Laterality: Left;  . HOLMIUM LASER APPLICATION Left 07/30/6071   Procedure: LEFT HOLMIUM LASER APPLICATION;  Surgeon: Festus Aloe, MD;  [facility];  Service: Urology;  Laterality: Left;  . TOE AMPUTATION      OB History    No data available       Home Medications    Prior to Admission medications   Medication Sig Start Date End Date Taking? Authorizing Provider  acetaminophen (TYLENOL) 500 MG tablet Take 1,000 mg by mouth every 6 (six) hours as  needed for mild pain (pain). Reported on 08/13/2015    [provider]  amLODipine (NORVASC) 5 MG tablet Take 5 mg by mouth every morning.  06/24/14   [provider]  CRANBERRY PO Take 1 capsule by mouth every morning. Reported on 08/13/2015    [provider]  TRESIBA FLEXTOUCH 100 UNIT/ML SOPN FlexTouch Pen INJECT 18 UNITS INTO THE SKIN AT BEDTIME. 02/15/16   Dettinger,  Fransisca Kaufmann, MD    Family History Family History  Problem Relation Age of Onset  . Diabetes Mellitus II Mother   . Diabetes Mellitus II Father   . Lung cancer Sister   . Kidney cancer Brother     Social History Social History  Substance Use Topics  . Smoking status: Never Smoker  . Smokeless tobacco: Never Used  . Alcohol use No     Allergies   Patient has no known allergies.   Review of Systems Review of Systems  Constitutional: Negative for fever.  HENT: Negative for sore throat.   Eyes: Negative for redness.  Respiratory: Negative for shortness of breath.   Cardiovascular: Negative for chest pain.  Gastrointestinal: Negative for abdominal pain and vomiting.  Genitourinary: Positive for dysuria. Negative for flank pain and vaginal discharge.  Musculoskeletal: Negative for back pain.  Skin: Negative for rash.  Neurological: Negative for headaches.  Hematological: Does not bruise/bleed easily.  Psychiatric/Behavioral: Negative for confusion.     Physical Exam Updated Vital Signs BP (!) 186/81 (BP Location: Right Arm)   Pulse 92   Temp 98.7 F (37.1 C) (Oral)   Resp 16   SpO2 99%   Physical Exam  Constitutional: She appears well-developed and well-nourished. No distress.  HENT:  Mouth/Throat: Oropharynx is clear and moist.  Eyes: Conjunctivae are normal. No scleral icterus.  Neck: Neck supple. No tracheal deviation present.  Cardiovascular: Normal rate, regular rhythm, normal heart sounds and intact distal pulses.   Pulmonary/Chest: Effort normal and breath sounds normal. No respiratory distress.  Abdominal: Soft. Normal appearance. She exhibits no distension. There is no tenderness.  Genitourinary:  Genitourinary Comments: No cva tenderness.  Musculoskeletal: She exhibits no edema.  Neurological: She is alert.  Skin: Skin is warm and dry. No rash noted. She is not diaphoretic.  Psychiatric: She has a normal mood and affect.  Nursing note and vitals  reviewed.    ED Treatments / Results  Labs (all labs ordered are listed, but only abnormal results are displayed) Results for orders placed or performed during the hospital encounter of 08/14/16  Comprehensive metabolic panel  Result Value Ref Range   Sodium 138 135 - 145 mmol/L   Potassium 3.4 (L) 3.5 - 5.1 mmol/L   Chloride 109 101 - 111 mmol/L   CO2 20 (L) 22 - 32 mmol/L   Glucose, Bld 225 (H) 65 - 99 mg/dL   BUN 23 (H) 6 - 20 mg/dL   Creatinine, Ser 2.04 (H) 0.44 - 1.00 mg/dL   Calcium 8.9 8.9 - 10.3 mg/dL   Total Protein 7.8 6.5 - 8.1 g/dL   Albumin 3.3 (L) 3.5 - 5.0 g/dL   AST 17 15 - 41 U/L   ALT 14 14 - 54 U/L   Alkaline Phosphatase 74 38 - 126 U/L   Total Bilirubin 0.2 (L) 0.3 - 1.2 mg/dL   GFR calc non Af Amer 23 (L) >60 mL/min   GFR calc Af Amer 27 (L) >60 mL/min   Anion gap 9 5 - 15  CBC  Result Value Ref Range   WBC 9.1 4.0 - 10.5 K/uL   RBC 3.66 (L) 3.87 - 5.11 MIL/uL   Hemoglobin 9.6 (L) 12.0 - 15.0 g/dL   HCT 29.8 (L) 36.0 - 46.0 %   MCV 81.4 78.0 - 100.0 fL   MCH 26.2 26.0 - 34.0 pg   MCHC 32.2 30.0 - 36.0 g/dL   RDW 14.0 11.5 - 15.5 %   Platelets 363 150 - 400 K/uL  Urinalysis, Routine w reflex microscopic  Result Value Ref Range   Color, Urine YELLOW YELLOW   APPearance TURBID (A) CLEAR   Specific Gravity, Urine >1.030 (H) 1.005 - 1.030   pH 6.0 5.0 - 8.0   Glucose, UA NEGATIVE NEGATIVE mg/dL   Hgb urine dipstick LARGE (A) NEGATIVE   Bilirubin Urine NEGATIVE NEGATIVE   Ketones, ur NEGATIVE NEGATIVE mg/dL   Protein, ur >300 (A) NEGATIVE mg/dL   Nitrite NEGATIVE NEGATIVE   Leukocytes, UA MODERATE (A) NEGATIVE  Urinalysis, Microscopic (reflex)  Result Value Ref Range   RBC / HPF TOO NUMEROUS TO COUNT 0 - 5 RBC/hpf   WBC, UA TOO NUMEROUS TO COUNT 0 - 5 WBC/hpf   Bacteria, UA MANY (A) NONE SEEN   Squamous Epithelial / LPF 0-5 (A) NONE SEEN    EKG  EKG Interpretation None       Radiology No results found.  Procedures Procedures  (including critical care time)  Medications Ordered in ED Medications  sodium chloride 0.9 % bolus 1,000 mL (not administered)  cefTRIAXone (ROCEPHIN) 1 g in dextrose 5 % 50 mL IVPB (not administered)  acetaminophen (TYLENOL) tablet 650 mg (not administered)  phenazopyridine (PYRIDIUM) tablet 200 mg (not administered)     Initial Impression / Assessment and Plan / ED Course  I have reviewed the triage vital signs and the nursing notes.  Pertinent labs & imaging results that were available during my care of the patient were reviewed by me and considered in my medical decision making (see chart for details).  Labs sent.  uti on labs. Culture sent. Rocephin iv.  Hx CRI, creatinine higher than previous. Ivf bolus.   Po fluids. No vomiting. abd soft nt.  Pt currently appears stable for d/c.     Final Clinical Impressions(s) / ED Diagnoses   Final diagnoses:  None    New Prescriptions New Prescriptions   No medications on file        Lajean Saver, MD 08/14/16 2038

## 2016-08-14 NOTE — ED Notes (Signed)
ED Provider at bedside. 

## 2016-08-14 NOTE — ED Notes (Signed)
E-signature not available, pt verbalized understanding of DC instructions and prescriptions 

## 2016-08-17 LAB — URINE CULTURE

## 2016-08-18 ENCOUNTER — Telehealth: Payer: Self-pay | Admitting: *Deleted

## 2016-08-18 DIAGNOSIS — Z136 Encounter for screening for cardiovascular disorders: Secondary | ICD-10-CM | POA: Diagnosis not present

## 2016-08-18 DIAGNOSIS — Z794 Long term (current) use of insulin: Secondary | ICD-10-CM | POA: Diagnosis not present

## 2016-08-18 DIAGNOSIS — I10 Essential (primary) hypertension: Secondary | ICD-10-CM | POA: Diagnosis not present

## 2016-08-18 DIAGNOSIS — Z713 Dietary counseling and surveillance: Secondary | ICD-10-CM | POA: Diagnosis not present

## 2016-08-18 DIAGNOSIS — Z7189 Other specified counseling: Secondary | ICD-10-CM | POA: Diagnosis not present

## 2016-08-18 DIAGNOSIS — R32 Unspecified urinary incontinence: Secondary | ICD-10-CM | POA: Diagnosis not present

## 2016-08-18 DIAGNOSIS — E1169 Type 2 diabetes mellitus with other specified complication: Secondary | ICD-10-CM | POA: Diagnosis not present

## 2016-08-18 NOTE — Telephone Encounter (Signed)
Post ED Visit - Positive Culture Follow-up: Unsuccessful Patient Follow-up  Culture assessed and recommendations reviewed by:  []  Elenor Quinones, Pharm.D. []  Heide Guile, Pharm.D., BCPS AQ-ID []  Parks Neptune, Pharm.D., BCPS []  Alycia Rossetti, Pharm.D., BCPS []  [place], Pharm.D., BCPS, AAHIVP []  Legrand Como, Pharm.D., BCPS, AAHIVP []  Salome Arnt, PharmD, BCPS []  Dimitri Ped, PharmD, BCPS []  Vincenza Hews, PharmD, BCPS  Positive urine culture  []  Patient discharged without antimicrobial prescription and treatment is now indicated [x]  Organism is resistant to prescribed ED discharge antimicrobial []  Patient with positive blood cultures   Unable to contact patient after 3 attempts, letter will be sent to address on file  Ardeen Fillers 08/18/2016, 10:24 AM

## 2016-08-18 NOTE — Progress Notes (Signed)
ED Antimicrobial Stewardship Positive Culture Follow Up   Ruth Douglas is an 76 y.o. female who presented to [facility] on 08/14/2016 with a chief complaint of  Chief Complaint  Patient presents with  . Dysuria    Recent Results (from the past 720 hour(s))  Urine Culture     Status: Abnormal   Collection Time: 08/14/16  6:46 PM  Result Value Ref Range Status   Specimen Description URINE, RANDOM  Final   Special Requests NONE  Final   Culture (A)  Final    >=100,000 COLONIES/mL ESCHERICHIA COLI Confirmed Extended Spectrum Beta-Lactamase Producer (ESBL)    Report Status 08/17/2016 FINAL  Final   Organism ID, Bacteria ESCHERICHIA COLI (A)  Final      Susceptibility   Escherichia coli - MIC*    AMPICILLIN >=32 RESISTANT Resistant     CEFAZOLIN >=64 RESISTANT Resistant     CEFTRIAXONE >=64 RESISTANT Resistant     CIPROFLOXACIN 1 SENSITIVE Sensitive     GENTAMICIN >=16 RESISTANT Resistant     IMIPENEM <=0.25 SENSITIVE Sensitive     NITROFURANTOIN <=16 SENSITIVE Sensitive     TRIMETH/SULFA >=320 RESISTANT Resistant     AMPICILLIN/SULBACTAM >=32 RESISTANT Resistant     PIP/TAZO >=128 RESISTANT Resistant     Extended ESBL POSITIVE Resistant     * >=100,000 COLONIES/mL ESCHERICHIA COLI    [x]  Treated with cephalexin, organism resistant to prescribed antimicrobial  New antibiotic prescription: Stop cephalexin. Start ciprofloxacin 250 mg PO BID x 5 days  ED Provider: Margarita Mail, PA-C   [name] 08/18/2016, 9:47 AM Infectious Diseases Pharmacist Phone# [phone]

## 2016-08-22 DIAGNOSIS — R69 Illness, unspecified: Secondary | ICD-10-CM | POA: Diagnosis not present

## 2016-08-23 DIAGNOSIS — E876 Hypokalemia: Secondary | ICD-10-CM | POA: Diagnosis not present

## 2016-08-23 DIAGNOSIS — N183 Chronic kidney disease, stage 3 (moderate): Secondary | ICD-10-CM | POA: Diagnosis not present

## 2016-08-23 DIAGNOSIS — E1169 Type 2 diabetes mellitus with other specified complication: Secondary | ICD-10-CM | POA: Diagnosis not present

## 2016-08-23 DIAGNOSIS — I10 Essential (primary) hypertension: Secondary | ICD-10-CM | POA: Diagnosis not present

## 2016-08-26 ENCOUNTER — Telehealth: Payer: Self-pay | Admitting: *Deleted

## 2016-08-26 NOTE — Telephone Encounter (Signed)
Contacted by patient in response to letter sent to address on file.  Advised to stop Cephalexin.  Ciprofloxacin 250 mg PO BID x 5 days called to [facility], [address], [place], [place]

## 2016-09-27 ENCOUNTER — Telehealth: Payer: Self-pay | Admitting: Family Medicine

## 2017-02-17 IMAGING — RF DG CYSTOGRAM 3+V
1 series · 3 of 3 positions shown · non-contrast
Comparison: CT abdomen pelvis dated 08/08/2014

CLINICAL DATA: Cystogram

EXAM:
CYSTOGRAM
TECHNIQUE: After catheterization of the urinary bladder following sterile
technique the bladder was filled with Cysto-Hypaque 30% by drip
infusion. Serial spot images were obtained during bladder filling
and post draining.
FLUOROSCOPY TIME:  3 minutes 26 seconds

[Series 1: run · 3 of 3 slices shown]
[im 1/3]
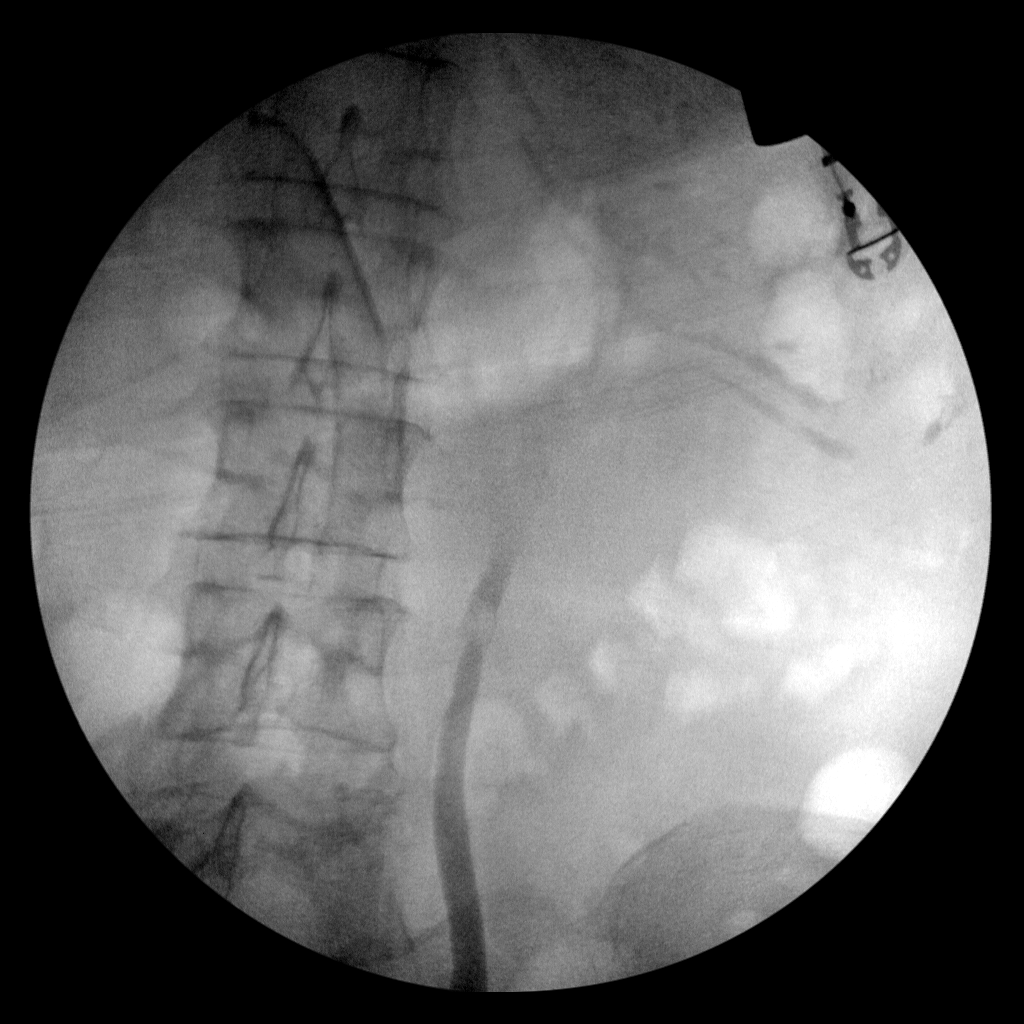
[im 2/3]
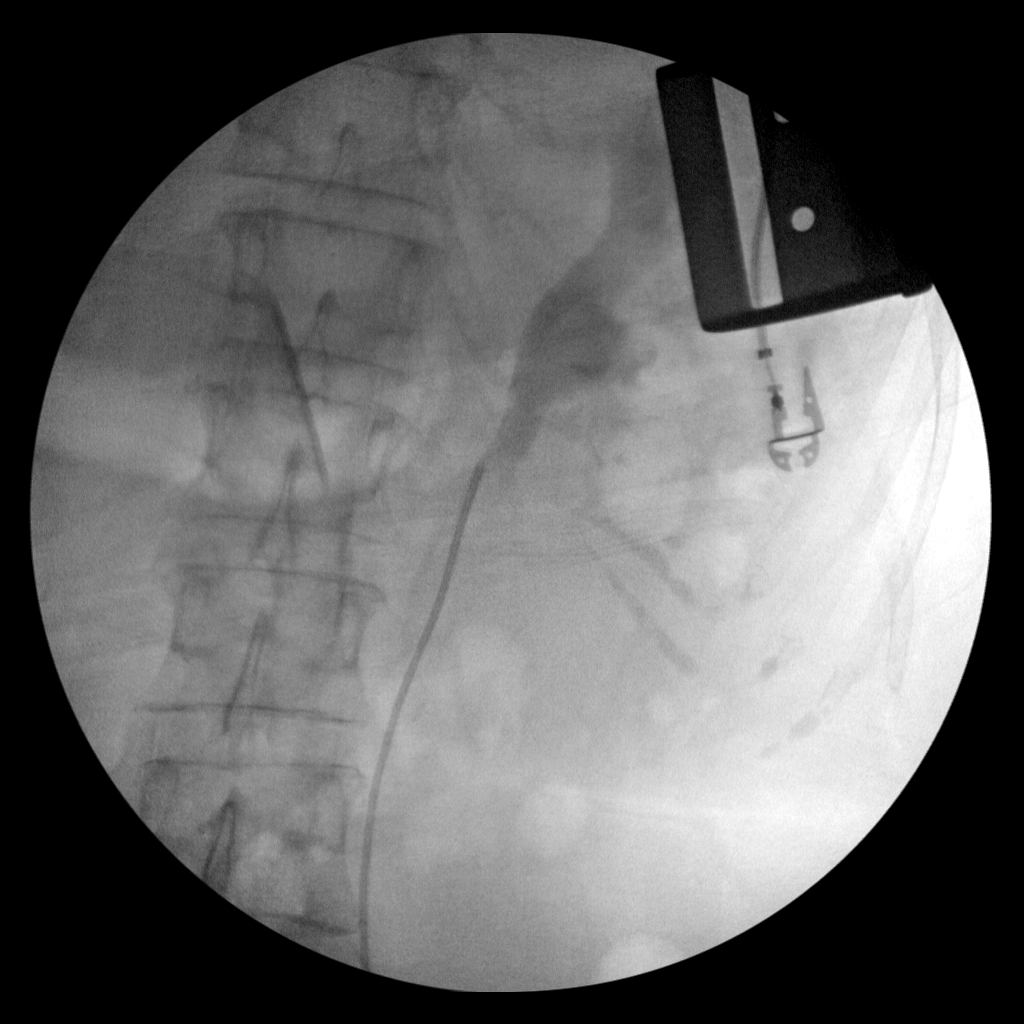
[im 3/3]
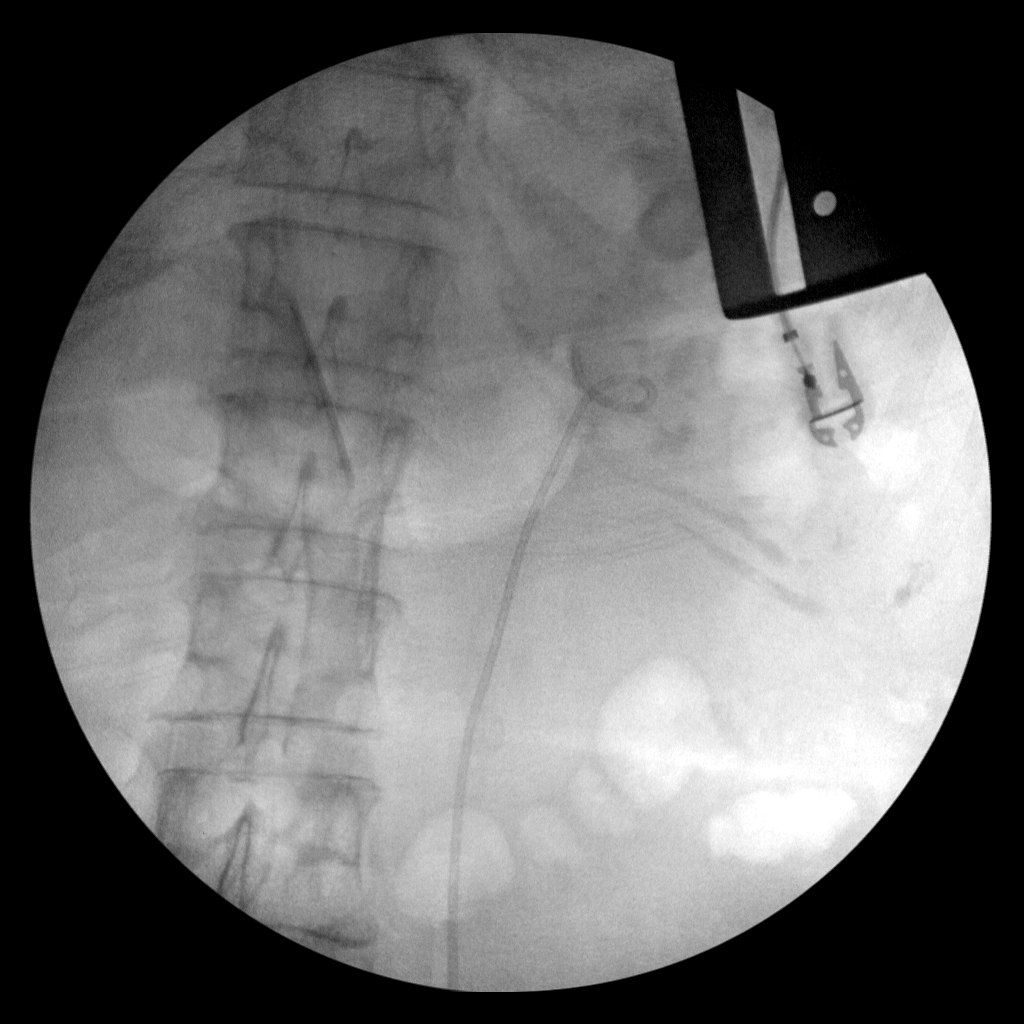

[3 of 3 positions shown; findings below may reference images not displayed]

FINDINGS: Fluoroscopic spot images during retrograde opacification of the left
ureter demonstrates a left ureteral calculus.

2nd spot image demonstrates a mildly dilated left renal collecting
system.

3rd image demonstrates a left ureteral stent.

Please refer to the referring physician's notes for additional
information regarding the procedure.
IMPRESSION: Fluoroscopic spot images during cystogram with left ureteral stent
placement.

## 2017-02-17 IMAGING — CT CT RENAL STONE PROTOCOL
2 of 4 series · 16 of 46 positions shown, 18 images · non-contrast
Comparison: 06/19/2014 ultrasound.  11/20/2012 CT

CLINICAL DATA: Acute left flank pain for 1 day. History kidney
stones. prior C-section and tubal ligation.

EXAM:
CT ABDOMEN AND PELVIS WITHOUT CONTRAST
TECHNIQUE: Multidetector CT imaging of the abdomen and pelvis was performed
following the standard protocol without IV contrast.

[Series 3: stone study 5.0 i30f 1 · axial · 0.76mm/px · z∈[+766,+1141]mm · 13 of 85 slices shown, 15 images]
[im 5/85  soft-tissue]
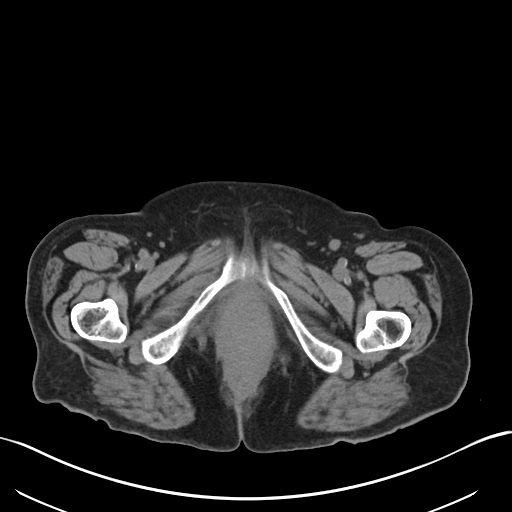
[im 5/85  bone]
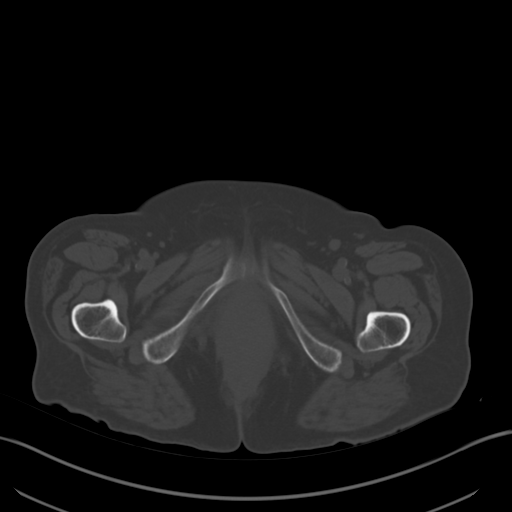
[im 13/85  soft-tissue]
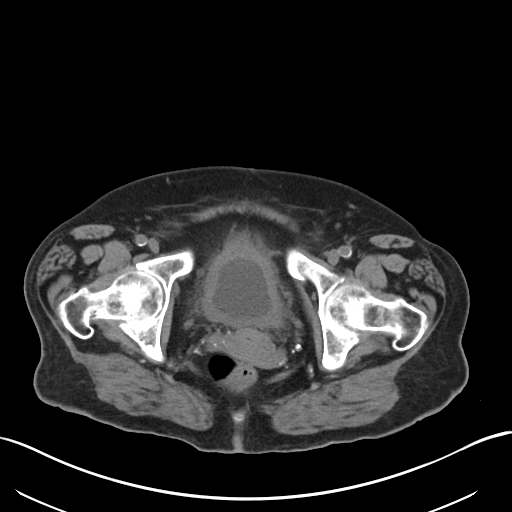
[im 17/85  soft-tissue]
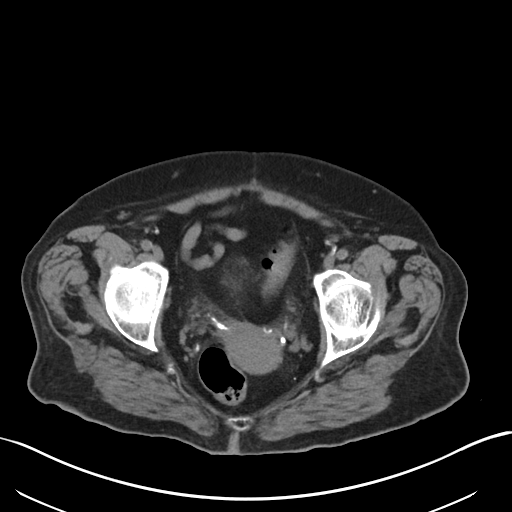
[im 26/85  soft-tissue]
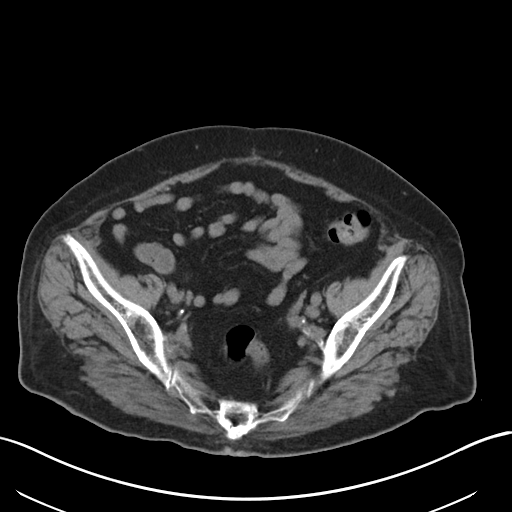
[im 30/85  soft-tissue]
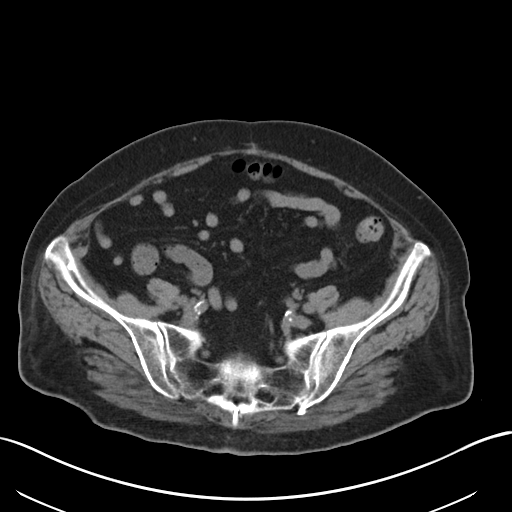
[im 38/85  soft-tissue]
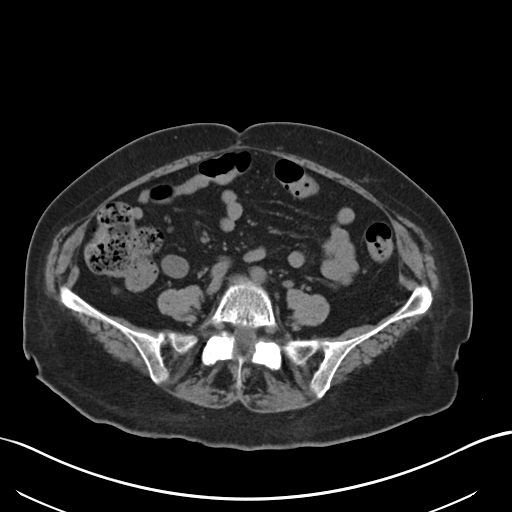
[im 43/85  soft-tissue]
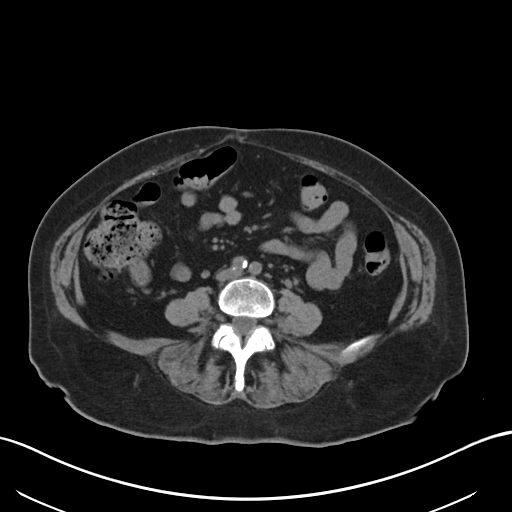
[im 47/85  soft-tissue]
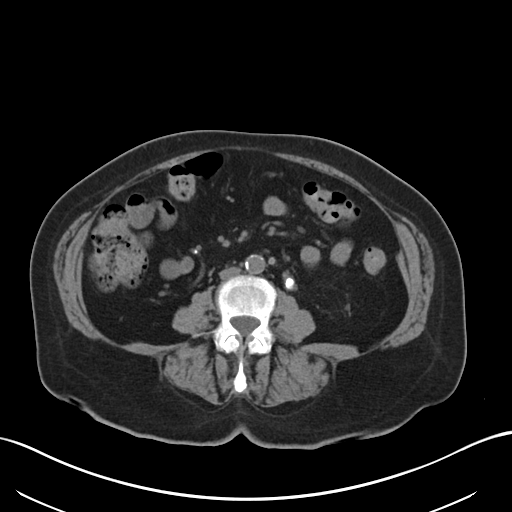
[im 55/85  soft-tissue]
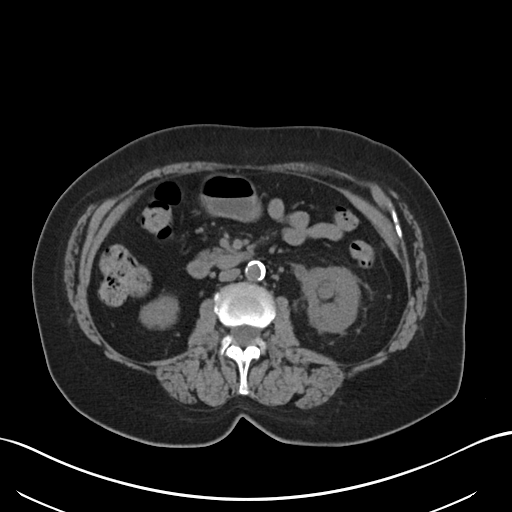
[im 55/85  bone]
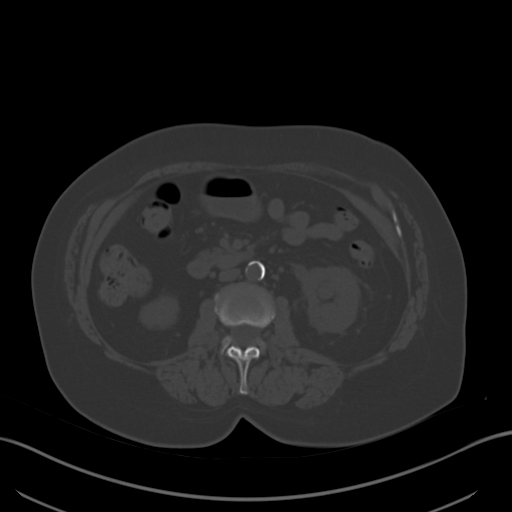
[im 59/85  soft-tissue]
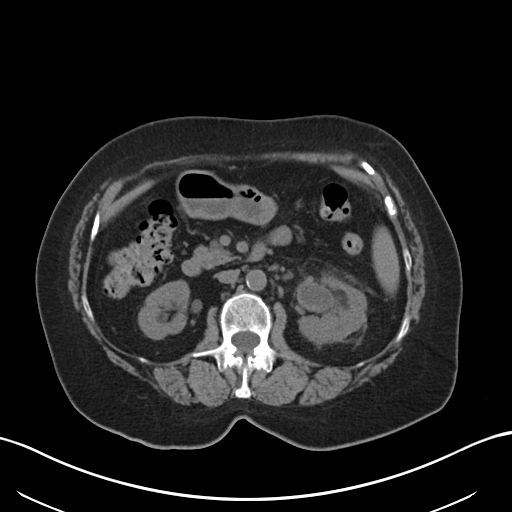
[im 68/85  soft-tissue]
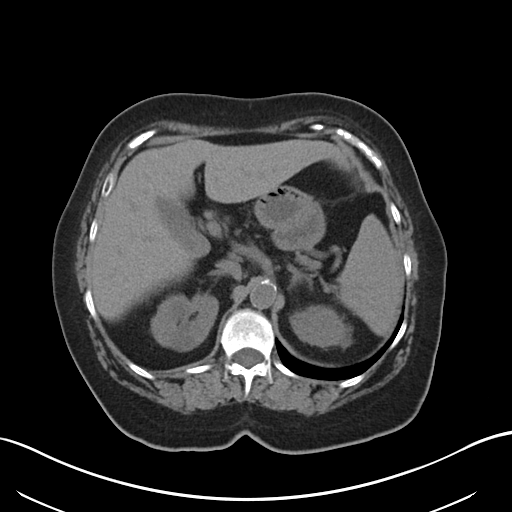
[im 72/85  soft-tissue]
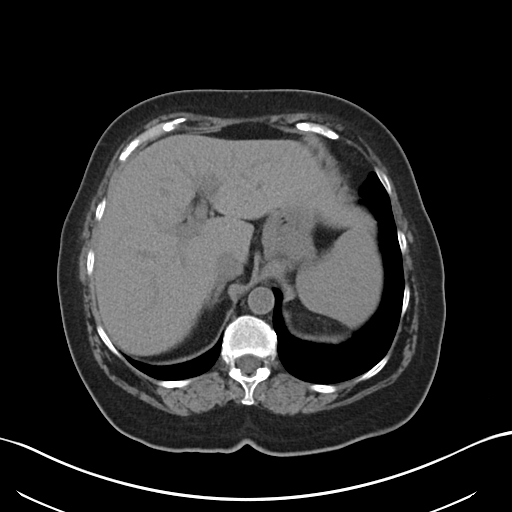
[im 80/85  soft-tissue]
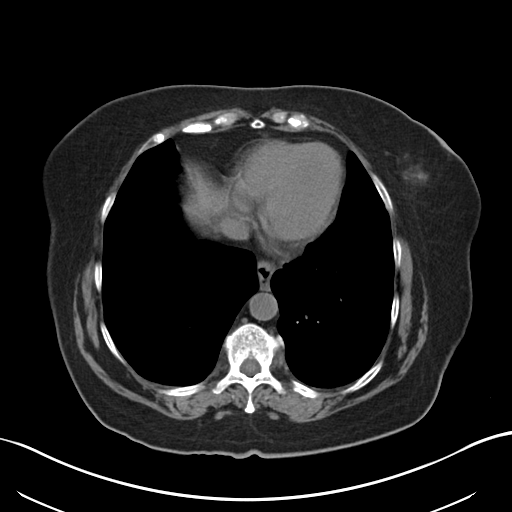

[Series 6: coronal soft tissue · coronal · 0.75mm/px · 3 of 98 slices shown]
[im 33/98  soft-tissue]
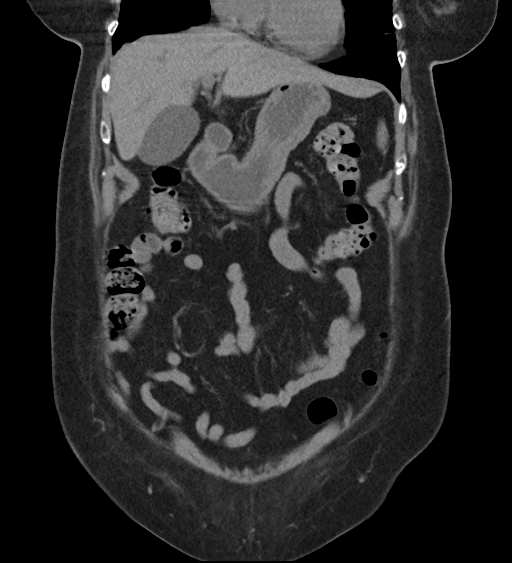
[im 44/98  soft-tissue]
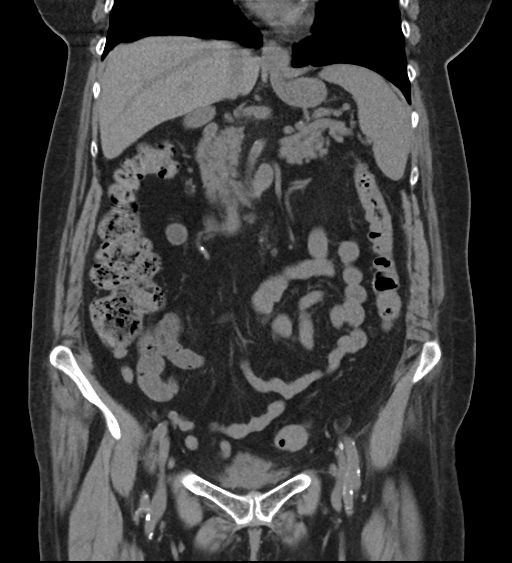
[im 54/98  soft-tissue]
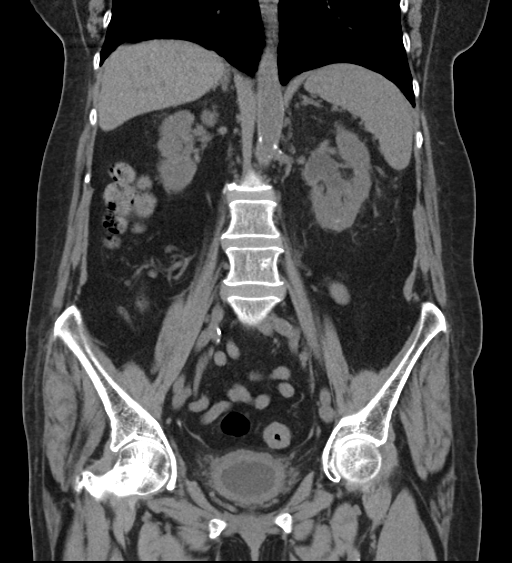

[16 of 46 positions shown; findings below may reference images not displayed]

FINDINGS: Lower chest: Mild motion degradation. Small hiatal hernia. Normal
heart size, without pericardial effusion. Multivessel coronary
artery atherosclerosis.

Hepatobiliary: Normal liver. Normal gallbladder, without biliary
ductal dilatation.

Pancreas: Normal, without mass or ductal dilatation.

Spleen: Normal

Adrenals/Urinary Tract: Normal adrenal glands. Renal cortical
thinning bilaterally. Punctate upper pole left renal collecting
system calculus. Upper pole right renal low-density lesions which
are likely cysts. There may be a second lower pole left renal
collecting system calculus. Moderate left-sided
hydroureteronephrosis, to the level of a mid left ureteric 8 x 9 mm
calculus. Moderate bladder wall thickening and surrounding edema.

Stomach/Bowel: Otherwise normal appearance of the stomach. Extensive
colonic diverticulosis. Normal terminal ileum and appendix. Normal
small bowel.

Vascular/Lymphatic: Aortic and branch vessel atherosclerosis. No
abdominopelvic adenopathy.

Reproductive: Dystrophic calcification in the uterus could relate to
an underlying fibroid. No adnexal mass.

Other: No significant free fluid.  Mild pelvic floor laxity.

Musculoskeletal: Osteopenia.  Multilevel disc bulges.
IMPRESSION: 1. Left-sided hydroureteronephrosis secondary to a 9 mm mid left
ureteric calculus.
2. Left nephrolithiasis.
3. Bladder wall thickening and surrounding edema, consistent with
cystitis.
4. Mild pelvic floor laxity.

## 2018-08-26 ENCOUNTER — Other Ambulatory Visit: Payer: Self-pay

## 2018-08-26 ENCOUNTER — Inpatient Hospital Stay (HOSPITAL_COMMUNITY): Payer: Medicare HMO

## 2018-08-26 ENCOUNTER — Inpatient Hospital Stay (HOSPITAL_COMMUNITY)
Admission: EM | Admit: 2018-08-26 | Discharge: 2018-09-29 | DRG: 808 | Disposition: E | Payer: Medicare HMO | Attending: Internal Medicine | Admitting: Internal Medicine

## 2018-08-26 ENCOUNTER — Encounter (HOSPITAL_COMMUNITY): Payer: Self-pay | Admitting: Surgery

## 2018-08-26 DIAGNOSIS — Z6831 Body mass index (BMI) 31.0-31.9, adult: Secondary | ICD-10-CM | POA: Diagnosis not present

## 2018-08-26 DIAGNOSIS — E119 Type 2 diabetes mellitus without complications: Secondary | ICD-10-CM

## 2018-08-26 DIAGNOSIS — E1165 Type 2 diabetes mellitus with hyperglycemia: Secondary | ICD-10-CM

## 2018-08-26 DIAGNOSIS — Z86718 Personal history of other venous thrombosis and embolism: Secondary | ICD-10-CM

## 2018-08-26 DIAGNOSIS — E871 Hypo-osmolality and hyponatremia: Secondary | ICD-10-CM | POA: Diagnosis present

## 2018-08-26 DIAGNOSIS — N179 Acute kidney failure, unspecified: Secondary | ICD-10-CM | POA: Diagnosis not present

## 2018-08-26 DIAGNOSIS — E669 Obesity, unspecified: Secondary | ICD-10-CM | POA: Diagnosis present

## 2018-08-26 DIAGNOSIS — Z7189 Other specified counseling: Secondary | ICD-10-CM

## 2018-08-26 DIAGNOSIS — Y95 Nosocomial condition: Secondary | ICD-10-CM | POA: Diagnosis present

## 2018-08-26 DIAGNOSIS — R1310 Dysphagia, unspecified: Secondary | ICD-10-CM | POA: Diagnosis not present

## 2018-08-26 DIAGNOSIS — J690 Pneumonitis due to inhalation of food and vomit: Secondary | ICD-10-CM | POA: Diagnosis present

## 2018-08-26 DIAGNOSIS — J9584 Transfusion-related acute lung injury (TRALI): Secondary | ICD-10-CM | POA: Diagnosis not present

## 2018-08-26 DIAGNOSIS — I361 Nonrheumatic tricuspid (valve) insufficiency: Secondary | ICD-10-CM | POA: Diagnosis not present

## 2018-08-26 DIAGNOSIS — Z20828 Contact with and (suspected) exposure to other viral communicable diseases: Secondary | ICD-10-CM | POA: Diagnosis present

## 2018-08-26 DIAGNOSIS — J9601 Acute respiratory failure with hypoxia: Secondary | ICD-10-CM | POA: Diagnosis not present

## 2018-08-26 DIAGNOSIS — I10 Essential (primary) hypertension: Secondary | ICD-10-CM | POA: Diagnosis present

## 2018-08-26 DIAGNOSIS — J189 Pneumonia, unspecified organism: Secondary | ICD-10-CM | POA: Diagnosis not present

## 2018-08-26 DIAGNOSIS — I5033 Acute on chronic diastolic (congestive) heart failure: Secondary | ICD-10-CM | POA: Diagnosis not present

## 2018-08-26 DIAGNOSIS — Z0000 Encounter for general adult medical examination without abnormal findings: Secondary | ICD-10-CM

## 2018-08-26 DIAGNOSIS — R110 Nausea: Secondary | ICD-10-CM | POA: Diagnosis not present

## 2018-08-26 DIAGNOSIS — D61818 Other pancytopenia: Secondary | ICD-10-CM | POA: Diagnosis present

## 2018-08-26 DIAGNOSIS — N183 Chronic kidney disease, stage 3 unspecified: Secondary | ICD-10-CM | POA: Diagnosis present

## 2018-08-26 DIAGNOSIS — N136 Pyonephrosis: Secondary | ICD-10-CM | POA: Diagnosis present

## 2018-08-26 DIAGNOSIS — J9811 Atelectasis: Secondary | ICD-10-CM | POA: Diagnosis not present

## 2018-08-26 DIAGNOSIS — Z87440 Personal history of urinary (tract) infections: Secondary | ICD-10-CM | POA: Diagnosis not present

## 2018-08-26 DIAGNOSIS — N3090 Cystitis, unspecified without hematuria: Secondary | ICD-10-CM | POA: Diagnosis not present

## 2018-08-26 DIAGNOSIS — Z515 Encounter for palliative care: Secondary | ICD-10-CM | POA: Diagnosis not present

## 2018-08-26 DIAGNOSIS — E1122 Type 2 diabetes mellitus with diabetic chronic kidney disease: Secondary | ICD-10-CM | POA: Diagnosis present

## 2018-08-26 DIAGNOSIS — N390 Urinary tract infection, site not specified: Secondary | ICD-10-CM | POA: Diagnosis not present

## 2018-08-26 DIAGNOSIS — Z66 Do not resuscitate: Secondary | ICD-10-CM | POA: Diagnosis not present

## 2018-08-26 DIAGNOSIS — R6510 Systemic inflammatory response syndrome (SIRS) of non-infectious origin without acute organ dysfunction: Secondary | ICD-10-CM | POA: Diagnosis not present

## 2018-08-26 DIAGNOSIS — I130 Hypertensive heart and chronic kidney disease with heart failure and stage 1 through stage 4 chronic kidney disease, or unspecified chronic kidney disease: Secondary | ICD-10-CM | POA: Diagnosis present

## 2018-08-26 DIAGNOSIS — Z89432 Acquired absence of left foot: Secondary | ICD-10-CM

## 2018-08-26 DIAGNOSIS — Z89431 Acquired absence of right foot: Secondary | ICD-10-CM

## 2018-08-26 DIAGNOSIS — Z794 Long term (current) use of insulin: Secondary | ICD-10-CM

## 2018-08-26 DIAGNOSIS — E876 Hypokalemia: Secondary | ICD-10-CM | POA: Diagnosis not present

## 2018-08-26 DIAGNOSIS — N170 Acute kidney failure with tubular necrosis: Secondary | ICD-10-CM | POA: Diagnosis present

## 2018-08-26 DIAGNOSIS — I340 Nonrheumatic mitral (valve) insufficiency: Secondary | ICD-10-CM | POA: Diagnosis not present

## 2018-08-26 DIAGNOSIS — D469 Myelodysplastic syndrome, unspecified: Secondary | ICD-10-CM | POA: Diagnosis present

## 2018-08-26 DIAGNOSIS — J80 Acute respiratory distress syndrome: Secondary | ICD-10-CM | POA: Diagnosis not present

## 2018-08-26 DIAGNOSIS — R0602 Shortness of breath: Secondary | ICD-10-CM | POA: Diagnosis not present

## 2018-08-26 DIAGNOSIS — D696 Thrombocytopenia, unspecified: Secondary | ICD-10-CM

## 2018-08-26 DIAGNOSIS — J181 Lobar pneumonia, unspecified organism: Secondary | ICD-10-CM | POA: Diagnosis not present

## 2018-08-26 DIAGNOSIS — Z87442 Personal history of urinary calculi: Secondary | ICD-10-CM | POA: Diagnosis not present

## 2018-08-26 DIAGNOSIS — Z8619 Personal history of other infectious and parasitic diseases: Secondary | ICD-10-CM

## 2018-08-26 DIAGNOSIS — Z79899 Other long term (current) drug therapy: Secondary | ICD-10-CM

## 2018-08-26 DIAGNOSIS — R1011 Right upper quadrant pain: Secondary | ICD-10-CM | POA: Diagnosis not present

## 2018-08-26 DIAGNOSIS — R300 Dysuria: Secondary | ICD-10-CM | POA: Diagnosis not present

## 2018-08-26 DIAGNOSIS — D649 Anemia, unspecified: Secondary | ICD-10-CM

## 2018-08-26 DIAGNOSIS — Z888 Allergy status to other drugs, medicaments and biological substances status: Secondary | ICD-10-CM

## 2018-08-26 DIAGNOSIS — K5900 Constipation, unspecified: Secondary | ICD-10-CM

## 2018-08-26 LAB — COMPREHENSIVE METABOLIC PANEL
ALT: 13 U/L (ref 0–44)
AST: 16 U/L (ref 15–41)
Albumin: 3.2 g/dL — ABNORMAL LOW (ref 3.5–5.0)
Alkaline Phosphatase: 45 U/L (ref 38–126)
Anion gap: 13 (ref 5–15)
BUN: 28 mg/dL — ABNORMAL HIGH (ref 8–23)
CO2: 19 mmol/L — ABNORMAL LOW (ref 22–32)
Calcium: 8.3 mg/dL — ABNORMAL LOW (ref 8.9–10.3)
Chloride: 101 mmol/L (ref 98–111)
Creatinine, Ser: 2.67 mg/dL — ABNORMAL HIGH (ref 0.44–1.00)
GFR calc Af Amer: 19 mL/min — ABNORMAL LOW (ref >60)
GFR calc non Af Amer: 17 mL/min — ABNORMAL LOW (ref >60)
Glucose, Bld: 176 mg/dL — ABNORMAL HIGH (ref 70–99)
Potassium: 3.7 mmol/L (ref 3.5–5.1)
Sodium: 133 mmol/L — ABNORMAL LOW (ref 135–145)
Total Bilirubin: 0.6 mg/dL (ref 0.3–1.2)
Total Protein: 7.3 g/dL (ref 6.5–8.1)

## 2018-08-26 LAB — CBC WITH DIFFERENTIAL/PLATELET
Abs Immature Granulocytes: 0.01 10*3/uL (ref 0.00–0.07)
Basophils Absolute: 0 10*3/uL (ref 0.0–0.1)
Basophils Relative: 1 %
Eosinophils Absolute: 0 10*3/uL (ref 0.0–0.5)
Eosinophils Relative: 0 %
HCT: 19.5 % — ABNORMAL LOW (ref 36.0–46.0)
Hemoglobin: 6.6 g/dL — CL (ref 12.0–15.0)
Immature Granulocytes: 1 %
Lymphocytes Relative: 49 %
Lymphs Abs: 0.3 10*3/uL — ABNORMAL LOW (ref 0.7–4.0)
MCH: 30.7 pg (ref 26.0–34.0)
MCHC: 33.8 g/dL (ref 30.0–36.0)
MCV: 90.7 fL (ref 80.0–100.0)
Monocytes Absolute: 0.1 10*3/uL (ref 0.1–1.0)
Monocytes Relative: 7 %
Neutro Abs: 0.3 10*3/uL — ABNORMAL LOW (ref 1.7–7.7)
Neutrophils Relative %: 42 %
Platelets: 28 10*3/uL — CL (ref 150–400)
RBC: 2.15 MIL/uL — ABNORMAL LOW (ref 3.87–5.11)
RDW: 14 % (ref 11.5–15.5)
WBC: 0.7 10*3/uL — CL (ref 4.0–10.5)
nRBC: 2.9 % — ABNORMAL HIGH (ref 0.0–0.2)

## 2018-08-26 LAB — SARS CORONAVIRUS 2 BY RT PCR (HOSPITAL ORDER, PERFORMED IN ~~LOC~~ HOSPITAL LAB): SARS Coronavirus 2: NEGATIVE

## 2018-08-26 LAB — TROPONIN I (HIGH SENSITIVITY)
Troponin I (High Sensitivity): 7 ng/L (ref <18)
Troponin I (High Sensitivity): 8 ng/L (ref <18)

## 2018-08-26 LAB — ABO/RH: ABO/RH(D): A POS

## 2018-08-26 LAB — LACTIC ACID, PLASMA: Lactic Acid, Venous: 1.1 mmol/L (ref 0.5–1.9)

## 2018-08-26 LAB — LACTATE DEHYDROGENASE: LDH: 398 U/L — ABNORMAL HIGH (ref 98–192)

## 2018-08-26 LAB — PREPARE RBC (CROSSMATCH)

## 2018-08-26 LAB — LIPASE, BLOOD: Lipase: 30 U/L (ref 11–51)

## 2018-08-26 MED ORDER — BISACODYL 10 MG RE SUPP: 10.0000 mg | Freq: Every day | RECTAL | Status: DC | PRN

## 2018-08-26 MED ORDER — ACETAMINOPHEN 325 MG PO TABS
650.0000 mg | ORAL_TABLET | Freq: Four times a day (QID) | ORAL | Status: DC | PRN
  Administered 2018-08-27 – 2018-09-02 (×6): 650 mg via ORAL
  Filled 2018-08-26 (×8): qty 2

## 2018-08-26 MED ORDER — VITAMIN B-12 100 MCG PO TABS
100.0000 ug | ORAL_TABLET | Freq: Every morning | ORAL | Status: DC
  Administered 2018-08-27 – 2018-09-02 (×7): 100 ug via ORAL
  Filled 2018-08-26 (×7): qty 1

## 2018-08-26 MED ORDER — TBO-FILGRASTIM 480 MCG/0.8ML ~~LOC~~ SOSY: 480.0000 ug | PREFILLED_SYRINGE | Freq: Once | SUBCUTANEOUS | Status: DC

## 2018-08-26 MED ORDER — ACETAMINOPHEN 650 MG RE SUPP
650.0000 mg | Freq: Four times a day (QID) | RECTAL | Status: DC | PRN
  Filled 2018-08-26 (×2): qty 1

## 2018-08-26 MED ORDER — AMLODIPINE BESYLATE 10 MG PO TABS
10.0000 mg | ORAL_TABLET | Freq: Every morning | ORAL | Status: DC
  Administered 2018-08-27 – 2018-08-30 (×4): 10 mg via ORAL
  Filled 2018-08-26 (×4): qty 1

## 2018-08-26 MED ORDER — SODIUM CHLORIDE 0.9 % IV BOLUS: 500.0000 mL | Freq: Once | INTRAVENOUS | Status: DC

## 2018-08-26 MED ORDER — SODIUM CHLORIDE 0.9 % IV SOLN
1.0000 g | INTRAVENOUS | Status: DC
  Administered 2018-08-26 – 2018-08-28 (×3): 1 g via INTRAVENOUS
  Filled 2018-08-26 (×4): qty 1

## 2018-08-26 MED ORDER — SODIUM CHLORIDE 0.9 % IV SOLN
INTRAVENOUS | Status: DC
  Administered 2018-08-26 – 2018-08-29 (×5): via INTRAVENOUS

## 2018-08-26 MED ORDER — ONDANSETRON HCL 4 MG/2ML IJ SOLN
4.0000 mg | Freq: Four times a day (QID) | INTRAMUSCULAR | Status: DC | PRN
  Administered 2018-08-27 – 2018-09-05 (×4): 4 mg via INTRAVENOUS
  Filled 2018-08-26 (×4): qty 2

## 2018-08-26 MED ORDER — SODIUM CHLORIDE 0.9% IV SOLUTION
Freq: Once | INTRAVENOUS | Status: AC
  Administered 2018-08-26: 18:00:00 via INTRAVENOUS

## 2018-08-26 MED ORDER — ONDANSETRON HCL 4 MG PO TABS: 4.0000 mg | ORAL_TABLET | Freq: Four times a day (QID) | ORAL | Status: DC | PRN

## 2018-08-26 MED ORDER — METOCLOPRAMIDE HCL 5 MG/ML IJ SOLN
10.0000 mg | Freq: Once | INTRAMUSCULAR | Status: AC
  Administered 2018-08-26: 10 mg via INTRAVENOUS
  Filled 2018-08-26: qty 2

## 2018-08-26 MED ORDER — INSULIN GLARGINE 100 UNIT/ML ~~LOC~~ SOLN
18.0000 [IU] | Freq: Every day | SUBCUTANEOUS | Status: DC
  Administered 2018-08-26: 18 [IU] via SUBCUTANEOUS
  Filled 2018-08-26 (×2): qty 0.18

## 2018-08-26 MED ORDER — INSULIN DEGLUDEC 100 UNIT/ML ~~LOC~~ SOPN: 18.0000 [IU] | PEN_INJECTOR | Freq: Every day | SUBCUTANEOUS | Status: DC

## 2018-08-26 NOTE — ED Triage Notes (Signed)
Patient's son would like updates whenever possible: [phone].

## 2018-08-26 NOTE — ED Notes (Signed)
ED TO INPATIENT HANDOFF REPORT  ED Nurse Name and Phone #: [phone]  S Name/Age/Gender Ruth Douglas 78 y.o. female Room/Bed: 023C/023C  Code Status   Code Status: Prior  Home/SNF/Other Home Patient oriented to: self, place, time and situation Is this baseline? Yes   Triage Complete: Triage complete  Chief Complaint Nausea; vomiting; urinary problems  Triage Note Patient in c/o N/V, urinary frequency and pain with urination for 2-3 days. Denies fevers/chills. She states she was recently at [facility] for something similar and had to receive blood transfusion.  Patient's son would like updates whenever possible: [phone].    Allergies Allergies  Allergen Reactions  . Phenergan [Promethazine Hcl] Other (See Comments)    "causes me to be out of my head"     Level of Care/Admitting Diagnosis ED Disposition    ED Disposition Condition [place]: [place] [100100]  Level of Care: Telemetry Medical [104]  Covid Evaluation: Screening Protocol (No Symptoms)  Diagnosis: Pancytopenia ([place]) [621308]  Admitting Physician: Louellen Molder [phone]  Attending Physician: Louellen Molder [phone]  Estimated length of stay: past midnight tomorrow  Certification:: I certify this patient will need inpatient services for at least 2 midnights  PT Class (Do Not Modify): Inpatient [101]  PT Acc Code (Do Not Modify): Private [1]       B Medical/Surgery History Past Medical History:  Diagnosis Date  . Blood transfusion without reported diagnosis years ago  . CHF (congestive heart failure) ([place]) years ago   no current cardiologist, pt does not remember having chf  . Diabetes mellitus without complication ([place])   . History of kidney stones    x 2 in past  . Hypertension   . UTI (lower urinary tract infection)    Past Surgical History:  Procedure Laterality Date  . ABDOMINAL HYSTERECTOMY  08/2014   [place]  . ACHILLES TENDON SURGERY Left  06/01/2012   Procedure: PERCUTANEOUS TENDON ACHILLES [place];  Surgeon: Wylene Simmer, MD;  Location: [name];  Service: Orthopedics;  Laterality: Left;  . AMPUTATION Left 06/01/2012   Procedure: LEFT FOOT TRANSMETATARSEL AMPUTATION;  Surgeon: Wylene Simmer, MD;  Location: [place];  Service: Orthopedics;  Laterality: Left;  . AMPUTATION Right 06/20/2014   Procedure: AMPUTATION FOOT;  Surgeon: Newt Minion, MD;  Location: [place];  Service: Orthopedics;  Laterality: Right;  . CESAREAN SECTION    . CYSTOSCOPY W/ URETERAL STENT PLACEMENT Left 08/08/2014   Procedure: CYSTOSCOPY WITH RETROGRADE PYELOGRAM/URETERAL STENT PLACEMENT;  Surgeon: Festus Aloe, MD;  Location: [place];  Service: Urology;  Laterality: Left;  . CYSTOSCOPY WITH RETROGRADE PYELOGRAM, URETEROSCOPY AND STENT PLACEMENT Left 09/05/2014   Procedure: CYSTO, LEFT RETROGRADE PYELOGRAM, LEFT URETEROSCOPY AND STENT PLACEMENT;  Surgeon: Festus Aloe, MD;  Location: [facility];  Service: Urology;  Laterality: Left;  . CYSTOSCOPY WITH STENT PLACEMENT  01/16/2012   Procedure: CYSTOSCOPY WITH STENT PLACEMENT;  Surgeon: Dutch Gray, MD;  Location: [facility];  Service: Urology;  Laterality: Left;  cysto, retrograde , ureteroscopy , stone extraction with laser lithotripsy, stent placement on left, bladder biopsies with fulgaration  . EYE SURGERY Bilateral    lens replacements for cataracts  . HOLMIUM LASER APPLICATION  29/52/8413   Procedure: HOLMIUM LASER APPLICATION;  Surgeon: Dutch Gray, MD;  Location[facility];  Service: Urology;  Laterality: Left;  . HOLMIUM LASER APPLICATION Left 04/03/4008   Procedure: LEFT HOLMIUM LASER APPLICATION;  Surgeon: Festus Aloe, MD;  Location[facility];  Service: Urology;  Laterality: Left;  . TOE AMPUTATION       A IV Location/Drains/Wounds Patient Lines/Drains/Airways Status   Active Line/Drains/Airways    Name:   Placement date:   Placement time:   Site:   Days:   Peripheral IV 08/13/2018 Left Antecubital   08/15/2018    1446     Antecubital   less than 1   Incision (Closed) 08/08/14 Perineum Other (Comment)   08/08/14    2104     1479          Intake/Output Last 24 hours No intake or output data in the 24 hours ending 08/12/2018 1828  Labs/Imaging Results for orders placed or performed during the hospital encounter of 08/16/2018 (from the past 48 hour(s))  Comprehensive metabolic panel     Status: Abnormal   Collection Time: 08/25/2018  2:40 PM  Result Value Ref Range   Sodium 133 (L) 135 - 145 mmol/L   Potassium 3.7 3.5 - 5.1 mmol/L   Chloride 101 98 - 111 mmol/L   CO2 19 (L) 22 - 32 mmol/L   Glucose, Bld 176 (H) 70 - 99 mg/dL   BUN 28 (H) 8 - 23 mg/dL   Creatinine, Ser 2.67 (H) 0.44 - 1.00 mg/dL   Calcium 8.3 (L) 8.9 - 10.3 mg/dL   Total Protein 7.3 6.5 - 8.1 g/dL   Albumin 3.2 (L) 3.5 - 5.0 g/dL   AST 16 15 - 41 U/L   ALT 13 0 - 44 U/L   Alkaline Phosphatase 45 38 - 126 U/L   Total Bilirubin 0.6 0.3 - 1.2 mg/dL   GFR calc non Af Amer 17 (L) >60 mL/min   GFR calc Af Amer 19 (L) >60 mL/min   Anion gap 13 5 - 15    Comment: Performed at [facility], [address] [place], [place] [address]  Lipase, blood     Status: None   Collection Time: 08/15/2018  2:40 PM  Result Value Ref Range   Lipase 30 11 - 51 U/L    Comment: Performed at [facility], [place] [address], [place], [place] [address]  CBC with Differential/Platelet     Status: Abnormal   Collection Time: 08/08/2018  3:28 PM  Result Value Ref Range   WBC 0.7 (LL) 4.0 - 10.5 K/uL    Comment: REPEATED TO VERIFY WHITE COUNT CONFIRMED ON SMEAR THIS CRITICAL RESULT HAS VERIFIED AND BEEN CALLED TO SHAWNETT Yulissa Needham RN. BY TAMEECO CALDWELL ON 06 28 2020 AT [phone], AND HAS BEEN READ BACK.     RBC 2.15 (L) 3.87 - 5.11 MIL/uL   Hemoglobin 6.6 (LL) 12.0 - 15.0 g/dL    Comment: REPEATED TO VERIFY THIS CRITICAL RESULT HAS VERIFIED AND BEEN CALLED TO SHAWNETT Dodd Schmid RN. BY TAMEECO CALDWELL ON 06 28 2020 AT [phone], AND HAS BEEN READ BACK.     HCT 19.5 (L)  36.0 - 46.0 %   MCV 90.7 80.0 - 100.0 fL   MCH 30.7 26.0 - 34.0 pg   MCHC 33.8 30.0 - 36.0 g/dL   RDW 14.0 11.5 - 15.5 %   Platelets 28 (LL) 150 - 400 K/uL    Comment: REPEATED TO VERIFY PLATELET COUNT CONFIRMED BY SMEAR SPECIMEN CHECKED FOR CLOTS Immature Platelet Fraction may be clinically indicated, consider ordering this additional test [id number] CRITICAL RESULT CALLED TO, READ BACK BY AND VERIFIED WITH: SHAWNETT Jewelle Whitner RN.@1650  ON 6.28.2020 BY TCALDWELL MT.    nRBC 2.9 (H) 0.0 - 0.2 %  Neutrophils Relative % 42 %   Neutro Abs 0.3 (L) 1.7 - 7.7 K/uL   Lymphocytes Relative 49 %   Lymphs Abs 0.3 (L) 0.7 - 4.0 K/uL   Monocytes Relative 7 %   Monocytes Absolute 0.1 0.1 - 1.0 K/uL   Eosinophils Relative 0 %   Eosinophils Absolute 0.0 0.0 - 0.5 K/uL   Basophils Relative 1 %   Basophils Absolute 0.0 0.0 - 0.1 K/uL   Immature Granulocytes 1 %   Abs Immature Granulocytes 0.01 0.00 - 0.07 K/uL    Comment: Performed at [facility] [address] [place], [place] [address]  Troponin I (High Sensitivity)     Status: None   Collection Time: 08/13/2018  4:05 PM  Result Value Ref Range   Troponin I (High Sensitivity) 7 <18 ng/L    Comment: (NOTE) Elevated high sensitivity troponin I (hsTnI) values and significant  changes across serial measurements may suggest ACS but many other  chronic and acute conditions are known to elevate hsTnI results.  Refer to the "Links" section for chest pain algorithms and additional  guidance. Performed at [facility], [place] [address] [place], [place] [address]    No results found.  Pending Labs Unresulted Labs (From admission, onward)    Start     Ordered   08/15/2018 1813  Lactate dehydrogenase  Add-on,   AD     08/28/2018 1812   08/21/2018 1758  Culture, Urine  Once,   STAT    Question:  Patient immune status  Answer:  Immunocompromised   07/31/2018 1757   08/20/2018 1752  Prepare RBC  (Adult Blood Administration - Red Blood Cells)  Once,    R    Question Answer Comment  # of Units 2 units   Transfusion Indications Symptomatic Anemia   Number of Units to Keep Ahead 2 units ahead   If emergent release call blood bank Not emergent release      08/25/2018 1752   07/30/2018 1751  Culture, blood (routine x 2)  BLOOD CULTURE X 2,   R (with STAT occurrences)     08/15/2018 1750   08/13/2018 1751  Lactic acid, plasma  STAT Now then every 3 hours,   R (with STAT occurrences)     08/24/2018 1750   08/09/2018 1730  Type and screen [place]  Once,   STAT    Comments: [place]    08/23/2018 1729   08/02/2018 1724  SARS Coronavirus 2 (CEPHEID- Performed in [facility] hospital lab), Hosp Order  (Symptomatic Patients Labs with Precautions )  Once,   STAT     08/16/2018 1723   08/15/2018 1537  Troponin I (High Sensitivity)  STAT Now then every 2 hours,   R (with STAT occurrences)    Question:  Indication  Answer:  Other   08/04/2018 1537   08/06/2018 1528  Pathologist smear review  Once,   AD     08/14/2018 1528   08/18/2018 1419  CBC with Differential  (ED Abdominal Pain)  ONCE - STAT,   STAT     08/27/2018 1419   08/12/2018 1419  Urinalysis, Routine w reflex microscopic  (ED Abdominal Pain)  ONCE - STAT,   STAT     08/04/2018 1419   Signed and Held  Hemoglobin A1c  Once,   R    Comments: To assess prior glycemic control    Signed and Held   Signed and  Held  Basic metabolic panel  Tomorrow morning,   R     Signed and Held   Signed and Held  CBC with Differential/Platelet  Daily,   R     Signed and Held          Vitals/Pain Today's Vitals   08/18/2018 1500 07/31/2018 1600 08/12/2018 1700 07/31/2018 1800  BP: (!) 135/58 (!) 143/50 (!) 139/49   Pulse: 84 84 81   Resp: 16 17 16    SpO2: 95% 98% 94%   Weight:    75.5 kg  Height:    5\' 3"  (1.6 m)  PainSc:        Isolation Precautions Airborne and Contact precautions  Medications Medications  ceFEPIme (MAXIPIME) 1 g in sodium chloride 0.9 % 100 mL IVPB (has no  administration in time range)  metoCLOPramide (REGLAN) injection 10 mg (10 mg Intravenous Given 08/01/2018 1450)  0.9 %  sodium chloride infusion (Manually program via Guardrails IV Fluids) ( Intravenous New Bag/Given 08/21/2018 1820)    Mobility walks Low fall risk   Focused Assessments NA   R Recommendations: See Admitting Provider Note  Report given to:   Additional Notes:

## 2018-08-26 NOTE — ED Notes (Signed)
Attempted to call report

## 2018-08-26 NOTE — ED Triage Notes (Signed)
Patient in c/o N/V, urinary frequency and pain with urination for 2-3 days. Denies fevers/chills. She states she was recently at [facility] for something similar and had to receive blood transfusion.

## 2018-08-26 NOTE — Progress Notes (Signed)
Pharmacy Antibiotic Note  Ruth Douglas is a 78 y.o. female admitted on 08/17/2018 with UTI.  Pharmacy has been consulted for cefepime dosing. WBC is low at 0.7. SCr is elevated at 2.67. Pt recently completed a course of ciprofloxacin.   Plan: Cefepime 1gm IV Q24H F/u renal fxn, C&S, clinical status  Height: 5\' 3"  (160 cm) Weight: 166 lb 7.2 oz (75.5 kg) IBW/kg (Calculated) : 52.4  No data recorded.  Recent Labs  Lab 08/14/2018 1440 08/12/2018 1528  WBC  --  0.7*  CREATININE 2.67*  --     Estimated Creatinine Clearance: 17.7 mL/min (A) (by C-G formula based on SCr of 2.67 mg/dL (H)).    Allergies  Allergen Reactions  . Phenergan [Promethazine Hcl] Other (See Comments)    "causes me to be out of my head"     Antimicrobials this admission: Cefepime 6/28>>  Dose adjustments this admission: N/A  Microbiology results: Pending  Thank you for allowing pharmacy to be a part of this patient's care.  Desiree Fleming, Rande Lawman 08/02/2018 6:25 PM

## 2018-08-26 NOTE — ED Provider Notes (Addendum)
Pt's care assumed by me with labs pending/  Pt has a history of myelodysplastic disorder.  Pt was transfused on 6/8 at [facility].  Pt's hemoglobin was 7 on that day, wbc 1.4 and platelets were 48. Creat 1.75 and Bun 16. Pt reports burning with urination.  Pt not able to urinate today. She has been treated for urosepsis in the past.  No fever.  Today platelets are 28, hemoglobin 6.6 and wbc 0.7. BUN 28 and creat 2.67    Pt agrees to transfusion.  Iv fluid bolus ordered.  Consult for admission    Ruth Douglas 08/02/2018 1739    Ruth Meadow, PA-C 08/10/2018 1741    Lennice Sites, DO 08/10/2018 2059

## 2018-08-26 NOTE — H&P (Addendum)
TRH H&P   Patient Demographics:    Ruth Douglas, is a 78 y.o. female  MRN: [id number]   DOB - January 26, 1941  Admit Date - 08/10/2018  Outpatient Primary MD for the patient is Majel Homer, [place]  Referring MD: Dr. Angela Cox  Outpatient Specialists: None (seen by Dr. Joan Mayans nephrologist at [facility] recently)  Patient coming from: Home  Chief Complaint  Patient presents with  . Nausea  . Urinary Frequency      HPI:    [name]  is a 78 y.o. female, with a history of diabetes mellitus type 2 on insulin, chronic kidney disease stage III with baseline creatinine of 1.2-1.4 bilateral amputation of toes, left ureteral strictures with recurrent UTIs, lower extremity DVT (no longer on anticoagulation) and hypertension who was hospitalized 5 weeks back at [facility] with sepsis due to pyelonephritis and was found to be pancytopenic on admission.  She was evaluated by hematology.  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  As per the hematologist note FISH detected presence of both del (5 q.) and try (8).  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  Patient was recommended to initiate treatment with HMA but patient was hesitant and wanted to discuss with her son.  This is based on the hematologist Dr. Tamela Gammon evaluation in the office 6/8.  Patient wished to have her care in [place] where she lives. She presented to the ED today with complaints of dry heaving, right upper quadrant pain for past 2 days.  She also reports dysuria and abdominal bloating.  Dysuria dysuria has been off and on since she was hospitalized 5 weeks back for E. coli UTI which was mostly sensitive.  Patient denies any fevers, headache, blurred vision, dizziness, chills, vomiting, palpitations, chest pain,  shortness of breath, cough, diarrhea, muscle aches or joint pain.  She reports weakness and poor appetite.  Denies any weight loss.  Denies any sick contact or recent travel except for medical visit  Course in the ED Vitals were stable.  Blood work showed WBC of 0.7 with ANC of 294, hemoglobin of 6.6 g/dL, hematocrit of 19.5% and platelets of 38.  Chemistry showed sodium of 133, potassium of 3.7, CO2 of 19, glucose of 176, BUN of 28, creatinine of 2.67 and normal LFTs. Patient unable to give a urine sample.  SARS-CoV-2 ordered.  Hospitalist consulted for admission to telemetry given her pancytopenia and concern for recurrent UTI. Consented and ordered for 2 unit PRBC in the ED.  I ordered for blood cultures, UA, urine cultures, lactic acid and LDH.  Also order for chest x-ray and CT abdomen without contrast.      Review of systems:    In addition to the HPI above,  No Fever-chills, generalized weakness + No  Headache, No changes with Vision or hearing, No problems swallowing food or Liquids, No Chest pain, Cough or Shortness of Breath, Right upper quadrant abdominal pain, nausea, no vomiting, bowel movements are regular No Blood in stool or Urine, Dysuria + + No new skin rashes or bruises, No new joints pains-aches,  No new weakness, tingling, numbness in any extremity, No recent weight gain or loss, No polyuria, polydypsia or polyphagia, No significant Mental Stressors.   With Past History of the following :    Past Medical History:  Diagnosis Date  . Blood transfusion without reported diagnosis years ago  . CHF (congestive heart failure) ([place]) years ago   no current cardiologist, pt does not remember having chf  . Diabetes mellitus without complication ([place])   . History of kidney stones    x 2 in past  . Hypertension   . UTI (lower urinary tract infection)       Past Surgical History:  Procedure Laterality Date  . ABDOMINAL HYSTERECTOMY  08/2014   [place]  . ACHILLES  TENDON SURGERY Left 06/01/2012   Procedure: PERCUTANEOUS TENDON ACHILLES [place];  Surgeon: Wylene Simmer, MD;  Location: [place];  Service: Orthopedics;  Laterality: Left;  . AMPUTATION Left 06/01/2012   Procedure: LEFT FOOT TRANSMETATARSEL AMPUTATION;  Surgeon: Wylene Simmer, MD;  Location: [place];  Service: Orthopedics;  Laterality: Left;  . AMPUTATION Right 06/20/2014   Procedure: AMPUTATION FOOT;  Surgeon: Newt Minion, MD;  Location: [place];  Service: Orthopedics;  Laterality: Right;  . CESAREAN SECTION    . CYSTOSCOPY W/ URETERAL STENT PLACEMENT Left 08/08/2014   Procedure: CYSTOSCOPY WITH RETROGRADE PYELOGRAM/URETERAL STENT PLACEMENT;  Surgeon: Festus Aloe, MD;  Location: [place];  Service: Urology;  Laterality: Left;  . CYSTOSCOPY WITH RETROGRADE PYELOGRAM, URETEROSCOPY AND STENT PLACEMENT Left 09/05/2014   Procedure: CYSTO, LEFT RETROGRADE PYELOGRAM, LEFT URETEROSCOPY AND STENT PLACEMENT;  Surgeon: Festus Aloe, MD;  [facility];  Service: Urology;  Laterality: Left;  . CYSTOSCOPY WITH STENT PLACEMENT  01/16/2012   Procedure: CYSTOSCOPY WITH STENT PLACEMENT;  Surgeon: Dutch Gray, MD;  [facility];  Service: Urology;  Laterality: Left;  cysto, retrograde , ureteroscopy , stone extraction with laser lithotripsy, stent placement on left, bladder biopsies with fulgaration  . EYE SURGERY Bilateral    lens replacements for cataracts  . HOLMIUM LASER APPLICATION  30/16/0109   Procedure: HOLMIUM LASER APPLICATION;  Surgeon: Dutch Gray, MD;  [facility];  Service: Urology;  Laterality: Left;  . HOLMIUM LASER APPLICATION Left 04/30/3555   Procedure: LEFT HOLMIUM LASER APPLICATION;  Surgeon: Festus Aloe, MD;  [facility];  Service: Urology;  Laterality: Left;  . TOE AMPUTATION        Social History:     Social History   Tobacco Use  . Smoking status: Never Smoker  . Smokeless tobacco: Never Used  Substance Use Topics  . Alcohol use: No    Alcohol/week: 0.0 standard  drinks     Lives -home with her son  Mobility independent   Family History :     Family History  Problem Relation Age of Onset  . Diabetes Mellitus II Mother   . Diabetes Mellitus II Father   . Lung cancer Sister   . Kidney cancer Brother       Home Medications:   Prior to Admission medications   Medication Sig Start Date End Date Taking? Authorizing Provider  amLODipine (NORVASC) 10 MG tablet Take 10 mg by mouth every morning.  08/13/18  Yes [provider]  bisacodyl (DULCOLAX) 10 MG suppository Place 10 mg rectally daily as needed (constipation).   Yes [provider]  Cyanocobalamin (VITAMIN B-12 PO) Take 1 tablet by mouth every morning.   Yes [provider]  Ferrous Sulfate (IRON PO) Take 1 tablet by mouth every morning.   Yes [provider]  TRESIBA FLEXTOUCH 100 UNIT/ML SOPN FlexTouch Pen INJECT 18 UNITS INTO THE SKIN AT BEDTIME. Patient taking differently: Inject 22 Units into the skin at bedtime.  02/15/16  Yes Dettinger, Fransisca Kaufmann, MD  ciprofloxacin (CIPRO) 500 MG tablet Take 500 mg by mouth daily. 08/20/18   [provider]     Allergies:     Allergies  Allergen Reactions  . Phenergan [Promethazine Hcl] Other (See Comments)    "causes me to be out of my head"      Physical Exam:   Vitals  Blood pressure (!) 139/49, pulse 81, resp. rate 16, SpO2 94 %.   1. General elderly female lying in bed appears fatigued HEENT: Reactive bilaterally, EOMI, pallor present, dry mucosa, supple neck, no cervical lymphadenopathy Chest: Clear to auscultation bilaterally, no added sound CVs: Normal S1-S2, no murmurs rub or gallop GI: Soft, nondistended, bowel sounds present, right upper quadrant tenderness to palpation Musculoskeletal: Warm, no edema, no joint tenderness or rash CNS: Alert and oriented, nonfocal     Data Review:    CBC Recent Labs  Lab 08/04/2018 1528  WBC 0.7*  HGB 6.6*  HCT 19.5*  PLT 28*  MCV 90.7   MCH 30.7  MCHC 33.8  RDW 14.0  LYMPHSABS 0.3*  MONOABS 0.1  EOSABS 0.0  BASOSABS 0.0   ------------------------------------------------------------------------------------------------------------------  Chemistries  Recent Labs  Lab 08/13/2018 1440  NA 133*  K 3.7  CL 101  CO2 19*  GLUCOSE 176*  BUN 28*  CREATININE 2.67*  CALCIUM 8.3*  AST 16  ALT 13  ALKPHOS 45  BILITOT 0.6   ------------------------------------------------------------------------------------------------------------------ CrCl cannot be calculated (Unknown ideal weight.). ------------------------------------------------------------------------------------------------------------------ No results for input(s): TSH, T4TOTAL, T3FREE, THYROIDAB in the last 72 hours.  Invalid input(s): FREET3  Coagulation profile No results for input(s): INR, PROTIME in the last 168 hours. ------------------------------------------------------------------------------------------------------------------- No results for input(s): DDIMER in the last 72 hours. -------------------------------------------------------------------------------------------------------------------  Cardiac Enzymes No results for input(s): CKMB, TROPONINI, MYOGLOBIN in the last 168 hours.  Invalid input(s): CK ------------------------------------------------------------------------------------------------------------------ No results found for: BNP   ---------------------------------------------------------------------------------------------------------------  Urinalysis    Component Value Date/Time   COLORURINE YELLOW 08/14/2016 1846   APPEARANCEUR TURBID (A) 08/14/2016 1846   LABSPEC >1.030 (H) 08/14/2016 1846   PHURINE 6.0 08/14/2016 1846   GLUCOSEU NEGATIVE 08/14/2016 1846   HGBUR LARGE (A) 08/14/2016 1846   BILIRUBINUR NEGATIVE 08/14/2016 1846   KETONESUR NEGATIVE 08/14/2016 1846   PROTEINUR >300 (A) 08/14/2016 1846   UROBILINOGEN  0.2 09/23/2014 1104   NITRITE NEGATIVE 08/14/2016 1846   LEUKOCYTESUR MODERATE (A) 08/14/2016 1846    ----------------------------------------------------------------------------------------------------------------   Imaging Results:    No results found.  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  Has not started any treatment and has wished to seek care in [place] when offered by hematologist at [facility]. 2 unit PRBC ordered.  Monitor on telemetry.  Check LDH.  Maintain neutropenic precaution. Most clinical signs of sepsis.  Does not require platelet transfusion at this time. Consulted hematology  Dr. Lindi Adie who agrees with blood transfusion and recommends a dose of Granix for her neutropenia.  Recommends holding off on platelet transfusion unless she is actively bleeding or counts <10,000.  Recommends to send SPEP and IFE for her AKI.  He will see her tomorrow.   Active Problems: Acute on chronic kidney disease stage III (HCC) Baseline creatinine of 1.4-1.2.  Recently was elevated up to 2.  Creatinine worsened to 2.67 today.  Prerenal versus associated with myelodysplasia.  Check UA, urine culture and urine lites.  Check SPEP and IFE.  Avoid nephrotoxins.  As per the nephrologist note at [facility] recently patient reported taking some ibuprofen off and on for pain.  Placed on IV normal saline at 125 cc/h.  Check renal ultrasound.  Nausea with right upper quadrant pain LFTs normal.  Has right upper quadrant tenderness on exam.  Will obtain CT of the abdomen without contrast.  Dysuria with recurrent UTI symptoms Pending UA and urine culture.  Given her right upper quadrant pain and possible recurrent UTIs in pancytopenic state I will place her on empiric IV cefepime.  Blood and urine cultures ordered.     DM2 (diabetes mellitus, type  2), insulin-dependent (HCC) Continue Tresiba and sliding scale coverage.        DVT Prophylaxis SCDs  AM Labs Ordered, also please review Full Orders  Family Communication: Admission, patients condition and plan of care including tests being ordered have been discussed with the patient.   Code Status full code  Likely DC to home pending hospital course  Condition GUARDED   Consults called: Hematology ( Dr Lindi Adie)  Admission status: Inpatient Patient presenting with dysuria with UTI symptoms along with right upper quadrant pain and nausea in the setting of severe pancytopenia and ATN.  She needs to be monitored with IV fluids, empiric IV antibiotics, blood transfusion and close monitoring of her renal function and pancytopenia along with hematology evaluation.  She needs to be monitored as inpatient for at least >2 midnights.  Time spent in minutes : 70   Rojelio Uhrich M.D on 08/16/2018 at 6:21 PM  Between 7am to 7pm - Pager - [phone]. After 7pm go to [email or web address] - password [email or web address]  [facility] - Office  [phone]

## 2018-08-26 NOTE — ED Notes (Signed)
Took PT to restroom, PT unable to provide UA at this time.

## 2018-08-26 NOTE — ED Provider Notes (Signed)
[place] EMERGENCY DEPARTMENT Provider Note   CSN: [id number] Arrival date & time: 08/28/2018  1339     History   Chief Complaint Chief Complaint  Patient presents with  . Nausea  . Urinary Frequency    HPI Ruth Douglas is a 78 y.o. female who presents emergency department with chief complaint of nausea bloating and urinary frequency.  She has a past medical history of CHF, myelodysplastic disorder requiring blood transfusion, diabetes, hypertension, status post bilateral transmetatarsal amputations.  Patient was admitted at the beginning of May for sepsis due to pyelonephritis, pancytopenia and acute kidney injury superimposed on her chronic kidney disease.  Patient states that she always has a bit of urinary urgency but over the past several days has had increased burning with urination.  She states that over the past 2 days she developed bloating and pain in her upper abdominal quadrants and has over the past 24 hours had nausea and retching without vomiting.  She denies chest pain, shortness of breath, cough, fever or chills.    HPI  Past Medical History:  Diagnosis Date  . Blood transfusion without reported diagnosis years ago  . CHF (congestive heart failure) ([place]) years ago   no current cardiologist, pt does not remember having chf  . Diabetes mellitus without complication ([place])   . History of kidney stones    x 2 in past  . Hypertension   . UTI (lower urinary tract infection)     Patient Active Problem List   Diagnosis Date Noted  . Nephrolithiasis 08/08/2014  . Essential hypertension 06/19/2014  . Anemia 06/18/2014  . CKD (chronic kidney disease) 05/31/2012  . DVT (deep venous thrombosis) ([place]) 05/31/2012  . Hyperlipidemia 05/31/2012  . DM2 (diabetes mellitus, type 2) ([place]) 01/15/2012  . Diabetic foot ulcer ([place]) 01/15/2012    Past Surgical History:  Procedure Laterality Date  . ABDOMINAL HYSTERECTOMY  08/2014   [facility]  . ACHILLES  TENDON SURGERY Left 06/01/2012   Procedure: PERCUTANEOUS TENDON ACHILLES [place];  Surgeon: Wylene [place]mer, MD;  Location: [place];  Service: Orthopedics;  Laterality: Left;  . AMPUTATION Left 06/01/2012   Procedure: LEFT FOOT TRANSMETATARSEL AMPUTATION;  Surgeon: Wylene [place]mer, MD;  Location: [place];  Service: Orthopedics;  Laterality: Left;  . AMPUTATION Right 06/20/2014   Procedure: AMPUTATION FOOT;  Surgeon: Newt Minion, MD;  Location: [place];  Service: Orthopedics;  Laterality: Right;  . CESAREAN SECTION    . CYSTOSCOPY W/ URETERAL STENT PLACEMENT Left 08/08/2014   Procedure: CYSTOSCOPY WITH RETROGRADE PYELOGRAM/URETERAL STENT PLACEMENT;  Surgeon: Festus Aloe, MD;  Location: [place];  Service: Urology;  Laterality: Left;  . CYSTOSCOPY WITH RETROGRADE PYELOGRAM, URETEROSCOPY AND STENT PLACEMENT Left 09/05/2014   Procedure: CYSTO, LEFT RETROGRADE PYELOGRAM, LEFT URETEROSCOPY AND STENT PLACEMENT;  Surgeon: Festus Aloe, MD;  [facility];  Service: Urology;  Laterality: Left;  . CYSTOSCOPY WITH STENT PLACEMENT  01/16/2012   Procedure: CYSTOSCOPY WITH STENT PLACEMENT;  Surgeon: Dutch Gray, MD;  [facility];  Service: Urology;  Laterality: Left;  cysto, retrograde , ureteroscopy , stone extraction with laser lithotripsy, stent placement on left, bladder biopsies with fulgaration  . EYE SURGERY Bilateral    lens replacements for cataracts  . HOLMIUM LASER APPLICATION  53/29/9242   Procedure: HOLMIUM LASER APPLICATION;  Surgeon: Dutch Gray, MD;  [facility];  Service: Urology;  Laterality: Left;  . HOLMIUM LASER APPLICATION Left 08/05/3417   Procedure: LEFT HOLMIUM LASER APPLICATION;  Surgeon: Festus Aloe, MD;  [facility];  Service: Urology;  Laterality: Left;  . TOE AMPUTATION       OB History   No obstetric history on file.      Home Medications    Prior to Admission medications   Medication Sig Start Date End Date Taking? Authorizing Provider  acetaminophen (TYLENOL)  500 MG tablet Take 1,000 mg by mouth every 6 (six) hours as needed for mild pain (pain). Reported on 08/13/2015    [provider]  amLODipine (NORVASC) 5 MG tablet Take 5 mg by mouth every morning.  06/24/14   [provider]  cephALEXin (KEFLEX) 500 MG capsule Take 1 capsule (500 mg total) by mouth 4 (four) times daily. 08/14/16   Lajean Saver, MD  CRANBERRY PO Take 1 capsule by mouth every morning. Reported on 08/13/2015    [provider]  TRESIBA FLEXTOUCH 100 UNIT/ML SOPN FlexTouch Pen INJECT 18 UNITS INTO THE SKIN AT BEDTIME. 02/15/16   Dettinger, Fransisca Kaufmann, MD    Family History Family History  Problem Relation Age of Onset  . Diabetes Mellitus II Mother   . Diabetes Mellitus II Father   . Lung cancer Sister   . Kidney cancer Brother     Social History Social History   Tobacco Use  . Smoking status: Never Smoker  . Smokeless tobacco: Never Used  Substance Use Topics  . Alcohol use: No    Alcohol/week: 0.0 standard drinks  . Drug use: No     Allergies   Phenergan [promethazine hcl]   Review of Systems Review of Systems Ten systems reviewed and are negative for acute change, except as noted in the HPI.   Physical Exam Updated Vital Signs There were no vitals taken for this visit.  Physical Exam Vitals signs and nursing note reviewed.  Constitutional:      General: She is not in acute distress.    Appearance: She is well-developed. She is not diaphoretic.  HENT:     Head: Normocephalic and atraumatic.  Eyes:     General: No scleral icterus.    Conjunctiva/sclera: Conjunctivae normal.  Neck:     Musculoskeletal: Normal range of motion.  Cardiovascular:     Rate and Rhythm: Normal rate and regular rhythm.     Heart sounds: Normal heart sounds. No murmur. No friction rub. No gallop.   Pulmonary:     Effort: Pulmonary effort is normal. No respiratory distress.     Breath sounds: Normal breath sounds.  Abdominal:     General: Bowel  sounds are normal. There is no distension.     Palpations: Abdomen is soft. There is no mass.     Tenderness: There is generalized abdominal tenderness and tenderness in the suprapubic area. There is no right CVA tenderness, left CVA tenderness or guarding.  Skin:    General: Skin is warm and dry.  Neurological:     Mental Status: She is alert and oriented to person, place, and time.  Psychiatric:        Behavior: Behavior normal.      ED Treatments / Results  Labs (all labs ordered are listed, but only abnormal results are displayed) Labs Reviewed  COMPREHENSIVE METABOLIC PANEL - Abnormal; Notable for the following components:      Result Value   Sodium 133 (*)    CO2 19 (*)    Glucose, Bld 176 (*)    BUN 28 (*)    Creatinine, Ser 2.67 (*)    Calcium 8.3 (*)  Albumin 3.2 (*)    GFR calc non Af Amer 17 (*)    GFR calc Af Amer 19 (*)    All other components within normal limits  LIPASE, BLOOD  CBC WITH DIFFERENTIAL/PLATELET  URINALYSIS, ROUTINE W REFLEX MICROSCOPIC    EKG    Radiology No results found.  Procedures Procedures (including critical care time)  Medications Ordered in ED Medications  metoCLOPramide (REGLAN) injection 10 mg (10 mg Intravenous Given 08/27/2018 1450)     Initial Impression / Assessment and Plan / ED Course  I have reviewed the triage vital signs and the nursing notes.  Pertinent labs & imaging results that were available during my care of the patient were reviewed by me and considered in my medical decision making (see chart for details).       Is a 78 year old female with a history of recurrent urosepsis and myelodysplastic disorder who presents today with complaints of retching, dysuria and urinary frequency.  She is currently afebrile and hemodynamically stable.  Her labs are currently pending and I am unable to review them at this point.[name] Has pending labs for urinalysis, basic labs including CBC and CMP.  I have given  signout to [place] who will assume care of the patient for appropriate disposition and care.  Final Clinical Impressions(s) / ED Diagnoses   Final diagnoses:  None    ED Discharge Orders    None       Margarita Mail, PA-C 08/28/18 1343    Isla Pence, MD 08/29/18 1434

## 2018-08-27 ENCOUNTER — Encounter (HOSPITAL_COMMUNITY): Payer: Self-pay | Admitting: Surgery

## 2018-08-27 DIAGNOSIS — J189 Pneumonia, unspecified organism: Secondary | ICD-10-CM

## 2018-08-27 DIAGNOSIS — D61818 Other pancytopenia: Principal | ICD-10-CM

## 2018-08-27 DIAGNOSIS — D469 Myelodysplastic syndrome, unspecified: Secondary | ICD-10-CM

## 2018-08-27 DIAGNOSIS — N3090 Cystitis, unspecified without hematuria: Secondary | ICD-10-CM

## 2018-08-27 LAB — BASIC METABOLIC PANEL
Anion gap: 12 (ref 5–15)
BUN: 25 mg/dL — ABNORMAL HIGH (ref 8–23)
CO2: 19 mmol/L — ABNORMAL LOW (ref 22–32)
Calcium: 7.8 mg/dL — ABNORMAL LOW (ref 8.9–10.3)
Chloride: 104 mmol/L (ref 98–111)
Creatinine, Ser: 2.37 mg/dL — ABNORMAL HIGH (ref 0.44–1.00)
GFR calc Af Amer: 22 mL/min — ABNORMAL LOW (ref >60)
GFR calc non Af Amer: 19 mL/min — ABNORMAL LOW (ref >60)
Glucose, Bld: 264 mg/dL — ABNORMAL HIGH (ref 70–99)
Potassium: 3.8 mmol/L (ref 3.5–5.1)
Sodium: 135 mmol/L (ref 135–145)

## 2018-08-27 LAB — URINALYSIS, ROUTINE W REFLEX MICROSCOPIC
Bilirubin Urine: NEGATIVE
Glucose, UA: NEGATIVE mg/dL
Ketones, ur: NEGATIVE mg/dL
Leukocytes,Ua: NEGATIVE
Nitrite: NEGATIVE
Protein, ur: 100 mg/dL — AB
Specific Gravity, Urine: 1.009 (ref 1.005–1.030)
pH: 5 (ref 5.0–8.0)

## 2018-08-27 LAB — CBC WITH DIFFERENTIAL/PLATELET
Abs Immature Granulocytes: 0.03 10*3/uL (ref 0.00–0.07)
Basophils Absolute: 0 10*3/uL (ref 0.0–0.1)
Basophils Relative: 1 %
Eosinophils Absolute: 0 10*3/uL (ref 0.0–0.5)
Eosinophils Relative: 0 %
HCT: 23.1 % — ABNORMAL LOW (ref 36.0–46.0)
Hemoglobin: 8 g/dL — ABNORMAL LOW (ref 12.0–15.0)
Immature Granulocytes: 4 %
Lymphocytes Relative: 61 %
Lymphs Abs: 0.5 10*3/uL — ABNORMAL LOW (ref 0.7–4.0)
MCH: 30.5 pg (ref 26.0–34.0)
MCHC: 34.6 g/dL (ref 30.0–36.0)
MCV: 88.2 fL (ref 80.0–100.0)
Monocytes Absolute: 0.1 10*3/uL (ref 0.1–1.0)
Monocytes Relative: 7 %
Neutro Abs: 0.2 10*3/uL — ABNORMAL LOW (ref 1.7–7.7)
Neutrophils Relative %: 27 %
Platelets: 25 10*3/uL — CL (ref 150–400)
RBC: 2.62 MIL/uL — ABNORMAL LOW (ref 3.87–5.11)
RDW: 14.4 % (ref 11.5–15.5)
WBC: 0.8 10*3/uL — CL (ref 4.0–10.5)
nRBC: 3.7 % — ABNORMAL HIGH (ref 0.0–0.2)

## 2018-08-27 LAB — HEMOGLOBIN AND HEMATOCRIT, BLOOD
HCT: 31.2 % — ABNORMAL LOW (ref 36.0–46.0)
Hemoglobin: 10.9 g/dL — ABNORMAL LOW (ref 12.0–15.0)

## 2018-08-27 LAB — PATHOLOGIST SMEAR REVIEW

## 2018-08-27 LAB — HEMOGLOBIN A1C
Hgb A1c MFr Bld: 6.9 % — ABNORMAL HIGH (ref 4.8–5.6)
Mean Plasma Glucose: 151.33 mg/dL

## 2018-08-27 LAB — GLUCOSE, CAPILLARY
Glucose-Capillary: 186 mg/dL — ABNORMAL HIGH (ref 70–99)
Glucose-Capillary: 157 mg/dL — ABNORMAL HIGH (ref 70–99)

## 2018-08-27 LAB — LACTIC ACID, PLASMA: Lactic Acid, Venous: 1.1 mmol/L (ref 0.5–1.9)

## 2018-08-27 MED ORDER — INSULIN ASPART 100 UNIT/ML ~~LOC~~ SOLN
0.0000 [IU] | Freq: Every day | SUBCUTANEOUS | Status: DC
  Administered 2018-08-31 – 2018-09-02 (×3): 2 [IU] via SUBCUTANEOUS

## 2018-08-27 MED ORDER — TBO-FILGRASTIM 480 MCG/0.8ML ~~LOC~~ SOSY
480.0000 ug | PREFILLED_SYRINGE | Freq: Once | SUBCUTANEOUS | Status: AC
  Administered 2018-08-27: 480 ug via SUBCUTANEOUS
  Filled 2018-08-27: qty 0.8

## 2018-08-27 MED ORDER — INSULIN ASPART 100 UNIT/ML ~~LOC~~ SOLN
3.0000 [IU] | Freq: Three times a day (TID) | SUBCUTANEOUS | Status: DC
  Administered 2018-08-27 – 2018-08-29 (×5): 3 [IU] via SUBCUTANEOUS

## 2018-08-27 MED ORDER — INSULIN ASPART 100 UNIT/ML ~~LOC~~ SOLN
0.0000 [IU] | Freq: Three times a day (TID) | SUBCUTANEOUS | Status: DC
  Administered 2018-08-27 – 2018-08-28 (×2): 3 [IU] via SUBCUTANEOUS
  Administered 2018-08-28 – 2018-08-29 (×3): 2 [IU] via SUBCUTANEOUS
  Administered 2018-08-29 – 2018-08-30 (×4): 3 [IU] via SUBCUTANEOUS
  Administered 2018-08-31 – 2018-09-01 (×2): 2 [IU] via SUBCUTANEOUS
  Administered 2018-09-01: 5 [IU] via SUBCUTANEOUS
  Administered 2018-09-02: 2 [IU] via SUBCUTANEOUS
  Administered 2018-09-03: 5 [IU] via SUBCUTANEOUS
  Administered 2018-09-03 – 2018-09-04 (×3): 3 [IU] via SUBCUTANEOUS
  Administered 2018-09-04: 8 [IU] via SUBCUTANEOUS
  Administered 2018-09-05: 11 [IU] via SUBCUTANEOUS

## 2018-08-27 MED ORDER — INSULIN GLARGINE 100 UNIT/ML ~~LOC~~ SOLN
22.0000 [IU] | Freq: Every day | SUBCUTANEOUS | Status: DC
  Administered 2018-08-27 – 2018-09-04 (×9): 22 [IU] via SUBCUTANEOUS
  Filled 2018-08-27 (×10): qty 0.22

## 2018-08-27 NOTE — Consult Note (Addendum)
[place]  [phone] [phone]    INITIAL HEMATOLOGY CONSULTATION  Referring MD:  Dr. Flonnie Overman Dhungel  Reason for Referral: Pancytopenia, myelodysplastic syndrome  HPI: Ms. 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  She was hospitalized in May 2020 at [facility] with sepsis secondary to pyelonephritis and was found to have pancytopenia on admission.  Outside records have been reviewed through care everywhere.  Hematology performed a bone marrow biopsy on her on 07/13/2018 which showed hypercellular marrow (70%) with trilineage hematopoiesis, megakaryocytic dyspoiesis and increased blasts (approximately 5 to 7%). Findings are concerning for involvement by a myeloid neoplasm (e.g. myelodysplastic syndrome with excess blasts 1). However, reactive/benign causes of cytopenias must be excluded clinically (e.g. vitamin/mineral deficiencies, toxins, autoimmune processes, etc). FISH detected presence of both del(5q) and tri(8). 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  Hematology recommended for her to begin treatment for MDS, but the patient wanted to discuss this further with her son before deciding.  The patient states that she notified her hematologist that she wished to follow-up closer to home in [place], [place].  Today, she states that this has not yet been arranged for her.  The patient presented to the [facility] emergency room with complaints of dry heaving and right upper quadrant pain for approximately 2 days.  She also reported urinary  frequency and dysuria.  Work-up in the emergency room again showed pancytopenia including a white blood cell count of 0.7, ANC 0.3, hemoglobin 6.6, and platelet count of 38,000.  BUN was 28 and creatinine 2.67.  LDH was elevated on admission at 398.  Lactic acid was normal at 1.1. Blood cultures have been obtained and are negative to date.  Urine culture is pending.  Chest x-ray was obtained which shows airspace opacity involving the right middle lobe and right lower lobe concerning for developing pneumonia.  A CT of the abdomen and pelvis without contrast showed mild left hydronephrosis without obstructing stone or mass identified, mild diffuse bladder wall thickening compatible with cystitis, small right pleural effusion partially visualized consolidations greater on the left lung, suspected pneumonia, and bilateral nonobstructive nephrolithiasis.  She was admitted due to her pancytopenia and recurrent urinary tract infection.  When seen today, the patient reports fatigue.  She denies anorexia, weight loss, night sweats.  Denies headaches and visual changes.  Denies dizziness.  Denies chest discomfort or shortness of breath.  Reports a mild nonproductive cough.  Abdominal pain has resolved.  Denies nausea, vomiting, constipation, diarrhea.  Denies bleeding.  Hematology was asked see the patient to make recommendations regarding her pancytopenia.  Past Medical History:  Diagnosis Date  . Blood transfusion without reported diagnosis years ago  . CHF (congestive heart failure) ([place]) years ago   no current cardiologist, pt does not remember having chf  . Diabetes mellitus without complication ([place])   . History of kidney stones    x 2 in past  . Hypertension   . UTI (lower urinary tract infection)   :    Past Surgical History:  Procedure Laterality Date  . ABDOMINAL HYSTERECTOMY  08/2014   [facility]  . ACHILLES TENDON  SURGERY Left 06/01/2012   Procedure: PERCUTANEOUS TENDON ACHILLES [place];   Surgeon: Wylene Simmer, MD;  Location: [facility];  Service: Orthopedics;  Laterality: Left;  . AMPUTATION Left 06/01/2012   Procedure: LEFT FOOT TRANSMETATARSEL AMPUTATION;  Surgeon: Wylene Simmer, MD;  Location: [facility];  Service: Orthopedics;  Laterality: Left;  . AMPUTATION Right 06/20/2014   Procedure: AMPUTATION FOOT;  Surgeon: Newt Minion, MD;  Location: [facility];  Service: Orthopedics;  Laterality: Right;  . CESAREAN SECTION    . CYSTOSCOPY W/ URETERAL STENT PLACEMENT Left 08/08/2014   Procedure: CYSTOSCOPY WITH RETROGRADE PYELOGRAM/URETERAL STENT PLACEMENT;  Surgeon: Festus Aloe, MD;  Location: [facility];  Service: Urology;  Laterality: Left;  . CYSTOSCOPY WITH RETROGRADE PYELOGRAM, URETEROSCOPY AND STENT PLACEMENT Left 09/05/2014   Procedure: CYSTO, LEFT RETROGRADE PYELOGRAM, LEFT URETEROSCOPY AND STENT PLACEMENT;  Surgeon: Festus Aloe, MD;  Location: [facility];  Service: Urology;  Laterality: Left;  . CYSTOSCOPY WITH STENT PLACEMENT  01/16/2012   Procedure: CYSTOSCOPY WITH STENT PLACEMENT;  Surgeon: Dutch Gray, MD;  Location: [facility];  Service: Urology;  Laterality: Left;  cysto, retrograde , ureteroscopy , stone extraction with laser lithotripsy, stent placement on left, bladder biopsies with fulgaration  . EYE SURGERY Bilateral    lens replacements for cataracts  . HOLMIUM LASER APPLICATION  79/04/4095   Procedure: HOLMIUM LASER APPLICATION;  Surgeon: Dutch Gray, MD;  Location[facility];  Service: Urology;  Laterality: Left;  . HOLMIUM LASER APPLICATION Left 05/02/3297   Procedure: LEFT HOLMIUM LASER APPLICATION;  Surgeon: Festus Aloe, MD;  Location[facility];  Service: Urology;  Laterality: Left;  . TOE AMPUTATION    :   CURRENT MEDS: Current Facility-Administered Medications  Medication Dose Route Frequency Provider Last Rate Last Dose  . 0.9 %  sodium chloride infusion   Intravenous Continuous Dhungel, Nishant, MD 125 mL/hr at 08/25/2018 2031    . acetaminophen (TYLENOL) tablet 650 mg  650 mg  Oral Q6H PRN Dhungel, Nishant, MD       Or  . acetaminophen (TYLENOL) suppository 650 mg  650 mg Rectal Q6H PRN Dhungel, Nishant, MD      . amLODipine (NORVASC) tablet 10 mg  10 mg Oral q morning - 10a Dhungel, Nishant, MD   10 mg at 08/27/18 1158  . bisacodyl (DULCOLAX) suppository 10 mg  10 mg Rectal Daily PRN Dhungel, Nishant, MD      . ceFEPIme (MAXIPIME) 1 g in sodium chloride 0.9 % 100 mL IVPB  1 g Intravenous Q24H Rumbarger, Rachel L, RPH 200 mL/hr at 08/03/2018 2031 1 g at 08/25/2018 2031  . insulin aspart (novoLOG) injection 3 Units  3 Units Subcutaneous TID WC Dhungel, Nishant, MD      . insulin glargine (LANTUS) injection 22 Units  22 Units Subcutaneous QHS Dhungel, Nishant, MD      . ondansetron (ZOFRAN) tablet 4 mg  4 mg Oral Q6H PRN Dhungel, Nishant, MD       Or  . ondansetron (ZOFRAN) injection 4 mg  4 mg Intravenous Q6H PRN Dhungel, Nishant, MD      . Tbo-Filgrastim (GRANIX) injection 480 mcg  480 mcg Subcutaneous Once Dhungel, Nishant, MD      . vitamin B-12 (CYANOCOBALAMIN) tablet 100 mcg  100 mcg Oral q morning - 10a Dhungel, Nishant, MD   100 mcg at 08/27/18 1156      Allergies  Allergen Reactions  . Phenergan [Promethazine Hcl] Other (See Comments)    "causes me to be out of my  head"   :  Family History  Problem Relation Age of Onset  . Diabetes Mellitus II Mother   . Diabetes Mellitus II Father   . Lung cancer Sister   . Kidney cancer Brother   :  Social History   Socioeconomic History  . Marital status: Widowed    Spouse name: Not on file  . Number of children: Not on file  . Years of education: Not on file  . Highest education level: Not on file  Occupational History  . Not on file  Social Needs  . Financial resource strain: Not on file  . Food insecurity    Worry: Not on file    Inability: Not on file  . Transportation needs    Medical: Not on file    Non-medical: Not on file  Tobacco Use  . Smoking status: Never Smoker  . Smokeless tobacco:  Never Used  Substance and Sexual Activity  . Alcohol use: No    Alcohol/week: 0.0 standard drinks  . Drug use: No  . Sexual activity: Not on file    Comment: husband passed 2016  Lifestyle  . Physical activity    Days per week: Not on file    Minutes per session: Not on file  . Stress: Not on file  Relationships  . Social Herbalist on phone: Not on file    Gets together: Not on file    Attends religious service: Not on file    Active member of club or organization: Not on file    Attends meetings of clubs or organizations: Not on file    Relationship status: Not on file  . Intimate partner violence    Fear of current or ex partner: Not on file    Emotionally abused: Not on file    Physically abused: Not on file    Forced sexual activity: Not on file  Other Topics Concern  . Not on file  Social History Narrative  . Not on file  :  REVIEW OF SYSTEMS: A comprehensive 14 point review of systems was negative except as noted in the HPI.  Exam: Patient Vitals for the past 24 hrs:  BP Temp Temp src Pulse Resp SpO2 Height Weight  08/27/18 1154 (!) 146/60 98.2 F (36.8 C) Oral 80 18 95 % - -  08/27/18 1129 (!) 141/65 99.8 F (37.7 C) Oral 78 18 95 % - -  08/27/18 0700 - - - - - - - 162 lb 6.4 oz (73.7 kg)  08/27/18 0511 134/60 99 F (37.2 C) Oral 74 - 99 % - -  08/27/18 0205 (!) 137/57 99.1 F (37.3 C) Oral 73 - 98 % - -  08/27/18 0129 (!) 115/45 99.7 F (37.6 C) Oral 75 16 97 % - -  08/01/2018 1902 139/68 99.8 F (37.7 C) Oral 90 18 100 % '5\' 3"'  (1.6 m) 158 lb 14.4 oz (72.1 kg)  08/10/2018 1800 - - - - - - '5\' 3"'  (1.6 m) 166 lb 7.2 oz (75.5 kg)  08/16/2018 1700 (!) 139/49 - - 81 16 94 % - -  08/25/2018 1600 (!) 143/50 - - 84 17 98 % - -  08/10/2018 1500 (!) 135/58 - - 84 16 95 % - -    General:  well-nourished in no acute distress.   Eyes:  no scleral icterus.   ENT:  There were no oropharyngeal lesions.   Neck was without thyromegaly.  Lymphatics:  Negative cervical,  supraclavicular or axillary adenopathy.   Respiratory: Fine bibasilar rales. Cardiovascular:  Regular rate and rhythm, S1/S2, without murmur, rub or gallop.  There was no pedal edema.   GI:  abdomen was soft, flat, nontender, nondistended, without organomegaly.  Musculoskeletal:  no spinal tenderness of palpation of vertebral spine.  Toes have been amputated on her bilateral feet. Skin exam was without ecchymosis, petechiae.   Neuro exam was nonfocal.  Patient was alert and oriented.  Attention was good.   Language was appropriate.  Mood was normal without depression.  Speech was not pressured.  Thought content was not tangential.    LABS:  Lab Results  Component Value Date   WBC 0.8 (LL) 08/27/2018   HGB 8.0 (L) 08/27/2018   HCT 23.1 (L) 08/27/2018   PLT 25 (LL) 08/27/2018   GLUCOSE 264 (H) 08/27/2018   CHOL 328 (H) 08/13/2015   TRIG 430 (H) 08/13/2015   HDL 52 08/13/2015   LDLCALC Comment 08/13/2015   ALT 13 08/10/2018   AST 16 08/25/2018   NA 135 08/27/2018   K 3.8 08/27/2018   CL 104 08/27/2018   CREATININE 2.37 (H) 08/27/2018   BUN 25 (H) 08/27/2018   CO2 19 (L) 08/27/2018   INR 1.22 01/21/2012   HGBA1C 11.8 (H) 08/13/2015    Ct Abdomen Pelvis Wo Contrast  Result Date: 08/11/2018 CLINICAL DATA:  78 y/o  F; abdominal pain. EXAM: CT ABDOMEN AND PELVIS WITHOUT CONTRAST TECHNIQUE: Multidetector CT imaging of the abdomen and pelvis was performed following the standard protocol without IV contrast. COMPARISON:  07/08/2018 and 06/10/2016 CT of abdomen and pelvis. FINDINGS: Lower chest: Small right pleural effusion. Partially visualized consolidations within the right middle lobe, right lower lobe, and left medial lower lobe. New oblong 10 mm nodule in left lung base, probably mucoid impaction of an airway. Hepatobiliary: No focal liver abnormality is seen. No gallstones, gallbladder wall thickening, or biliary dilatation. Pancreas: Unremarkable. No pancreatic ductal dilatation or  surrounding inflammatory changes. Spleen: Normal in size without focal abnormality. Adrenals/Urinary Tract: Normal adrenal glands. Multiple hypodense foci within the kidneys bilaterally with cyst on prior studies, suboptimal assessment without contrast. Bilateral punctate nonobstructive nephrolithiasis. No right hydronephrosis. Mild left hydronephrosis without obstructing stone or mass. Mild diffuse bladder wall thickening. Stomach/Bowel: Stomach is within normal limits. Appendix appears normal. No evidence of bowel wall thickening, distention, or inflammatory changes. Contrast extends to the descending colon. Vascular/Lymphatic: Aortic atherosclerosis. No enlarged abdominal or pelvic lymph nodes. Reproductive: Status post hysterectomy. No adnexal masses. Other: Small paraumbilical hernia containing fat. No abdominopelvic ascites. Musculoskeletal: No fracture is seen. IMPRESSION: 1. Mild left hydronephrosis without obstructing stone or mass identified. Findings may represent infection of the collecting system or possibly a recently passed stone. 2. Mild diffuse bladder wall thickening compatible with cystitis. 3. Small right pleural effusion partially visualized consolidations greater in the left lung, suspected pneumonia. 4. Bilateral nonobstructive nephrolithiasis. Electronically Signed   By: Kristine Garbe M.D.   On: 08/25/2018 23:34   Dg Chest Port 1 View  Result Date: 08/17/2018 CLINICAL DATA:  Nausea and vomiting. EXAM: PORTABLE CHEST 1 VIEW COMPARISON:  March since 2018 FINDINGS: There is no apparent airspace opacity involving the right middle lobe and right lower lobe. The heart size is borderline enlarged. There is no pneumothorax. No large pleural effusion. There is some mild elevation of the right hemidiaphragm. There is no acute osseous abnormality. IMPRESSION: Airspace opacity involving the right middle lobe and right lower lobe concerning for developing pneumonia.  Follow-up to radiologic  resolution is recommended. Electronically Signed   By: Constance Holster M.D.   On: 08/15/2018 18:57    ASSESSMENT AND PLAN:   1.  Myelodysplastic syndrome, high risk 2.  Pancytopenia secondary to #1 3.  Pneumonia 4.  Recurrent cystitis 5.  Acute kidney injury superimposed on chronic kidney disease, stage III 6.  Diabetes mellitus 7.  Hypertension  -The patient has high risk MDS and will need treatment as an outpatient for this.  She stated that she was awaiting an appointment in [place], [place] but today states that she would like to actually be treated in [place], [place] at [facility] as this is closer her house.  We can arrange for outpatient follow-up at the [facility] once we know her plan for discharge. -Recommend supportive care for her pancytopenia.  The patient is due for dose of Granix today.  She has received 2 units of packed red blood cells for her anemia.  Recommend a daily CBC and transfuse packed red blood cells for hemoglobin less than 7.0 or active bleeding.  Transfuse platelets for platelet count less than 10,000 or active bleeding. -Await results of SPEP and IFE -Treatment for her pneumonia recurrent cystitis per hospitalist. -IV fluids per hospitalist.  Avoid nephrotoxic medications.  Thank you for this referral.  Mikey Bussing, DNP, AGPCNP-BC, AOCNP  Attending Note  I personally saw the patient, reviewed the chart and examined the patient. The plan of care was discussed with the patient and the admitting team. I agree with the assessment and plan as documented above. Thank you very much for the consultation. 1.  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 Agree with current treatment plan of supportive care with blood transfusion as needed. Patient will benefit from outpatient hypo-methylating agent therapy for MDS. She would like to be seen in [place] at the cancer center. 2.  Severe  thrombocytopenia: Relatively stable at 25. 3.  Neutropenia: ANC 0.2, Granix being given because there is concern for infection/pneumonia. 4.  Acute kidney injury superimposed on CKD stage III: Could be related diabetes and hypertension.  SPEP ordered pending. We will follow along Thank very much for consulting [place].

## 2018-08-27 NOTE — Progress Notes (Signed)
PROGRESS NOTE                                                                                                                                                                                                             Patient Demographics:    Ruth Douglas, is a 78 y.o. female, DOB - 30-May-1940, KPT:[id number]  Admit date - 08/08/2018   Admitting Physician Louellen Molder, MD  Outpatient Primary MD for the patient is Majel Homer, [place]  LOS - 1  Outpatient Specialists: None  Chief Complaint  Patient presents with  . Nausea  . Urinary Frequency       Brief Narrative  Please refer to admission H&P for details, in brief, 78 year old female with diabetes mellitus on insulin, chronic kidney disease stage III (baseline creatinine 1.2-1.4), left ureteral strictures with recurrent UTIs, hypertension who was hospitalized 5 weeks back at [facility] with E. coli UTI and found to be pancytopenic.  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  She was offered treatment but wished to follow-up at [facility] where she lives.  She was seen by the hematologist 3 weeks back when she was still pancytopenic. Patient presented to the ED with abdominal fullness, dysuria and nausea along with right upper quadrant pain.  Blood work showed severe pancytopenia with ANC <300, hemoglobin of 6.6 and platelets of 38.  Also found to have acute kidney injury with creatinine of 2.67. Ordered for PRBC transfusion and admission to telemetry.  Chest x-ray and CT of the abdomen showing bilateral lower lobe pneumonia.  Also shows cystitis and?  Pyelonephritis versus recently passed stone.  COVID-19 was tested negative.    Subjective:   Denies further nausea but still has some abdominal fullness and dysuria.  Afebrile.  Complains of pain in her right leg.   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  2 unit PRBC ordered (received 1 unit only so far).  Hemoglobin this morning 8.  Still has severe neutropenia (0.8) and thrombocytopenia (25). Does not need platelet transfusion.  Was ordered Granix 480 mcg on admission but got time for 6 PM today only.  Ordered dose to be given now. Continue neutropenic precaution.  Check SPEP and IFE.  LDH elevated (398) Hematology (  Dr Lindi Adie) unsalted and will see. I asked her if she was interested to see hematologist in [place] when she gets discharged and she agrees for it.  Healthcare associated pneumonia Recurrent cystitis Blood culture sent on admission.  UA and urine culture ordered.  On empiric cefepime.  Active Problems: Acute kidney injury superimposed on chronic kidney disease stage III Follow urine sodium and osmolality.  Could be prerenal but highly suspicious of ATN secondary to MDS.  Minimal improvement in renal function this morning.  Continue IV fluids and avoid nephrotoxins.     DM2 (diabetes mellitus, type 2), uncontrolled with hyperglycemia (HCC) Increased Lantus to 22 units and add pre-meal aspart.    Essential hypertension Stable.  Resume home meds      Code Status : Full code  Family Communication  : We will update son  Disposition Plan  : Pending clinical improvement  Barriers For Discharge : Active symptoms  Consults  : Dr. Lindi Adie  Procedures  : CT abdomen and pelvis without contrast  DVT Prophylaxis  : Days  Lab Results  Component Value Date   PLT 25 (LL) 08/27/2018    Antibiotics  :    Anti-infectives (From admission, onward)   Start     Dose/Rate Route Frequency Ordered Stop   08/06/2018 1900  ceFEPIme (MAXIPIME) 1 g in sodium chloride 0.9 % 100 mL IVPB     1 g 200 mL/hr over 30 Minutes Intravenous Every 24 hours 08/06/2018 1825          Objective:   Vitals:   08/27/18 0205 08/27/18 0511 08/27/18 0700 08/27/18  1129  BP: (!) 137/57 134/60  (!) 141/65  Pulse: 73 74  78  Resp:    18  Temp: 99.1 F (37.3 C) 99 F (37.2 C)  99.8 F (37.7 C)  TempSrc: Oral Oral  Oral  SpO2: 98% 99%  95%  Weight:   73.7 kg   Height:        Wt Readings from Last 3 Encounters:  08/27/18 73.7 kg  08/13/15 68.6 kg  10/02/14 61.2 kg     Intake/Output Summary (Last 24 hours) at 08/27/2018 1140 Last data filed at 08/27/2018 1024 Gross per 24 hour  Intake 2331.67 ml  Output 200 ml  Net 2131.67 ml     Physical Exam  Gen: not in distress, fatigue HEENT: Pallor present, no icterus, moist mucosa, supple neck Chest: Fine bibasilar crackles CVS: Normal S1-S2, no murmurs GI: Soft, nondistended, bowel sounds present, mild right upper quadrant tenderness Musculoskeletal: Warm, no edema  CBC Recent Labs  Lab 08/25/2018 1528 08/27/18 0919  WBC 0.7* 0.8*  HGB 6.6* 8.0*  HCT 19.5* 23.1*  PLT 28* 25*  MCV 90.7 88.2  MCH 30.7 30.5  MCHC 33.8 34.6  RDW 14.0 14.4  LYMPHSABS 0.3* 0.5*  MONOABS 0.1 0.1  EOSABS 0.0 0.0  BASOSABS 0.0 0.0    Chemistries  Recent Labs  Lab 08/01/2018 1440 08/27/18 0919  NA 133* 135  K 3.7 3.8  CL 101 104  CO2 19* 19*  GLUCOSE 176* 264*  BUN 28* 25*  CREATININE 2.67* 2.37*  CALCIUM 8.3* 7.8*  AST 16  --   ALT 13  --   ALKPHOS 45  --   BILITOT 0.6  --    ------------------------------------------------------------------------------------------------------------------ No results for input(s): CHOL, HDL, LDLCALC, TRIG, CHOLHDL, LDLDIRECT in the last 72 hours.  Lab Results  Component Value Date   HGBA1C 11.8 (H) 08/13/2015   ------------------------------------------------------------------------------------------------------------------ No results  for input(s): TSH, T4TOTAL, T3FREE, THYROIDAB in the last 72 hours.  Invalid input(s): FREET3 ------------------------------------------------------------------------------------------------------------------ No results for  input(s): VITAMINB12, FOLATE, FERRITIN, TIBC, IRON, RETICCTPCT in the last 72 hours.  Coagulation profile No results for input(s): INR, PROTIME in the last 168 hours.  No results for input(s): DDIMER in the last 72 hours.  Cardiac Enzymes No results for input(s): CKMB, TROPONINI, MYOGLOBIN in the last 168 hours.  Invalid input(s): CK ------------------------------------------------------------------------------------------------------------------ No results found for: BNP  Inpatient Medications  Scheduled Meds: . amLODipine  10 mg Oral q morning - 10a  . insulin glargine  18 Units Subcutaneous QHS  . Tbo-filgastrim (GRANIX) SQ  480 mcg Subcutaneous ONCE-1800  . vitamin B-12  100 mcg Oral q morning - 10a   Continuous Infusions: . sodium chloride 125 mL/hr at 08/01/2018 2031  . ceFEPime (MAXIPIME) IV 1 g (08/13/2018 2031)   PRN Meds:.acetaminophen **OR** acetaminophen, bisacodyl, ondansetron **OR** ondansetron (ZOFRAN) IV  Micro Results Recent Results (from the past 240 hour(s))  SARS Coronavirus 2 (CEPHEID- Performed in [place] hospital lab), Hosp Order     Status: None   Collection Time: 08/02/2018  6:20 PM   Specimen: Nasopharyngeal Swab  Result Value Ref Range Status   SARS Coronavirus 2 NEGATIVE NEGATIVE Final    Comment: (NOTE) If result is NEGATIVE SARS-CoV-2 target nucleic acids are NOT DETECTED. The SARS-CoV-2 RNA is generally detectable in upper and lower  respiratory specimens during the acute phase of infection. The lowest  concentration of SARS-CoV-2 viral copies this assay can detect is 250  copies / mL. A negative result does not preclude SARS-CoV-2 infection  and should not be used as the sole basis for treatment or other  patient management decisions.  A negative result may occur with  improper specimen collection / handling, submission of specimen other  than nasopharyngeal swab, presence of viral mutation(s) within the  areas targeted by this assay, and  inadequate number of viral copies  (<250 copies / mL). A negative result must be combined with clinical  observations, patient history, and epidemiological information. If result is POSITIVE SARS-CoV-2 target nucleic acids are DETECTED. The SARS-CoV-2 RNA is generally detectable in upper and lower  respiratory specimens dur ing the acute phase of infection.  Positive  results are indicative of active infection with SARS-CoV-2.  Clinical  correlation with patient history and other diagnostic information is  necessary to determine patient infection status.  Positive results do  not rule out bacterial infection or co-infection with other viruses. If result is PRESUMPTIVE POSTIVE SARS-CoV-2 nucleic acids MAY BE PRESENT.   A presumptive positive result was obtained on the submitted specimen  and confirmed on repeat testing.  While 2019 novel coronavirus  (SARS-CoV-2) nucleic acids may be present in the submitted sample  additional confirmatory testing may be necessary for epidemiological  and / or clinical management purposes  to differentiate between  SARS-CoV-2 and other Sarbecovirus currently known to infect humans.  If clinically indicated additional testing with an alternate test  methodology [phone]) is advised. The SARS-CoV-2 RNA is generally  detectable in upper and lower respiratory sp ecimens during the acute  phase of infection. The expected result is Negative. Fact Sheet for Patients:  StrictlyIdeas.no Fact Sheet for Healthcare Providers: BankingDealers.co.za This test is not yet approved or cleared by the [place] FDA and has been authorized for detection and/or diagnosis of SARS-CoV-2 by FDA under an Emergency Use Authorization (EUA).  This EUA will remain in effect (meaning this  test can be used) for the duration of the COVID-19 declaration under Section 564(b)(1) of the Act, 21 U.S.C. section 360bbb-3(b)(1), unless the  authorization is terminated or revoked sooner. Performed at [facility], [place] [address] [place], [place] [address]   Culture, blood (routine x 2)     Status: None (Preliminary result)   Collection Time: 08/04/2018  8:59 PM   Specimen: BLOOD RIGHT HAND  Result Value Ref Range Status   Specimen Description BLOOD RIGHT HAND  Final   Special Requests   Final    BOTTLES DRAWN AEROBIC ONLY Blood Culture adequate volume   Culture   Final    NO GROWTH < 12 HOURS Performed at [facility], [place] [address], [place], [place] [address]    Report Status PENDING  Incomplete  Culture, blood (routine x 2)     Status: None (Preliminary result)   Collection Time: 08/11/2018  8:59 PM   Specimen: BLOOD LEFT HAND  Result Value Ref Range Status   Specimen Description BLOOD LEFT HAND  Final   Special Requests   Final    BOTTLES DRAWN AEROBIC ONLY Blood Culture adequate volume   Culture   Final    NO GROWTH < 12 HOURS Performed at [facility], [place] [address] [place], [place] [address]    Report Status PENDING  Incomplete    Radiology Reports Ct Abdomen Pelvis Wo Contrast  Result Date: 08/13/2018 CLINICAL DATA:  78 y/o  F; abdominal pain. EXAM: CT ABDOMEN AND PELVIS WITHOUT CONTRAST TECHNIQUE: Multidetector CT imaging of the abdomen and pelvis was performed following the standard protocol without IV contrast. COMPARISON:  07/08/2018 and 06/10/2016 CT of abdomen and pelvis. FINDINGS: Lower chest: Small right pleural effusion. Partially visualized consolidations within the right middle lobe, right lower lobe, and left medial lower lobe. New oblong 10 mm nodule in left lung base, probably mucoid impaction of an airway. Hepatobiliary: No focal liver abnormality is seen. No gallstones, gallbladder wall thickening, or biliary dilatation. Pancreas: Unremarkable. No pancreatic ductal dilatation or surrounding inflammatory changes. Spleen: Normal in size without focal abnormality. Adrenals/Urinary  Tract: Normal adrenal glands. Multiple hypodense foci within the kidneys bilaterally with cyst on prior studies, suboptimal assessment without contrast. Bilateral punctate nonobstructive nephrolithiasis. No right hydronephrosis. Mild left hydronephrosis without obstructing stone or mass. Mild diffuse bladder wall thickening. Stomach/Bowel: Stomach is within normal limits. Appendix appears normal. No evidence of bowel wall thickening, distention, or inflammatory changes. Contrast extends to the descending colon. Vascular/Lymphatic: Aortic atherosclerosis. No enlarged abdominal or pelvic lymph nodes. Reproductive: Status post hysterectomy. No adnexal masses. Other: Small paraumbilical hernia containing fat. No abdominopelvic ascites. Musculoskeletal: No fracture is seen. IMPRESSION: 1. Mild left hydronephrosis without obstructing stone or mass identified. Findings may represent infection of the collecting system or possibly a recently passed stone. 2. Mild diffuse bladder wall thickening compatible with cystitis. 3. Small right pleural effusion partially visualized consolidations greater in the left lung, suspected pneumonia. 4. Bilateral nonobstructive nephrolithiasis. Electronically Signed   By: Kristine Garbe M.D.   On: 08/01/2018 23:34   Dg Chest Port 1 View  Result Date: 08/20/2018 CLINICAL DATA:  Nausea and vomiting. EXAM: PORTABLE CHEST 1 VIEW COMPARISON:  March since 2018 FINDINGS: There is no apparent airspace opacity involving the right middle lobe and right lower lobe. The heart size is borderline enlarged. There is no pneumothorax. No large pleural effusion. There is some mild elevation of the right hemidiaphragm. There is no acute osseous  abnormality. IMPRESSION: Airspace opacity involving the right middle lobe and right lower lobe concerning for developing pneumonia. Follow-up to radiologic resolution is recommended. Electronically Signed   By: Constance Holster M.D.   On: 08/05/2018 18:57     Time Spent in minutes 35   Ziyon Cedotal M.D on 08/27/2018 at 11:40 AM  Between 7am to 7pm - Pager - [phone]  After 7pm go to [email or web address] - password [facility]  [facility] -  Office  [phone]

## 2018-08-28 DIAGNOSIS — R6510 Systemic inflammatory response syndrome (SIRS) of non-infectious origin without acute organ dysfunction: Secondary | ICD-10-CM

## 2018-08-28 DIAGNOSIS — E1165 Type 2 diabetes mellitus with hyperglycemia: Secondary | ICD-10-CM

## 2018-08-28 DIAGNOSIS — N179 Acute kidney failure, unspecified: Secondary | ICD-10-CM | POA: Diagnosis present

## 2018-08-28 DIAGNOSIS — N183 Chronic kidney disease, stage 3 unspecified: Secondary | ICD-10-CM | POA: Diagnosis present

## 2018-08-28 DIAGNOSIS — Z794 Long term (current) use of insulin: Secondary | ICD-10-CM

## 2018-08-28 DIAGNOSIS — N170 Acute kidney failure with tubular necrosis: Secondary | ICD-10-CM

## 2018-08-28 DIAGNOSIS — J189 Pneumonia, unspecified organism: Secondary | ICD-10-CM | POA: Diagnosis present

## 2018-08-28 LAB — BASIC METABOLIC PANEL
Anion gap: 11 (ref 5–15)
BUN: 26 mg/dL — ABNORMAL HIGH (ref 8–23)
CO2: 18 mmol/L — ABNORMAL LOW (ref 22–32)
Calcium: 7.9 mg/dL — ABNORMAL LOW (ref 8.9–10.3)
Chloride: 106 mmol/L (ref 98–111)
Creatinine, Ser: 2.33 mg/dL — ABNORMAL HIGH (ref 0.44–1.00)
GFR calc Af Amer: 23 mL/min — ABNORMAL LOW (ref >60)
GFR calc non Af Amer: 20 mL/min — ABNORMAL LOW (ref >60)
Glucose, Bld: 138 mg/dL — ABNORMAL HIGH (ref 70–99)
Potassium: 4.6 mmol/L (ref 3.5–5.1)
Sodium: 135 mmol/L (ref 135–145)

## 2018-08-28 LAB — CBC WITH DIFFERENTIAL/PLATELET
Abs Immature Granulocytes: 0.06 10*3/uL (ref 0.00–0.07)
Basophils Absolute: 0 10*3/uL (ref 0.0–0.1)
Basophils Relative: 1 %
Eosinophils Absolute: 0 10*3/uL (ref 0.0–0.5)
Eosinophils Relative: 0 %
HCT: 28 % — ABNORMAL LOW (ref 36.0–46.0)
Hemoglobin: 9.7 g/dL — ABNORMAL LOW (ref 12.0–15.0)
Immature Granulocytes: 1 %
Lymphocytes Relative: 28 %
Lymphs Abs: 1.5 10*3/uL (ref 0.7–4.0)
MCH: 30.8 pg (ref 26.0–34.0)
MCHC: 34.6 g/dL (ref 30.0–36.0)
MCV: 88.9 fL (ref 80.0–100.0)
Monocytes Absolute: 0.2 10*3/uL (ref 0.1–1.0)
Monocytes Relative: 4 %
Neutro Abs: 3.7 10*3/uL (ref 1.7–7.7)
Neutrophils Relative %: 66 %
Platelets: 26 10*3/uL — CL (ref 150–400)
RBC: 3.15 MIL/uL — ABNORMAL LOW (ref 3.87–5.11)
RDW: 14.6 % (ref 11.5–15.5)
WBC: 5.6 10*3/uL (ref 4.0–10.5)
nRBC: 0.9 % — ABNORMAL HIGH (ref 0.0–0.2)

## 2018-08-28 LAB — OSMOLALITY, URINE: Osmolality, Ur: 363 mOsm/kg (ref 300–900)

## 2018-08-28 LAB — SODIUM, URINE, RANDOM: Sodium, Ur: 32 mmol/L

## 2018-08-28 LAB — URINE CULTURE: Culture: <10000 — AB

## 2018-08-28 LAB — GLUCOSE, CAPILLARY
Glucose-Capillary: 134 mg/dL — ABNORMAL HIGH (ref 70–99)
Glucose-Capillary: 138 mg/dL — ABNORMAL HIGH (ref 70–99)
Glucose-Capillary: 163 mg/dL — ABNORMAL HIGH (ref 70–99)
Glucose-Capillary: 140 mg/dL — ABNORMAL HIGH (ref 70–99)

## 2018-08-28 LAB — PROTEIN ELECTROPHORESIS, SERUM
A/G Ratio: 1 (ref 0.7–1.7)
Albumin ELP: 3 g/dL (ref 2.9–4.4)
Alpha-1-Globulin: 0.3 g/dL (ref 0.0–0.4)
Alpha-2-Globulin: 0.8 g/dL (ref 0.4–1.0)
Beta Globulin: 0.7 g/dL (ref 0.7–1.3)
Gamma Globulin: 1 g/dL (ref 0.4–1.8)
Globulin, Total: 2.9 g/dL (ref 2.2–3.9)
Total Protein ELP: 5.9 g/dL — ABNORMAL LOW (ref 6.0–8.5)

## 2018-08-28 NOTE — Progress Notes (Signed)
PROGRESS NOTE                                                                                                                                                                                                             Patient Demographics:    Ruth Douglas, is a 78 y.o. female, DOB - [birth year], ZSM:[id number]  Admit date - 08/12/2018   Admitting Physician Louellen Molder, MD  Outpatient Primary MD for the patient is Majel Homer, [place]  LOS - 2  Outpatient Specialists: None  Chief Complaint  Patient presents with   Nausea   Urinary Frequency       Brief Narrative   78 year old female with diabetes mellitus on insulin, chronic kidney disease stage III (baseline creatinine 1.2-1.4), left ureteral strictures with recurrent UTIs, hypertension who was hospitalized 5 weeks back at [facility] with E. coli UTI and found to be pancytopenic.  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  She was offered treatment but wished to follow-up at [facility] where she lives.  She was seen by the hematologist 3 weeks back when she was still pancytopenic. Patient presented to the ED with abdominal fullness, dysuria and nausea along with right upper quadrant pain.  Blood work showed severe pancytopenia with ANC <300, hemoglobin of 6.6 and platelets of 38.  Also found to have acute kidney injury with creatinine of 2.67. Ordered for PRBC transfusion and admission to telemetry.  Chest x-ray and CT of the abdomen showing bilateral lower lobe pneumonia.  Also shows cystitis and?  Pyelonephritis versus recently passed stone.  COVID-19 was tested negative.    Subjective:   No further nausea.  Feels tired.  T-max of 102 F overnight.  Denies further dysuria.  Right upper quadrant pain is better.  Assessment  & Plan :    Principal Problem:  pancytopenia ([place]) Myelodysplastic syndrome  (HCC) Received 2 unit PRBC with improvement in her hemoglobin.  Neutropenia resolved with Granix received yesterday.  Still has low platelets but does not need transfusion.  Monitor for bleeding.  Hematology consult appreciated.  Continue neutropenic precaution.  Check SPEP and IFE.  LDH elevated (398) Hematology (Dr Lindi Adie) following.  Hypo-methylating agent (HMA). I asked her if she was interested to see hematologist in [place] when she gets discharged and she agrees for it.  SIRS  with Healthcare associated pneumonia Recurrent cystitis Blood culture negative for growth.  Urine culture with insignificant growth (Urine was sent after antibiotic initiated). on empiric cefepime.  Active Problems: Acute kidney injury superimposed on chronic kidney disease stage III Follow urine sodium and osmolality.  Could be prerenal ATN but also suspicious of ATN secondary to MDS.  Renal function improving very slowly.  Continue IV fluids, avoid nephrotoxins.  SPEP and IFE pending.  Check urine lites.  CT abdomen shows may represent infection versus recently passed stone.  Also shows diffuse bladder thickening suggestive of cystitis. If no improvement in renal function despite another day of IV fluids needs nephrology consult.     DM2 (diabetes mellitus, type 2), uncontrolled with hyperglycemia (HCC) Increased Lantus to 22 units and add pre-meal aspart with improvement in her blood sugar.  A1c of 6.9.    Essential hypertension Stable.  Resume home meds      Code Status : Full code  Family Communication  : I will update her son.  Disposition Plan  : Pending clinical improvement  Barriers For Discharge : Active symptoms  Consults  : Dr. Lindi Adie  Procedures  : CT abdomen and pelvis without contrast  DVT Prophylaxis  : SCDs  Lab Results  Component Value Date   PLT 26 (LL) 08/28/2018    Antibiotics  :    Anti-infectives (From admission, onward)   Start     Dose/Rate Route Frequency  Ordered Stop   08/19/2018 1900  ceFEPIme (MAXIPIME) 1 g in sodium chloride 0.9 % 100 mL IVPB     1 g 200 mL/hr over 30 Minutes Intravenous Every 24 hours 08/20/2018 1825          Objective:   Vitals:   08/28/18 0209 08/28/18 0433 08/28/18 0436 08/28/18 0944  BP:  (!) 146/55  (!) 155/62  Pulse:  81    Resp:  16    Temp: 98.9 F (37.2 C) 99.3 F (37.4 C)  99.9 F (37.7 C)  TempSrc: Oral Oral  Oral  SpO2:  97%    Weight:   77.4 kg   Height:        Wt Readings from Last 3 Encounters:  08/28/18 77.4 kg  08/13/15 68.6 kg  10/02/14 61.2 kg     Intake/Output Summary (Last 24 hours) at 08/28/2018 1022 Last data filed at 08/28/2018 0952 Gross per 24 hour  Intake 1140 ml  Output 825 ml  Net 315 ml    Physical exam Fatigued HEENT: Pallor +, moist mucosa, supple neck Chest: Left basilar crackles CVs: Normal S1 and S2 GI: Soft, nondistended, nontender, right upper quadrant pain resolved Musculoskeletal: Warm, no edema, amputated bilateral distal foot    CBC Recent Labs  Lab 08/14/2018 1528 08/27/18 0919 08/27/18 1841 08/28/18 0654  WBC 0.7* 0.8*  --  5.6  HGB 6.6* 8.0* 10.9* 9.7*  HCT 19.5* 23.1* 31.2* 28.0*  PLT 28* 25*  --  26*  MCV 90.7 88.2  --  88.9  MCH 30.7 30.5  --  30.8  MCHC 33.8 34.6  --  34.6  RDW 14.0 14.4  --  14.6  LYMPHSABS 0.3* 0.5*  --  1.5  MONOABS 0.1 0.1  --  0.2  EOSABS 0.0 0.0  --  0.0  BASOSABS 0.0 0.0  --  0.0    Chemistries  Recent Labs  Lab 08/28/2018 1440 08/27/18 0919 08/28/18 0654  NA 133* 135 135  K 3.7 3.8 4.6  CL 101 104 106  CO2 19* 19* 18*  GLUCOSE 176* 264* 138*  BUN 28* 25* 26*  CREATININE 2.67* 2.37* 2.33*  CALCIUM 8.3* 7.8* 7.9*  AST 16  --   --   ALT 13  --   --   ALKPHOS 45  --   --   BILITOT 0.6  --   --    ------------------------------------------------------------------------------------------------------------------ No results for input(s): CHOL, HDL, LDLCALC, TRIG, CHOLHDL, LDLDIRECT in the last 72  hours.  Lab Results  Component Value Date   HGBA1C 6.9 (H) 08/27/2018   ------------------------------------------------------------------------------------------------------------------ No results for input(s): TSH, T4TOTAL, T3FREE, THYROIDAB in the last 72 hours.  Invalid input(s): FREET3 ------------------------------------------------------------------------------------------------------------------ No results for input(s): VITAMINB12, FOLATE, FERRITIN, TIBC, IRON, RETICCTPCT in the last 72 hours.  Coagulation profile No results for input(s): INR, PROTIME in the last 168 hours.  No results for input(s): DDIMER in the last 72 hours.  Cardiac Enzymes No results for input(s): CKMB, TROPONINI, MYOGLOBIN in the last 168 hours.  Invalid input(s): CK ------------------------------------------------------------------------------------------------------------------ No results found for: BNP  Inpatient Medications  Scheduled Meds:  amLODipine  10 mg Oral q morning - 10a   insulin aspart  0-15 Units Subcutaneous TID WC   insulin aspart  0-5 Units Subcutaneous QHS   insulin aspart  3 Units Subcutaneous TID WC   insulin glargine  22 Units Subcutaneous QHS   vitamin B-12  100 mcg Oral q morning - 10a   Continuous Infusions:  sodium chloride 125 mL/hr at 08/28/18 0135   ceFEPime (MAXIPIME) IV 1 g (08/27/18 2057)   PRN Meds:.acetaminophen **OR** acetaminophen, bisacodyl, ondansetron **OR** ondansetron (ZOFRAN) IV  Micro Results Recent Results (from the past 240 hour(s))  SARS Coronavirus 2 (CEPHEID- Performed in [place] hospital lab), Hosp Order     Status: None   Collection Time: 08/08/2018  6:20 PM   Specimen: Nasopharyngeal Swab  Result Value Ref Range Status   SARS Coronavirus 2 NEGATIVE NEGATIVE Final    Comment: (NOTE) If result is NEGATIVE SARS-CoV-2 target nucleic acids are NOT DETECTED. The SARS-CoV-2 RNA is generally detectable in upper and lower   respiratory specimens during the acute phase of infection. The lowest  concentration of SARS-CoV-2 viral copies this assay can detect is 250  copies / mL. A negative result does not preclude SARS-CoV-2 infection  and should not be used as the sole basis for treatment or other  patient management decisions.  A negative result may occur with  improper specimen collection / handling, submission of specimen other  than nasopharyngeal swab, presence of viral mutation(s) within the  areas targeted by this assay, and inadequate number of viral copies  (<250 copies / mL). A negative result must be combined with clinical  observations, patient history, and epidemiological information. If result is POSITIVE SARS-CoV-2 target nucleic acids are DETECTED. The SARS-CoV-2 RNA is generally detectable in upper and lower  respiratory specimens dur ing the acute phase of infection.  Positive  results are indicative of active infection with SARS-CoV-2.  Clinical  correlation with patient history and other diagnostic information is  necessary to determine patient infection status.  Positive results do  not rule out bacterial infection or co-infection with other viruses. If result is PRESUMPTIVE POSTIVE SARS-CoV-2 nucleic acids MAY BE PRESENT.   A presumptive positive result was obtained on the submitted specimen  and confirmed on repeat testing.  While 2019 novel coronavirus  (SARS-CoV-2) nucleic acids may be present in the submitted sample  additional confirmatory testing may be necessary for  epidemiological  and / or clinical management purposes  to differentiate between  SARS-CoV-2 and other Sarbecovirus currently known to infect humans.  If clinically indicated additional testing with an alternate test  methodology [phone]) is advised. The SARS-CoV-2 RNA is generally  detectable in upper and lower respiratory sp ecimens during the acute  phase of infection. The expected result is Negative. Fact  Sheet for Patients:  StrictlyIdeas.no Fact Sheet for Healthcare Providers: BankingDealers.co.za This test is not yet approved or cleared by the [place] FDA and has been authorized for detection and/or diagnosis of SARS-CoV-2 by FDA under an Emergency Use Authorization (EUA).  This EUA will remain in effect (meaning this test can be used) for the duration of the COVID-19 declaration under Section 564(b)(1) of the Act, 21 U.S.C. section 360bbb-3(b)(1), unless the authorization is terminated or revoked sooner. Performed at [facility], [place] [address], [place], [place] [address]   Culture, blood (routine x 2)     Status: None (Preliminary result)   Collection Time: 08/07/2018  8:59 PM   Specimen: BLOOD RIGHT HAND  Result Value Ref Range Status   Specimen Description BLOOD RIGHT HAND  Final   Special Requests   Final    BOTTLES DRAWN AEROBIC ONLY Blood Culture adequate volume   Culture   Final    NO GROWTH 2 DAYS Performed at [facility], [place] [address], [place], [place] [address]    Report Status PENDING  Incomplete  Culture, blood (routine x 2)     Status: None (Preliminary result)   Collection Time: 08/02/2018  8:59 PM   Specimen: BLOOD LEFT HAND  Result Value Ref Range Status   Specimen Description BLOOD LEFT HAND  Final   Special Requests   Final    BOTTLES DRAWN AEROBIC ONLY Blood Culture adequate volume   Culture   Final    NO GROWTH 2 DAYS Performed at [facility], [place] [address] [place], [place] [address]    Report Status PENDING  Incomplete  Culture, Urine     Status: Abnormal   Collection Time: 08/27/18  8:43 AM   Specimen: Urine, Random  Result Value Ref Range Status   Specimen Description URINE, RANDOM  Final   Special Requests NONE  Final   Culture (A)  Final    <10,000 COLONIES/mL INSIGNIFICANT GROWTH Performed at [place] [address] [place], [place] [address]    Report  Status 08/28/2018 FINAL  Final    Radiology Reports Ct Abdomen Pelvis Wo Contrast  Result Date: 08/11/2018 CLINICAL DATA:  78 y/o  F; abdominal pain. EXAM: CT ABDOMEN AND PELVIS WITHOUT CONTRAST TECHNIQUE: Multidetector CT imaging of the abdomen and pelvis was performed following the standard protocol without IV contrast. COMPARISON:  07/08/2018 and 06/10/2016 CT of abdomen and pelvis. FINDINGS: Lower chest: Small right pleural effusion. Partially visualized consolidations within the right middle lobe, right lower lobe, and left medial lower lobe. New oblong 10 mm nodule in left lung base, probably mucoid impaction of an airway. Hepatobiliary: No focal liver abnormality is seen. No gallstones, gallbladder wall thickening, or biliary dilatation. Pancreas: Unremarkable. No pancreatic ductal dilatation or surrounding inflammatory changes. Spleen: Normal in size without focal abnormality. Adrenals/Urinary Tract: Normal adrenal glands. Multiple hypodense foci within the kidneys bilaterally with cyst on prior studies, suboptimal assessment without contrast. Bilateral punctate nonobstructive nephrolithiasis. No right hydronephrosis. Mild left hydronephrosis without obstructing stone or mass. Mild diffuse bladder wall thickening. Stomach/Bowel: Stomach is  within normal limits. Appendix appears normal. No evidence of bowel wall thickening, distention, or inflammatory changes. Contrast extends to the descending colon. Vascular/Lymphatic: Aortic atherosclerosis. No enlarged abdominal or pelvic lymph nodes. Reproductive: Status post hysterectomy. No adnexal masses. Other: Small paraumbilical hernia containing fat. No abdominopelvic ascites. Musculoskeletal: No fracture is seen. IMPRESSION: 1. Mild left hydronephrosis without obstructing stone or mass identified. Findings may represent infection of the collecting system or possibly a recently passed stone. 2. Mild diffuse bladder wall thickening compatible with cystitis.  3. Small right pleural effusion partially visualized consolidations greater in the left lung, suspected pneumonia. 4. Bilateral nonobstructive nephrolithiasis. Electronically Signed   By: Kristine Garbe M.D.   On: 08/01/2018 23:34   Dg Chest Port 1 View  Result Date: 07/31/2018 CLINICAL DATA:  Nausea and vomiting. EXAM: PORTABLE CHEST 1 VIEW COMPARISON:  March since 2018 FINDINGS: There is no apparent airspace opacity involving the right middle lobe and right lower lobe. The heart size is borderline enlarged. There is no pneumothorax. No large pleural effusion. There is some mild elevation of the right hemidiaphragm. There is no acute osseous abnormality. IMPRESSION: Airspace opacity involving the right middle lobe and right lower lobe concerning for developing pneumonia. Follow-up to radiologic resolution is recommended. Electronically Signed   By: Constance Holster M.D.   On: 08/14/2018 18:57    Time Spent in minutes 35   Milaina Sher M.D on 08/28/2018 at 10:22 AM  Between 7am to 7pm - Pager - [phone]  After 7pm go to [email or web address] - password [facility]  [facility] -  Office  [phone]

## 2018-08-29 ENCOUNTER — Inpatient Hospital Stay (HOSPITAL_COMMUNITY): Payer: Medicare HMO

## 2018-08-29 LAB — CBC WITH DIFFERENTIAL/PLATELET
Abs Immature Granulocytes: 0.04 10*3/uL (ref 0.00–0.07)
Basophils Absolute: 0 10*3/uL (ref 0.0–0.1)
Basophils Relative: 0 %
Eosinophils Absolute: 0 10*3/uL (ref 0.0–0.5)
Eosinophils Relative: 0 %
HCT: 26 % — ABNORMAL LOW (ref 36.0–46.0)
Hemoglobin: 8.8 g/dL — ABNORMAL LOW (ref 12.0–15.0)
Immature Granulocytes: 1 %
Lymphocytes Relative: 35 %
Lymphs Abs: 1 10*3/uL (ref 0.7–4.0)
MCH: 30.3 pg (ref 26.0–34.0)
MCHC: 33.8 g/dL (ref 30.0–36.0)
MCV: 89.7 fL (ref 80.0–100.0)
Monocytes Absolute: 0.1 10*3/uL (ref 0.1–1.0)
Monocytes Relative: 3 %
Neutro Abs: 1.8 10*3/uL (ref 1.7–7.7)
Neutrophils Relative %: 61 %
Platelets: 22 10*3/uL — CL (ref 150–400)
RBC: 2.9 MIL/uL — ABNORMAL LOW (ref 3.87–5.11)
RDW: 14.6 % (ref 11.5–15.5)
WBC: 2.9 10*3/uL — ABNORMAL LOW (ref 4.0–10.5)
nRBC: 1 % — ABNORMAL HIGH (ref 0.0–0.2)

## 2018-08-29 LAB — IMMUNOFIXATION ELECTROPHORESIS
IgA: 345 mg/dL (ref 64–422)
IgG (Immunoglobin G), Serum: 1134 mg/dL (ref 586–1602)
IgM (Immunoglobulin M), Srm: 68 mg/dL (ref 26–217)
Total Protein ELP: 6 g/dL (ref 6.0–8.5)

## 2018-08-29 LAB — BASIC METABOLIC PANEL
Anion gap: 9 (ref 5–15)
BUN: 23 mg/dL (ref 8–23)
CO2: 19 mmol/L — ABNORMAL LOW (ref 22–32)
Calcium: 7.9 mg/dL — ABNORMAL LOW (ref 8.9–10.3)
Chloride: 107 mmol/L (ref 98–111)
Creatinine, Ser: 2.15 mg/dL — ABNORMAL HIGH (ref 0.44–1.00)
GFR calc Af Amer: 25 mL/min — ABNORMAL LOW (ref >60)
GFR calc non Af Amer: 22 mL/min — ABNORMAL LOW (ref >60)
Glucose, Bld: 153 mg/dL — ABNORMAL HIGH (ref 70–99)
Potassium: 4.1 mmol/L (ref 3.5–5.1)
Sodium: 135 mmol/L (ref 135–145)

## 2018-08-29 LAB — GLUCOSE, CAPILLARY
Glucose-Capillary: 154 mg/dL — ABNORMAL HIGH (ref 70–99)
Glucose-Capillary: 168 mg/dL — ABNORMAL HIGH (ref 70–99)
Glucose-Capillary: 149 mg/dL — ABNORMAL HIGH (ref 70–99)
Glucose-Capillary: 159 mg/dL — ABNORMAL HIGH (ref 70–99)
Glucose-Capillary: 194 mg/dL — ABNORMAL HIGH (ref 70–99)

## 2018-08-29 MED ORDER — CEFDINIR 300 MG PO CAPS
300.0000 mg | ORAL_CAPSULE | Freq: Every day | ORAL | Status: DC
  Administered 2018-08-29 – 2018-08-30 (×2): 300 mg via ORAL
  Filled 2018-08-29 (×2): qty 1

## 2018-08-29 MED ORDER — METOCLOPRAMIDE HCL 5 MG/ML IJ SOLN
5.0000 mg | Freq: Three times a day (TID) | INTRAMUSCULAR | Status: DC
  Administered 2018-08-29 (×2): 5 mg via INTRAVENOUS
  Filled 2018-08-29 (×2): qty 2

## 2018-08-29 MED ORDER — SENNOSIDES-DOCUSATE SODIUM 8.6-50 MG PO TABS
1.0000 | ORAL_TABLET | Freq: Two times a day (BID) | ORAL | Status: DC
  Administered 2018-08-29 – 2018-09-02 (×8): 1 via ORAL
  Filled 2018-08-29 (×8): qty 1

## 2018-08-29 MED ORDER — GUAIFENESIN-DM 100-10 MG/5ML PO SYRP
5.0000 mL | ORAL_SOLUTION | ORAL | Status: DC | PRN
  Administered 2018-08-29: 5 mL via ORAL
  Filled 2018-08-29: qty 5

## 2018-08-29 MED ORDER — METOCLOPRAMIDE HCL 5 MG/ML IJ SOLN: 10.0000 mg | Freq: Three times a day (TID) | INTRAMUSCULAR | Status: DC

## 2018-08-29 MED ORDER — METOCLOPRAMIDE HCL 5 MG/ML IJ SOLN
10.0000 mg | Freq: Four times a day (QID) | INTRAMUSCULAR | Status: DC
  Administered 2018-08-29 – 2018-09-03 (×17): 10 mg via INTRAVENOUS
  Filled 2018-08-29 (×17): qty 2

## 2018-08-29 NOTE — Progress Notes (Signed)
Patient's son [name] updated with plan of care and all questions answered.

## 2018-08-29 NOTE — Plan of Care (Signed)
  Problem: Health Behavior/Discharge Planning: Goal: Ability to manage health-related needs will improve Outcome: Progressing   

## 2018-08-29 NOTE — Progress Notes (Signed)
[facility] Progress Note  Patient: Ruth Douglas VFI:[id number]   PCP: Majel Homer, PA DOB: 10-20-1940   DOA: 08/09/2018   DOS: 08/29/2018   Date of Service: the patient was seen and examined on 08/29/2018  Brief hospital course: Pt. with PMH of type II DM, CKD 3, recurrent UTI, HTN, myelodysplastic syndrome; admitted on 08/19/2018, presented with complaint of abdominal fullness, dysuria, nausea, right upper quadrant pain, was found to have AKI, symptomatic anemia, bilateral lower lobe pneumonia, pyelonephritis.  SARS-CoV-2 negative Currently further plan is continue IV biotics.  Subjective: Fever curve improving, reports recurrent nausea and vomiting.  No abdominal pain.  No fever no chills.  Assessment and Plan: 1. AKI on chronic kidney disease stage III Baseline serum creatinine 1.5-1.75 On presentation 2.67 with elevated BUN and hyponatremia. Currently 2.15, normal sodium normal potassium. Still not back to baseline.  We will reduce the fluid and monitor patient's response. Strict in and out.  Urine output is significantly less.  325 cc in the last 24 hours.  2. UTI recurrent, sepsis ruled out UA nitrite negative Urine culture insignificant growth. Recent urine culture on 08/17/2018 at [facility] grew significant E. coli, resistant to ampicillin and Augmentin. CT renal shows mild left hydronephrosis without stone, diffuse bladder wall thickening and bilateral nonobstructive renal stone. Patient has known history of urological issues on the left ureter with recurrent UTI and follows up with urology.  Patient's pain is primarily on the right flank. Blood cultures negative for 2 days.  Currently being treated with IV cefepime.  Will transition to oral Omnicef. Requires a urology follow-up.  Has seen urology at [facility].  3. Bilateral lower lobe pneumonia.,  Sepsis ruled out CT on chest x-ray shows evidence of bilateral lower lobe pneumonia. Currently on 2 L of oxygen does not use  any oxygen at home. Blood cultures as above are negative. Incentive spirometry. Wean oxygen to room air. Check home oxygen evaluation.  Currently being treated with IV cefepime.  Will transition to oral Omnicef.  4. Recurrent nausea and vomiting. Currently resolved. Likely in the setting of infection. Tolerating full liquid diet.  Advance to soft diet. CT abdomen scan negative for any acute abnormality. Due to persistent nausea and vomiting repeat CT scan was performed, negative.  Scheduled Reglan ordered.  5. Type 2 Diabetes Mellitus, uncontroled with Hyperglycemia, renal complication last hemoglobin A1c was 6.9 this admission hold her home hypoglycemic agents. On insulin sliding scale sensitive with Basal insulin lantus.  6.  Myelodysplastic syndrome. Pancytopenia. Symptomatic anemia. Received 2 PRBC. H&H relatively stable. No active bleeding reported. Received Granix after discussion with hematology. Neutropenia improving. Platelets are relatively low but no active bleeding. Earlier provider discussed with hematology at [facility] who will follow-up with the patient on discharge.  7. Essential hypertension Continue Norvasc 10 mg.  8.  Obesity. Body mass index is 31.32 kg/m.   Diet: Encourage fluids DVT Prophylaxis: SCD, pharmacological prophylaxis contraindicated due to Thrombocytopenia  Advance goals of care discussion: Full code  Family Communication: no family was present at bedside, at the time of interview.   Disposition:  Discharge to Home tomorrow on 08/30/2018.  Consultants: Hematology Procedures: none  Scheduled Meds: . amLODipine  10 mg Oral q morning - 10a  . insulin aspart  0-15 Units Subcutaneous TID WC  . insulin aspart  0-5 Units Subcutaneous QHS  . insulin aspart  3 Units Subcutaneous TID WC  . insulin glargine  22 Units Subcutaneous QHS  . vitamin B-12  100 mcg Oral  q morning - 10a   Continuous Infusions: . sodium chloride 125 mL/hr at  08/29/18 0354  . ceFEPime (MAXIPIME) IV Stopped (08/28/18 2315)   PRN Meds: acetaminophen **OR** acetaminophen, bisacodyl, ondansetron **OR** ondansetron (ZOFRAN) IV Antibiotics: Anti-infectives (From admission, onward)   Start     Dose/Rate Route Frequency Ordered Stop   08/22/2018 1900  ceFEPIme (MAXIPIME) 1 g in sodium chloride 0.9 % 100 mL IVPB     1 g 200 mL/hr over 30 Minutes Intravenous Every 24 hours 08/11/2018 1825         Objective: Physical Exam: Vitals:   08/28/18 1205 08/28/18 1251 08/28/18 1954 08/29/18 0358  BP: (!) 140/57  (!) 157/50 (!) 150/53  Pulse: 86  85 85  Resp: 19  18   Temp: (!) 100.5 F (38.1 C) 99.5 F (37.5 C) 99.3 F (37.4 C) 99.9 F (37.7 C)  TempSrc: Oral  Oral Oral  SpO2: 92%  92% 90%  Weight:    80.2 kg  Height:        Intake/Output Summary (Last 24 hours) at 08/29/2018 0732 Last data filed at 08/29/2018 0600 Gross per 24 hour  Intake 1300 ml  Output 450 ml  Net 850 ml   Filed Weights   08/27/18 0700 08/28/18 0436 08/29/18 0358  Weight: 73.7 kg 77.4 kg 80.2 kg   General: alert and oriented to time, place, and person. Appear in moderate distress, affect appropriate Eyes: PERRL, Conjunctiva normal ENT: Oral Mucosa Clear, dry  Neck: no JVD, no Abnormal Mass Or lumps Cardiovascular: S1 and S2 Present, no Murmur, peripheral pulses symmetrical Respiratory: normal respiratory effort, Bilateral Air entry equal and Decreased, no use of accessory muscle, bilateral Crackles, no wheezes Abdomen: Bowel Sound present, Soft and no tenderness, no hernia Skin: no rashes  Extremities: no Pedal edema, no calf tenderness Neurologic: normal without focal findings, mental status, speech normal, alert and oriented x3, PERLA, Motor strength 5/5 and symmetric and sensation grossly normal to light touch Gait not checked due to patient safety concerns  Data Reviewed: CBC: Recent Labs  Lab 08/10/2018 1528 08/27/18 0919 08/27/18 1841 08/28/18 0654 08/29/18 0225   WBC 0.7* 0.8*  --  5.6 2.9*  NEUTROABS 0.3* 0.2*  --  3.7 1.8  HGB 6.6* 8.0* 10.9* 9.7* 8.8*  HCT 19.5* 23.1* 31.2* 28.0* 26.0*  MCV 90.7 88.2  --  88.9 89.7  PLT 28* 25*  --  26* 22*   Basic Metabolic Panel: Recent Labs  Lab 08/07/2018 1440 08/27/18 0919 08/28/18 0654 08/29/18 0225  NA 133* 135 135 135  K 3.7 3.8 4.6 4.1  CL 101 104 106 107  CO2 19* 19* 18* 19*  GLUCOSE 176* 264* 138* 153*  BUN 28* 25* 26* 23  CREATININE 2.67* 2.37* 2.33* 2.15*  CALCIUM 8.3* 7.8* 7.9* 7.9*    Liver Function Tests: Recent Labs  Lab 07/30/2018 1440  AST 16  ALT 13  ALKPHOS 45  BILITOT 0.6  PROT 7.3  ALBUMIN 3.2*   Recent Labs  Lab 08/21/2018 1440  LIPASE 30   No results for input(s): AMMONIA in the last 168 hours. Coagulation Profile: No results for input(s): INR, PROTIME in the last 168 hours. Cardiac Enzymes: No results for input(s): CKTOTAL, CKMB, CKMBINDEX, TROPONINI in the last 168 hours. BNP (last 3 results) No results for input(s): PROBNP in the last 8760 hours. CBG: Recent Labs  Lab 08/28/18 0620 08/28/18 1209 08/28/18 1616 08/28/18 2119 08/29/18 0550  GLUCAP 134* 138* 163* 140* 154*  Studies: No results found.   Time spent: 35 minutes  Author: Berle Mull, MD [facility] 08/29/2018 7:32 AM  To reach On-call, see care teams to locate the attending and reach out to them via [email or web address]. If 7PM-7AM, please contact night-coverage If you still have difficulty reaching the attending provider, please page the [facility] (Director on Call) for [facility] on amion for assistance.

## 2018-08-29 NOTE — Progress Notes (Signed)
PT Cancellation Note  Patient Details Name: Ruth Douglas MRN: [id number] DOB: 1941/01/03   Cancelled Treatment:    Reason Eval/Treat Not Completed: Medical issues which prohibited therapy Pt with nausea/vomiting and requesting to hold PT. Will follow up as schedule allows.   Leighton Ruff, PT, DPT  Acute Rehabilitation Services  Pager: [phone] Office: [phone]    Ruth Douglas 08/29/2018, 5:56 PM

## 2018-08-29 NOTE — Progress Notes (Signed)
Patient refusing to get out of bed to check oxygen saturation with ambulation.

## 2018-08-29 NOTE — Plan of Care (Signed)

## 2018-08-29 DEATH — deceased

## 2018-08-30 ENCOUNTER — Inpatient Hospital Stay (HOSPITAL_COMMUNITY): Payer: Medicare HMO

## 2018-08-30 LAB — BPAM RBC
Blood Product Expiration Date: 202007182359
Blood Product Expiration Date: 202007192359
Blood Product Expiration Date: 202007192359
Blood Product Expiration Date: 202007242359
ISSUE DATE / TIME: 202006290143
ISSUE DATE / TIME: 202006291118
Unit Type and Rh: 6200
Unit Type and Rh: 6200
Unit Type and Rh: 6200
Unit Type and Rh: 6200

## 2018-08-30 LAB — CBC WITH DIFFERENTIAL/PLATELET
Abs Immature Granulocytes: 0.01 10*3/uL (ref 0.00–0.07)
Basophils Absolute: 0 10*3/uL (ref 0.0–0.1)
Basophils Relative: 0 %
Eosinophils Absolute: 0 10*3/uL (ref 0.0–0.5)
Eosinophils Relative: 0 %
HCT: 25.1 % — ABNORMAL LOW (ref 36.0–46.0)
Hemoglobin: 8.6 g/dL — ABNORMAL LOW (ref 12.0–15.0)
Immature Granulocytes: 0 %
Lymphocytes Relative: 52 %
Lymphs Abs: 1.1 10*3/uL (ref 0.7–4.0)
MCH: 30.9 pg (ref 26.0–34.0)
MCHC: 34.3 g/dL (ref 30.0–36.0)
MCV: 90.3 fL (ref 80.0–100.0)
Monocytes Absolute: 0.1 10*3/uL (ref 0.1–1.0)
Monocytes Relative: 3 %
Neutro Abs: 1 10*3/uL — ABNORMAL LOW (ref 1.7–7.7)
Neutrophils Relative %: 45 %
Platelets: 21 10*3/uL — CL (ref 150–400)
RBC: 2.78 MIL/uL — ABNORMAL LOW (ref 3.87–5.11)
RDW: 14.5 % (ref 11.5–15.5)
WBC: 2.2 10*3/uL — ABNORMAL LOW (ref 4.0–10.5)
nRBC: 0.9 % — ABNORMAL HIGH (ref 0.0–0.2)

## 2018-08-30 LAB — COMPREHENSIVE METABOLIC PANEL
ALT: 10 U/L (ref 0–44)
AST: 15 U/L (ref 15–41)
Albumin: 2.5 g/dL — ABNORMAL LOW (ref 3.5–5.0)
Alkaline Phosphatase: 37 U/L — ABNORMAL LOW (ref 38–126)
Anion gap: 9 (ref 5–15)
BUN: 28 mg/dL — ABNORMAL HIGH (ref 8–23)
CO2: 20 mmol/L — ABNORMAL LOW (ref 22–32)
Calcium: 8.2 mg/dL — ABNORMAL LOW (ref 8.9–10.3)
Chloride: 107 mmol/L (ref 98–111)
Creatinine, Ser: 2.39 mg/dL — ABNORMAL HIGH (ref 0.44–1.00)
GFR calc Af Amer: 22 mL/min — ABNORMAL LOW (ref >60)
GFR calc non Af Amer: 19 mL/min — ABNORMAL LOW (ref >60)
Glucose, Bld: 149 mg/dL — ABNORMAL HIGH (ref 70–99)
Potassium: 4.4 mmol/L (ref 3.5–5.1)
Sodium: 136 mmol/L (ref 135–145)
Total Bilirubin: 0.5 mg/dL (ref 0.3–1.2)
Total Protein: 6 g/dL — ABNORMAL LOW (ref 6.5–8.1)

## 2018-08-30 LAB — GLUCOSE, CAPILLARY
Glucose-Capillary: 165 mg/dL — ABNORMAL HIGH (ref 70–99)
Glucose-Capillary: 193 mg/dL — ABNORMAL HIGH (ref 70–99)
Glucose-Capillary: 194 mg/dL — ABNORMAL HIGH (ref 70–99)
Glucose-Capillary: 152 mg/dL — ABNORMAL HIGH (ref 70–99)

## 2018-08-30 LAB — TYPE AND SCREEN
ABO/RH(D): A POS
Antibody Screen: NEGATIVE
Unit division: 0
Unit division: 0
Unit division: 0
Unit division: 0

## 2018-08-30 LAB — BLOOD GAS, ARTERIAL
Acid-base deficit: 5.3 mmol/L — ABNORMAL HIGH (ref 0.0–2.0)
Bicarbonate: 19.6 mmol/L — ABNORMAL LOW (ref 20.0–28.0)
Drawn by: 246101
O2 Content: 6 L/min
O2 Saturation: 84 %
Patient temperature: 98.6
pCO2 arterial: 38.2 mmHg (ref 32.0–48.0)
pH, Arterial: 7.329 — ABNORMAL LOW (ref 7.350–7.450)
pO2, Arterial: 52 mmHg — ABNORMAL LOW (ref 83.0–108.0)

## 2018-08-30 LAB — MAGNESIUM: Magnesium: 2.2 mg/dL (ref 1.7–2.4)

## 2018-08-30 LAB — LACTIC ACID, PLASMA: Lactic Acid, Venous: 1.1 mmol/L (ref 0.5–1.9)

## 2018-08-30 MED ORDER — CEFDINIR 300 MG PO CAPS: 300.0000 mg | ORAL_CAPSULE | Freq: Two times a day (BID) | ORAL | Status: DC

## 2018-08-30 MED ORDER — TBO-FILGRASTIM 480 MCG/0.8ML ~~LOC~~ SOSY
480.0000 ug | PREFILLED_SYRINGE | Freq: Once | SUBCUTANEOUS | Status: AC
  Administered 2018-08-30: 480 ug via SUBCUTANEOUS
  Filled 2018-08-30 (×2): qty 0.8

## 2018-08-30 MED ORDER — CEFDINIR 300 MG PO CAPS
300.0000 mg | ORAL_CAPSULE | Freq: Every day | ORAL | Status: DC
  Administered 2018-08-31: 300 mg via ORAL
  Filled 2018-08-30: qty 1

## 2018-08-30 MED ORDER — SODIUM CHLORIDE 0.9 % IV SOLN
2.0000 g | INTRAVENOUS | Status: DC
  Filled 2018-08-30: qty 20

## 2018-08-30 MED ORDER — GUAIFENESIN-DM 100-10 MG/5ML PO SYRP
10.0000 mL | ORAL_SOLUTION | Freq: Four times a day (QID) | ORAL | Status: DC
  Administered 2018-08-30 (×2): 10 mL via ORAL
  Filled 2018-08-30 (×2): qty 10

## 2018-08-30 MED ORDER — INSULIN ASPART 100 UNIT/ML ~~LOC~~ SOLN
3.0000 [IU] | Freq: Three times a day (TID) | SUBCUTANEOUS | Status: DC
  Administered 2018-08-30 – 2018-09-01 (×5): 3 [IU] via SUBCUTANEOUS

## 2018-08-30 MED ORDER — FUROSEMIDE 10 MG/ML IJ SOLN
60.0000 mg | Freq: Two times a day (BID) | INTRAMUSCULAR | Status: DC
  Administered 2018-08-30 – 2018-08-31 (×3): 60 mg via INTRAVENOUS
  Filled 2018-08-30 (×3): qty 6

## 2018-08-30 MED ORDER — ALBUMIN HUMAN 25 % IV SOLN
12.5000 g | Freq: Once | INTRAVENOUS | Status: AC
  Administered 2018-08-30: 12.5 g via INTRAVENOUS
  Filled 2018-08-30: qty 50

## 2018-08-30 MED ORDER — AMLODIPINE BESYLATE 5 MG PO TABS: 5.0000 mg | ORAL_TABLET | Freq: Every morning | ORAL | Status: DC

## 2018-08-30 MED ORDER — LEVALBUTEROL HCL 0.63 MG/3ML IN NEBU
0.6300 mg | INHALATION_SOLUTION | Freq: Three times a day (TID) | RESPIRATORY_TRACT | Status: DC
  Administered 2018-08-30 (×2): 0.63 mg via RESPIRATORY_TRACT
  Filled 2018-08-30 (×2): qty 3

## 2018-08-30 NOTE — Care Management Important Message (Signed)
Important Message  Patient Details  Name: Ruth Douglas MRN: [id number] Date of Birth: 06-09-1940   Medicare Important Message Given:  Yes patient on [place] letter to the patient nurse Juliette Mangle.     [name] 08/30/2018, 2:15 PM

## 2018-08-30 NOTE — Progress Notes (Signed)
[facility] Progress Note  Patient: Ruth Douglas AUQ:[id number]   PCP: Majel Homer, PA DOB: [birth year]   DOA: 08/04/2018   DOS: 08/30/2018   Date of Service: the patient was seen and examined on 08/30/2018  Brief hospital course: Pt. with PMH of type II DM, CKD 3, recurrent UTI, HTN, myelodysplastic syndrome; admitted on 08/16/2018, presented with complaint of abdominal fullness, dysuria, nausea, right upper quadrant pain, was found to have AKI, symptomatic anemia, bilateral lower lobe pneumonia, pyelonephritis.  SARS-CoV-2 negative.  Patient is now getting more hypoxic secondary to volume overload and pneumonia. Currently further plan is continue IV biotics.  Subjective: Nausea has resolved.  No further vomiting.  No abdominal pain.  Reports having worsening shortness of breath as well as wheezing and crackling.  No chest pain no abdominal pain reported.  Assessment and Plan: 1. AKI on chronic kidney disease stage III Baseline serum creatinine 1.5-1.75 On presentation 2.67 with elevated BUN and hyponatremia. Currently creatinine around 2, normal sodium normal potassium. Still not back to baseline.   Strict in and out.  Urine output is significantly less.  625cc in the last 24 hours. If no improvement will consult nephrology tomorrow.  2. UTI recurrent, sepsis ruled out UA nitrite negative Urine culture insignificant growth. Recent urine culture on 08/17/2018 at [facility] grew significant E. coli, resistant to ampicillin and Augmentin. CT renal shows mild left hydronephrosis without stone, diffuse bladder wall thickening and bilateral nonobstructive renal stone. Patient has known history of urological issues on the left ureter with recurrent UTI and follows up with urology. Patient's pain is primarily on the right flank. Blood cultures negative for 2 days.  Initially being treated with IV cefepime. Based on the prior culture transition to stop Omnicef.  Given her worsening  respiratory distress we will transition her back to ceftriaxone.  Requires a urology follow-up.  Has seen urology at [facility].  3. Bilateral lower lobe pneumonia.,  Sepsis ruled out Likely in the setting of aspiration from nausea and vomiting Acute hypoxic respiratory failure CT on chest x-ray shows evidence of bilateral lower lobe pneumonia. Currently on 6 L of oxygen does not use any oxygen at home. Blood cultures as above are negative. Incentive spirometry. Wean oxygen to room air. Currently being treated with IV ceftriaxone. We will check ABG. Transfer to stepdown unit for close monitoring and BiPAP support as needed.  4. Recurrent nausea and vomiting. Currently resolved. Likely in the setting of infection. Tolerating full liquid diet.  Advance to soft diet. CT abdomen scan negative for any acute abnormality. Due to persistent nausea and vomiting repeat CT scan was performed, negative.  Scheduled Reglan ordered.  5.  Acute on chronic diastolic CHF. Essential hypertension Volume overload from aggressive IV resuscitation. We will get echocardiogram tomorrow. Continue to monitor on telemetry. IV Lasix 60 mg twice daily.  Will provide albumin support. No improvement will consult nephrology tomorrow. May require cardiology input depending on the echocardiogram. Reduce Norvasc from 10 mg to 5 mg.  6.  Myelodysplastic syndrome. Pancytopenia. Symptomatic anemia. Received 2 PRBC. H&H relatively stable. No active bleeding reported. Received Granix after discussion with hematology. Neutropenia improving. Platelets are relatively low but no active bleeding. Earlier provider discussed with hematology at [facility] who will follow-up with the patient on discharge.  7. Type 2 Diabetes Mellitus, uncontroled with Hyperglycemia, renal complication last hemoglobin A1c was 6.9 this admission hold her home hypoglycemic agents. On insulin sliding scale sensitive with Basal insulin lantus.   8.  Obesity. Body  mass index is 31.09 kg/m.   Diet: Encourage fluids DVT Prophylaxis: SCD, pharmacological prophylaxis contraindicated due to Thrombocytopenia  Advance goals of care discussion: Full code  Family Communication: no family was present at bedside, at the time of interview.   Disposition:  Discharge to be determined.  Consultants: Hematology Procedures: none  Scheduled Meds: . amLODipine  10 mg Oral q morning - 10a  . [START ON 08/31/2018] cefdinir  300 mg Oral Daily  . furosemide  60 mg Intravenous BID  . guaiFENesin-dextromethorphan  10 mL Oral QID  . insulin aspart  0-15 Units Subcutaneous TID WC  . insulin aspart  0-5 Units Subcutaneous QHS  . insulin aspart  3 Units Subcutaneous TID WC  . insulin glargine  22 Units Subcutaneous QHS  . levalbuterol  0.63 mg Nebulization Q8H  . metoCLOPramide (REGLAN) injection  10 mg Intravenous Q6H  . senna-docusate  1 tablet Oral BID  . Tbo-filgastrim (GRANIX) SQ  480 mcg Subcutaneous ONCE-1800  . vitamin B-12  100 mcg Oral q morning - 10a   Continuous Infusions:  PRN Meds: acetaminophen **OR** acetaminophen, bisacodyl, ondansetron **OR** ondansetron (ZOFRAN) IV Antibiotics: Anti-infectives (From admission, onward)   Start     Dose/Rate Route Frequency Ordered Stop   08/31/18 1000  cefdinir (OMNICEF) capsule 300 mg     300 mg Oral Daily 08/30/18 0951     08/30/18 1000  cefTRIAXone (ROCEPHIN) 2 g in sodium chloride 0.9 % 100 mL IVPB  Status:  Discontinued     2 g 200 mL/hr over 30 Minutes Intravenous Every 24 hours 08/30/18 0925 08/30/18 0946   08/30/18 1000  cefdinir (OMNICEF) capsule 300 mg  Status:  Discontinued     300 mg Oral Every 12 hours 08/30/18 0946 08/30/18 0950   08/29/18 1000  cefdinir (OMNICEF) capsule 300 mg  Status:  Discontinued     300 mg Oral Daily 08/29/18 0749 08/30/18 0925   08/22/2018 1900  ceFEPIme (MAXIPIME) 1 g in sodium chloride 0.9 % 100 mL IVPB  Status:  Discontinued     1 g 200 mL/hr over 30  Minutes Intravenous Every 24 hours 08/01/2018 1825 08/29/18 0749       Objective: Physical Exam: Vitals:   08/30/18 0849 08/30/18 1117 08/30/18 1125 08/30/18 1305  BP: (!) 156/59   (!) 149/48  Pulse: 96   85  Resp:    20  Temp:    98.2 F (36.8 C)  TempSrc:    Oral  SpO2: 90% (!) 87% 93% 91%  Weight:      Height:        Intake/Output Summary (Last 24 hours) at 08/30/2018 1527 Last data filed at 08/30/2018 1309 Gross per 24 hour  Intake 1345 ml  Output 775 ml  Net 570 ml   Filed Weights   08/28/18 0436 08/29/18 0358 08/30/18 0554  Weight: 77.4 kg 80.2 kg 79.6 kg   General: alert and oriented to time, place, and person. Appear in moderate distress, affect appropriate Eyes: PERRL, Conjunctiva normal ENT: Oral Mucosa Clear, dry  Neck: no JVD, no Abnormal Mass Or lumps Cardiovascular: S1 and S2 Present, no Murmur, peripheral pulses symmetrical Respiratory: increased respiratory effort, Bilateral Air entry equal and Decreased, no use of accessory muscle, bilateral Crackles, no wheezes Abdomen: Bowel Sound present, Soft and no tenderness, no hernia Skin: no rashes  Extremities: bilateral Pedal edema, no calf tenderness Neurologic: normal without focal findings, mental status, speech normal, alert and oriented x3, PERLA, Motor strength 5/5 and  symmetric and sensation grossly normal to light touch Gait not checked due to patient safety concerns  Data Reviewed: CBC: Recent Labs  Lab 08/09/2018 1528 08/27/18 0919 08/27/18 1841 08/28/18 0654 08/29/18 0225 08/30/18 0356  WBC 0.7* 0.8*  --  5.6 2.9* 2.2*  NEUTROABS 0.3* 0.2*  --  3.7 1.8 1.0*  HGB 6.6* 8.0* 10.9* 9.7* 8.8* 8.6*  HCT 19.5* 23.1* 31.2* 28.0* 26.0* 25.1*  MCV 90.7 88.2  --  88.9 89.7 90.3  PLT 28* 25*  --  26* 22* 21*   Basic Metabolic Panel: Recent Labs  Lab 08/27/2018 1440 08/27/18 0919 08/28/18 0654 08/29/18 0225 08/30/18 0356  NA 133* 135 135 135 136  K 3.7 3.8 4.6 4.1 4.4  CL 101 104 106 107 107  CO2  19* 19* 18* 19* 20*  GLUCOSE 176* 264* 138* 153* 149*  BUN 28* 25* 26* 23 28*  CREATININE 2.67* 2.37* 2.33* 2.15* 2.39*  CALCIUM 8.3* 7.8* 7.9* 7.9* 8.2*  MG  --   --   --   --  2.2    Liver Function Tests: Recent Labs  Lab 08/28/2018 1440 08/30/18 0356  AST 16 15  ALT 13 10  ALKPHOS 45 37*  BILITOT 0.6 0.5  PROT 7.3 6.0*  ALBUMIN 3.2* 2.5*   Recent Labs  Lab 08/21/2018 1440  LIPASE 30   No results for input(s): AMMONIA in the last 168 hours. Coagulation Profile: No results for input(s): INR, PROTIME in the last 168 hours. Cardiac Enzymes: No results for input(s): CKTOTAL, CKMB, CKMBINDEX, TROPONINI in the last 168 hours. BNP (last 3 results) No results for input(s): PROBNP in the last 8760 hours. CBG: Recent Labs  Lab 08/29/18 1128 08/29/18 1620 08/29/18 2130 08/30/18 0538 08/30/18 1209  GLUCAP 149* 159* 194* 165* 193*   Studies: Dg Chest Port 1 View  Result Date: 08/30/2018 CLINICAL DATA:  Short of breath EXAM: PORTABLE CHEST 1 VIEW COMPARISON:  08/27/2018 FINDINGS: Progression of diffuse bilateral airspace disease upper and lower lobes. No significant effusion. Heart size within normal limits. IMPRESSION: Progression of diffuse bilateral airspace disease. This may represent pneumonia or edema. Electronically Signed   By: Franchot Gallo M.D.   On: 08/30/2018 09:19     Time spent: 35 minutes  Author: Berle Mull, MD [facility] 08/30/2018 3:27 PM  To reach On-call, see care teams to locate the attending and reach out to them via [email or web address]. If 7PM-7AM, please contact night-coverage If you still have difficulty reaching the attending provider, please page the [facility] (Director on Call) for [facility] on amion for assistance.

## 2018-08-30 NOTE — Evaluation (Signed)
Physical Therapy Evaluation Patient Details Name: Ruth Douglas MRN: [id number] DOB: 1940-08-01 Today's Date: 08/30/2018   History of Present Illness  78 year old female with diabetes mellitus on insulin, chronic kidney disease stage III (baseline creatinine 1.2-1.4), left ureteral strictures with recurrent UTIs, hypertension.  Recent hospitalization at [facility] found to have myelodysplastic syndrome.  Seen by hematologist as outpatient.  Admitted withabdominal fullness, dysuria and nausea along with right upper quadrant pain.  found to have bilateral LL PNA and UTI.  Clinical Impression  Patient presents with decreased mobility due to generalized weakness and decreased cardiorespiratory endurance.  Currently minguard to S level A for short distance ambulation on 6L O2 with SpO2 drop to 82% and persistent dyspnea at rest with SpO2 93%.  Patient previously independent with rollator in her home with son support for IADL's.  Feel she will benefit from skilled PT in the acute setting to allow return home with son support and follow up HHPT.     Follow Up Recommendations Home health PT;Supervision/Assistance - 24 hour    Equipment Recommendations  None recommended by PT    Recommendations for Other Services       Precautions / Restrictions Precautions Precautions: Fall Precaution Comments: newly oxygen dependent Restrictions Weight Bearing Restrictions: No      Mobility  Bed Mobility Overal bed mobility: Needs Assistance Bed Mobility: Sit to Supine       Sit to supine: HOB elevated;Supervision   General bed mobility comments: increased time  Transfers Overall transfer level: Needs assistance Equipment used: Rolling walker (2 wheeled) Transfers: Sit to/from Stand Sit to Stand: Min guard         General transfer comment: for balance, O2 line management  Ambulation/Gait Ambulation/Gait assistance: Min guard Gait Distance (Feet): 30 Feet Assistive device: Rolling walker (2  wheeled) Gait Pattern/deviations: Step-to pattern;Step-through pattern;Decreased stride length;Trunk flexed     General Gait Details: assist for safety, O2 line management (extension tubing)  Stairs            Wheelchair Mobility    Modified Rankin (Stroke Patients Only)       Balance Overall balance assessment: Needs assistance Sitting-balance support: Feet supported Sitting balance-Leahy Scale: Good     Standing balance support: Bilateral upper extremity supported Standing balance-Leahy Scale: Poor Standing balance comment: UE support for balance                             Pertinent Vitals/Pain Pain Assessment: No/denies pain    Home Living Family/patient expects to be discharged to:: Private residence Living Arrangements: Children(son) Available Help at Discharge: Family;Available 24 hours/day Type of Home: House Home Access: Stairs to enter Entrance Stairs-Rails: Right Entrance Stairs-Number of Steps: 2 Home Layout: Full bath on main level Home Equipment: Walker - 2 wheels;Shower seat;Wheelchair - manual;Bedside commode      Prior Function Level of Independence: Independent with assistive device(s)         Comments: son drives and cooks, denies falls at home     Hand Dominance        Extremity/Trunk Assessment   Upper Extremity Assessment Upper Extremity Assessment: Generalized weakness    Lower Extremity Assessment Lower Extremity Assessment: LLE deficits/detail;RLE deficits/detail RLE Deficits / Details: AROM WFL, strength hip flexion 3+/5, knee extension 4/5, 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 RLE Sensation: history of peripheral neuropathy LLE Deficits / Details: AROM WFL, strength hip flexion 3+/5, knee extension 4/5, 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  Discussed talking with MD about getting diabetic shoes.  SpO2 at rest on 6L O2 [place] 92-93%; with ambulation on 6L O2 82%; up to 93% with cues for PLB and about 2 min rest    Exercises     Assessment/Plan    PT Assessment Patient needs continued PT services  PT Problem List Decreased strength;Decreased activity tolerance;Decreased balance;Decreased mobility;Cardiopulmonary status limiting activity       PT Treatment Interventions DME instruction;Gait training;Functional mobility training;Therapeutic exercise;Therapeutic activities;Patient/family education    PT Goals (Current goals can be found in the Care Plan section)  Acute Rehab PT Goals Patient Stated Goal: to improve breathing PT Goal Formulation: With patient Time For Goal Achievement: 09/13/18 Potential to Achieve Goals: Fair    Frequency Min 3X/week   Barriers to discharge        Co-evaluation               AM-PAC PT "6 Clicks" Mobility  Outcome Measure Help needed turning from your back to your side while in a flat bed without using bedrails?: None Help needed moving from lying on your back to sitting on the side of a flat bed without using bedrails?: None Help needed moving to and from a bed to a chair (including a wheelchair)?: A Little Help needed standing up from a chair using your arms (e.g., wheelchair or bedside chair)?: A Little Help needed to walk in hospital room?: A Little Help needed climbing 3-5 steps with a railing? : A Little 6 Click Score: 20    End of Session Equipment Utilized During Treatment: Oxygen Activity Tolerance: Patient limited by  fatigue;Treatment limited secondary to medical complications (Comment)(hypoxic on 6L O2) Patient left: with call bell/phone within reach;in bed Nurse Communication: Other (comment)(hypoxia; significant dyspnea at rest) PT Visit Diagnosis: Muscle weakness (generalized) (M62.81);Difficulty in walking, not elsewhere classified (R26.2)    Time: 1884-1660 PT Time Calculation (min) (ACUTE ONLY): 36 min   Charges:   PT Evaluation $PT Eval Moderate Complexity: 1 Mod PT Treatments $Gait Training: 8-22 mins        Magda Kiel, [place] [facility] [phone] 08/30/2018   Reginia Naas 08/30/2018, 12:10 PM

## 2018-08-30 NOTE — Progress Notes (Signed)
RT tried to wean patient off BiPAP to 8L [place] sats stayed between 86-88 % RR 29. Patient placed back on BiPAP vitals stable.

## 2018-08-30 NOTE — Progress Notes (Signed)
While in room s/p ABG, noted sats 86% on 6 lpm [place].  Pt placed on NRB, sat improved to 90%- RN in room and aware.  PER RN, pt has a room on 6e & plans for transfer soon for bipap.

## 2018-08-30 NOTE — Progress Notes (Signed)
PHARMACY NOTE:  ANTIMICROBIAL RENAL DOSAGE ADJUSTMENT  Current antimicrobial regimen includes a mismatch between antimicrobial dosage and estimated renal function.  As per policy approved by the Pharmacy & Therapeutics and Medical Executive Committees, the antimicrobial dosage will be adjusted accordingly.  Current antimicrobial dosage:  Cefdinir 300 mg twice daily  Indication: UTI  Renal Function:  Estimated Creatinine Clearance: 20.3 mL/min (A) (by C-G formula based on SCr of 2.39 mg/dL (H)). []      On intermittent HD, scheduled: []      On CRRT    Antimicrobial dosage has been changed to:  Cefdinir 300 mg daily as appropriate given CrCl<30 mL/min  Additional comments:   Thank you for allowing pharmacy to be a part of this patient's care.  Antonietta Jewel, PharmD, [place] Clinical Pharmacist  Pager: [phone] Phone: [phone] 08/30/2018 9:49 AM

## 2018-08-30 NOTE — Progress Notes (Signed)
Pt now on 6e. Pt placed on bipap and appears to be tol well.  Pt states she feels she is getting enough "breath".  No distress noted, VSS currently.

## 2018-08-30 NOTE — Progress Notes (Addendum)
Patient's breathing is more labored again.   Respiratory therapist in room.   Changed patient over to Non-Rebreather at 15L. Patient to be transferred to 6E-26 for Bi-pap

## 2018-08-30 NOTE — Progress Notes (Signed)
Called Nurse for [place] to [place].  Gave report

## 2018-08-30 NOTE — Progress Notes (Signed)
RN and I discussed sat of 87% on 6 lpm [place].  Per RN, pt just received lasix recently.  We discussed pt possibly needing higher fio2 until lasix can start working.  Went to obtain NRB, came back to room and pt sat is now 93% on [place].  No distress noted, no increased WOB noted, pt is awake and answerers questions.  NRB is in room in the event pt needs it.  This was discussed w/ RN.

## 2018-08-30 NOTE — Progress Notes (Signed)
Recheck patient- seems improved a little.  More comfortable in bed (not as labor breathing).   Patient had a breathing treatment and a dose of lasix early.  Breath sounds still diminished but not as congested/crackles.

## 2018-08-30 NOTE — Progress Notes (Signed)
Called and spoke to Son Wells Guiles).  Gave him an update regarding patient's situation.   Let him know she had been transferred to [place].

## 2018-08-30 NOTE — Progress Notes (Signed)
MD on the Floor-  Spoke to MD concern because patient was having a lot of SOB.   MD is aware- order a Chest xray.

## 2018-08-31 ENCOUNTER — Inpatient Hospital Stay (HOSPITAL_COMMUNITY): Payer: Medicare HMO

## 2018-08-31 DIAGNOSIS — J9601 Acute respiratory failure with hypoxia: Secondary | ICD-10-CM

## 2018-08-31 LAB — COMPREHENSIVE METABOLIC PANEL
ALT: 11 U/L (ref 0–44)
AST: 21 U/L (ref 15–41)
Albumin: 2.7 g/dL — ABNORMAL LOW (ref 3.5–5.0)
Alkaline Phosphatase: 45 U/L (ref 38–126)
Anion gap: 12 (ref 5–15)
BUN: 33 mg/dL — ABNORMAL HIGH (ref 8–23)
CO2: 20 mmol/L — ABNORMAL LOW (ref 22–32)
Calcium: 8.4 mg/dL — ABNORMAL LOW (ref 8.9–10.3)
Chloride: 103 mmol/L (ref 98–111)
Creatinine, Ser: 2.47 mg/dL — ABNORMAL HIGH (ref 0.44–1.00)
GFR calc Af Amer: 21 mL/min — ABNORMAL LOW (ref >60)
GFR calc non Af Amer: 18 mL/min — ABNORMAL LOW (ref >60)
Glucose, Bld: 60 mg/dL — ABNORMAL LOW (ref 70–99)
Potassium: 3.5 mmol/L (ref 3.5–5.1)
Sodium: 135 mmol/L (ref 135–145)
Total Bilirubin: 1.1 mg/dL (ref 0.3–1.2)
Total Protein: 6.5 g/dL (ref 6.5–8.1)

## 2018-08-31 LAB — PROCALCITONIN: Procalcitonin: 2.02 ng/mL

## 2018-08-31 LAB — CULTURE, BLOOD (ROUTINE X 2)
Culture: NO GROWTH
Culture: NO GROWTH
Special Requests: ADEQUATE
Special Requests: ADEQUATE

## 2018-08-31 LAB — OSMOLALITY, URINE: Osmolality, Ur: 323 mOsm/kg (ref 300–900)

## 2018-08-31 LAB — D-DIMER, QUANTITATIVE: D-Dimer, Quant: 3.46 ug/mL-FEU — ABNORMAL HIGH (ref 0.00–0.50)

## 2018-08-31 LAB — CBC
HCT: 24 % — ABNORMAL LOW (ref 36.0–46.0)
Hemoglobin: 8.2 g/dL — ABNORMAL LOW (ref 12.0–15.0)
MCH: 30.6 pg (ref 26.0–34.0)
MCHC: 34.2 g/dL (ref 30.0–36.0)
MCV: 89.6 fL (ref 80.0–100.0)
Platelets: 20 10*3/uL — CL (ref 150–400)
RBC: 2.68 MIL/uL — ABNORMAL LOW (ref 3.87–5.11)
RDW: 14.6 % (ref 11.5–15.5)
WBC: 3.5 10*3/uL — ABNORMAL LOW (ref 4.0–10.5)
nRBC: 1.7 % — ABNORMAL HIGH (ref 0.0–0.2)

## 2018-08-31 LAB — CREATININE, URINE, RANDOM: Creatinine, Urine: 44.23 mg/dL

## 2018-08-31 LAB — GLUCOSE, CAPILLARY
Glucose-Capillary: 58 mg/dL — ABNORMAL LOW (ref 70–99)
Glucose-Capillary: 79 mg/dL (ref 70–99)
Glucose-Capillary: 150 mg/dL — ABNORMAL HIGH (ref 70–99)
Glucose-Capillary: 135 mg/dL — ABNORMAL HIGH (ref 70–99)
Glucose-Capillary: 218 mg/dL — ABNORMAL HIGH (ref 70–99)

## 2018-08-31 LAB — NA AND K (SODIUM & POTASSIUM), RAND UR
Potassium Urine: 23 mmol/L
Sodium, Ur: 82 mmol/L

## 2018-08-31 LAB — OSMOLALITY: Osmolality: 291 mOsm/kg (ref 275–295)

## 2018-08-31 LAB — MAGNESIUM: Magnesium: 1.8 mg/dL (ref 1.7–2.4)

## 2018-08-31 MED ORDER — LEVALBUTEROL HCL 0.63 MG/3ML IN NEBU
0.6300 mg | INHALATION_SOLUTION | Freq: Four times a day (QID) | RESPIRATORY_TRACT | Status: DC
  Administered 2018-08-31 – 2018-09-01 (×5): 0.63 mg via RESPIRATORY_TRACT
  Filled 2018-08-31 (×5): qty 3

## 2018-08-31 MED ORDER — HYDRALAZINE HCL 25 MG PO TABS
25.0000 mg | ORAL_TABLET | Freq: Three times a day (TID) | ORAL | Status: DC
  Administered 2018-08-31 – 2018-09-03 (×8): 25 mg via ORAL
  Filled 2018-08-31 (×9): qty 1

## 2018-08-31 MED ORDER — GUAIFENESIN-DM 100-10 MG/5ML PO SYRP
15.0000 mL | ORAL_SOLUTION | Freq: Four times a day (QID) | ORAL | Status: DC
  Administered 2018-08-31 – 2018-09-02 (×8): 15 mL via ORAL
  Filled 2018-08-31 (×9): qty 15

## 2018-08-31 MED ORDER — VANCOMYCIN HCL IN DEXTROSE 750-5 MG/150ML-% IV SOLN
750.0000 mg | INTRAVENOUS | Status: DC
  Filled 2018-08-31: qty 150

## 2018-08-31 MED ORDER — TECHNETIUM TO 99M ALBUMIN AGGREGATED
1.5000 | Freq: Once | INTRAVENOUS | Status: AC | PRN
  Administered 2018-08-31: 1.5 via INTRAVENOUS

## 2018-08-31 MED ORDER — ALBUMIN HUMAN 25 % IV SOLN
12.5000 g | Freq: Once | INTRAVENOUS | Status: AC
  Administered 2018-08-31: 12.5 g via INTRAVENOUS
  Filled 2018-08-31: qty 50

## 2018-08-31 MED ORDER — BENZONATATE 100 MG PO CAPS
100.0000 mg | ORAL_CAPSULE | Freq: Three times a day (TID) | ORAL | Status: DC
  Administered 2018-08-31 – 2018-09-02 (×7): 100 mg via ORAL
  Filled 2018-08-31 (×7): qty 1

## 2018-08-31 MED ORDER — VANCOMYCIN HCL 10 G IV SOLR
1500.0000 mg | Freq: Once | INTRAVENOUS | Status: AC
  Administered 2018-08-31: 1500 mg via INTRAVENOUS
  Filled 2018-08-31: qty 1500

## 2018-08-31 MED ORDER — FUROSEMIDE 10 MG/ML IJ SOLN: 60.0000 mg | Freq: Every day | INTRAMUSCULAR | Status: DC

## 2018-08-31 MED ORDER — SODIUM CHLORIDE 0.9 % IV SOLN
2.0000 g | INTRAVENOUS | Status: DC
  Administered 2018-08-31 – 2018-09-04 (×5): 2 g via INTRAVENOUS
  Filled 2018-08-31 (×6): qty 2

## 2018-08-31 MED ORDER — METHYLPREDNISOLONE SODIUM SUCC 40 MG IJ SOLR
40.0000 mg | Freq: Every day | INTRAMUSCULAR | Status: DC
  Administered 2018-08-31 – 2018-09-02 (×3): 40 mg via INTRAVENOUS
  Filled 2018-08-31 (×3): qty 1

## 2018-08-31 NOTE — Progress Notes (Signed)
Echo attempted x2. Patient was vomitting. Will go try again later as schedule permits.

## 2018-08-31 NOTE — Progress Notes (Signed)
Pharmacy Antibiotic Note  Ruth Douglas is a 78 y.o. female admitted on 07/30/2018 with urinary frequency and nausea.  Patient was on cefepime and then narrowed to Cefdinir.  Now with respiratory distress and Pharmacy has been consulted for vancomycin and cefepime dosing for PNA.  SCr 2.47, CrCL 20 ml/min, afebrile, WBC 3.5.  Plan: Vanc 1500mg  IV x 1, then 750mg  IV Q48H for AUC 517 using SCr 2.47 Cefepime 2gm IV Q24H Monitor renal fxn, clinical progress, vanc AUC  Height: 5\' 3"  (160 cm) Weight: 177 lb 11.1 oz (80.6 kg) IBW/kg (Calculated) : 52.4  Temp (24hrs), Avg:98.3 F (36.8 C), Min:97.7 F (36.5 C), Max:99.2 F (37.3 C)  Recent Labs  Lab 08/21/2018 2047 08/27/18 0027 08/27/18 0919 08/28/18 0654 08/29/18 0225 08/30/18 0356 08/31/18 0645  WBC  --   --  0.8* 5.6 2.9* 2.2* 3.5*  CREATININE  --   --  2.37* 2.33* 2.15* 2.39* 2.47*  LATICACIDVEN 1.1 1.1  --   --   --  1.1  --     Estimated Creatinine Clearance: 19.8 mL/min (A) (by C-G formula based on SCr of 2.47 mg/dL (H)).    Allergies  Allergen Reactions  . Phenergan [Promethazine Hcl] Other (See Comments)    "causes me to be out of my head"     Cefepime 6/28>>7/1, restart 7/3 >> Cefdinir 7/1 >> 7/3 Vanc 7/3 >>  6/28 BCx - NGTD 6/28 COVID: neg 6/29 Ucx: <10 k insignificant growth  Aleesha Ringstad D. Mina Marble, PharmD, BCPS, [place] 08/31/2018, 4:51 PM

## 2018-08-31 NOTE — Progress Notes (Signed)
At about 0425 patient requested to come off BIPAP.  RN removed BIPAP and placed patient on high flow nasal cannula at 8L, oxygen saturation maintaining greater than 92% at this time.

## 2018-08-31 NOTE — Plan of Care (Signed)
  Problem: Clinical Measurements: Goal: Respiratory complications will improve Outcome: Progressing   Problem: Coping: Goal: Level of anxiety will decrease Outcome: Progressing   Problem: Pain Managment: Goal: General experience of comfort will improve Outcome: Progressing   Problem: Safety: Goal: Ability to remain free from injury will improve Outcome: Progressing   Problem: Skin Integrity: Goal: Risk for impaired skin integrity will decrease Outcome: Progressing   

## 2018-08-31 NOTE — Progress Notes (Signed)
Bilateral lower extremity venous duplex completed. Refer to "CV Proc" under chart review to view preliminary results.  08/31/2018 9:09 AM Maudry Mayhew, MHA, RVT, RDCS, RDMS

## 2018-08-31 NOTE — Progress Notes (Signed)
[facility] Progress Note  Patient: Ruth Douglas MAU:[id number]   PCP: Majel Homer, PA DOB: 13-Apr-1940   DOA: 08/03/2018   DOS: 08/31/2018   Date of Service: the patient was seen and examined on 08/31/2018  Brief hospital course: Pt. with PMH of type II DM, CKD 3, recurrent UTI, HTN, myelodysplastic syndrome; admitted on 08/12/2018, presented with complaint of abdominal fullness, dysuria, nausea, right upper quadrant pain, was found to have AKI, symptomatic anemia, bilateral lower lobe pneumonia, pyelonephritis.  SARS-CoV-2 negative.  Patient is now getting more hypoxic secondary to volume overload and pneumonia. Currently further plan is continue IV biotics.  Subjective: Patient is feeling better.  Still appears significantly short of breath respiratory rate 20-30.  Minimal urine output yesterday.  No nausea at the time of my evaluation but was vomiting early in the morning.  No diarrhea.  No constipation.  Assessment and Plan: 1. AKI on chronic kidney disease stage III Baseline serum creatinine 1.5-1.75 On presentation 2.67 with elevated BUN and hyponatremia. Currently creatinine around 2, normal sodium normal potassium. Still not back to baseline.   Strict in and out.  Urine output is significantly less.  1500cc in the last 24 hours. If no improvement will consult nephrology tomorrow.  2. UTI recurrent, sepsis ruled out UA nitrite negative Urine culture insignificant growth. Recent urine culture on 08/17/2018 at [facility] grew significant E. coli, resistant to ampicillin and Augmentin. CT renal shows mild left hydronephrosis without stone, diffuse bladder wall thickening and bilateral nonobstructive renal stone. Patient has known history of urological issues on the left ureter with recurrent UTI and follows up with urology. Patient's pain is primarily on the right flank. Blood cultures negative for 2 days.  Initially being treated with IV cefepime. Based on the prior culture  transition to stop Omnicef.  Given her worsening respiratory distress we will transition her back to IV antibiotics.  Requires a urology follow-up.  Has seen urology at [facility].  3. Bilateral lower lobe pneumonia.,  Sepsis ruled out Likely in the setting of aspiration from nausea and vomiting Acute hypoxic respiratory failure CT on chest x-ray shows evidence of bilateral lower lobe pneumonia. Currently on 6 L of oxygen does not use any oxygen at home. Blood cultures as above are negative. Incentive spirometry. Wean oxygen to room air. Continue stepdown unit for close monitoring and BiPAP support as needed. VQ scan is negative for any PE. Suspect that this is all pneumonia.  Will add steroids for refractory pneumonia  4. Recurrent nausea and vomiting. Currently resolved. Likely in the setting of infection. Tolerating full liquid diet.  Advance to soft diet. CT abdomen scan negative for any acute abnormality. Due to persistent nausea and vomiting repeat CT scan was performed, negative.  Scheduled Reglan ordered.  5.  Acute on chronic diastolic CHF. Essential hypertension Volume overload from aggressive IV resuscitation. Follow-up on echocardiogram Continue to monitor on telemetry. IV Lasix 60 mg twice daily.  Will provide albumin support. No improvement will consult nephrology tomorrow. May require cardiology input depending on the echocardiogram. Reduce Norvasc from 10 mg to 5 mg.  6.  Myelodysplastic syndrome. Pancytopenia. Symptomatic anemia. Received 2 PRBC. H&H relatively stable. No active bleeding reported. Received Granix after discussion with hematology. Neutropenia improving. Platelets are relatively low but no active bleeding. Earlier provider discussed with hematology at [facility] who will follow-up with the patient on discharge.  7. Type 2 Diabetes Mellitus, uncontroled with Hyperglycemia, renal complication last hemoglobin A1c was 6.9 this admission hold her  home hypoglycemic agents. On insulin sliding scale sensitive with Basal insulin lantus.  8.  Obesity. Body mass index is 31.48 kg/m.   Diet: Encourage fluids DVT Prophylaxis: SCD, pharmacological prophylaxis contraindicated due to Thrombocytopenia  Advance goals of care discussion: Full code  Family Communication: no family was present at bedside, at the time of interview.   Disposition:  Discharge to be determined.  Consultants: Hematology Procedures: none  Scheduled Meds: . benzonatate  100 mg Oral TID  . guaiFENesin-dextromethorphan  15 mL Oral QID  . hydrALAZINE  25 mg Oral Q8H  . insulin aspart  0-15 Units Subcutaneous TID WC  . insulin aspart  0-5 Units Subcutaneous QHS  . insulin aspart  3 Units Subcutaneous TID WC  . insulin glargine  22 Units Subcutaneous QHS  . levalbuterol  0.63 mg Nebulization QID  . methylPREDNISolone (SOLU-MEDROL) injection  40 mg Intravenous Daily  . metoCLOPramide (REGLAN) injection  10 mg Intravenous Q6H  . senna-docusate  1 tablet Oral BID  . vitamin B-12  100 mcg Oral q morning - 10a   Continuous Infusions: . ceFEPime (MAXIPIME) IV 2 g (08/31/18 1807)  . vancomycin 1,500 mg (08/31/18 1808)  . [START ON 09/02/2018] vancomycin     PRN Meds: acetaminophen **OR** acetaminophen, bisacodyl, ondansetron **OR** ondansetron (ZOFRAN) IV Antibiotics: Anti-infectives (From admission, onward)   Start     Dose/Rate Route Frequency Ordered Stop   09/02/18 1800  vancomycin (VANCOCIN) IVPB 750 mg/150 ml premix     750 mg 150 mL/hr over 60 Minutes Intravenous Every 48 hours 08/31/18 1653     08/31/18 1700  vancomycin (VANCOCIN) 1,500 mg in sodium chloride 0.9 % 500 mL IVPB     1,500 mg 250 mL/hr over 120 Minutes Intravenous  Once 08/31/18 1653     08/31/18 1700  ceFEPIme (MAXIPIME) 2 g in sodium chloride 0.9 % 100 mL IVPB     2 g 200 mL/hr over 30 Minutes Intravenous Every 24 hours 08/31/18 1653     08/31/18 1000  cefdinir (OMNICEF) capsule 300 mg   Status:  Discontinued     300 mg Oral Daily 08/30/18 0951 08/31/18 1636   08/30/18 1000  cefTRIAXone (ROCEPHIN) 2 g in sodium chloride 0.9 % 100 mL IVPB  Status:  Discontinued     2 g 200 mL/hr over 30 Minutes Intravenous Every 24 hours 08/30/18 0925 08/30/18 0946   08/30/18 1000  cefdinir (OMNICEF) capsule 300 mg  Status:  Discontinued     300 mg Oral Every 12 hours 08/30/18 0946 08/30/18 0950   08/29/18 1000  cefdinir (OMNICEF) capsule 300 mg  Status:  Discontinued     300 mg Oral Daily 08/29/18 0749 08/30/18 0925   08/18/2018 1900  ceFEPIme (MAXIPIME) 1 g in sodium chloride 0.9 % 100 mL IVPB  Status:  Discontinued     1 g 200 mL/hr over 30 Minutes Intravenous Every 24 hours 08/16/2018 1825 08/29/18 0749       Objective: Physical Exam: Vitals:   08/31/18 1121 08/31/18 1242 08/31/18 1528 08/31/18 1621  BP:  (!) 152/67  (!) 138/57  Pulse:  97  92  Resp:  (!) 25  19  Temp:    97.8 F (36.6 C)  TempSrc:  Oral  Oral  SpO2: 96% 91% 95% 97%  Weight:      Height:        Intake/Output Summary (Last 24 hours) at 08/31/2018 1946 Last data filed at 08/31/2018 1636 Gross per 24 hour  Intake  120 ml  Output 1550 ml  Net -1430 ml   Filed Weights   08/30/18 0554 08/30/18 1720 08/31/18 0506  Weight: 79.6 kg 79.4 kg 80.6 kg   General: alert and oriented to time, place, and person. Appear in moderate distress, affect appropriate Eyes: PERRL, Conjunctiva normal ENT: Oral Mucosa Clear, dry  Neck: no JVD, no Abnormal Mass Or lumps Cardiovascular: S1 and S2 Present, no Murmur, peripheral pulses symmetrical Respiratory: increased respiratory effort, Bilateral Air entry equal and Decreased, no use of accessory muscle, bilateral Crackles, no wheezes Abdomen: Bowel Sound present, Soft and no tenderness, no hernia Skin: no rashes  Extremities: bilateral Pedal edema, no calf tenderness Neurologic: normal without focal findings, mental status, speech normal, alert and oriented x3, PERLA, Motor strength  5/5 and symmetric and sensation grossly normal to light touch Gait not checked due to patient safety concerns  Data Reviewed: CBC: Recent Labs  Lab 07/31/2018 1528 08/27/18 0919 08/27/18 1841 08/28/18 0654 08/29/18 0225 08/30/18 0356 08/31/18 0645  WBC 0.7* 0.8*  --  5.6 2.9* 2.2* 3.5*  NEUTROABS 0.3* 0.2*  --  3.7 1.8 1.0*  --   HGB 6.6* 8.0* 10.9* 9.7* 8.8* 8.6* 8.2*  HCT 19.5* 23.1* 31.2* 28.0* 26.0* 25.1* 24.0*  MCV 90.7 88.2  --  88.9 89.7 90.3 89.6  PLT 28* 25*  --  26* 22* 21* 20*   Basic Metabolic Panel: Recent Labs  Lab 08/27/18 0919 08/28/18 0654 08/29/18 0225 08/30/18 0356 08/31/18 0645  NA 135 135 135 136 135  K 3.8 4.6 4.1 4.4 3.5  CL 104 106 107 107 103  CO2 19* 18* 19* 20* 20*  GLUCOSE 264* 138* 153* 149* 60*  BUN 25* 26* 23 28* 33*  CREATININE 2.37* 2.33* 2.15* 2.39* 2.47*  CALCIUM 7.8* 7.9* 7.9* 8.2* 8.4*  MG  --   --   --  2.2 1.8    Liver Function Tests: Recent Labs  Lab 08/25/2018 1440 08/30/18 0356 08/31/18 0645  AST 16 15 21   ALT 13 10 11   ALKPHOS 45 37* 45  BILITOT 0.6 0.5 1.1  PROT 7.3 6.0* 6.5  ALBUMIN 3.2* 2.5* 2.7*   Recent Labs  Lab 07/30/2018 1440  LIPASE 30   No results for input(s): AMMONIA in the last 168 hours. Coagulation Profile: No results for input(s): INR, PROTIME in the last 168 hours. Cardiac Enzymes: No results for input(s): CKTOTAL, CKMB, CKMBINDEX, TROPONINI in the last 168 hours. BNP (last 3 results) No results for input(s): PROBNP in the last 8760 hours. CBG: Recent Labs  Lab 08/30/18 2013 08/31/18 0810 08/31/18 0835 08/31/18 0940 08/31/18 1551  GLUCAP 152* 58* 79 150* 135*   Studies: Dg Chest 2 View  Result Date: 08/31/2018 CLINICAL DATA:  Shortness of breath EXAM: CHEST - 2 VIEW COMPARISON:  08/30/2018 FINDINGS: Worsening asymmetric upper lobe patchy airspace process concerning for bilateral pneumonia. Minor improvement in the lower lobe aeration. Heart is enlarged. No definite CHF pattern. No  pneumothorax. Small posterior layering effusions on lateral view. Trachea midline. Aorta atherosclerotic and degenerative changes of the spine. IMPRESSION: Worsening bilateral upper lobe airspace process compatible with bilateral pneumonia. Electronically Signed   By: Jerilynn Mages.  Shick M.D.   On: 08/31/2018 14:15   Nm Pulmonary Perfusion  Result Date: 08/31/2018 CLINICAL DATA:  Shortness of breath, pneumonia, positive D-dimer EXAM: NUCLEAR MEDICINE PERFUSION LUNG SCAN TECHNIQUE: Perfusion images were obtained in multiple projections after intravenous injection of radiopharmaceutical. Ventilation scans intentionally deferred if perfusion scan and chest x-ray  adequate for interpretation during COVID 19 epidemic. RADIOPHARMACEUTICALS:  1.6 mCi Tc-52m MAA IV COMPARISON:  08/31/2018 chest x-ray FINDINGS: Symmetric lung perfusion without peripheral wedge-shaped segmental perfusion defects. IMPRESSION: Normal perfusion lung scan. Electronically Signed   By: Jerilynn Mages.  Shick M.D.   On: 08/31/2018 14:26   Vas [place] Lower Extremity Venous (dvt)  Result Date: 08/31/2018  Lower Venous Study Indications: Hypoxia.  Limitations: Body habitus and patient in upright position due to shortness of breath/hypoxia. Patient unable to tolerate all compression maneuvers. Comparison Study: No prior study. Performing Technologist: Maudry Mayhew MHA, RDMS, RVT, RDCS  Examination Guidelines: A complete evaluation includes B-mode imaging, spectral Doppler, color Doppler, and power Doppler as needed of all accessible portions of each vessel. Bilateral testing is considered an integral part of a complete examination. Limited examinations for reoccurring indications may be performed as noted.  +---------+---------------+---------+-----------+----------+------------------+ RIGHT    CompressibilityPhasicitySpontaneityPropertiesSummary            +---------+---------------+---------+-----------+----------+------------------+ CFV      Full            Yes      Yes                                     +---------+---------------+---------+-----------+----------+------------------+ SFJ      Full                                                            +---------+---------------+---------+-----------+----------+------------------+ FV Prox  Full                                                            +---------+---------------+---------+-----------+----------+------------------+ FV Mid                           Yes                  Patent by color                                                          Doppler            +---------+---------------+---------+-----------+----------+------------------+ FV Distal                        Yes                  Patent by color                                                          Doppler            +---------+---------------+---------+-----------+----------+------------------+ PFV      Full                                                            +---------+---------------+---------+-----------+----------+------------------+  POP      Full           Yes      Yes                                     +---------+---------------+---------+-----------+----------+------------------+ PTV                              Yes                  Patent by color                                                          Doppler            +---------+---------------+---------+-----------+----------+------------------+ PERO                             Yes                  Patent by color                                                          Doppler            +---------+---------------+---------+-----------+----------+------------------+   +---------+---------------+---------+-----------+----------+------------------+ LEFT     CompressibilityPhasicitySpontaneityPropertiesSummary             +---------+---------------+---------+-----------+----------+------------------+ CFV                     Yes      Yes                  Patent by color                                                          Doppler            +---------+---------------+---------+-----------+----------+------------------+ SFJ                                                   Not visualized     +---------+---------------+---------+-----------+----------+------------------+ FV Prox                          Yes                  Patent by color  Doppler            +---------+---------------+---------+-----------+----------+------------------+ FV Mid                           Yes                  Patent by color                                                          Doppler            +---------+---------------+---------+-----------+----------+------------------+ FV Distal                        Yes                  Patent by color                                                          Doppler            +---------+---------------+---------+-----------+----------+------------------+ POP                     Yes      Yes                  Patent by color                                                          Doppler            +---------+---------------+---------+-----------+----------+------------------+ PTV                              Yes                  Patent by color                                                          Doppler            +---------+---------------+---------+-----------+----------+------------------+ PERO                             Yes                  Patent by color                                                          Doppler            +---------+---------------+---------+-----------+----------+------------------+  Summary: Right: There is  no evidence of deep vein thrombosis in the lower extremity. However, portions of this examination were limited- see technologist comments above. No cystic structure found in the popliteal fossa. Left: There is no evidence of deep vein thrombosis in the lower extremity. However, portions of this examination were limited- see technologist comments above. No cystic structure found in the popliteal fossa.  *See table(s) above for measurements and observations. Electronically signed by Servando Snare MD on 08/31/2018 at 11:00:19 AM.    Final      Time spent: 35 minutes  Author: Berle Mull, MD [facility] 08/31/2018 7:46 PM  To reach On-call, see care teams to locate the attending and reach out to them via [email or web address]. If 7PM-7AM, please contact night-coverage If you still have difficulty reaching the attending provider, please page the [facility] (Director on Call) for [facility] on amion for assistance.

## 2018-08-31 NOTE — Progress Notes (Signed)
RN called earlier d/t pt mouth breathing and desat on 8 lpm HFNC.  Flow was increased to 15 lpm HFNC, sat now 93-94% on HFNC.  Pt denies SOB currently, VSS.

## 2018-09-01 ENCOUNTER — Inpatient Hospital Stay (HOSPITAL_COMMUNITY): Payer: Medicare HMO

## 2018-09-01 DIAGNOSIS — I361 Nonrheumatic tricuspid (valve) insufficiency: Secondary | ICD-10-CM

## 2018-09-01 DIAGNOSIS — I340 Nonrheumatic mitral (valve) insufficiency: Secondary | ICD-10-CM

## 2018-09-01 LAB — COMPREHENSIVE METABOLIC PANEL
ALT: 13 U/L (ref 0–44)
AST: 24 U/L (ref 15–41)
Albumin: 2.5 g/dL — ABNORMAL LOW (ref 3.5–5.0)
Alkaline Phosphatase: 43 U/L (ref 38–126)
Anion gap: 13 (ref 5–15)
BUN: 40 mg/dL — ABNORMAL HIGH (ref 8–23)
CO2: 20 mmol/L — ABNORMAL LOW (ref 22–32)
Calcium: 8.4 mg/dL — ABNORMAL LOW (ref 8.9–10.3)
Chloride: 101 mmol/L (ref 98–111)
Creatinine, Ser: 2.37 mg/dL — ABNORMAL HIGH (ref 0.44–1.00)
GFR calc Af Amer: 22 mL/min — ABNORMAL LOW (ref >60)
GFR calc non Af Amer: 19 mL/min — ABNORMAL LOW (ref >60)
Glucose, Bld: 151 mg/dL — ABNORMAL HIGH (ref 70–99)
Potassium: 4 mmol/L (ref 3.5–5.1)
Sodium: 134 mmol/L — ABNORMAL LOW (ref 135–145)
Total Bilirubin: 0.7 mg/dL (ref 0.3–1.2)
Total Protein: 6.6 g/dL (ref 6.5–8.1)

## 2018-09-01 LAB — CBC
HCT: 23.5 % — ABNORMAL LOW (ref 36.0–46.0)
Hemoglobin: 8.2 g/dL — ABNORMAL LOW (ref 12.0–15.0)
MCH: 31.3 pg (ref 26.0–34.0)
MCHC: 34.9 g/dL (ref 30.0–36.0)
MCV: 89.7 fL (ref 80.0–100.0)
Platelets: 16 10*3/uL — CL (ref 150–400)
RBC: 2.62 MIL/uL — ABNORMAL LOW (ref 3.87–5.11)
RDW: 14.6 % (ref 11.5–15.5)
WBC: 2.5 10*3/uL — ABNORMAL LOW (ref 4.0–10.5)
nRBC: 1.6 % — ABNORMAL HIGH (ref 0.0–0.2)

## 2018-09-01 LAB — GLUCOSE, CAPILLARY
Glucose-Capillary: 107 mg/dL — ABNORMAL HIGH (ref 70–99)
Glucose-Capillary: 140 mg/dL — ABNORMAL HIGH (ref 70–99)
Glucose-Capillary: 230 mg/dL — ABNORMAL HIGH (ref 70–99)
Glucose-Capillary: 203 mg/dL — ABNORMAL HIGH (ref 70–99)

## 2018-09-01 LAB — MAGNESIUM: Magnesium: 1.9 mg/dL (ref 1.7–2.4)

## 2018-09-01 LAB — ECHOCARDIOGRAM COMPLETE
Height: 63 in
Weight: 2853.63 oz

## 2018-09-01 LAB — PROCALCITONIN: Procalcitonin: 1.96 ng/mL

## 2018-09-01 MED ORDER — LEVALBUTEROL HCL 0.63 MG/3ML IN NEBU
0.6300 mg | INHALATION_SOLUTION | Freq: Three times a day (TID) | RESPIRATORY_TRACT | Status: DC
  Administered 2018-09-01 – 2018-09-03 (×7): 0.63 mg via RESPIRATORY_TRACT
  Filled 2018-09-01 (×7): qty 3

## 2018-09-01 NOTE — Progress Notes (Signed)
2D Echocardiogram has been performed.  Ruth Douglas 09/01/2018, 8:47 AM

## 2018-09-01 NOTE — Progress Notes (Signed)
[facility] Progress Note  Patient: Ruth Douglas WNU:[id number]   PCP: Majel Homer, PA DOB: [birth year]   DOA: 08/02/2018   DOS: 09/01/2018   Date of Service: the patient was seen and examined on 09/01/2018  Brief hospital course: Pt. with PMH of type II DM, CKD 3, recurrent UTI, HTN, myelodysplastic syndrome; admitted on 08/27/2018, presented with complaint of abdominal fullness, dysuria, nausea, right upper quadrant pain, was found to have AKI, symptomatic anemia, bilateral lower lobe pneumonia, pyelonephritis.  SARS-CoV-2 negative.  Patient is now getting more hypoxic secondary to volume overload and pneumonia. Currently further plan is continue IV biotics.  Subjective: Patient is feeling better.  Still on 12 L of oxygen.  Dropped down to 80s on 6 L.  No nausea no vomiting no chest pain.  Assessment and Plan: 1. AKI on chronic kidney disease stage III Baseline serum creatinine 1.5-1.75 On presentation 2.67 with elevated BUN and hyponatremia. Currently creatinine around 2, normal sodium normal potassium. Still not back to baseline.   Strict in and out.  Urine output is significantly less.  1000 cc in the last 24 hours.  2. UTI recurrent,  UA nitrite negative Urine culture insignificant growth. Recent urine culture on 08/17/2018 at [facility] grew significant E. coli, resistant to ampicillin and Augmentin. CT renal shows mild left hydronephrosis without stone, diffuse bladder wall thickening and bilateral nonobstructive renal stone. Patient has known history of urological issues on the left ureter with recurrent UTI and follows up with urology. Patient's pain is primarily on the right flank. Blood cultures negative   Initially being treated with IV cefepime. Based on the prior culture transition to [facility].  Given her worsening respiratory distress we will transition her back to IV antibiotics.  Requires a urology follow-up.  Has seen urology at [facility].  3. Bilateral lower lobe  pneumonia. Likely in the setting of aspiration from nausea and vomiting Acute hypoxic respiratory failure CT on chest x-ray shows evidence of bilateral lower lobe pneumonia. Currently on 12 L of oxygen does not use any oxygen at home. Blood cultures as above are negative. Incentive spirometry. Wean oxygen to room air. Continue stepdown unit for close monitoring and BiPAP support as needed. VQ scan is negative for any PE. Suspect that this is all pneumonia.  Will add steroids for refractory pneumonia Continue pulmonary toilet flutter device and incentive spirometry.  4. Recurrent nausea and vomiting. Currently resolved. Likely in the setting of infection. Tolerating full liquid diet.  Advance to soft diet. CT abdomen scan negative for any acute abnormality. Due to persistent nausea and vomiting repeat CT scan was performed, negative.  Scheduled Reglan ordered.  5.  Acute on chronic diastolic CHF. Essential hypertension Volume overload from aggressive IV resuscitation. Echocardiogram shows preserved EF.  No wall motion or valvular abnormality. Reduce Norvasc from 10 mg to 5 mg.  6.  Myelodysplastic syndrome. Pancytopenia. Symptomatic anemia. Received 2 PRBC. H&H relatively stable. No active bleeding reported. Received Granix after discussion with hematology. Neutropenia improving. Platelets are relatively low but no active bleeding. Earlier provider discussed with hematology at [facility] who will follow-up with the patient on discharge.  7. Type 2 Diabetes Mellitus, uncontroled with Hyperglycemia, renal complication last hemoglobin A1c was 6.9 this admission hold her home hypoglycemic agents. On insulin sliding scale sensitive with Basal insulin lantus.  8.  Obesity. Body mass index is 31.59 kg/m.   Diet: Encourage fluids DVT Prophylaxis: SCD, pharmacological prophylaxis contraindicated due to Thrombocytopenia  Advance goals of care discussion: Full code  Family  Communication: no family was present at bedside, at the time of interview.  Updated family on 09/01/2018.  Disposition:  Discharge to be determined.  Consultants: Hematology Procedures: Echocardiogram   Scheduled Meds:  benzonatate  100 mg Oral TID   guaiFENesin-dextromethorphan  15 mL Oral QID   hydrALAZINE  25 mg Oral Q8H   insulin aspart  0-15 Units Subcutaneous TID WC   insulin aspart  0-5 Units Subcutaneous QHS   insulin aspart  3 Units Subcutaneous TID WC   insulin glargine  22 Units Subcutaneous QHS   levalbuterol  0.63 mg Nebulization TID   methylPREDNISolone (SOLU-MEDROL) injection  40 mg Intravenous Daily   metoCLOPramide (REGLAN) injection  10 mg Intravenous Q6H   senna-docusate  1 tablet Oral BID   vitamin B-12  100 mcg Oral q morning - 10a   Continuous Infusions:  ceFEPime (MAXIPIME) IV 2 g (08/31/18 1807)   [START ON 09/02/2018] vancomycin     PRN Meds: acetaminophen **OR** acetaminophen, bisacodyl, ondansetron **OR** ondansetron (ZOFRAN) IV Antibiotics: Anti-infectives (From admission, onward)   Start     Dose/Rate Route Frequency Ordered Stop   09/02/18 1800  vancomycin (VANCOCIN) IVPB 750 mg/150 ml premix     750 mg 150 mL/hr over 60 Minutes Intravenous Every 48 hours 08/31/18 1653     08/31/18 1700  vancomycin (VANCOCIN) 1,500 mg in sodium chloride 0.9 % 500 mL IVPB     1,500 mg 250 mL/hr over 120 Minutes Intravenous  Once 08/31/18 1653 08/31/18 2008   08/31/18 1700  ceFEPIme (MAXIPIME) 2 g in sodium chloride 0.9 % 100 mL IVPB     2 g 200 mL/hr over 30 Minutes Intravenous Every 24 hours 08/31/18 1653     08/31/18 1000  cefdinir (OMNICEF) capsule 300 mg  Status:  Discontinued     300 mg Oral Daily 08/30/18 0951 08/31/18 1636   08/30/18 1000  cefTRIAXone (ROCEPHIN) 2 g in sodium chloride 0.9 % 100 mL IVPB  Status:  Discontinued     2 g 200 mL/hr over 30 Minutes Intravenous Every 24 hours 08/30/18 0925 08/30/18 0946   08/30/18 1000  cefdinir  (OMNICEF) capsule 300 mg  Status:  Discontinued     300 mg Oral Every 12 hours 08/30/18 0946 08/30/18 0950   08/29/18 1000  cefdinir (OMNICEF) capsule 300 mg  Status:  Discontinued     300 mg Oral Daily 08/29/18 0749 08/30/18 0925   08/02/2018 1900  ceFEPIme (MAXIPIME) 1 g in sodium chloride 0.9 % 100 mL IVPB  Status:  Discontinued     1 g 200 mL/hr over 30 Minutes Intravenous Every 24 hours 08/13/2018 1825 08/29/18 0749       Objective: Physical Exam: Vitals:   09/01/18 0429 09/01/18 0614 09/01/18 0747 09/01/18 1532  BP: (!) 125/49  (!) 129/47 (!) 136/53  Pulse: 83 75 88 97  Resp: 20 (!) 23    Temp: 98.6 F (37 C)  98.6 F (37 C) 98.7 F (37.1 C)  TempSrc: Oral  Oral Oral  SpO2: 96% 98% 93% 90%  Weight: 80.9 kg     Height:        Intake/Output Summary (Last 24 hours) at 09/01/2018 1657 Last data filed at 09/01/2018 1535 Gross per 24 hour  Intake --  Output 500 ml  Net -500 ml   Filed Weights   08/30/18 1720 08/31/18 0506 09/01/18 0429  Weight: 79.4 kg 80.6 kg 80.9 kg   General: alert and oriented to time, place,  and person. Appear in moderate distress, affect appropriate Eyes: PERRL, Conjunctiva normal ENT: Oral Mucosa Clear, dry  Neck: no JVD, no Abnormal Mass Or lumps Cardiovascular: S1 and S2 Present, no Murmur, peripheral pulses symmetrical Respiratory: increased respiratory effort, Bilateral Air entry equal and Decreased, no use of accessory muscle, bilateral Crackles, no wheezes Abdomen: Bowel Sound present, Soft and no tenderness, no hernia Skin: no rashes  Extremities: bilateral Pedal edema, no calf tenderness Neurologic: normal without focal findings, mental status, speech normal, alert and oriented x3, PERLA, Motor strength 5/5 and symmetric and sensation grossly normal to light touch Gait not checked due to patient safety concerns  Data Reviewed: CBC: Recent Labs  Lab 08/24/2018 1528 08/27/18 0919  08/28/18 0654 08/29/18 0225 08/30/18 0356 08/31/18 0645  09/01/18 0227  WBC 0.7* 0.8*  --  5.6 2.9* 2.2* 3.5* 2.5*  NEUTROABS 0.3* 0.2*  --  3.7 1.8 1.0*  --   --   HGB 6.6* 8.0*   < > 9.7* 8.8* 8.6* 8.2* 8.2*  HCT 19.5* 23.1*   < > 28.0* 26.0* 25.1* 24.0* 23.5*  MCV 90.7 88.2  --  88.9 89.7 90.3 89.6 89.7  PLT 28* 25*  --  26* 22* 21* 20* 16*   < > = values in this interval not displayed.   Basic Metabolic Panel: Recent Labs  Lab 08/28/18 0654 08/29/18 0225 08/30/18 0356 08/31/18 0645 09/01/18 0227  NA 135 135 136 135 134*  K 4.6 4.1 4.4 3.5 4.0  CL 106 107 107 103 101  CO2 18* 19* 20* 20* 20*  GLUCOSE 138* 153* 149* 60* 151*  BUN 26* 23 28* 33* 40*  CREATININE 2.33* 2.15* 2.39* 2.47* 2.37*  CALCIUM 7.9* 7.9* 8.2* 8.4* 8.4*  MG  --   --  2.2 1.8 1.9    Liver Function Tests: Recent Labs  Lab 08/25/2018 1440 08/30/18 0356 08/31/18 0645 09/01/18 0227  AST 16 15 21 24   ALT 13 10 11 13   ALKPHOS 45 37* 45 43  BILITOT 0.6 0.5 1.1 0.7  PROT 7.3 6.0* 6.5 6.6  ALBUMIN 3.2* 2.5* 2.7* 2.5*   Recent Labs  Lab 08/10/2018 1440  LIPASE 30   No results for input(s): AMMONIA in the last 168 hours. Coagulation Profile: No results for input(s): INR, PROTIME in the last 168 hours. Cardiac Enzymes: No results for input(s): CKTOTAL, CKMB, CKMBINDEX, TROPONINI in the last 168 hours. BNP (last 3 results) No results for input(s): PROBNP in the last 8760 hours. CBG: Recent Labs  Lab 08/31/18 0940 08/31/18 1551 08/31/18 2125 09/01/18 1005 09/01/18 1129  GLUCAP 150* 135* 218* 107* 140*   Studies: No results found.   Time spent: 35 minutes  Author: Berle Mull, MD [facility] 09/01/2018 4:57 PM  To reach On-call, see care teams to locate the attending and reach out to them via [email or web address]. If 7PM-7AM, please contact night-coverage If you still have difficulty reaching the attending provider, please page the [facility] (Director on Call) for [facility] on amion for assistance.

## 2018-09-02 ENCOUNTER — Inpatient Hospital Stay (HOSPITAL_COMMUNITY): Payer: Medicare HMO

## 2018-09-02 DIAGNOSIS — J80 Acute respiratory distress syndrome: Secondary | ICD-10-CM

## 2018-09-02 LAB — CBC
HCT: 22.6 % — ABNORMAL LOW (ref 36.0–46.0)
Hemoglobin: 7.7 g/dL — ABNORMAL LOW (ref 12.0–15.0)
MCH: 30.7 pg (ref 26.0–34.0)
MCHC: 34.1 g/dL (ref 30.0–36.0)
MCV: 90 fL (ref 80.0–100.0)
Platelets: 16 10*3/uL — CL (ref 150–400)
RBC: 2.51 MIL/uL — ABNORMAL LOW (ref 3.87–5.11)
RDW: 14.8 % (ref 11.5–15.5)
WBC: 1.8 10*3/uL — ABNORMAL LOW (ref 4.0–10.5)
nRBC: 2.2 % — ABNORMAL HIGH (ref 0.0–0.2)

## 2018-09-02 LAB — COMPREHENSIVE METABOLIC PANEL WITH GFR
ALT: 15 U/L (ref 0–44)
AST: 24 U/L (ref 15–41)
Albumin: 2.4 g/dL — ABNORMAL LOW (ref 3.5–5.0)
Alkaline Phosphatase: 42 U/L (ref 38–126)
Anion gap: 11 (ref 5–15)
BUN: 59 mg/dL — ABNORMAL HIGH (ref 8–23)
CO2: 22 mmol/L (ref 22–32)
Calcium: 8.5 mg/dL — ABNORMAL LOW (ref 8.9–10.3)
Chloride: 101 mmol/L (ref 98–111)
Creatinine, Ser: 2.33 mg/dL — ABNORMAL HIGH (ref 0.44–1.00)
GFR calc Af Amer: 23 mL/min — ABNORMAL LOW
GFR calc non Af Amer: 20 mL/min — ABNORMAL LOW
Glucose, Bld: 123 mg/dL — ABNORMAL HIGH (ref 70–99)
Potassium: 3.9 mmol/L (ref 3.5–5.1)
Sodium: 134 mmol/L — ABNORMAL LOW (ref 135–145)
Total Bilirubin: 0.8 mg/dL (ref 0.3–1.2)
Total Protein: 5.9 g/dL — ABNORMAL LOW (ref 6.5–8.1)

## 2018-09-02 LAB — BLOOD GAS, ARTERIAL
Acid-base deficit: 1.1 mmol/L (ref 0.0–2.0)
Bicarbonate: 23 mmol/L (ref 20.0–28.0)
Drawn by: 345601
FIO2: 100
O2 Saturation: 94.2 %
Patient temperature: 98.6
pCO2 arterial: 38.2 mmHg (ref 32.0–48.0)
pH, Arterial: 7.397 (ref 7.350–7.450)
pO2, Arterial: 74.1 mmHg — ABNORMAL LOW (ref 83.0–108.0)

## 2018-09-02 LAB — GLUCOSE, CAPILLARY
Glucose-Capillary: 101 mg/dL — ABNORMAL HIGH (ref 70–99)
Glucose-Capillary: 137 mg/dL — ABNORMAL HIGH (ref 70–99)
Glucose-Capillary: 98 mg/dL (ref 70–99)
Glucose-Capillary: 209 mg/dL — ABNORMAL HIGH (ref 70–99)

## 2018-09-02 LAB — MAGNESIUM: Magnesium: 2.1 mg/dL (ref 1.7–2.4)

## 2018-09-02 LAB — PROCALCITONIN: Procalcitonin: 1.47 ng/mL

## 2018-09-02 MED ORDER — FUROSEMIDE 10 MG/ML IJ SOLN
60.0000 mg | Freq: Two times a day (BID) | INTRAMUSCULAR | Status: DC
  Administered 2018-09-02 – 2018-09-05 (×6): 60 mg via INTRAVENOUS
  Filled 2018-09-02 (×6): qty 6

## 2018-09-02 MED ORDER — GUAIFENESIN ER 600 MG PO TB12
1200.0000 mg | ORAL_TABLET | Freq: Two times a day (BID) | ORAL | Status: DC
  Administered 2018-09-02: 1200 mg via ORAL
  Filled 2018-09-02: qty 2

## 2018-09-02 MED ORDER — DEXTROMETHORPHAN POLISTIREX ER 30 MG/5ML PO SUER
30.0000 mg | Freq: Two times a day (BID) | ORAL | Status: DC
  Administered 2018-09-02: 30 mg via ORAL
  Filled 2018-09-02 (×5): qty 5

## 2018-09-02 MED ORDER — HYDRALAZINE HCL 20 MG/ML IJ SOLN: 10.0000 mg | INTRAMUSCULAR | Status: DC | PRN

## 2018-09-02 MED ORDER — POLYETHYLENE GLYCOL 3350 17 G PO PACK
17.0000 g | PACK | Freq: Every day | ORAL | Status: DC
  Filled 2018-09-02: qty 1

## 2018-09-02 MED ORDER — HYDROCOD POLST-CPM POLST ER 10-8 MG/5ML PO SUER: 5.0000 mL | Freq: Two times a day (BID) | ORAL | Status: DC | PRN

## 2018-09-02 MED ORDER — FAMOTIDINE 20 MG PO TABS: 20.0000 mg | ORAL_TABLET | Freq: Every day | ORAL | Status: DC

## 2018-09-02 MED ORDER — METHYLPREDNISOLONE SODIUM SUCC 125 MG IJ SOLR
60.0000 mg | Freq: Three times a day (TID) | INTRAMUSCULAR | Status: DC
  Administered 2018-09-02 – 2018-09-04 (×6): 60 mg via INTRAVENOUS
  Filled 2018-09-02 (×6): qty 2

## 2018-09-02 MED ORDER — FUROSEMIDE 10 MG/ML IJ SOLN: 40.0000 mg | Freq: Once | INTRAMUSCULAR | Status: DC

## 2018-09-02 MED ORDER — SENNOSIDES-DOCUSATE SODIUM 8.6-50 MG PO TABS
2.0000 | ORAL_TABLET | Freq: Two times a day (BID) | ORAL | Status: DC
  Administered 2018-09-02: 2 via ORAL
  Filled 2018-09-02: qty 2

## 2018-09-02 NOTE — Consult Note (Signed)
NAME:  Ruth Douglas, MRN:  [id number], DOB:  Mar 30, 1940, LOS: 7 ADMISSION DATE:  08/05/2018, CONSULTATION DATE:  09/02/18 REFERRING MD:  Posey Pronto, CHIEF COMPLAINT:  SOB   Brief History   78 year old woman w/ hx of DM2, CKD, recurrent UTI, HTN, MDS p/w pyelonephritis.  Hospital course complicated by development of bilateral airspace disease and persistent severe hypoxemia of unclear etiology.  History of present illness   Patient admitted on 07/31/2018 for abd pain, dysuria, N, RUQ pain found to have pyelonephritis.  Also had some new oxygen requirement with a question of aspiration.  She has been on strong antibiotics since admission but hypoxemia has progressed to point where she needs BIPAP more often during the day and not.  Has been placed empirically on steroids and trial of diuresis as well with results pending.  Due to this persistent airspace disease pulmonology is consulted.  Patient is a nonsmoker, has no complaints of cough but does have orthopnea, PND and DOE.  No chest pains. No history of heart failure.  Notable labs include acute on chronic anemia as well as worsening leuokopenia.  She denies aspiration symptoms.   Past Medical History  DM2 CKD3 Recurrent UTI HTN MDS: "Hematology performed a bone marrow biopsy on her on 07/13/2018 which showed hypercellular marrow (70%) with trilineage hematopoiesis, megakaryocytic dyspoiesis and increased blasts (approximately 5 to 7%). Findings are concerning for involvement by a myeloid neoplasm (e.g. myelodysplastic syndrome with excess blasts 1). However, reactive/benign causes of cytopenias must be excluded clinically (e.g. vitamin/mineral deficiencies, toxins, autoimmune processes, etc). FISH detected presence of both del(5q) and tri(8). 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  Hematology recommended for her to begin treatment for MDS, but the patient wanted to discuss this  further with her son before deciding.  The patient states that she notified her hematologist that she wished to follow-up closer to home in [place], [place].  Today, she states that this has not yet been arranged for her."  Prior toe amputations due to poorly controlled DM.  Significant Hospital Events   6/28 Admitted  Consults:  Hematology PCCM  Procedures:  NA  Significant Diagnostic Tests:  CT A/P IMPRESSION: 1. Redemonstrated small right pleural effusion with associated atelectasis or consolidation, slightly increased compared to prior examination. No significant change in dense consolidation of the partially included lateral segment right middle lobe. Extensive, new dense consolidation of the left lung base. Findings suggest ongoing or worsening infection/aspiration. 2. Multiple small calcifications in the vicinity of the renal calices bilaterally, which may be small urinary tract calculi or vascular calcifications. Previously seen left-sided hydronephrosis is resolved. There is no ureteral calculus. Mild thickening of the urinary bladder, unchanged from prior. 3.  Mild anasarca, new compared to prior examination. 4.  Other chronic and incidental findings as detailed above.  CXR 7/5 worsening multifocal airspace disease  Procal 2 (7/3)>>1.5 (7/5) Micro Data:  Blood/urine cultures unrevealing  Antimicrobials:  Cefepime 6/28-6/30, 7/3>>> Vanc 7/3 x 1 Cefdinir 7/1-7/3   Interim history/subjective:  See HPI.  Objective   Blood pressure 135/71, pulse 79, temperature 98 F (36.7 C), temperature source Oral, resp. rate (!) 22, height '5\' 3"'  (1.6 m), weight 79.4 kg, SpO2 92 %.    FiO2 (%):  [60 %] 60 %   Intake/Output Summary (Last 24 hours) at 09/02/2018 1812 Last data filed at 09/02/2018 1500 Gross per 24 hour  Intake 200 ml  Output 500 ml  Net -300 ml   Filed Weights   08/31/18 0506 09/01/18 0429 09/02/18 0426  Weight: 80.6 kg 80.9 kg 79.4 kg     Examination: GEN: chronically ill woman on BIPAP HEENT: BIPAP in place with good seal, some dried blood arounds nares CV: RRR, ext warm PULM: Crackles at bases, no accessory muscle use GI: Soft, +BS EXT: Trace edema, BL TMA noted well healed NEURO: moves all 4 ext to command PSYCH: AOx3, fair insight SKIN: Slightly jaundiced   Resolved Hospital Problem list   NA  Assessment & Plan:  # Acute hypoxemic respiratory failure in settings of (A) bone marrow failure with immunosuppression and thrombocytopenia, (B) presumed fluid rescuscitation for sepsis, (C) blood product administration. Differential remains broad and includes DAH, fluid overload, opportunistic infection, TRALI, TACO, recurrent aspiration, inflammatory pneumonitis, and ARDS. # CKD with mild superimposed AKI # DM2 # Deconditioning # Recurrent UTIs  - Try to get another dry CT to have better look at lung parenchyma - Strengthen diuresis and continue steroids as you are doing, trend Cr/K/BUN/CO2 - SLP eval with low threshold for MBSS - Fine to continue abx as ordered, this really does not appear infectious at this time - Consider palliative care consult, MDS carries an abysmal prognosis by itself - Wean to HFNC as able - Consider switching hydralazine to another agent, it is associated with acute lung injury occasionally - Will follow with you   Erskine Emery MD PCCM      Labs   CBC: Recent Labs  Lab 08/27/18 0919  08/28/18 0654 08/29/18 0225 08/30/18 0356 08/31/18 0645 09/01/18 0227 09/02/18 0440  WBC 0.8*  --  5.6 2.9* 2.2* 3.5* 2.5* 1.8*  NEUTROABS 0.2*  --  3.7 1.8 1.0*  --   --   --   HGB 8.0*   < > 9.7* 8.8* 8.6* 8.2* 8.2* 7.7*  HCT 23.1*   < > 28.0* 26.0* 25.1* 24.0* 23.5* 22.6*  MCV 88.2  --  88.9 89.7 90.3 89.6 89.7 90.0  PLT 25*  --  26* 22* 21* 20* 16* 16*   < > = values in this interval not displayed.    Basic Metabolic Panel: Recent Labs  Lab 08/29/18 0225 08/30/18 0356 08/31/18 0645  09/01/18 0227 09/02/18 0440  NA 135 136 135 134* 134*  K 4.1 4.4 3.5 4.0 3.9  CL 107 107 103 101 101  CO2 19* 20* 20* 20* 22  GLUCOSE 153* 149* 60* 151* 123*  BUN 23 28* 33* 40* 59*  CREATININE 2.15* 2.39* 2.47* 2.37* 2.33*  CALCIUM 7.9* 8.2* 8.4* 8.4* 8.5*  MG  --  2.2 1.8 1.9 2.1   GFR: Estimated Creatinine Clearance: 20.8 mL/min (A) (by C-G formula based on SCr of 2.33 mg/dL (H)). Recent Labs  Lab 08/21/2018 2047 08/27/18 0027  08/30/18 0356 08/31/18 0645 08/31/18 0810 09/01/18 0227 09/02/18 0440  PROCALCITON  --   --   --   --   --  2.02 1.96 1.47  WBC  --   --    < > 2.2* 3.5*  --  2.5* 1.8*  LATICACIDVEN 1.1 1.1  --  1.1  --   --   --   --    < > = values in this interval not displayed.    Liver Function Tests: Recent Labs  Lab 08/30/18 0356 08/31/18 0645 09/01/18 0227 09/02/18 0440  AST '15 21 24 24  ' ALT '10 11 13 15  ' ALKPHOS 37* 45 43 42  BILITOT  0.5 1.1 0.7 0.8  PROT 6.0* 6.5 6.6 5.9*  ALBUMIN 2.5* 2.7* 2.5* 2.4*   No results for input(s): LIPASE, AMYLASE in the last 168 hours. No results for input(s): AMMONIA in the last 168 hours.  ABG    Component Value Date/Time   PHART 7.329 (L) 08/30/2018 1613   PCO2ART 38.2 08/30/2018 1613   PO2ART 52.0 (L) 08/30/2018 1613   HCO3 19.6 (L) 08/30/2018 1613   TCO2 27 09/23/2014 1019   ACIDBASEDEF 5.3 (H) 08/30/2018 1613   O2SAT 84.0 08/30/2018 1613     Coagulation Profile: No results for input(s): INR, PROTIME in the last 168 hours.  Cardiac Enzymes: No results for input(s): CKTOTAL, CKMB, CKMBINDEX, TROPONINI in the last 168 hours.  HbA1C: HB A1C (BAYER DCA - WAIVED)  Date/Time Value Ref Range Status  08/13/2015 10:29 AM 11.8 (H) <7.0 % Final    Comment:                                          Diabetic Adult            <7.0                                       Healthy Adult        4.3 - 5.7                                                           (DCCT/NGSP) American Diabetes Association's Summary  of Glycemic Recommendations for Adults with Diabetes: Hemoglobin A1c <7.0%. More stringent glycemic goals (A1c <6.0%) may further reduce complications at the cost of increased risk of hypoglycemia.    Hgb A1c MFr Bld  Date/Time Value Ref Range Status  08/27/2018 06:41 PM 6.9 (H) 4.8 - 5.6 % Final    Comment:    (NOTE) Pre diabetes:          5.7%-6.4% Diabetes:              >6.4% Glycemic control for   <7.0% adults with diabetes   06/19/2014 08:20 AM 11.9 (H) 4.8 - 5.6 % Final    Comment:    (NOTE)         Pre-diabetes: 5.7 - 6.4         Diabetes: >6.4         Glycemic control for adults with diabetes: <7.0     CBG: Recent Labs  Lab 09/01/18 1700 09/01/18 2142 09/02/18 0745 09/02/18 1129 09/02/18 1609  GLUCAP 230* 203* 101* 137* 98    Review of Systems:   Full 14 point ROS performed and is negative except for HPI  Past Medical History  She,  has a past medical history of Blood transfusion without reported diagnosis (years ago), CHF (congestive heart failure) (HCC) (years ago), Diabetes mellitus without complication ([facility]), History of kidney stones, Hypertension, and UTI (lower urinary tract infection).   Surgical History    Past Surgical History:  Procedure Laterality Date  . ABDOMINAL HYSTERECTOMY  08/2014   [facility]  . ACHILLES TENDON SURGERY Left 06/01/2012   Procedure: PERCUTANEOUS TENDON ACHILLES LENGTHING;  Surgeon: Wylene Simmer, MD;  Location: [facility];  Service: Orthopedics;  Laterality: Left;  . AMPUTATION Left 06/01/2012   Procedure: LEFT FOOT TRANSMETATARSEL AMPUTATION;  Surgeon: Wylene Simmer, MD;  Location: [facility];  Service: Orthopedics;  Laterality: Left;  . AMPUTATION Right 06/20/2014   Procedure: AMPUTATION FOOT;  Surgeon: Newt Minion, MD;  Location: [facility];  Service: Orthopedics;  Laterality: Right;  . CESAREAN SECTION    . CYSTOSCOPY W/ URETERAL STENT PLACEMENT Left 08/08/2014   Procedure: CYSTOSCOPY WITH RETROGRADE PYELOGRAM/URETERAL STENT PLACEMENT;   Surgeon: Festus Aloe, MD;  Location: [facility];  Service: Urology;  Laterality: Left;  . CYSTOSCOPY WITH RETROGRADE PYELOGRAM, URETEROSCOPY AND STENT PLACEMENT Left 09/05/2014   Procedure: CYSTO, LEFT RETROGRADE PYELOGRAM, LEFT URETEROSCOPY AND STENT PLACEMENT;  Surgeon: Festus Aloe, MD;  Location: [facility];  Service: Urology;  Laterality: Left;  . CYSTOSCOPY WITH STENT PLACEMENT  01/16/2012   Procedure: CYSTOSCOPY WITH STENT PLACEMENT;  Surgeon: Dutch Gray, MD;  Location: [facility];  Service: Urology;  Laterality: Left;  cysto, retrograde , ureteroscopy , stone extraction with laser lithotripsy, stent placement on left, bladder biopsies with fulgaration  . EYE SURGERY Bilateral    lens replacements for cataracts  . HOLMIUM LASER APPLICATION  12/14/5100   Procedure: HOLMIUM LASER APPLICATION;  Surgeon: Dutch Gray, MD;  Location: [facility];  Service: Urology;  Laterality: Left;  . HOLMIUM LASER APPLICATION Left 07/06/5275   Procedure: LEFT HOLMIUM LASER APPLICATION;  Surgeon: Festus Aloe, MD;  Location: [facility];  Service: Urology;  Laterality: Left;  . TOE AMPUTATION       Social History   reports that she has never smoked. She has never used smokeless tobacco. She reports that she does not drink alcohol or use drugs.   Family History   Her family history includes Diabetes Mellitus II in her father and mother; Kidney cancer in her brother; Lung cancer in her sister.   Allergies Allergies  Allergen Reactions  . Phenergan [Promethazine Hcl] Other (See Comments)    "causes me to be out of my head"      Home Medications  Prior to Admission medications   Medication Sig Start Date End Date Taking? Authorizing Provider  amLODipine (NORVASC) 10 MG tablet Take 10 mg by mouth every morning.  08/13/18  Yes [provider]  bisacodyl (DULCOLAX) 10 MG suppository Place 10 mg rectally daily as needed (constipation).   Yes [provider]  Cyanocobalamin (VITAMIN B-12 PO) Take 1  tablet by mouth every morning.   Yes [provider]  Ferrous Sulfate (IRON PO) Take 1 tablet by mouth every morning.   Yes [provider]  TRESIBA FLEXTOUCH 100 UNIT/ML SOPN FlexTouch Pen INJECT 18 UNITS INTO THE SKIN AT BEDTIME. Patient taking differently: Inject 22 Units into the skin at bedtime.  02/15/16  Yes Dettinger, Fransisca Kaufmann, MD  ciprofloxacin (CIPRO) 500 MG tablet Take 500 mg by mouth daily. 08/20/18   [provider]

## 2018-09-02 NOTE — Progress Notes (Signed)
Patient continues to refuse Bipap at night.

## 2018-09-02 NOTE — Progress Notes (Signed)
Pt just put on HHFNC.  ABG in 30 minutes to get an accurate reading.

## 2018-09-02 NOTE — Progress Notes (Signed)
[facility] Progress Note  Patient: Ruth Douglas CZY:[id number]   PCP: Majel Homer, PA DOB: 11-30-1940   DOA: 08/25/2018   DOS: 09/02/2018   Date of Service: the patient was seen and examined on 09/02/2018  Brief hospital course: Pt. with PMH of type II DM, CKD 3, recurrent UTI, HTN, myelodysplastic syndrome; admitted on 08/25/2018, presented with complaint of abdominal fullness, dysuria, nausea, right upper quadrant pain, was found to have AKI, symptomatic anemia, bilateral lower lobe pneumonia, pyelonephritis.  SARS-CoV-2 negative.  Patient is now getting more hypoxic secondary to volume overload and pneumonia. Currently further plan is continue IV biotics.  Subjective: Denies any acute complaint but becomes more short of breath.  Required BiPAP and is unable to come off of the BiPAP.  No nausea no vomiting reported today.  No cough no hemoptysis.  No chest pain no abdominal pain.  Assessment and Plan: 1. AKI on chronic kidney disease stage III Baseline serum creatinine 1.5-1.75 On presentation 2.67 with elevated BUN and hyponatremia. Currently creatinine around 2, normal sodium normal potassium. Still not back to baseline.   Strict in and out. Urine output is significantly less.  1000 cc in the last 24 hours. Starting her on IV Lasix 60 mg twice daily. We need nephrology consultation if renal function worsens or urine output does not improve.  2. UTI recurrent,  UA nitrite negative Urine culture insignificant growth. Recent urine culture on 08/17/2018 at [facility] grew significant E. coli, resistant to ampicillin and Augmentin. CT renal shows mild left hydronephrosis without stone, diffuse bladder wall thickening and bilateral nonobstructive renal stone. Patient has known history of urological issues on the left ureter with recurrent UTI and follows up with urology. Patient's pain is primarily on the right flank. Blood cultures negative   Initially being treated with IV  cefepime. Based on the prior culture transition to [facility].  Given her worsening respiratory distress we will transition her back to IV antibiotics.  Requires a urology follow-up.  Has seen urology at [facility]  3. Bilateral lower lobe pneumonia. Likely in the setting of aspiration from nausea and vomiting Acute hypoxic respiratory failure-likely ARDS versus TRALI versus pulmonary hemorrhage CT on chest x-ray shows evidence of bilateral lower lobe pneumonia. Currently on 12 L of oxygen does not use any oxygen at home. Blood cultures as above are negative. Incentive spirometry. Wean oxygen to room air. Continue stepdown unit for close monitoring and BiPAP support as needed. VQ scan is negative for any PE. Suspect that this is all pneumonia.  But other etiologies cannot be ruled out. Patient was started on 40 mg daily steroids on 08/31/2018.  Will increase to 60 mg every 8 hours on 09/02/2018. Pulmonary consulted appreciate assistance. Continue pulmonary toilet flutter device and incentive spirometry.  4. Recurrent nausea and vomiting. Currently resolved. Likely in the setting of infection. Tolerating full liquid diet.  Advance to soft diet. CT abdomen scan negative for any acute abnormality. Due to persistent nausea and vomiting repeat CT scan was performed, negative.  Scheduled Reglan ordered.  5.  Acute on chronic diastolic CHF. Essential hypertension Volume overload from aggressive IV resuscitation. Echocardiogram shows preserved EF.  No wall motion or valvular abnormality. Reduce Norvasc from 10 mg to 5 mg.  6.  Myelodysplastic syndrome. Pancytopenia. Symptomatic anemia. Received 2 PRBC. H&H relatively stable. No active bleeding reported. Received Granix after discussion with hematology. Neutropenia improving. Platelets are relatively low but no active bleeding. Earlier provider discussed with hematology at [facility] who will follow-up with  the patient on discharge.  7. Type  2 Diabetes Mellitus, uncontroled with Hyperglycemia, renal complication last hemoglobin A1c was 6.9 this admission hold her home hypoglycemic agents. On insulin sliding scale sensitive with Basal insulin lantus.  8.  Obesity. Body mass index is 31.01 kg/m.   Diet: Encourage fluids DVT Prophylaxis: SCD, pharmacological prophylaxis contraindicated due to Thrombocytopenia  Advance goals of care discussion: Full code  Family Communication: no family was present at bedside, at the time of interview.  Updated family on 09/01/2018.  Disposition:  Discharge to be determined.  Consultants: Hematology Procedures: Echocardiogram   Scheduled Meds: . benzonatate  100 mg Oral TID  . dextromethorphan  30 mg Oral BID  . famotidine  20 mg Oral Daily  . furosemide  60 mg Intravenous BID  . guaiFENesin  1,200 mg Oral BID  . hydrALAZINE  25 mg Oral Q8H  . insulin aspart  0-15 Units Subcutaneous TID WC  . insulin aspart  0-5 Units Subcutaneous QHS  . insulin aspart  3 Units Subcutaneous TID WC  . insulin glargine  22 Units Subcutaneous QHS  . levalbuterol  0.63 mg Nebulization TID  . methylPREDNISolone (SOLU-MEDROL) injection  60 mg Intravenous Q8H  . metoCLOPramide (REGLAN) injection  10 mg Intravenous Q6H  . polyethylene glycol  17 g Oral Daily  . senna-docusate  2 tablet Oral BID   Continuous Infusions: . ceFEPime (MAXIPIME) IV 2 g (09/01/18 1849)   PRN Meds: acetaminophen **OR** acetaminophen, bisacodyl, chlorpheniramine-HYDROcodone, hydrALAZINE, ondansetron **OR** ondansetron (ZOFRAN) IV Antibiotics: Anti-infectives (From admission, onward)   Start     Dose/Rate Route Frequency Ordered Stop   09/02/18 1800  vancomycin (VANCOCIN) IVPB 750 mg/150 ml premix  Status:  Discontinued     750 mg 150 mL/hr over 60 Minutes Intravenous Every 48 hours 08/31/18 1653 09/02/18 1234   08/31/18 1700  vancomycin (VANCOCIN) 1,500 mg in sodium chloride 0.9 % 500 mL IVPB     1,500 mg 250 mL/hr over 120  Minutes Intravenous  Once 08/31/18 1653 08/31/18 2008   08/31/18 1700  ceFEPIme (MAXIPIME) 2 g in sodium chloride 0.9 % 100 mL IVPB     2 g 200 mL/hr over 30 Minutes Intravenous Every 24 hours 08/31/18 1653     08/31/18 1000  cefdinir (OMNICEF) capsule 300 mg  Status:  Discontinued     300 mg Oral Daily 08/30/18 0951 08/31/18 1636   08/30/18 1000  cefTRIAXone (ROCEPHIN) 2 g in sodium chloride 0.9 % 100 mL IVPB  Status:  Discontinued     2 g 200 mL/hr over 30 Minutes Intravenous Every 24 hours 08/30/18 0925 08/30/18 0946   08/30/18 1000  cefdinir (OMNICEF) capsule 300 mg  Status:  Discontinued     300 mg Oral Every 12 hours 08/30/18 0946 08/30/18 0950   08/29/18 1000  cefdinir (OMNICEF) capsule 300 mg  Status:  Discontinued     300 mg Oral Daily 08/29/18 0749 08/30/18 0925   08/25/2018 1900  ceFEPIme (MAXIPIME) 1 g in sodium chloride 0.9 % 100 mL IVPB  Status:  Discontinued     1 g 200 mL/hr over 30 Minutes Intravenous Every 24 hours 08/25/2018 1825 08/29/18 0749       Objective: Physical Exam: Vitals:   09/01/18 2113 09/02/18 0004 09/02/18 0426 09/02/18 0739  BP: (!) 146/57 (!) 145/53 (!) 147/54 135/71  Pulse: 95 93 86 79  Resp: (!) 35 (!) 28 (!) 26 (!) 22  Temp: 98.5 F (36.9 C) 98.1 F (36.7 C)  98.2 F (36.8 C) 98 F (36.7 C)  TempSrc: Oral Oral Oral Oral  SpO2: 91% 92% 94% 92%  Weight:   79.4 kg   Height:        Intake/Output Summary (Last 24 hours) at 09/02/2018 1816 Last data filed at 09/02/2018 1500 Gross per 24 hour  Intake 200 ml  Output 500 ml  Net -300 ml   Filed Weights   08/31/18 0506 09/01/18 0429 09/02/18 0426  Weight: 80.6 kg 80.9 kg 79.4 kg   General: alert and oriented to time, place, and person. Appear in moderate distress, affect appropriate Eyes: PERRL, Conjunctiva normal ENT: Oral Mucosa Clear, dry  Neck: no JVD, no Abnormal Mass Or lumps Cardiovascular: S1 and S2 Present, no Murmur, peripheral pulses symmetrical Respiratory: increased respiratory  effort, Bilateral Air entry equal and Decreased, no use of accessory muscle, bilateral Crackles, no wheezes Abdomen: Bowel Sound present, Soft and no tenderness, no hernia Skin: no rashes  Extremities: bilateral Pedal edema, no calf tenderness Neurologic: normal without focal findings, mental status, speech normal, alert and oriented x3, PERLA, Motor strength 5/5 and symmetric and sensation grossly normal to light touch Gait not checked due to patient safety concerns  Data Reviewed: CBC: Recent Labs  Lab 08/27/18 0919  08/28/18 0654 08/29/18 0225 08/30/18 0356 08/31/18 0645 09/01/18 0227 09/02/18 0440  WBC 0.8*  --  5.6 2.9* 2.2* 3.5* 2.5* 1.8*  NEUTROABS 0.2*  --  3.7 1.8 1.0*  --   --   --   HGB 8.0*   < > 9.7* 8.8* 8.6* 8.2* 8.2* 7.7*  HCT 23.1*   < > 28.0* 26.0* 25.1* 24.0* 23.5* 22.6*  MCV 88.2  --  88.9 89.7 90.3 89.6 89.7 90.0  PLT 25*  --  26* 22* 21* 20* 16* 16*   < > = values in this interval not displayed.   Basic Metabolic Panel: Recent Labs  Lab 08/29/18 0225 08/30/18 0356 08/31/18 0645 09/01/18 0227 09/02/18 0440  NA 135 136 135 134* 134*  K 4.1 4.4 3.5 4.0 3.9  CL 107 107 103 101 101  CO2 19* 20* 20* 20* 22  GLUCOSE 153* 149* 60* 151* 123*  BUN 23 28* 33* 40* 59*  CREATININE 2.15* 2.39* 2.47* 2.37* 2.33*  CALCIUM 7.9* 8.2* 8.4* 8.4* 8.5*  MG  --  2.2 1.8 1.9 2.1    Liver Function Tests: Recent Labs  Lab 08/30/18 0356 08/31/18 0645 09/01/18 0227 09/02/18 0440  AST 15 21 24 24   ALT 10 11 13 15   ALKPHOS 37* 45 43 42  BILITOT 0.5 1.1 0.7 0.8  PROT 6.0* 6.5 6.6 5.9*  ALBUMIN 2.5* 2.7* 2.5* 2.4*   No results for input(s): LIPASE, AMYLASE in the last 168 hours. No results for input(s): AMMONIA in the last 168 hours. Coagulation Profile: No results for input(s): INR, PROTIME in the last 168 hours. Cardiac Enzymes: No results for input(s): CKTOTAL, CKMB, CKMBINDEX, TROPONINI in the last 168 hours. BNP (last 3 results) No results for input(s):  PROBNP in the last 8760 hours. CBG: Recent Labs  Lab 09/01/18 1700 09/01/18 2142 09/02/18 0745 09/02/18 1129 09/02/18 1609  GLUCAP 230* 203* 101* 137* 98   Studies: Dg Chest Port 1 View  Result Date: 09/02/2018 CLINICAL DATA:  Dyspnea EXAM: PORTABLE CHEST 1 VIEW COMPARISON:  08/31/2018 chest radiograph. FINDINGS: Stable cardiomediastinal silhouette with normal heart size. No pneumothorax. No pleural effusion. Worsening severe fluffy patchy opacities throughout both lungs with slight sparing of the lung bases.  IMPRESSION: Worsening severe fluffy patchy opacities throughout both lungs with slight sparing of the lung bases. Differential includes multilobar pneumonia or severe pulmonary edema (potentially noncardiogenic given normal heart size). Electronically Signed   By: Ilona Sorrel M.D.   On: 09/02/2018 10:55     Time spent: 35 minutes  Author: Berle Mull, MD [facility] 09/02/2018 6:16 PM  To reach On-call, see care teams to locate the attending and reach out to them via [email or web address]. If 7PM-7AM, please contact night-coverage If you still have difficulty reaching the attending provider, please page the [facility] (Director on Call) for [facility] on amion for assistance.

## 2018-09-03 ENCOUNTER — Inpatient Hospital Stay (HOSPITAL_COMMUNITY): Payer: Medicare HMO

## 2018-09-03 DIAGNOSIS — J9811 Atelectasis: Secondary | ICD-10-CM

## 2018-09-03 DIAGNOSIS — J9601 Acute respiratory failure with hypoxia: Secondary | ICD-10-CM

## 2018-09-03 DIAGNOSIS — Z515 Encounter for palliative care: Secondary | ICD-10-CM

## 2018-09-03 DIAGNOSIS — Z7189 Other specified counseling: Secondary | ICD-10-CM

## 2018-09-03 DIAGNOSIS — J181 Lobar pneumonia, unspecified organism: Secondary | ICD-10-CM

## 2018-09-03 LAB — GLUCOSE, CAPILLARY
Glucose-Capillary: 168 mg/dL — ABNORMAL HIGH (ref 70–99)
Glucose-Capillary: 200 mg/dL — ABNORMAL HIGH (ref 70–99)
Glucose-Capillary: 216 mg/dL — ABNORMAL HIGH (ref 70–99)
Glucose-Capillary: 193 mg/dL — ABNORMAL HIGH (ref 70–99)

## 2018-09-03 LAB — COMPREHENSIVE METABOLIC PANEL
ALT: 15 U/L (ref 0–44)
AST: 31 U/L (ref 15–41)
Albumin: 2.2 g/dL — ABNORMAL LOW (ref 3.5–5.0)
Alkaline Phosphatase: 41 U/L (ref 38–126)
Anion gap: 12 (ref 5–15)
BUN: 68 mg/dL — ABNORMAL HIGH (ref 8–23)
CO2: 23 mmol/L (ref 22–32)
Calcium: 8.5 mg/dL — ABNORMAL LOW (ref 8.9–10.3)
Chloride: 100 mmol/L (ref 98–111)
Creatinine, Ser: 2.15 mg/dL — ABNORMAL HIGH (ref 0.44–1.00)
GFR calc Af Amer: 25 mL/min — ABNORMAL LOW (ref >60)
GFR calc non Af Amer: 22 mL/min — ABNORMAL LOW (ref >60)
Glucose, Bld: 169 mg/dL — ABNORMAL HIGH (ref 70–99)
Potassium: 4 mmol/L (ref 3.5–5.1)
Sodium: 135 mmol/L (ref 135–145)
Total Bilirubin: 1.8 mg/dL — ABNORMAL HIGH (ref 0.3–1.2)
Total Protein: 6 g/dL — ABNORMAL LOW (ref 6.5–8.1)

## 2018-09-03 LAB — CBC
HCT: 22.5 % — ABNORMAL LOW (ref 36.0–46.0)
Hemoglobin: 8 g/dL — ABNORMAL LOW (ref 12.0–15.0)
MCH: 32.4 pg (ref 26.0–34.0)
MCHC: 35.6 g/dL (ref 30.0–36.0)
MCV: 91.1 fL (ref 80.0–100.0)
Platelets: 32 10*3/uL — ABNORMAL LOW (ref 150–400)
RBC: 2.47 MIL/uL — ABNORMAL LOW (ref 3.87–5.11)
RDW: 15.1 % (ref 11.5–15.5)
WBC: 1.5 10*3/uL — ABNORMAL LOW (ref 4.0–10.5)
nRBC: 1.4 % — ABNORMAL HIGH (ref 0.0–0.2)

## 2018-09-03 LAB — MAGNESIUM: Magnesium: 2.2 mg/dL (ref 1.7–2.4)

## 2018-09-03 MED ORDER — IPRATROPIUM-ALBUTEROL 0.5-2.5 (3) MG/3ML IN SOLN
3.0000 mL | Freq: Four times a day (QID) | RESPIRATORY_TRACT | Status: DC
  Administered 2018-09-03 – 2018-09-05 (×7): 3 mL via RESPIRATORY_TRACT
  Filled 2018-09-03 (×7): qty 3

## 2018-09-03 MED ORDER — METOCLOPRAMIDE HCL 5 MG/ML IJ SOLN
5.0000 mg | Freq: Four times a day (QID) | INTRAMUSCULAR | Status: DC
  Administered 2018-09-03 – 2018-09-04 (×4): 5 mg via INTRAVENOUS
  Filled 2018-09-03 (×4): qty 2

## 2018-09-03 NOTE — Progress Notes (Signed)
Physical Therapy Treatment Patient Details Name: Ruth Douglas MRN: [id number] DOB: 10-03-1940 Today's Date: 09/03/2018    History of Present Illness 78 year old female with diabetes mellitus on insulin, chronic kidney disease stage III (baseline creatinine 1.2-1.4), left ureteral strictures with recurrent UTIs, hypertension.  Recent hospitalization at [facility] found to have myelodysplastic syndrome.  Seen by hematologist as outpatient.  Admitted withabdominal fullness, dysuria and nausea along with right upper quadrant pain.  found to have bilateral LL PNA and UTI.    PT Comments    Pt agreeable to therapy today, however is limited by oxygen desaturation (see General Comments) Pt able to come to EoB with minA. Pt able to perform limited exercises before O2 dropped and she began to complain of numbness in her LE. Pt may need SNF level rehab at discharge. PT will continue to follow acutely.   Follow Up Recommendations  Home health PT;Supervision/Assistance - 24 hour     Equipment Recommendations  None recommended by PT    Recommendations for Other Services       Precautions / Restrictions Precautions Precautions: Fall Precaution Comments: newly oxygen dependent Restrictions Weight Bearing Restrictions: No    Mobility  Bed Mobility Overal bed mobility: Needs Assistance Bed Mobility: Sit to Supine       Sit to supine: HOB elevated;Min assist   General bed mobility comments: minA for pad scoot to come to EoB on L side, pt reports she prefers to go to R in future  Transfers                 General transfer comment: deferred today due to SaO2 dropping to 85%O2 with exercise        Balance Overall balance assessment: Needs assistance Sitting-balance support: Feet supported Sitting balance-Leahy Scale: Good     Standing balance support: Bilateral upper extremity supported Standing balance-Leahy Scale: Poor Standing balance comment: UE support for balance                             Cognition Arousal/Alertness: Awake/alert Behavior During Therapy: WFL for tasks assessed/performed Overall Cognitive Status: Within Functional Limits for tasks assessed                                        Exercises General Exercises - Lower Extremity Ankle Circles/Pumps: AROM;Both;10 reps;Seated Long Arc Quad: AROM;Both;10 reps;Seated Hip ABduction/ADduction: AROM;Both;10 reps;Seated Hip Flexion/Marching: AROM;Both;10 reps;Seated    General Comments General comments (skin integrity, edema, etc.): Pt on 30L O2 via [place] with SaO2 96%O2 with exercise at EoB pt SaO2 dropped to 85%O2, pt also reports numbness with LE in dependent position       Pertinent Vitals/Pain Pain Assessment: No/denies pain Faces Pain Scale: No hurt           PT Goals (current goals can now be found in the care plan section) Acute Rehab PT Goals Patient Stated Goal: to improve breathing PT Goal Formulation: With patient Time For Goal Achievement: 09/13/18 Potential to Achieve Goals: Fair Progress towards PT goals: Not progressing toward goals - comment(increased O2 demand)    Frequency    Min 3X/week      PT Plan Current plan remains appropriate    Co-evaluation              AM-PAC PT "6 Clicks" Mobility   Outcome Measure  Help needed  turning from your back to your side while in a flat bed without using bedrails?: None Help needed moving from lying on your back to sitting on the side of a flat bed without using bedrails?: None Help needed moving to and from a bed to a chair (including a wheelchair)?: A Little Help needed standing up from a chair using your arms (e.g., wheelchair or bedside chair)?: A Little Help needed to walk in hospital room?: A Little Help needed climbing 3-5 steps with a railing? : A Little 6 Click Score: 20    End of Session Equipment Utilized During Treatment: Oxygen Activity Tolerance: Patient limited by  fatigue;Treatment limited secondary to medical complications (Comment)(hypoxic on 6L O2) Patient left: with call bell/phone within reach;in bed Nurse Communication: Other (comment)(hypoxia; significant dyspnea at rest) PT Visit Diagnosis: Muscle weakness (generalized) (M62.81);Difficulty in walking, not elsewhere classified (R26.2)     Time: 7903-8333 PT Time Calculation (min) (ACUTE ONLY): 17 min  Charges:  $Therapeutic Exercise: 8-22 mins                     Eudelia Hiltunen B. Migdalia Dk PT, DPT Acute Rehabilitation Services Pager [phone] Office [phone]    [place] 09/03/2018, 12:41 PM

## 2018-09-03 NOTE — Progress Notes (Signed)
[facility] Progress Note  Patient: Ruth Douglas FIE:[id number]   PCP: Majel Homer, PA DOB: 1940-07-10   DOA: 08/16/2018   DOS: 09/03/2018   Date of Service: the patient was seen and examined on 09/03/2018  Brief hospital course: Pt. with PMH of type II DM, CKD 3, recurrent UTI, HTN, myelodysplastic syndrome; admitted on 08/21/2018, presented with complaint of abdominal fullness, dysuria, nausea, right upper quadrant pain, was found to have AKI, symptomatic anemia, bilateral lower lobe pneumonia, pyelonephritis.  SARS-CoV-2 negative.  Patient is now getting more hypoxic secondary to volume overload and pneumonia. Currently further plan is continue IV biotics.  Subjective: Awake.  Tolerating high flow nasal cannula he did.  No nausea no vomiting.  No BM today.  Eating okay.  Assessment and Plan: 1. AKI on chronic kidney disease stage III Baseline serum creatinine 1.5-1.75 On presentation 2.67 with elevated BUN and hyponatremia. Currently creatinine around 2, normal sodium normal potassium. Still not back to baseline.   Strict in and out. Urine output is significantly less.  1000 cc in the last 24 hours. Starting her on IV Lasix 60 mg twice daily. We need nephrology consultation if renal function worsens or urine output does not improve.  2. UTI recurrent,  UA nitrite negative Urine culture insignificant growth. Recent urine culture on 08/17/2018 at [facility] grew significant E. coli, resistant to ampicillin and Augmentin. CT renal shows mild left hydronephrosis without stone, diffuse bladder wall thickening and bilateral nonobstructive renal stone. Patient has known history of urological issues on the left ureter with recurrent UTI and follows up with urology. Patient's pain is primarily on the right flank. Blood cultures negative   Initially being treated with IV cefepime. Based on the prior culture transition to [facility].  Given her worsening respiratory distress we will  transition her back to IV antibiotics.  Requires a urology follow-up.  Has seen urology at [facility].  3. Bilateral lower lobe pneumonia. Likely in the setting of aspiration from nausea and vomiting Acute hypoxic respiratory failure-likely ARDS versus TRALI versus pulmonary hemorrhage CT on chest x-ray shows evidence of bilateral lower lobe pneumonia. Currently on 12 L of oxygen does not use any oxygen at home. Blood cultures as above are negative. Incentive spirometry. Wean oxygen to room air. Continue stepdown unit for close monitoring and BiPAP support as needed. VQ scan is negative for any PE. Suspect that this is all pneumonia.  But other etiologies cannot be ruled out. Patient was started on 40 mg daily steroids on 08/31/2018.  Will increase to 60 mg every 8 hours on 09/02/2018. Pulmonary consulted appreciate assistance. Continue pulmonary toilet flutter device and incentive spirometry. Speech therapy recommends n.p.o. for now.  4. Recurrent nausea and vomiting. Currently resolved. Likely in the setting of infection. Tolerating full liquid diet.  Advance to soft diet. CT abdomen scan negative for any acute abnormality. Due to persistent nausea and vomiting repeat CT scan was performed, negative.  Scheduled Reglan ordered.  5.  Acute on chronic diastolic CHF. Essential hypertension Volume overload from aggressive IV resuscitation. Echocardiogram shows preserved EF.  No wall motion or valvular abnormality. Reduce Norvasc from 10 mg to 5 mg.  6.  Myelodysplastic syndrome. Pancytopenia. Symptomatic anemia. Received 2 PRBC. H&H relatively stable. No active bleeding reported. Received Granix after discussion with hematology. Neutropenia improving. Platelets are relatively low but no active bleeding. Earlier provider discussed with hematology at [facility] who will follow-up with the patient on discharge.  7. Type 2 Diabetes Mellitus, uncontroled with Hyperglycemia, renal  complication last hemoglobin A1c was 6.9 this admission hold her home hypoglycemic agents. On insulin sliding scale sensitive with Basal insulin lantus.  8.  Obesity. Body mass index is 30.3 kg/m.   Diet: Encourage fluids DVT Prophylaxis: SCD, pharmacological prophylaxis contraindicated due to Thrombocytopenia  Advance goals of care discussion: Full code  Family Communication: no family was present at bedside, at the time of interview.  Updated family on 09/03/2018.  Disposition:  Discharge to be determined.  Consultants: Hematology PCCM  Procedures: Echocardiogram   Scheduled Meds:  benzonatate  100 mg Oral TID   dextromethorphan  30 mg Oral BID   famotidine  20 mg Oral Daily   furosemide  60 mg Intravenous BID   guaiFENesin  1,200 mg Oral BID   hydrALAZINE  25 mg Oral Q8H   insulin aspart  0-15 Units Subcutaneous TID WC   insulin aspart  0-5 Units Subcutaneous QHS   insulin aspart  3 Units Subcutaneous TID WC   insulin glargine  22 Units Subcutaneous QHS   ipratropium-albuterol  3 mL Nebulization Q6H   methylPREDNISolone (SOLU-MEDROL) injection  60 mg Intravenous Q8H   metoCLOPramide (REGLAN) injection  5 mg Intravenous Q6H   polyethylene glycol  17 g Oral Daily   senna-docusate  2 tablet Oral BID   Continuous Infusions:  ceFEPime (MAXIPIME) IV 2 g (09/03/18 1656)   PRN Meds: acetaminophen **OR** acetaminophen, bisacodyl, chlorpheniramine-HYDROcodone, hydrALAZINE, ondansetron **OR** ondansetron (ZOFRAN) IV Antibiotics: Anti-infectives (From admission, onward)   Start     Dose/Rate Route Frequency Ordered Stop   09/02/18 1800  vancomycin (VANCOCIN) IVPB 750 mg/150 ml premix  Status:  Discontinued     750 mg 150 mL/hr over 60 Minutes Intravenous Every 48 hours 08/31/18 1653 09/02/18 1234   08/31/18 1700  vancomycin (VANCOCIN) 1,500 mg in sodium chloride 0.9 % 500 mL IVPB     1,500 mg 250 mL/hr over 120 Minutes Intravenous  Once 08/31/18 1653 08/31/18  2008   08/31/18 1700  ceFEPIme (MAXIPIME) 2 g in sodium chloride 0.9 % 100 mL IVPB     2 g 200 mL/hr over 30 Minutes Intravenous Every 24 hours 08/31/18 1653     08/31/18 1000  cefdinir (OMNICEF) capsule 300 mg  Status:  Discontinued     300 mg Oral Daily 08/30/18 0951 08/31/18 1636   08/30/18 1000  cefTRIAXone (ROCEPHIN) 2 g in sodium chloride 0.9 % 100 mL IVPB  Status:  Discontinued     2 g 200 mL/hr over 30 Minutes Intravenous Every 24 hours 08/30/18 0925 08/30/18 0946   08/30/18 1000  cefdinir (OMNICEF) capsule 300 mg  Status:  Discontinued     300 mg Oral Every 12 hours 08/30/18 0946 08/30/18 0950   08/29/18 1000  cefdinir (OMNICEF) capsule 300 mg  Status:  Discontinued     300 mg Oral Daily 08/29/18 0749 08/30/18 0925   08/11/2018 1900  ceFEPIme (MAXIPIME) 1 g in sodium chloride 0.9 % 100 mL IVPB  Status:  Discontinued     1 g 200 mL/hr over 30 Minutes Intravenous Every 24 hours 08/21/2018 1825 08/29/18 0749       Objective: Physical Exam: Vitals:   09/03/18 0340 09/03/18 0812 09/03/18 1358 09/03/18 1454  BP: (!) 145/66 (!) 133/50 (!) 146/64   Pulse: 88 90 83 93  Resp: (!) 27 20 16 19   Temp: 98 F (36.7 C)  (!) 97.3 F (36.3 C)   TempSrc: Oral  Oral   SpO2: 97%  94% 90%  Weight: 77.6 kg     Height:        Intake/Output Summary (Last 24 hours) at 09/03/2018 1813 Last data filed at 09/03/2018 1627 Gross per 24 hour  Intake --  Output 1575 ml  Net -1575 ml   Filed Weights   09/01/18 0429 09/02/18 0426 09/03/18 0340  Weight: 80.9 kg 79.4 kg 77.6 kg   General: alert and oriented to time, place, and person. Appear in moderate distress, affect appropriate Eyes: PERRL, Conjunctiva normal ENT: Oral Mucosa Clear, dry  Neck: no JVD, no Abnormal Mass Or lumps Cardiovascular: S1 and S2 Present, no Murmur, peripheral pulses symmetrical Respiratory: increased respiratory effort, Bilateral Air entry equal and Decreased, no use of accessory muscle, bilateral Crackles, no  wheezes Abdomen: Bowel Sound present, Soft and no tenderness, no hernia Skin: no rashes  Extremities: bilateral Pedal edema, no calf tenderness Neurologic: normal without focal findings, mental status, speech normal, alert and oriented x3, PERLA, Motor strength 5/5 and symmetric and sensation grossly normal to light touch Gait not checked due to patient safety concerns  Data Reviewed: CBC: Recent Labs  Lab 08/28/18 0654 08/29/18 0225 08/30/18 0356 08/31/18 0645 09/01/18 0227 09/02/18 0440 09/03/18 0526  WBC 5.6 2.9* 2.2* 3.5* 2.5* 1.8* 1.5*  NEUTROABS 3.7 1.8 1.0*  --   --   --   --   HGB 9.7* 8.8* 8.6* 8.2* 8.2* 7.7* 8.0*  HCT 28.0* 26.0* 25.1* 24.0* 23.5* 22.6* 22.5*  MCV 88.9 89.7 90.3 89.6 89.7 90.0 91.1  PLT 26* 22* 21* 20* 16* 16* 32*   Basic Metabolic Panel: Recent Labs  Lab 08/30/18 0356 08/31/18 0645 09/01/18 0227 09/02/18 0440 09/03/18 0526  NA 136 135 134* 134* 135  K 4.4 3.5 4.0 3.9 4.0  CL 107 103 101 101 100  CO2 20* 20* 20* 22 23  GLUCOSE 149* 60* 151* 123* 169*  BUN 28* 33* 40* 59* 68*  CREATININE 2.39* 2.47* 2.37* 2.33* 2.15*  CALCIUM 8.2* 8.4* 8.4* 8.5* 8.5*  MG 2.2 1.8 1.9 2.1 2.2    Liver Function Tests: Recent Labs  Lab 08/30/18 0356 08/31/18 0645 09/01/18 0227 09/02/18 0440 09/03/18 0526  AST 15 21 24 24 31   ALT 10 11 13 15 15   ALKPHOS 37* 45 43 42 41  BILITOT 0.5 1.1 0.7 0.8 1.8*  PROT 6.0* 6.5 6.6 5.9* 6.0*  ALBUMIN 2.5* 2.7* 2.5* 2.4* 2.2*   No results for input(s): LIPASE, AMYLASE in the last 168 hours. No results for input(s): AMMONIA in the last 168 hours. Coagulation Profile: No results for input(s): INR, PROTIME in the last 168 hours. Cardiac Enzymes: No results for input(s): CKTOTAL, CKMB, CKMBINDEX, TROPONINI in the last 168 hours. BNP (last 3 results) No results for input(s): PROBNP in the last 8760 hours. CBG: Recent Labs  Lab 09/02/18 1609 09/02/18 2034 09/03/18 0756 09/03/18 1153 09/03/18 1625  GLUCAP 98  209* 168* 200* 216*   Studies: Ct Chest Wo Contrast  Result Date: 09/03/2018 CLINICAL DATA:  Pulmonary edema. EXAM: CT CHEST WITHOUT CONTRAST TECHNIQUE: Multidetector CT imaging of the chest was performed following the standard protocol without IV contrast. COMPARISON:  Chest x-ray 09/02/2018 and 08/31/2018 FINDINGS: Cardiovascular: The heart size is normal. Dense atherosclerotic calcifications are present in the coronary arteries. There is no significant effusion. Atherosclerotic changes are noted at the aortic arch. There is no aneurysm. Mediastinum/Nodes: Right paratracheal lymph node measures up to 9 mm. A precarinal node or nodal mass is 11 mm in short axis. Subcarinal  adenopathy is present. Lungs/Pleura: Extensive patchy airspace consolidation favors the upper lobes. There is diffuse ground-glass attenuation as well. Although there is some involvement of the lower lobes, the lung bases are spared. There are no significant effusions. There is no pneumothorax. Upper Abdomen: Visualized abdomen is unremarkable Musculoskeletal: Vertebral body heights and alignment are maintained. No focal lytic or blastic lesions are present. The ribs are unremarkable. Sternum is normal. IMPRESSION: 1. Extensive patchy bilateral airspace opacities favoring the upper lobes. Findings are consistent with multi lobular pneumonia some component of edema may be present. The consolidated segments are not consistent with edema. The pattern is atypical for the novel coronavirus and more likely represents bacterial multifocal pneumonia. 2. Precarinal and subcarinal adenopathy is likely reactive. 3.  Aortic Atherosclerosis (ICD10-I70.0). Electronically Signed   By: San Morelle M.D.   On: 09/03/2018 05:17     Time spent: 35 minutes  Author: Berle Mull, MD [facility] 09/03/2018 6:13 PM  To reach On-call, see care teams to locate the attending and reach out to them via [email or web address]. If 7PM-7AM, please contact  night-coverage If you still have difficulty reaching the attending provider, please page the [facility] (Director on Call) for [facility] on amion for assistance.

## 2018-09-03 NOTE — Evaluation (Signed)
Clinical/Bedside Swallow Evaluation Patient Details  Name: Ruth Douglas MRN: [id number] Date of Birth: [birth year]  Today's Date: 09/03/2018 Time: SLP Start Time (ACUTE ONLY): 1448 SLP Stop Time (ACUTE ONLY): 0859 SLP Time Calculation (min) (ACUTE ONLY): 12 min  Past Medical History:  Past Medical History:  Diagnosis Date  . Blood transfusion without reported diagnosis years ago  . CHF (congestive heart failure) ([place]) years ago   no current cardiologist, pt does not remember having chf  . Diabetes mellitus without complication ([place])   . History of kidney stones    x 2 in past  . Hypertension   . UTI (lower urinary tract infection)    Past Surgical History:  Past Surgical History:  Procedure Laterality Date  . ABDOMINAL HYSTERECTOMY  08/2014   [place]  . ACHILLES TENDON SURGERY Left 06/01/2012   Procedure: PERCUTANEOUS TENDON ACHILLES [place];  Surgeon: Wylene Simmer, MD;  Location: [place];  Service: Orthopedics;  Laterality: Left;  . AMPUTATION Left 06/01/2012   Procedure: LEFT FOOT TRANSMETATARSEL AMPUTATION;  Surgeon: Wylene Simmer, MD;  Location: [place];  Service: Orthopedics;  Laterality: Left;  . AMPUTATION Right 06/20/2014   Procedure: AMPUTATION FOOT;  Surgeon: Newt Minion, MD;  Location: [place];  Service: Orthopedics;  Laterality: Right;  . CESAREAN SECTION    . CYSTOSCOPY W/ URETERAL STENT PLACEMENT Left 08/08/2014   Procedure: CYSTOSCOPY WITH RETROGRADE PYELOGRAM/URETERAL STENT PLACEMENT;  Surgeon: Festus Aloe, MD;  Location: [place];  Service: Urology;  Laterality: Left;  . CYSTOSCOPY WITH RETROGRADE PYELOGRAM, URETEROSCOPY AND STENT PLACEMENT Left 09/05/2014   Procedure: CYSTO, LEFT RETROGRADE PYELOGRAM, LEFT URETEROSCOPY AND STENT PLACEMENT;  Surgeon: Festus Aloe, MD;  Location: [facility];  Service: Urology;  Laterality: Left;  . CYSTOSCOPY WITH STENT PLACEMENT  01/16/2012   Procedure: CYSTOSCOPY WITH STENT PLACEMENT;  Surgeon: Dutch Gray, MD;  Location: [facility];   Service: Urology;  Laterality: Left;  cysto, retrograde , ureteroscopy , stone extraction with laser lithotripsy, stent placement on left, bladder biopsies with fulgaration  . EYE SURGERY Bilateral    lens replacements for cataracts  . HOLMIUM LASER APPLICATION  18/56/3149   Procedure: HOLMIUM LASER APPLICATION;  Surgeon: Dutch Gray, MD;  Location: [facility];  Service: Urology;  Laterality: Left;  . HOLMIUM LASER APPLICATION Left 7/0/2637   Procedure: LEFT HOLMIUM LASER APPLICATION;  Surgeon: Festus Aloe, MD;  Location: [facility];  Service: Urology;  Laterality: Left;  . TOE AMPUTATION     HPI:  Pt is a 78 year old woman admitted with pyelonephritis.  She has had N/V, now resolved. Hospital course complicated by development of bilateral airspace disease and persistent severe hypoxemia of unclear etiology. PMH: DM2, CKD, recurrent UTI, HTN, MDS   Assessment / Plan / Recommendation Clinical Impression  Pt has clinical signs concerning for dysphagia and possible aspiration, including coughing that immediately follows thin liquids boluses and delayed coughing with purees. She reports that she has had increased coughing during meals while in the hospital, but denies any h/o dysphagia. It is possible that with her current respiratory status, requiring HFNC and still with SpO2 in the low 90s (lowest 84% prior to initiating PO intake), she is having difficulty coordinating respirations and airway protection for swallowing. Recommend NPO except for ice chips after oral care and essential meds in puree pending completion of MBS - reached out to MD regarding recommendations. Per RN, unsure if pt can transport to radiology with current respiratory needs. Will f/u for readiness to complete MBS prior to  making any diet recommendations. SLP Visit Diagnosis: Dysphagia, unspecified (R13.10)    Aspiration Risk  Moderate aspiration risk    Diet Recommendation NPO except meds;Ice chips PRN after oral care    Medication Administration: Crushed with puree    Other  Recommendations Oral Care Recommendations: Oral care QID;Oral care prior to ice chip/H20   Follow up Recommendations (tba)      Frequency and Duration min 2x/week  2 weeks       Prognosis Prognosis for Safe Diet Advancement: Good      Swallow Study   General HPI: Pt is a 78 year old woman admitted with pyelonephritis.  She has had N/V, now resolved. Hospital course complicated by development of bilateral airspace disease and persistent severe hypoxemia of unclear etiology. PMH: DM2, CKD, recurrent UTI, HTN, MDS Type of Study: Bedside Swallow Evaluation Previous Swallow Assessment: none in chart Diet Prior to this Study: Dysphagia 3 (soft);Thin liquids Temperature Spikes Noted: No Respiratory Status: Nasal cannula(HFNC) History of Recent Intubation: No Behavior/Cognition: Alert;Cooperative Oral Cavity Assessment: Dry Oral Care Completed by SLP: No Oral Cavity - Dentition: Edentulous;Dentures, not available Vision: Functional for self-feeding Self-Feeding Abilities: Able to feed self Patient Positioning: Upright in bed Baseline Vocal Quality: Normal Volitional Swallow: Able to elicit    Oral/Motor/Sensory Function Overall Oral Motor/Sensory Function: Generalized oral weakness   Ice Chips Ice chips: Within functional limits Presentation: Spoon   Thin Liquid Thin Liquid: Impaired Presentation: Cup;Self Fed Pharyngeal  Phase Impairments: Cough - Immediate    Nectar Thick Nectar Thick Liquid: Not tested   Honey Thick Honey Thick Liquid: Not tested   Puree Puree: Impaired Presentation: Self Fed;Spoon Pharyngeal Phase Impairments: Cough - Delayed   Solid     Solid: Not tested      Venita Sheffield Marcianna Daily 09/03/2018,9:13 AM  Pollyann Glen, M.A. [place] Acute Environmental education officer [phone] Office [phone]

## 2018-09-03 NOTE — Progress Notes (Signed)
Patient refused BIPAP for tonight. Patient is currently on 35L/100% HFNC with stable vital signs. RT informed patient to have RT called if she changes her mind. RT will continue to monitor as needed.

## 2018-09-03 NOTE — Consult Note (Signed)
Consultation Note Date: 09/03/2018   Patient Name: Ruth Douglas  DOB: [birth year]  MRN: [id number]  Age / Sex: 78 y.o., female  PCP: Majel Homer, [place] Referring Physician: Lavina Hamman, MD  Reason for Consultation: Establishing goals of care  HPI/Patient Profile: 78 y.o. 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 She is pancytopenic with WBC at 1.5 (s/p Granix), Hgb 8.0, and Plts 32. Admission has been complicated by renal decompensation and volume overload. Palliative medicine consulted for [place].   Clinical Assessment and Goals of Care:  I have reviewed medical records including EPIC notes, labs and imaging, received, assessed the patient and then met at the bedside to discuss diagnosis prognosis, GOC, EOL wishes, disposition and options.  I introduced Palliative Medicine as specialized medical care for people living with serious illness. It focuses on providing relief from the symptoms and stress of a serious illness. The goal is to improve quality of life for both the patient and the family.  We discussed a brief life review of the patient. She is retired from working in [employer] in [place]. She lives at home with her son. She has another son who lives close by.  As far as functional and nutritional status prior to this admission she reports good appetite. She was ambulatory and independent with all ADLs. She enjoyed "going to the stores" with her son and was able to ambulate in the stores without assistance- even after her discharge  from the hospital in May.  We discussed her current illness and what it means in the larger context of her on-going co-morbidities.  Natural disease trajectory and expectations at EOL were discussed. She says she has hopes to "go to heaven". We discussed her current co-morbidities and how they are working against each other- ie- renal failure contributes to volume overload which increases SOB- as well as MDS complicates infection resolution, additionally discussed recent finding of dysphagia which increases risk for recurrent pneumonia.  I attempted to elicit values and goals of care important to the patient. Being with her family- her sons and granddaughters is most important.   The difference between aggressive medical intervention and comfort care was discussed in light of the patient's goals of care. Ruth Douglas says she does sometimes think "just going on to God would be the best thing for me". She worries about her son who needs her, and she wants to be here for her Granddaughter who just started college.   Advanced directives, concepts specific to code status, artifical feeding and hydration, and rehospitalization were considered and discussed. She does not want to be intubated or resuscitated. She does request DNR status. She would consider artificial feeding.   I called her  son in an attempt to update him on my conversation with his mother. Left message requesting a return call.  Questions and concerns were addressed. The family was encouraged to call with questions or concerns.   Primary Decision Maker PATIENT    SUMMARY OF RECOMMENDATIONS -DNR -Continue current care with [place] - treat what is treatable- if she declines further she would consider comfort care- she does not desire to be intubated- she would prefer comfort care over intubation if her respiratory status worsened -PMT will continue to follow and visit for support    Code Status/Advance Care Planning:  DNR  Discharge  Planning: To Be Determined  Primary Diagnoses: Present on Admission: . Pancytopenia ([place]) . Essential hypertension . 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 RUQ pain . Nausea . Dysuria . HCAP (healthcare-associated pneumonia) . SIRS (systemic inflammatory response syndrome) (HCC) . Acute renal failure superimposed on stage 3 chronic kidney disease ([place])   I have reviewed the medical record, interviewed the patient and family, and examined the patient. The following aspects are pertinent.  Past Medical History:  Diagnosis Date  . Blood transfusion without reported diagnosis years ago  . CHF (congestive heart failure) ([place]) years ago   no current cardiologist, pt does not remember having chf  . Diabetes mellitus without complication ([place])   . History of kidney stones    x 2 in past  . Hypertension   . UTI (lower urinary tract infection)    Social History   Socioeconomic History  . Marital status: Widowed    Spouse name: Not on file  . Number of children: Not on file  . Years of education: Not on file  . Highest education level: Not on file  Occupational History  . Not on file  Social Needs  . Financial resource strain: Not on file  . Food insecurity    Worry: Not on file    Inability: Not on file  . Transportation needs    Medical: Not on file    Non-medical: Not on file  Tobacco Use  . Smoking status: Never Smoker  . Smokeless tobacco: Never Used  Substance and Sexual Activity  . Alcohol use: No    Alcohol/week: 0.0 standard drinks  . Drug use: No  . Sexual activity: Not on file    Comment: husband passed 2016  Lifestyle  . Physical activity    Days per week: Not on file    Minutes per session: Not on file  . Stress: Not on file  Relationships  . Social Herbalist on phone: Not on file    Gets together: Not on file    Attends religious service: Not on file    Active member of club or organization: Not on file    Attends meetings of clubs or  organizations: Not on file    Relationship status: Not on file  Other Topics Concern  . Not on file  Social History Narrative  . Not on file   Family History  Problem Relation Age of Onset  . Diabetes Mellitus II Mother   . Diabetes Mellitus II Father   . Lung cancer Sister   . Kidney cancer Brother    Scheduled Meds: . benzonatate  100 mg Oral TID  . dextromethorphan  30 mg Oral BID  . famotidine  20 mg Oral Daily  . furosemide  60 mg Intravenous BID  . guaiFENesin  1,200 mg Oral BID  . hydrALAZINE  25 mg Oral Q8H  .  insulin aspart  0-15 Units Subcutaneous TID WC  . insulin aspart  0-5 Units Subcutaneous QHS  . insulin aspart  3 Units Subcutaneous TID WC  . insulin glargine  22 Units Subcutaneous QHS  . ipratropium-albuterol  3 mL Nebulization Q6H  . methylPREDNISolone (SOLU-MEDROL) injection  60 mg Intravenous Q8H  . metoCLOPramide (REGLAN) injection  5 mg Intravenous Q6H  . polyethylene glycol  17 g Oral Daily  . senna-docusate  2 tablet Oral BID   Continuous Infusions: . ceFEPime (MAXIPIME) IV 2 g (09/03/18 1656)   PRN Meds:.acetaminophen **OR** acetaminophen, bisacodyl, chlorpheniramine-HYDROcodone, hydrALAZINE, ondansetron **OR** ondansetron (ZOFRAN) IV Medications Prior to Admission:  Prior to Admission medications   Medication Sig Start Date End Date Taking? Authorizing Provider  amLODipine (NORVASC) 10 MG tablet Take 10 mg by mouth every morning.  08/13/18  Yes [provider]  bisacodyl (DULCOLAX) 10 MG suppository Place 10 mg rectally daily as needed (constipation).   Yes [provider]  Cyanocobalamin (VITAMIN B-12 PO) Take 1 tablet by mouth every morning.   Yes [provider]  Ferrous Sulfate (IRON PO) Take 1 tablet by mouth every morning.   Yes [provider]  TRESIBA FLEXTOUCH 100 UNIT/ML SOPN FlexTouch Pen INJECT 18 UNITS INTO THE SKIN AT BEDTIME. Patient taking differently: Inject 22 Units into the skin at bedtime.   02/15/16  Yes Dettinger, Fransisca Kaufmann, MD  ciprofloxacin (CIPRO) 500 MG tablet Take 500 mg by mouth daily. 08/20/18   [provider]   Allergies  Allergen Reactions  . Phenergan [Promethazine Hcl] Other (See Comments)    "causes me to be out of my head"    Review of Systems  Physical Exam  Vital Signs: BP (!) 146/64   Pulse 93   Temp (!) 97.3 F (36.3 C) (Oral)   Resp 19   Ht '5\' 3"'  (1.6 m)   Wt 77.6 kg   SpO2 90%   BMI 30.30 kg/m  Pain Scale: 0-10 POSS *See Group Information*: 1-Acceptable,Awake and alert Pain Score: 0-No pain   SpO2: SpO2: 90 % O2 Device:SpO2: 90 % O2 Flow Rate: .O2 Flow Rate (L/min): 35 L/min  IO: Intake/output summary:   Intake/Output Summary (Last 24 hours) at 09/03/2018 1756 Last data filed at 09/03/2018 1627 [place] per 24 hour  Intake -  Output 1575 ml  Net -1575 ml    LBM: Last BM Date: 08/31/18 Baseline Weight: Weight: 75.5 kg Most recent weight: Weight: 77.6 kg     Palliative Assessment/Data: PPS: 50%     Thank you for this consult. Palliative medicine will continue to follow and assist as needed.   Time In:1630 Time Out: 1800 Time Total: 90 minutes Prolonged services billed: yes Greater than 50%  of this time was spent counseling and coordinating care related to the above assessment and plan.  Signed by: Mariana Kaufman, AGNP-C Palliative Medicine    Please contact Palliative Medicine Team phone at [phone] for questions and concerns.  For individual provider: See Shea Evans

## 2018-09-03 NOTE — Progress Notes (Signed)
NAME:  Ruth Douglas, MRN:  [id number], DOB:  [birth year], LOS: 8 ADMISSION DATE:  08/28/2018, CONSULTATION DATE:  09/02/18 REFERRING MD:  Posey Pronto, CHIEF COMPLAINT:  SOB   Brief History   78 year old woman w/ hx of DM2, CKD, recurrent UTI, HTN, MDS p/w pyelonephritis.  Hospital course complicated by development of bilateral airspace disease and persistent severe hypoxemia of unclear etiology.  History of present illness   Patient admitted on 07/30/2018 for abd pain, dysuria, N, RUQ pain found to have pyelonephritis.  Also had some new oxygen requirement with a question of aspiration.  She has been on strong antibiotics since admission but hypoxemia has progressed to point where she needs BIPAP more often during the day and not.  Has been placed empirically on steroids and trial of diuresis as well with results pending.  Due to this persistent airspace disease pulmonology is consulted.  Patient is a nonsmoker, has no complaints of cough but does have orthopnea, PND and DOE.  No chest pains. No history of heart failure.  Notable labs include acute on chronic anemia as well as worsening leuokopenia.  She denies aspiration symptoms.   Past Medical History  DM2 CKD3 Recurrent UTI HTN MDS: "Hematology performed a bone marrow biopsy on her on 07/13/2018 which showed hypercellular marrow (70%) with trilineage hematopoiesis, megakaryocytic dyspoiesis and increased blasts (approximately 5 to 7%). Findings are concerning for involvement by a myeloid neoplasm (e.g. myelodysplastic syndrome with excess blasts 1). However, reactive/benign causes of cytopenias must be excluded clinically (e.g. vitamin/mineral deficiencies, toxins, autoimmune processes, etc). FISH detected presence of both del(5q) and tri(8). 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  Hematology recommended for her to begin treatment for MDS, but the patient wanted to discuss this  further with her son before deciding.  The patient states that she notified her hematologist that she wished to follow-up closer to home in [place], [place].  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  Significant Hospital Events   6/28 Admitted  Consults:  Hematology PCCM  Procedures:  NA  Significant Diagnostic Tests:  CT A/P 6/28 > small R effusion with atx, extensive left basilar consolidation.  Left hydronephrosis has resolved.  Mild anasarca. CT chest 7/6 > extensive patchy bilateral airspace opacities, mild edema. Procal 2 (7/3)>>1.5 (7/5)  Micro Data:  Blood/urine cultures unrevealing  Antimicrobials:  Cefepime 6/28-6/30, 7/3>>> Vanc 7/3 x 1 Cefdinir 7/1-7/3   Interim history/subjective:  Continues on HFNC at [place].  SpO2 95%. Has dry cough but does state breathing has definitely improved.  Objective   Blood pressure (!) 133/50, pulse 90, temperature 98 F (36.7 C), temperature source Oral, resp. rate 20, height '5\' 3"'  (1.6 m), weight 77.6 kg, SpO2 97 %.    FiO2 (%):  [60 %-100 %] 100 %   Intake/Output Summary (Last 24 hours) at 09/03/2018 0923 Last data filed at 09/03/2018 7017 [place] per 24 hour  Intake 200 ml  Output 1550 ml  Net -1350 ml   Filed Weights   09/01/18 0429 09/02/18 0426 09/03/18 0340  Weight: 80.9 kg 79.4 kg 77.6 kg    Examination: GEN: chronically ill appearing female, in NAD HEENT: [place]/AT.  HFNC in place @ 30L. CV: RRR, ext warm PULM: Crackles bilaterally. GI: Soft, +BS EXT: Trace edema, BL TMA noted. NEURO: A&O x 3, no deficits. SKIN: warm, dry.  Assessment & Plan:   Acute hypoxemic respiratory failure in settings of (A) bone marrow failure with immunosuppression and thrombocytopenia, (B) presumed fluid rescuscitation for sepsis, (C) blood product administration. Differential remains broad and includes DAH, fluid overload, opportunistic  infection, TRALI, TACO, recurrent aspiration, inflammatory pneumonitis, and ARDS.  Presumed primarily HCAP + acute pulmonary edema + atelectasis. - Continue diuresis for goal negative balance (currently +9L). - Continue empiric abx. - Continue steroids. - Push IS / bronchial hygiene (I stressed this while in room and as she used it, SpO2 increased to 99%). - If no improvement, would get palliative care involved given that MDS carries an abysmal prognosis by itself.   Rest per primary team.   Montey Hora, PA - [facility] Pager: [phone].  If no answer, [phone] 09/03/2018, 9:31 AM

## 2018-09-04 ENCOUNTER — Inpatient Hospital Stay (HOSPITAL_COMMUNITY): Payer: Medicare HMO

## 2018-09-04 ENCOUNTER — Ambulatory Visit (HOSPITAL_COMMUNITY): Payer: Medicare HMO | Admitting: Hematology

## 2018-09-04 DIAGNOSIS — Z515 Encounter for palliative care: Secondary | ICD-10-CM

## 2018-09-04 DIAGNOSIS — R0602 Shortness of breath: Secondary | ICD-10-CM

## 2018-09-04 LAB — CBC
HCT: 21.5 % — ABNORMAL LOW (ref 36.0–46.0)
Hemoglobin: 7.6 g/dL — ABNORMAL LOW (ref 12.0–15.0)
MCH: 30.5 pg (ref 26.0–34.0)
MCHC: 35.3 g/dL (ref 30.0–36.0)
MCV: 86.3 fL (ref 80.0–100.0)
Platelets: 12 10*3/uL — CL (ref 150–400)
RBC: 2.49 MIL/uL — ABNORMAL LOW (ref 3.87–5.11)
RDW: 14.3 % (ref 11.5–15.5)
WBC: 1.4 10*3/uL — CL (ref 4.0–10.5)
nRBC: 1.5 % — ABNORMAL HIGH (ref 0.0–0.2)

## 2018-09-04 LAB — COMPREHENSIVE METABOLIC PANEL
ALT: 15 U/L (ref 0–44)
AST: 25 U/L (ref 15–41)
Albumin: 2.3 g/dL — ABNORMAL LOW (ref 3.5–5.0)
Alkaline Phosphatase: 41 U/L (ref 38–126)
Anion gap: 16 — ABNORMAL HIGH (ref 5–15)
BUN: 85 mg/dL — ABNORMAL HIGH (ref 8–23)
CO2: 27 mmol/L (ref 22–32)
Calcium: 8.3 mg/dL — ABNORMAL LOW (ref 8.9–10.3)
Chloride: 95 mmol/L — ABNORMAL LOW (ref 98–111)
Creatinine, Ser: 2.16 mg/dL — ABNORMAL HIGH (ref 0.44–1.00)
GFR calc Af Amer: 25 mL/min — ABNORMAL LOW (ref >60)
GFR calc non Af Amer: 22 mL/min — ABNORMAL LOW (ref >60)
Glucose, Bld: 192 mg/dL — ABNORMAL HIGH (ref 70–99)
Potassium: 2.9 mmol/L — ABNORMAL LOW (ref 3.5–5.1)
Sodium: 138 mmol/L (ref 135–145)
Total Bilirubin: 0.9 mg/dL (ref 0.3–1.2)
Total Protein: 6.6 g/dL (ref 6.5–8.1)

## 2018-09-04 LAB — GLUCOSE, CAPILLARY
Glucose-Capillary: 190 mg/dL — ABNORMAL HIGH (ref 70–99)
Glucose-Capillary: 200 mg/dL — ABNORMAL HIGH (ref 70–99)
Glucose-Capillary: 274 mg/dL — ABNORMAL HIGH (ref 70–99)
Glucose-Capillary: 241 mg/dL — ABNORMAL HIGH (ref 70–99)

## 2018-09-04 LAB — BASIC METABOLIC PANEL
Anion gap: 15 (ref 5–15)
BUN: 86 mg/dL — ABNORMAL HIGH (ref 8–23)
CO2: 28 mmol/L (ref 22–32)
Calcium: 8.4 mg/dL — ABNORMAL LOW (ref 8.9–10.3)
Chloride: 96 mmol/L — ABNORMAL LOW (ref 98–111)
Creatinine, Ser: 2.03 mg/dL — ABNORMAL HIGH (ref 0.44–1.00)
GFR calc Af Amer: 27 mL/min — ABNORMAL LOW (ref >60)
GFR calc non Af Amer: 23 mL/min — ABNORMAL LOW (ref >60)
Glucose, Bld: 217 mg/dL — ABNORMAL HIGH (ref 70–99)
Potassium: 3.1 mmol/L — ABNORMAL LOW (ref 3.5–5.1)
Sodium: 139 mmol/L (ref 135–145)

## 2018-09-04 LAB — MAGNESIUM
Magnesium: 2.1 mg/dL (ref 1.7–2.4)
Magnesium: 2.2 mg/dL (ref 1.7–2.4)

## 2018-09-04 MED ORDER — HYDROMORPHONE HCL 1 MG/ML IJ SOLN
0.2500 mg | INTRAMUSCULAR | Status: DC | PRN
  Administered 2018-09-04 – 2018-09-05 (×4): 0.25 mg via INTRAVENOUS
  Filled 2018-09-04 (×4): qty 1

## 2018-09-04 MED ORDER — POTASSIUM CHLORIDE CRYS ER 20 MEQ PO TBCR: 20.0000 meq | EXTENDED_RELEASE_TABLET | Freq: Two times a day (BID) | ORAL | Status: DC

## 2018-09-04 MED ORDER — METOCLOPRAMIDE HCL 5 MG/ML IJ SOLN
5.0000 mg | Freq: Three times a day (TID) | INTRAMUSCULAR | Status: DC
  Administered 2018-09-04 – 2018-09-05 (×4): 5 mg via INTRAVENOUS
  Filled 2018-09-04 (×4): qty 2

## 2018-09-04 MED ORDER — SALINE SPRAY 0.65 % NA SOLN
1.0000 | NASAL | Status: DC | PRN
  Filled 2018-09-04: qty 44

## 2018-09-04 MED ORDER — METHOCARBAMOL 1000 MG/10ML IJ SOLN
500.0000 mg | Freq: Three times a day (TID) | INTRAVENOUS | Status: DC | PRN
  Filled 2018-09-04: qty 5

## 2018-09-04 MED ORDER — POTASSIUM CHLORIDE 10 MEQ/100ML IV SOLN
10.0000 meq | INTRAVENOUS | Status: DC
  Administered 2018-09-04 (×4): 10 meq via INTRAVENOUS
  Filled 2018-09-04 (×2): qty 100

## 2018-09-04 MED ORDER — POTASSIUM CHLORIDE CRYS ER 20 MEQ PO TBCR: 40.0000 meq | EXTENDED_RELEASE_TABLET | Freq: Once | ORAL | Status: DC

## 2018-09-04 MED ORDER — POTASSIUM CHLORIDE 10 MEQ/100ML IV SOLN
INTRAVENOUS | Status: AC
  Filled 2018-09-04: qty 100

## 2018-09-04 MED ORDER — POTASSIUM CHLORIDE 10 MEQ/100ML IV SOLN
10.0000 meq | INTRAVENOUS | Status: AC
  Administered 2018-09-04 (×4): 10 meq via INTRAVENOUS
  Filled 2018-09-04 (×4): qty 100

## 2018-09-04 MED ORDER — METHYLPREDNISOLONE SODIUM SUCC 40 MG IJ SOLR
40.0000 mg | Freq: Two times a day (BID) | INTRAMUSCULAR | Status: DC
  Administered 2018-09-04 – 2018-09-05 (×2): 40 mg via INTRAVENOUS
  Filled 2018-09-04 (×2): qty 1

## 2018-09-04 MED ORDER — FENTANYL CITRATE (PF) 100 MCG/2ML IJ SOLN: 12.5000 ug | INTRAMUSCULAR | Status: DC | PRN

## 2018-09-04 NOTE — Plan of Care (Signed)
  Problem: Coping: Goal: Level of anxiety will decrease Outcome: Progressing Note: No s/s of anxiety noted.   Problem: Elimination: Goal: Will not experience complications related to urinary retention Outcome: Progressing Note: Adequate UOP from external catheter.  No s/s of urinary retention.

## 2018-09-04 NOTE — Progress Notes (Signed)
Lamar Blinks, NP notified of the following abnormal labs: K+ 2.9, Hgb 7.6, WBC 1.4, Plts 12.  Orders received for IV KCl.  Jodell Cipro

## 2018-09-04 NOTE — Progress Notes (Signed)
Daily Progress Note   Patient Name: Ruth Douglas       Date: 09/04/2018 DOB: 01-02-1941  Age: 78 y.o. MRN#: [id number] Attending Physician: Ruth Hamman, MD Primary Care Physician: Ruth Douglas, [place] Admit Date: 08/02/2018  Reason for Consultation/Follow-up: Establishing goals of care  Subjective: Spoke with son Ruth Douglas- relayed my conversation with patient yesterday. He states he is in agreement. He notes that patient had chosen not to treat her MDS. We discussed that with her low WBC it is very difficult for her to overcome infections. He notes she has had constant UTI's. l discussed with him my concern that she is not going to be able to get better from this hospitalization. He stated he had the same concerns and understood.  Visited with patient. She was sitting up in chair. Tells me she feels miserable and she is tired.  We discussed worries that her MDS is limiting her bodies ability to recover from pneumonia.  We discussed that there continues to be the options for transition to more comfort focused care. She would like some opioid medication for comfort in addition to continued curative focused care.  She has a dry mouth due to not being able to eat or drink. She asks about a feeding tube- we discussed that while a feeding tube would provide her nutrition- it would not cure her MDS or her pneumonia. She complains of feeling SOB, tight lungs. Feels like her nose is "stopped up".  She would like to have her son, Ruth Douglas, come in tomorrow and have discussion for [place].   ROS  Length of Stay: 9  Current Medications: Scheduled Meds:  . furosemide  60 mg Intravenous BID  . insulin aspart  0-15 Units Subcutaneous TID WC  . insulin aspart  0-5 Units Subcutaneous QHS  . insulin glargine  22  Units Subcutaneous QHS  . ipratropium-albuterol  3 mL Nebulization Q6H  . methylPREDNISolone (SOLU-MEDROL) injection  40 mg Intravenous Q12H  . metoCLOPramide (REGLAN) injection  5 mg Intravenous TID    Continuous Infusions: . ceFEPime (MAXIPIME) IV 2 g (09/03/18 1656)  . methocarbamol (ROBAXIN) IV      PRN Meds: acetaminophen **OR** acetaminophen, bisacodyl, hydrALAZINE, HYDROmorphone (DILAUDID) injection, methocarbamol (ROBAXIN) IV, ondansetron **OR** ondansetron (ZOFRAN) IV, sodium chloride  Physical Exam Vitals signs and nursing note reviewed.  Constitutional:      Appearance: She is ill-appearing.  Cardiovascular:     Rate and Rhythm: Tachycardia present.  Pulmonary:     Comments: Increased effort Skin:    Coloration: Skin is pale.  Neurological:     Mental Status: She is alert and oriented to person, place, and time.  Psychiatric:        Mood and Affect: Mood normal.        Thought Content: Thought content normal.             Vital Signs: BP (!) 160/62   Pulse 95   Temp (!) 97.3 F (36.3 C)   Resp 20   Ht 5\' 3"  (1.6 m)   Wt 73.1 kg   SpO2 95%   BMI 28.55 kg/m  SpO2: SpO2: 95 % O2 Device: O2 Device: High Flow Nasal Cannula O2 Flow Rate: O2 Flow Rate (L/min): 30 L/min  Intake/output summary:   Intake/Output Summary (Last 24 hours) at 09/04/2018 1412 Last data filed at 09/04/2018 1102 [place] per 24 hour  Intake 120 ml  Output 3875 ml  Net -3755 ml   LBM: Last BM Date: (patient thinks 3 or 4 days ago, isn't sure) Baseline Weight: Weight: 75.5 kg Most recent weight: Weight: 73.1 kg       Palliative Assessment/Data: PPS: 10%      Patient Active Problem List   Diagnosis Date Noted  . Palliative care by specialist   . Advanced care planning/counseling discussion   . Goals of care, counseling/discussion   . HCAP (healthcare-associated pneumonia) 08/28/2018  . SIRS (systemic inflammatory response syndrome) ([place]) 08/28/2018  . Acute renal failure  superimposed on stage 3 chronic kidney disease ([place]) 08/28/2018  . Diabetes mellitus with hyperglycemia, with long-term current use of insulin ([place]) 08/28/2018  . Pancytopenia ([place]) 08/16/2018  . 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 RUQ pain 08/04/2018  . Nausea 08/13/2018  . Dysuria 08/02/2018  . Nephrolithiasis 08/08/2014  . Essential hypertension 06/19/2014  . Anemia 06/18/2014  . CKD (chronic kidney disease) 05/31/2012  . DVT (deep venous thrombosis) ([place]) 05/31/2012  . Hyperlipidemia 05/31/2012  . DM2 (diabetes mellitus, type 2) ([place]) 01/15/2012  . Diabetic foot ulcer ([place]) 01/15/2012    Palliative Care Assessment & Plan   Patient Profile: 78 y.o. 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 She is pancytopenic with WBC at 1.5 (s/p Granix), Hgb 8.0, and Plts 32. Admission has been complicated by renal decompensation and volume overload. Palliative medicine consulted for Ruth Douglas.   Assessment/Recommendations/Plan   Hydromorphone .25mg  q2hr prn pain or SOB  Saline spray prn bilateral nares  Meeting scheduled with patient's son tomorrow at [place] and Additional Recommendations:  Limitations on Scope of Treatment: Full Scope Treatment with limit set at DNR  Code Status:  DNR  Prognosis:   Unable to determine- very poor- CT scan showed progressing pneumonia, SLP eval indicates likely continuous aspiration, advanced MDS, leukopenia making recovery difficult  Discharge Planning:  To Be Determined  Care plan was discussed with patient, her sonUlice Douglas, and Dr. Posey Pronto.  Thank you for allowing the  Palliative Medicine Team to assist in the care  of this patient.   Time In: 1230 Time Out: 1330 Total Time 60 mins Prolonged Time Billed yes      Greater than 50%  of this time was spent counseling and coordinating care related to the above assessment and plan.  Mariana Kaufman, AGNP-C Palliative Medicine   Please contact Palliative Medicine Team phone at [phone] for questions and concerns.

## 2018-09-04 NOTE — Progress Notes (Signed)
SLP Cancellation Note  Patient Details Name: Ruth Douglas MRN: [id number] DOB: 10/21/1940   Cancelled treatment:       Reason Eval/Treat Not Completed: Medical issues which prohibited therapy. RN discussed MBS with RT and they do not believe pt is appropriate to travel to radiology for the procedure. Will f/u for readiness.    Venita Sheffield Aveyah Greenwood 09/04/2018, 9:48 AM  Pollyann Glen, M.A. [place] Acute Environmental education officer [phone] Office [phone]

## 2018-09-04 NOTE — Progress Notes (Signed)
NAME:  Ruth Douglas, MRN:  [id number], DOB:  November 07, 1940, LOS: 9 ADMISSION DATE:  08/06/2018, CONSULTATION DATE:  09/02/18 REFERRING MD:  Posey Pronto, CHIEF COMPLAINT:  SOB   Brief History   78 year old woman w/ hx of DM2, CKD, recurrent UTI, HTN, MDS p/w pyelonephritis.  Hospital course complicated by development of bilateral airspace disease and persistent severe hypoxemia of unclear etiology.  History of present illness   Patient admitted on 08/24/2018 for abd pain, dysuria, N, RUQ pain found to have pyelonephritis.  Also had some new oxygen requirement with a question of aspiration.  She has been on strong antibiotics since admission but hypoxemia has progressed to point where she needs BIPAP more often during the day and not.  Has been placed empirically on steroids and trial of diuresis as well with results pending.  Due to this persistent airspace disease pulmonology is consulted.  Patient is a nonsmoker, has no complaints of cough but does have orthopnea, PND and DOE.  No chest pains. No history of heart failure.  Notable labs include acute on chronic anemia as well as worsening leuokopenia.  She denies aspiration symptoms.   Past Medical History  DM2 CKD3 Recurrent UTI HTN MDS: "Hematology performed a bone marrow biopsy on her on 07/13/2018 which showed hypercellular marrow (70%) with trilineage hematopoiesis, megakaryocytic dyspoiesis and increased blasts (approximately 5 to 7%). Findings are concerning for involvement by a myeloid neoplasm (e.g. myelodysplastic syndrome with excess blasts 1). However, reactive/benign causes of cytopenias must be excluded clinically (e.g. vitamin/mineral deficiencies, toxins, autoimmune processes, etc). FISH detected presence of both del(5q) and tri(8). 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  Hematology recommended for her to begin treatment for MDS, but the patient wanted to discuss this  further with her son before deciding.  The patient states that she notified her hematologist that she wished to follow-up closer to home in [place], [place].  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  Significant Hospital Events   6/28 Admitted 7/6 Palliative consult - DNR  Consults:  Hematology PCCM  Procedures:  NA  Significant Diagnostic Tests:  CT A/P 6/28 > small R effusion with atx, extensive left basilar consolidation.  Left hydronephrosis has resolved.  Mild anasarca. CT chest 7/6 > extensive patchy bilateral airspace opacities, mild edema. Procal 2 (7/3)>>1.5 (7/5)  Micro Data:  Blood/urine cultures unrevealing  Antimicrobials:  Cefepime 6/28-6/30, 7/3 > Vanc 7/3 x 1 Cefdinir 7/1-7/3   Interim history/subjective:  Continues on HFNC though feels that she is getting improved.  Objective   Blood pressure (!) 157/58, pulse 89, temperature 98 F (36.7 C), temperature source Oral, resp. rate (!) 22, height '5\' 3"'  (1.6 m), weight 73.1 kg, SpO2 91 %.    FiO2 (%):  [100 %] 100 %   Intake/Output Summary (Last 24 hours) at 09/04/2018 0959 Last data filed at 09/04/2018 0500 Gross per 24 hour  Intake 120 ml  Output 3025 ml  Net -2905 ml   Filed Weights   09/02/18 0426 09/03/18 0340 09/04/18 0448  Weight: 79.4 kg 77.6 kg 73.1 kg    Examination: GEN: chronically ill appearing female, resting comfortably, in NAD. HEENT: [place]/AT.  HFNC in place @ 32L. CV: RRR, ext warm. PULM: Faint crackles bilaterally. GI: Soft, +BS. EXT: Trace edema, BL TMA noted. NEURO: A&O x 3, no deficits. SKIN: warm, dry.  Assessment & Plan:   Acute hypoxemic respiratory failure in settings of (A) bone marrow failure with immunosuppression and thrombocytopenia, (B) presumed fluid rescuscitation for sepsis, (C) blood product administration. Differential remains broad and includes DAH, fluid overload,  opportunistic infection, TRALI, TACO, recurrent aspiration, inflammatory pneumonitis, and ARDS.  Presumed primarily HCAP + acute pulmonary edema + atelectasis. - Continue diuresis for goal negative balance. - Continue empiric abx. - Continue steroids. - Push IS / bronchial hygiene (I stressed this again while in room). - MBS when able. - Palliative care consult appreciated - if no improvement, DNR / DNI noted.  Hypokalemia. - Additional 82mq K PO now. - Follow BMP.  Rest per primary team.   RMontey Hora PA - [facility] Pager: [phone]  If no answer, [phone] - [phone]/08/2018, 9:59 AM

## 2018-09-04 NOTE — Progress Notes (Signed)
[facility] Progress Note  Patient: Ruth Douglas XKG:[id number]   PCP: Majel Homer, PA DOB: [birth year]   DOA: 08/27/2018   DOS: 09/04/2018   Date of Service: the patient was seen and examined on 09/04/2018  Brief hospital course: Pt. with PMH of type II DM, CKD 3, recurrent UTI, HTN, myelodysplastic syndrome; admitted on 08/13/2018, presented with complaint of abdominal fullness, dysuria, nausea, right upper quadrant pain, was found to have AKI, symptomatic anemia, bilateral lower lobe pneumonia, pyelonephritis.  SARS-CoV-2 negative.  Patient is now getting more hypoxic secondary to volume overload and pneumonia. Currently further plan is continue steroids and IV diuresis.  Family meeting tomorrow on 28-Sep-2018 for goals of care discussion with palliative care.  Subjective: Awake.  No nausea no vomiting.  No chest pain.  Appears more tired and reports fatigue and increasing shortness of breath.  Also has cough.  Assessment and Plan: 1.healthcare associated as well as aspiration Bilateral lower lobe pneumonia  Acute hypoxic respiratory failure likely ARDS versus TRALI Aspiration likely in the setting of aspiration from nausea and vomiting CT shows evidence of bilateral lower lobe pneumonia. Does not use any oxygen at home. Currently on high flow nasal cannula heated with 70% FiO2. Blood cultures are negative. VQ scan is negative for any PE. Pulmonary consulted appreciate assistance. Speech therapy recommends n.p.o. for now.  High risk for aspiration.  Unable to perform MBS secondary to oxygen requirement.  Incentive spirometry. Continue stepdown unit for close monitoring and BiPAP support as needed. Patient was started on 40 mg daily steroids on 08/31/2018.  Increased to 60 mg every 8 hours on 09/02/2018.  Now on every 12 hours. Continue pulmonary toilet flutter device and incentive spirometry. Scheduled duo nebs  2. UTI recurrent,  UA nitrite negative Urine culture insignificant  growth. Recent urine culture on 08/17/2018 at [facility] grew significant E. coli, resistant to ampicillin and Augmentin. CT renal shows mild left hydronephrosis without stone, diffuse bladder wall thickening and bilateral nonobstructive renal stone. 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 Patient's pain was primarily on the right flank.  Initially being treated with IV cefepime. Based on the prior culture from [facility] transition to [place]. Given her worsening respiratory distress we will transition her back to IV antibiotics. Requires a urology follow-up.  Has seen urology at [facility].  3. AKI on chronic kidney disease stage III Baseline serum creatinine 1.5-1.75 On presentation 2.67 with elevated BUN and hyponatremia. Currently creatinine around 2, normal sodium normal potassium. BUN worsening further. Still not back to baseline.   Strict in and out. Urine output improving.  3 L last 24 hours.  -3 L so far.  On IV Lasix 60 mg twice daily.  Likely will require lesser dose tomorrow.  4. Recurrent nausea and vomiting. Currently resolved. Likely in the setting of infection. CT abdomen scan negative for any acute abnormality. Due to persistent nausea and vomiting repeat CT scan was performed, negative.  Scheduled Reglan ordered.  5.  Acute on chronic diastolic CHF. Essential hypertension Volume overload from aggressive IV resuscitation. Echocardiogram shows preserved EF.  No wall motion or valvular abnormality.  6.  Myelodysplastic syndrome. Pancytopenia. Symptomatic anemia. Received 2 PRBC. H&H relatively stable. No active bleeding reported. Received Granix after discussion with hematology. Platelets are relatively low but no active bleeding. Earlier provider discussed with hematology at [facility] who will see on follow-up with the patient on discharge. Seen by by Dr. Lindi Adie  here in the hospital.   7. Type 2 Diabetes Mellitus, uncontroled with Hyperglycemia, renal complication last hemoglobin A1c was 6.9 this admission hold her home hypoglycemic agents. On insulin sliding scale sensitive with Basal insulin lantus.  8.  Obesity. Body mass index is 28.55 kg/m.   9.  Dysphagia. Speech therapy consulted for concern for aspiration. Patient is having significant aspiration on thin liquids and regular food. Currently n.p.o. except medication. Unable to perform modified barium swallow eval secondary to patient's oxygen requirement. Palliative care consulted for goals of care discussion.  10 .  Hypokalemia. Potassium being replaced with IV. Monitor.  Diet: N.p.o. except ice chips DVT Prophylaxis: SCD, pharmacological prophylaxis contraindicated due to Thrombocytopenia  Advance goals of care discussion: DNR/DNI.  Family meeting tomorrow on 2018/09/15.  Family Communication: no family was present at bedside, at the time of interview.  Updated family on 09/04/2018.  Disposition:  Discharge to be determined.  Consultants: Hematology PCCM Palliative care  Procedures: Echocardiogram   Scheduled Meds: . furosemide  60 mg Intravenous BID  . insulin aspart  0-15 Units Subcutaneous TID WC  . insulin aspart  0-5 Units Subcutaneous QHS  . insulin glargine  22 Units Subcutaneous QHS  . ipratropium-albuterol  3 mL Nebulization Q6H  . methylPREDNISolone (SOLU-MEDROL) injection  40 mg Intravenous Q12H  . metoCLOPramide (REGLAN) injection  5 mg Intravenous TID   Continuous Infusions: . ceFEPime (MAXIPIME) IV Stopped (09/03/18 1726)  . methocarbamol (ROBAXIN) IV    . potassium chloride     PRN Meds: acetaminophen **OR** acetaminophen, bisacodyl, hydrALAZINE, HYDROmorphone (DILAUDID) injection, methocarbamol (ROBAXIN) IV, ondansetron **OR** ondansetron (ZOFRAN) IV, sodium chloride Antibiotics: Anti-infectives (From admission, onward)   Start     Dose/Rate Route Frequency Ordered Stop    09/02/18 1800  vancomycin (VANCOCIN) IVPB 750 mg/150 ml premix  Status:  Discontinued     750 mg 150 mL/hr over 60 Minutes Intravenous Every 48 hours 08/31/18 1653 09/02/18 1234   08/31/18 1700  vancomycin (VANCOCIN) 1,500 mg in sodium chloride 0.9 % 500 mL IVPB     1,500 mg 250 mL/hr over 120 Minutes Intravenous  Once 08/31/18 1653 08/31/18 2008   08/31/18 1700  ceFEPIme (MAXIPIME) 2 g in sodium chloride 0.9 % 100 mL IVPB     2 g 200 mL/hr over 30 Minutes Intravenous Every 24 hours 08/31/18 1653     08/31/18 1000  cefdinir (OMNICEF) capsule 300 mg  Status:  Discontinued     300 mg Oral Daily 08/30/18 0951 08/31/18 1636   08/30/18 1000  cefTRIAXone (ROCEPHIN) 2 g in sodium chloride 0.9 % 100 mL IVPB  Status:  Discontinued     2 g 200 mL/hr over 30 Minutes Intravenous Every 24 hours 08/30/18 0925 08/30/18 0946   08/30/18 1000  cefdinir (OMNICEF) capsule 300 mg  Status:  Discontinued     300 mg Oral Every 12 hours 08/30/18 0946 08/30/18 0950   08/29/18 1000  cefdinir (OMNICEF) capsule 300 mg  Status:  Discontinued     300 mg Oral Daily 08/29/18 0749 08/30/18 0925   08/08/2018 1900  ceFEPIme (MAXIPIME) 1 g in sodium chloride 0.9 % 100 mL IVPB  Status:  Discontinued     1 g 200 mL/hr over 30 Minutes Intravenous Every 24 hours 08/08/2018 1825 08/29/18 0749       Objective: Physical Exam: Vitals:   09/04/18 0803 09/04/18 1200 09/04/18 1332 09/04/18 1344  BP:   (!) 160/62 (!) 160/62  Pulse: 89  95 95  Resp: (!) 22  13 20   Temp:   (!) 97.3 F (36.3 C)   TempSrc:      SpO2: 91% 96% 97% 95%  Weight:      Height:        Intake/Output Summary (Last 24 hours) at 09/04/2018 1628 Last data filed at 09/04/2018 1500 Gross per 24 hour  Intake 697.43 ml  Output 3150 ml  Net -2452.57 ml   Filed Weights   09/02/18 0426 09/03/18 0340 09/04/18 0448  Weight: 79.4 kg 77.6 kg 73.1 kg   General: alert and oriented to time, place, and person. Appear in moderate distress, affect appropriate Eyes:  PERRL, Conjunctiva normal ENT: Oral Mucosa Clear, dry  Neck: no JVD, no Abnormal Mass Or lumps Cardiovascular: S1 and S2 Present, no Murmur, peripheral pulses symmetrical Respiratory: increased respiratory effort, Bilateral Air entry equal and Decreased, no use of accessory muscle, bilateral Crackles, no wheezes Abdomen: Bowel Sound present, Soft and no tenderness, no hernia Skin: no rashes  Extremities: bilateral Pedal edema, no calf tenderness Neurologic: normal without focal findings, mental status, speech normal, alert and oriented x3, PERLA, Motor strength 5/5 and symmetric and sensation grossly normal to light touch Gait not checked due to patient safety concerns  Data Reviewed: CBC: Recent Labs  Lab 08/29/18 0225 08/30/18 0356 08/31/18 0645 09/01/18 0227 09/02/18 0440 09/03/18 0526 09/04/18 0359  WBC 2.9* 2.2* 3.5* 2.5* 1.8* 1.5* 1.4*  NEUTROABS 1.8 1.0*  --   --   --   --   --   HGB 8.8* 8.6* 8.2* 8.2* 7.7* 8.0* 7.6*  HCT 26.0* 25.1* 24.0* 23.5* 22.6* 22.5* 21.5*  MCV 89.7 90.3 89.6 89.7 90.0 91.1 86.3  PLT 22* 21* 20* 16* 16* 32* 12*   Basic Metabolic Panel: Recent Labs  Lab 09/01/18 0227 09/02/18 0440 09/03/18 0526 09/04/18 0359 09/04/18 1125  NA 134* 134* 135 138 139  K 4.0 3.9 4.0 2.9* 3.1*  CL 101 101 100 95* 96*  CO2 20* 22 23 27 28   GLUCOSE 151* 123* 169* 192* 217*  BUN 40* 59* 68* 85* 86*  CREATININE 2.37* 2.33* 2.15* 2.16* 2.03*  CALCIUM 8.4* 8.5* 8.5* 8.3* 8.4*  MG 1.9 2.1 2.2 2.1 2.2    Liver Function Tests: Recent Labs  Lab 08/31/18 0645 09/01/18 0227 09/02/18 0440 09/03/18 0526 09/04/18 0359  AST 21 24 24 31 25   ALT 11 13 15 15 15   ALKPHOS 45 43 42 41 41  BILITOT 1.1 0.7 0.8 1.8* 0.9  PROT 6.5 6.6 5.9* 6.0* 6.6  ALBUMIN 2.7* 2.5* 2.4* 2.2* 2.3*   No results for input(s): LIPASE, AMYLASE in the last 168 hours. No results for input(s): AMMONIA in the last 168 hours. Coagulation Profile: No results for input(s): INR, PROTIME in the  last 168 hours. Cardiac Enzymes: No results for input(s): CKTOTAL, CKMB, CKMBINDEX, TROPONINI in the last 168 hours. BNP (last 3 results) No results for input(s): PROBNP in the last 8760 hours. CBG: Recent Labs  Lab 09/03/18 1153 09/03/18 1625 09/03/18 2143 09/04/18 0718 09/04/18 1132  GLUCAP 200* 216* 193* 190* 200*   Studies: Dg Abd Portable 1v  Result Date: 09/04/2018 CLINICAL DATA:  78 year old female with constipation and abdominal pain for 4 days. EXAM: PORTABLE ABDOMEN - 1 VIEW COMPARISON:  CT Abdomen and Pelvis 08/29/2018. FINDINGS: Portable AP supine view at 1205 hours. Retained CT type oral contrast redemonstrated throughout nondilated large bowel. The entire right abdomen is not included. Non obstructed bowel gas pattern. Stable lung  bases. No acute osseous abnormality identified. IMPRESSION: Continued normal bowel gas pattern and retained CT contrast in the large bowel. Electronically Signed   By: Genevie Ann M.D.   On: 09/04/2018 12:37     Time spent: 35 minutes  Author: Berle Mull, MD [facility] 09/04/2018 4:28 PM  To reach On-call, see care teams to locate the attending and reach out to them via [email or web address]. If 7PM-7AM, please contact night-coverage If you still have difficulty reaching the attending provider, please page the [facility] (Director on Call) for [facility] on amion for assistance.

## 2018-09-04 NOTE — Plan of Care (Signed)
  Problem: Clinical Measurements: Goal: Cardiovascular complication will be avoided Outcome: Progressing Note: NSR on telemetry.  No s/s of cardiovascular complications noted.   Problem: Coping: Goal: Level of anxiety will decrease Outcome: Progressing Note: No s/s of anxiety noted.

## 2018-09-04 NOTE — Progress Notes (Signed)
Pharmacy Antibiotic Note  Ruth Douglas is a 78 y.o. female admitted on 08/22/2018 with urinary frequency and nausea.  Patient was on cefepime and then narrowed to Cefdinir.  Now with respiratory distress and patient continues on cefepime  Scr stable, neutropenic, afebrile Day # 5 of therapy  Plan: Continue Cefepime 2 grams iv Q 24 Monitor renal fxn, clinical progress, vanc AUC  Height: 5\' 3"  (160 cm) Weight: 161 lb 2.5 oz (73.1 kg) IBW/kg (Calculated) : 52.4  Temp (24hrs), Avg:97.8 F (36.6 C), Min:97.3 F (36.3 C), Max:98 F (36.7 C)  Recent Labs  Lab 08/30/18 0356 08/31/18 0645 09/01/18 0227 09/02/18 0440 09/03/18 0526 09/04/18 0359  WBC 2.2* 3.5* 2.5* 1.8* 1.5* 1.4*  CREATININE 2.39* 2.47* 2.37* 2.33* 2.15* 2.16*  LATICACIDVEN 1.1  --   --   --   --   --     Estimated Creatinine Clearance: 21.6 mL/min (A) (by C-G formula based on SCr of 2.16 mg/dL (H)).    Allergies  Allergen Reactions  . Phenergan [Promethazine Hcl] Other (See Comments)    "causes me to be out of my head"      6/28 BCx - NGTD 6/28 COVID: neg 6/29 Ucx: <10 k insignificant growth  Thank you Anette Guarneri, PharmD 09/04/2018, 8:39 AM

## 2018-09-05 ENCOUNTER — Inpatient Hospital Stay (HOSPITAL_COMMUNITY): Payer: Medicare HMO

## 2018-09-05 DIAGNOSIS — Z515 Encounter for palliative care: Secondary | ICD-10-CM

## 2018-09-05 LAB — CBC WITH DIFFERENTIAL/PLATELET
Abs Immature Granulocytes: 0.15 10*3/uL — ABNORMAL HIGH (ref 0.00–0.07)
Basophils Absolute: 0 10*3/uL (ref 0.0–0.1)
Basophils Relative: 2 %
Eosinophils Absolute: 0 10*3/uL (ref 0.0–0.5)
Eosinophils Relative: 0 %
HCT: 22.3 % — ABNORMAL LOW (ref 36.0–46.0)
Hemoglobin: 7.5 g/dL — ABNORMAL LOW (ref 12.0–15.0)
Immature Granulocytes: 11 %
Lymphocytes Relative: 24 %
Lymphs Abs: 0.3 10*3/uL — ABNORMAL LOW (ref 0.7–4.0)
MCH: 30.2 pg (ref 26.0–34.0)
MCHC: 33.6 g/dL (ref 30.0–36.0)
MCV: 89.9 fL (ref 80.0–100.0)
Monocytes Absolute: 0.1 10*3/uL (ref 0.1–1.0)
Monocytes Relative: 7 %
Neutro Abs: 0.8 10*3/uL — ABNORMAL LOW (ref 1.7–7.7)
Neutrophils Relative %: 56 %
Platelets: 11 10*3/uL — CL (ref 150–400)
RBC: 2.48 MIL/uL — ABNORMAL LOW (ref 3.87–5.11)
RDW: 14.5 % (ref 11.5–15.5)
WBC: 1.4 10*3/uL — CL (ref 4.0–10.5)
nRBC: 1.5 % — ABNORMAL HIGH (ref 0.0–0.2)

## 2018-09-05 LAB — COMPREHENSIVE METABOLIC PANEL
ALT: 16 U/L (ref 0–44)
AST: 23 U/L (ref 15–41)
Albumin: 2.6 g/dL — ABNORMAL LOW (ref 3.5–5.0)
Alkaline Phosphatase: 40 U/L (ref 38–126)
Anion gap: 14 (ref 5–15)
BUN: 93 mg/dL — ABNORMAL HIGH (ref 8–23)
CO2: 30 mmol/L (ref 22–32)
Calcium: 8.7 mg/dL — ABNORMAL LOW (ref 8.9–10.3)
Chloride: 97 mmol/L — ABNORMAL LOW (ref 98–111)
Creatinine, Ser: 2.01 mg/dL — ABNORMAL HIGH (ref 0.44–1.00)
GFR calc Af Amer: 27 mL/min — ABNORMAL LOW (ref >60)
GFR calc non Af Amer: 24 mL/min — ABNORMAL LOW (ref >60)
Glucose, Bld: 309 mg/dL — ABNORMAL HIGH (ref 70–99)
Potassium: 3.6 mmol/L (ref 3.5–5.1)
Sodium: 141 mmol/L (ref 135–145)
Total Bilirubin: 1 mg/dL (ref 0.3–1.2)
Total Protein: 6.6 g/dL (ref 6.5–8.1)

## 2018-09-05 LAB — GLUCOSE, CAPILLARY: Glucose-Capillary: 318 mg/dL — ABNORMAL HIGH (ref 70–99)

## 2018-09-05 LAB — MAGNESIUM: Magnesium: 2.2 mg/dL (ref 1.7–2.4)

## 2018-09-05 LAB — PATHOLOGIST SMEAR REVIEW

## 2018-09-05 MED ORDER — ONDANSETRON HCL 4 MG/2ML IJ SOLN: 4.0000 mg | Freq: Four times a day (QID) | INTRAMUSCULAR | Status: DC | PRN

## 2018-09-05 MED ORDER — ONDANSETRON 4 MG PO TBDP
4.0000 mg | ORAL_TABLET | Freq: Four times a day (QID) | ORAL | Status: DC | PRN
  Filled 2018-09-05: qty 1

## 2018-09-05 MED ORDER — HALOPERIDOL LACTATE 2 MG/ML PO CONC
0.5000 mg | ORAL | Status: DC | PRN
  Filled 2018-09-05: qty 0.3

## 2018-09-05 MED ORDER — SODIUM CHLORIDE 0.9 % IV SOLN
0.5000 mg/h | INTRAVENOUS | Status: DC
  Administered 2018-09-05: 0.5 mg/h via INTRAVENOUS
  Filled 2018-09-05: qty 5

## 2018-09-05 MED ORDER — METHOCARBAMOL 1000 MG/10ML IJ SOLN
500.0000 mg | Freq: Three times a day (TID) | INTRAVENOUS | Status: DC
  Filled 2018-09-05 (×3): qty 5

## 2018-09-05 MED ORDER — LORAZEPAM 2 MG/ML PO CONC: 1.0000 mg | ORAL | Status: DC

## 2018-09-05 MED ORDER — GLYCOPYRROLATE 0.2 MG/ML IJ SOLN: 0.2000 mg | INTRAMUSCULAR | Status: DC | PRN

## 2018-09-05 MED ORDER — HYDROMORPHONE BOLUS VIA INFUSION
1.0000 mg | INTRAVENOUS | Status: DC | PRN
  Administered 2018-09-05 (×3): 1 mg via INTRAVENOUS
  Filled 2018-09-05: qty 1

## 2018-09-05 MED ORDER — LORAZEPAM 2 MG/ML IJ SOLN
1.0000 mg | INTRAMUSCULAR | Status: DC
  Administered 2018-09-05 (×2): 1 mg via INTRAVENOUS
  Filled 2018-09-05 (×2): qty 1

## 2018-09-05 MED ORDER — HYDROMORPHONE HCL 1 MG/ML IJ SOLN: 0.5000 mg | INTRAMUSCULAR | Status: DC | PRN

## 2018-09-05 MED ORDER — LORAZEPAM 1 MG PO TABS: 1.0000 mg | ORAL_TABLET | ORAL | Status: DC

## 2018-09-05 MED ORDER — HALOPERIDOL 0.5 MG PO TABS
0.5000 mg | ORAL_TABLET | ORAL | Status: DC | PRN
  Filled 2018-09-05: qty 1

## 2018-09-05 MED ORDER — GLYCOPYRROLATE 1 MG PO TABS: 1.0000 mg | ORAL_TABLET | ORAL | Status: DC | PRN

## 2018-09-05 MED ORDER — HALOPERIDOL LACTATE 5 MG/ML IJ SOLN: 0.5000 mg | INTRAMUSCULAR | Status: DC | PRN

## 2018-09-05 MED ORDER — LORAZEPAM 2 MG/ML IJ SOLN
0.5000 mg | INTRAMUSCULAR | Status: DC | PRN
  Administered 2018-09-05: 0.5 mg via INTRAVENOUS
  Filled 2018-09-05: qty 1

## 2018-09-29 NOTE — Progress Notes (Addendum)
Patient expired at [place]. Confirmed by this RN and Albertina Senegal, RN. Patient's son and daughter-in-law at bedside at this time. Dr. Benny Lennert was notified via text page at 1850.  Remainder of IV dilaudid drip wasted in sink with Albertina Senegal, RN.

## 2018-09-29 NOTE — Death Summary Note (Signed)
Physician Discharge Summary  Ruth Douglas KCL:[id number] DOB: 1940-11-30 DOA: 09-12-18  PCP: Majel Homer, PA  Admit date: 12-Sep-2018 Date of Death: 2018-09-22 Discharge Diagnoses: Principal diagnosis is #1 1. Acute on chronic respiratory failure 2. Bilateral aspiration pneumonia 3. Dysphagia 4. Acute on chronic CHF 5. Myelodysplasia 6. ARDS 7. TRALI 8. Pancytopenia   History of present illness:   Ruth Douglas  is a 78 y.o. female, with a history of diabetes mellitus type 2 on insulin, chronic kidney disease stage III with baseline creatinine of 1.2-1.4 bilateral amputation of toes, left ureteral strictures with recurrent UTIs, lower extremity DVT (no longer on anticoagulation) and hypertension who was hospitalized 5 weeks back at [facility] with sepsis due to pyelonephritis and was found to be pancytopenic on admission.  She was evaluated by hematology.  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  As per the hematologist note FISH detected presence of both del (5 q.) and try (8).  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  Patient was recommended to initiate treatment with HMA but patient was hesitant and wanted to discuss with her son.  This is based on the hematologist Dr. Tamela Gammon evaluation in the office 6/8.  Patient wished to have her care in [place] where she lives. She presented to the ED today with complaints of dry heaving, right upper quadrant pain for past 2 days.  She also reports dysuria and abdominal bloating.  Dysuria dysuria has been off and on since she was hospitalized 5 weeks back for E. coli UTI which was mostly sensitive.  Patient denies any fevers, headache, blurred vision, dizziness, chills, vomiting, palpitations, chest pain, shortness of breath, cough, diarrhea, muscle aches or joint pain.  She reports weakness and poor appetite.  Denies any weight  loss.  Denies any sick contact or recent travel except for medical visit  Pt. with PMH of type II DM, CKD 3, recurrent UTI, HTN, myelodysplastic syndrome; admitted on 09-12-2018, presented with complaint of abdominal fullness, dysuria, nausea, right upper quadrant pain, was found to have AKI, symptomatic anemia, bilateral lower lobe pneumonia, pyelonephritis.  SARS-CoV-2 negative.   Hospital Course:  CXR demonstrated bilateral lower lobe infiltrates. The patient was admitted to a medical bed. She was treated with IV Cefepime, steroids and nebulizer treatments.. As aspiration during emesis was strongly suspected, SLP was consulted. They found the patient to have clinical signs that were very concerning for aspiration. The patient was made NPO, and plans were made for the pateint to undergo MBS, however she never achieved stability of her respiratory status required for this study. Patient was now getting more hypoxic secondary to volume overload and pneumonia. As the patient was not improving, and due to her multiple comorbidities and advanced age, Palliative care was consulted. Yesterday they decided to make the pateint comfort care only. The patient was pronounced dead at [place] on September 22, 2018.  Today's assessment: S: Pt was moribund. Not communicative. O: Vitals:  Vitals:   Sep 22, 2018 0719 09/22/18 1403  BP: (!) 157/66   Pulse: 92 96  Resp: 17 16  Temp:    SpO2: 90%     Constitutional:  Appears calm and comfortable. No acute distress. Respiratory:   No increased work of breathing.  Positive for rales, no rhonchi, or wheezes are auscultated. Cardiovascular:   RRR, no m/r/g  No LE extremity edema    Normal pedal pulses Abdomen:  Abdomen appears normal; no tenderness or masses  No hernias  No HSM Musculoskeletal:   No cyanosis, clubbing, or edema. Skin:   No rashes, lesions, ulcers  palpation of skin: no induration or nodules Neurologic:    Psychiatric:  Unable to  evaluate due to the patient's severe illness.    The results of significant diagnostics from this hospitalization (including imaging, microbiology, ancillary and laboratory) are listed below for reference.    Significant Diagnostic Studies: Ct Abdomen Pelvis Wo Contrast  Result Date: 08/16/2018 CLINICAL DATA:  78 y/o  F; abdominal pain. EXAM: CT ABDOMEN AND PELVIS WITHOUT CONTRAST TECHNIQUE: Multidetector CT imaging of the abdomen and pelvis was performed following the standard protocol without IV contrast. COMPARISON:  07/08/2018 and 06/10/2016 CT of abdomen and pelvis. FINDINGS: Lower chest: Small right pleural effusion. Partially visualized consolidations within the right middle lobe, right lower lobe, and left medial lower lobe. New oblong 10 mm nodule in left lung base, probably mucoid impaction of an airway. Hepatobiliary: No focal liver abnormality is seen. No gallstones, gallbladder wall thickening, or biliary dilatation. Pancreas: Unremarkable. No pancreatic ductal dilatation or surrounding inflammatory changes. Spleen: Normal in size without focal abnormality. Adrenals/Urinary Tract: Normal adrenal glands. Multiple hypodense foci within the kidneys bilaterally with cyst on prior studies, suboptimal assessment without contrast. Bilateral punctate nonobstructive nephrolithiasis. No right hydronephrosis. Mild left hydronephrosis without obstructing stone or mass. Mild diffuse bladder wall thickening. Stomach/Bowel: Stomach is within normal limits. Appendix appears normal. No evidence of bowel wall thickening, distention, or inflammatory changes. Contrast extends to the descending colon. Vascular/Lymphatic: Aortic atherosclerosis. No enlarged abdominal or pelvic lymph nodes. Reproductive: Status post hysterectomy. No adnexal masses. Other: Small paraumbilical hernia containing fat. No abdominopelvic ascites. Musculoskeletal: No fracture is seen. IMPRESSION: 1. Mild left hydronephrosis without  obstructing stone or mass identified. Findings may represent infection of the collecting system or possibly a recently passed stone. 2. Mild diffuse bladder wall thickening compatible with cystitis. 3. Small right pleural effusion partially visualized consolidations greater in the left lung, suspected pneumonia. 4. Bilateral nonobstructive nephrolithiasis. Electronically Signed   By: Kristine Garbe M.D.   On: 07/30/2018 23:34   Dg Chest 2 View  Result Date: 08/31/2018 CLINICAL DATA:  Shortness of breath EXAM: CHEST - 2 VIEW COMPARISON:  08/30/2018 FINDINGS: Worsening asymmetric upper lobe patchy airspace process concerning for bilateral pneumonia. Minor improvement in the lower lobe aeration. Heart is enlarged. No definite CHF pattern. No pneumothorax. Small posterior layering effusions on lateral view. Trachea midline. Aorta atherosclerotic and degenerative changes of the spine. IMPRESSION: Worsening bilateral upper lobe airspace process compatible with bilateral pneumonia. Electronically Signed   By: Jerilynn Mages.  Shick M.D.   On: 08/31/2018 14:15   Ct Chest Wo Contrast  Result Date: 09/03/2018 CLINICAL DATA:  Pulmonary edema. EXAM: CT CHEST WITHOUT CONTRAST TECHNIQUE: Multidetector CT imaging of the chest was performed following the standard protocol without IV contrast. COMPARISON:  Chest x-ray 09/02/2018 and 08/31/2018 FINDINGS: Cardiovascular: The heart size is normal. Dense atherosclerotic calcifications are present in the coronary arteries. There is no significant effusion. Atherosclerotic changes are noted at the aortic arch. There is no aneurysm. Mediastinum/Nodes: Right paratracheal lymph node measures up to 9 mm. A precarinal node or nodal mass is 11 mm in short axis. Subcarinal adenopathy is present. Lungs/Pleura: Extensive patchy airspace consolidation favors the upper lobes. There is diffuse ground-glass attenuation as well. Although there is some involvement of the lower lobes, the lung bases  are spared. There are no significant effusions.  There is no pneumothorax. Upper Abdomen: Visualized abdomen is unremarkable Musculoskeletal: Vertebral body heights and alignment are maintained. No focal lytic or blastic lesions are present. The ribs are unremarkable. Sternum is normal. IMPRESSION: 1. Extensive patchy bilateral airspace opacities favoring the upper lobes. Findings are consistent with multi lobular pneumonia some component of edema may be present. The consolidated segments are not consistent with edema. The pattern is atypical for the novel coronavirus and more likely represents bacterial multifocal pneumonia. 2. Precarinal and subcarinal adenopathy is likely reactive. 3.  Aortic Atherosclerosis (ICD10-I70.0). Electronically Signed   By: San Morelle M.D.   On: 09/03/2018 05:17   Nm Pulmonary Perfusion  Result Date: 08/31/2018 CLINICAL DATA:  Shortness of breath, pneumonia, positive D-dimer EXAM: NUCLEAR MEDICINE PERFUSION LUNG SCAN TECHNIQUE: Perfusion images were obtained in multiple projections after intravenous injection of radiopharmaceutical. Ventilation scans intentionally deferred if perfusion scan and chest x-ray adequate for interpretation during COVID 19 epidemic. RADIOPHARMACEUTICALS:  1.6 mCi Tc-69mMAA IV COMPARISON:  08/31/2018 chest x-ray FINDINGS: Symmetric lung perfusion without peripheral wedge-shaped segmental perfusion defects. IMPRESSION: Normal perfusion lung scan. Electronically Signed   By: MJerilynn Mages  Shick M.D.   On: 08/31/2018 14:26   Dg Chest Port 1 View  Result Date: 09/02/2018 CLINICAL DATA:  Dyspnea EXAM: PORTABLE CHEST 1 VIEW COMPARISON:  08/31/2018 chest radiograph. FINDINGS: Stable cardiomediastinal silhouette with normal heart size. No pneumothorax. No pleural effusion. Worsening severe fluffy patchy opacities throughout both lungs with slight sparing of the lung bases. IMPRESSION: Worsening severe fluffy patchy opacities throughout both lungs with slight  sparing of the lung bases. Differential includes multilobar pneumonia or severe pulmonary edema (potentially noncardiogenic given normal heart size). Electronically Signed   By: JIlona SorrelM.D.   On: 09/02/2018 10:55   Dg Chest Port 1 View  Result Date: 08/30/2018 CLINICAL DATA:  Short of breath EXAM: PORTABLE CHEST 1 VIEW COMPARISON:  08/16/2018 FINDINGS: Progression of diffuse bilateral airspace disease upper and lower lobes. No significant effusion. Heart size within normal limits. IMPRESSION: Progression of diffuse bilateral airspace disease. This may represent pneumonia or edema. Electronically Signed   By: CFranchot GalloM.D.   On: 08/30/2018 09:19   Dg Chest Port 1 View  Result Date: 08/05/2018 CLINICAL DATA:  Nausea and vomiting. EXAM: PORTABLE CHEST 1 VIEW COMPARISON:  March since 2018 FINDINGS: There is no apparent airspace opacity involving the right middle lobe and right lower lobe. The heart size is borderline enlarged. There is no pneumothorax. No large pleural effusion. There is some mild elevation of the right hemidiaphragm. There is no acute osseous abnormality. IMPRESSION: Airspace opacity involving the right middle lobe and right lower lobe concerning for developing pneumonia. Follow-up to radiologic resolution is recommended. Electronically Signed   By: CConstance HolsterM.D.   On: 08/19/2018 18:57   Dg Abd Portable 1v  Result Date: 09/04/2018 CLINICAL DATA:  78year old female with constipation and abdominal pain for 4 days. EXAM: PORTABLE ABDOMEN - 1 VIEW COMPARISON:  CT Abdomen and Pelvis 08/29/2018. FINDINGS: Portable AP supine view at 1205 hours. Retained CT type oral contrast redemonstrated throughout nondilated large bowel. The entire right abdomen is not included. Non obstructed bowel gas pattern. Stable lung bases. No acute osseous abnormality identified. IMPRESSION: Continued normal bowel gas pattern and retained CT contrast in the large bowel. Electronically Signed   By: HGenevie AnnM.D.   On: 09/04/2018 12:37   Ct Renal Stone Study  Result Date: 08/29/2018 CLINICAL DATA:  UTI, abdominal pain EXAM:  CT ABDOMEN AND PELVIS WITHOUT CONTRAST TECHNIQUE: Multidetector CT imaging of the abdomen and pelvis was performed following the standard protocol without IV contrast. COMPARISON:  08/25/2018 FINDINGS: Lower chest: Redemonstrated small right pleural effusion with associated atelectasis or consolidation, slightly increased compared to prior examination. No significant change in dense consolidation of the partially included lateral segment right middle lobe. Extensive, new dense consolidation of the left lung base. Coronary artery calcifications. Hepatobiliary: No solid liver abnormality is seen. No gallstones, gallbladder wall thickening, or biliary dilatation. Pancreas: Unremarkable. No pancreatic ductal dilatation or surrounding inflammatory changes. Spleen: Normal in size without significant abnormality. Adrenals/Urinary Tract: Adrenal glands are unremarkable. Multiple small calcifications in the vicinity of the renal calices bilaterally, which may be small urinary tract calculi or vascular calcifications. Previously seen left-sided hydronephrosis is resolved. Mild thickening of the urinary bladder, unchanged from prior. Stomach/Bowel: Stomach is within normal limits. Appendix appears normal. No evidence of bowel wall thickening, distention, or inflammatory changes. Vascular/Lymphatic: Aortic atherosclerosis. No enlarged abdominal or pelvic lymph nodes. Reproductive: Status post hysterectomy. Other: No abdominal wall hernia or abnormality. Mild anasarca, new compared to prior examination. No abdominopelvic ascites. Musculoskeletal: No acute or significant osseous findings. IMPRESSION: 1. Redemonstrated small right pleural effusion with associated atelectasis or consolidation, slightly increased compared to prior examination. No significant change in dense consolidation of the partially  included lateral segment right middle lobe. Extensive, new dense consolidation of the left lung base. Findings suggest ongoing or worsening infection/aspiration. 2. Multiple small calcifications in the vicinity of the renal calices bilaterally, which may be small urinary tract calculi or vascular calcifications. Previously seen left-sided hydronephrosis is resolved. There is no ureteral calculus. Mild thickening of the urinary bladder, unchanged from prior. 3.  Mild anasarca, new compared to prior examination. 4.  Other chronic and incidental findings as detailed above. Electronically Signed   By: Eddie Candle M.D.   On: 08/29/2018 13:59   Vas [place] Lower Extremity Venous (dvt)  Result Date: 08/31/2018  Lower Venous Study Indications: Hypoxia.  Limitations: Body habitus and patient in upright position due to shortness of breath/hypoxia. Patient unable to tolerate all compression maneuvers. Comparison Study: No prior study. Performing Technologist: Maudry Mayhew MHA, RDMS, RVT, RDCS  Examination Guidelines: A complete evaluation includes B-mode imaging, spectral Doppler, color Doppler, and power Doppler as needed of all accessible portions of each vessel. Bilateral testing is considered an integral part of a complete examination. Limited examinations for reoccurring indications may be performed as noted.  +---------+---------------+---------+-----------+----------+------------------+  RIGHT     Compressibility Phasicity Spontaneity Properties Summary             +---------+---------------+---------+-----------+----------+------------------+  CFV       Full            Yes       Yes                                        +---------+---------------+---------+-----------+----------+------------------+  SFJ       Full                                                                 +---------+---------------+---------+-----------+----------+------------------+  FV Prox   Full                                                                  +---------+---------------+---------+-----------+----------+------------------+  FV Mid                              Yes                    Patent by color                                                                 Doppler             +---------+---------------+---------+-----------+----------+------------------+  FV Distal                           Yes                    Patent by color                                                                 Doppler             +---------+---------------+---------+-----------+----------+------------------+  PFV       Full                                                                 +---------+---------------+---------+-----------+----------+------------------+  POP       Full            Yes       Yes                                        +---------+---------------+---------+-----------+----------+------------------+  PTV                                 Yes                    Patent by color                                                                 Doppler             +---------+---------------+---------+-----------+----------+------------------+  PERO                                Yes                    Patent by color  Doppler             +---------+---------------+---------+-----------+----------+------------------+   +---------+---------------+---------+-----------+----------+------------------+  LEFT      Compressibility Phasicity Spontaneity Properties Summary             +---------+---------------+---------+-----------+----------+------------------+  CFV                       Yes       Yes                    Patent by color                                                                 Doppler             +---------+---------------+---------+-----------+----------+------------------+  SFJ                                                        Not visualized       +---------+---------------+---------+-----------+----------+------------------+  FV Prox                             Yes                    Patent by color                                                                 Doppler             +---------+---------------+---------+-----------+----------+------------------+  FV Mid                              Yes                    Patent by color                                                                 Doppler             +---------+---------------+---------+-----------+----------+------------------+  FV Distal                           Yes                    Patent by color  Doppler             +---------+---------------+---------+-----------+----------+------------------+  POP                       Yes       Yes                    Patent by color                                                                 Doppler             +---------+---------------+---------+-----------+----------+------------------+  PTV                                 Yes                    Patent by color                                                                 Doppler             +---------+---------------+---------+-----------+----------+------------------+  PERO                                Yes                    Patent by color                                                                 Doppler             +---------+---------------+---------+-----------+----------+------------------+    Summary: Right: There is no evidence of deep vein thrombosis in the lower extremity. However, portions of this examination were limited- see technologist comments above. No cystic structure found in the popliteal fossa. Left: There is no evidence of deep vein thrombosis in the lower extremity. However, portions of this examination were limited- see technologist comments above. No cystic structure found in the popliteal  fossa.  *See table(s) above for measurements and observations. Electronically signed by Servando Snare MD on 08/31/2018 at 11:00:19 AM.    Final     Microbiology: Recent Results (from the past 240 hour(s))  Culture, blood (routine x 2)     Status: None   Collection Time: 08/27/2018  8:59 PM   Specimen: BLOOD RIGHT HAND  Result Value Ref Range Status   Specimen Description BLOOD RIGHT HAND  Final   Special Requests   Final    BOTTLES DRAWN AEROBIC ONLY Blood Culture adequate volume   Culture   Final    NO GROWTH 5 DAYS Performed at [facility], [address] [place], [place] [address]    Report Status 08/31/2018 FINAL  Final  Culture, blood (routine x 2)     Status: None   Collection Time: 07/31/2018  8:59 PM   Specimen: BLOOD LEFT HAND  Result Value Ref Range Status   Specimen Description BLOOD LEFT HAND  Final   Special Requests   Final    BOTTLES DRAWN AEROBIC ONLY Blood Culture adequate volume   Culture   Final    NO GROWTH 5 DAYS Performed at [facility], [place] [address] [place], [place] [address]    Report Status 08/31/2018 FINAL  Final  Culture, Urine     Status: Abnormal   Collection Time: 08/27/18  8:43 AM   Specimen: Urine, Random  Result Value Ref Range Status   Specimen Description URINE, RANDOM  Final   Special Requests NONE  Final   Culture (A)  Final    <10,000 COLONIES/mL INSIGNIFICANT GROWTH Performed at [place] [address], [place],  [address]    Report Status 08/28/2018 FINAL  Final     Labs: Basic Metabolic Panel: Recent Labs  Lab 09/02/18 0440 09/03/18 0526 09/04/18 0359 09/04/18 1125 25-Sep-2018 0607  NA 134* 135 138 139 141  K 3.9 4.0 2.9* 3.1* 3.6  CL 101 100 95* 96* 97*  CO2 _0 GLUCOSE 123* 169* 192* 217* 309*  BUN 59* 68* 85* 86* 93*  CREATININE 2.33* 2.15* 2.16* 2.03* 2.01*  CALCIUM 8.5* 8.5* 8.3* 8.4* 8.7*  MG 2.1 2.2 2.1 2.2 2.2   Liver Function Tests: Recent Labs  Lab 09/01/18 0227  09/02/18 0440 09/03/18 0526 09/04/18 0359 2018-09-25 0607  AST _1 ALT _2 ALKPHOS 43 42 41 41 40  BILITOT 0.7 0.8 1.8* 0.9 1.0  PROT 6.6 5.9* 6.0* 6.6 6.6  ALBUMIN 2.5* 2.4* 2.2* 2.3* 2.6*   No results for input(s): LIPASE, AMYLASE in the last 168 hours. No results for input(s): AMMONIA in the last 168 hours. CBC: Recent Labs  Lab 08/30/18 0356  09/01/18 0227 09/02/18 0440 09/03/18 0526 09/04/18 0359 09/25/18 0607  WBC 2.2*   < > 2.5* 1.8* 1.5* 1.4* 1.4*  NEUTROABS 1.0*  --   --   --   --   --  0.8*  HGB 8.6*   < > 8.2* 7.7* 8.0* 7.6* 7.5*  HCT 25.1*   < > 23.5* 22.6* 22.5* 21.5* 22.3*  MCV 90.3   < > 89.7 90.0 91.1 86.3 89.9  PLT 21*   < > 16* 16* 32* 12* 11*   < > = values in this interval not displayed.   Cardiac Enzymes: No results for input(s): CKTOTAL, CKMB, CKMBINDEX, TROPONINI in the last 168 hours. BNP: BNP (last 3 results) No results for input(s): BNP in the last 8760 hours.  ProBNP (last 3 results) No results for input(s): PROBNP in the last 8760 hours.  CBG: Recent Labs  Lab 09/04/18 0718 09/04/18 1132 09/04/18 1648 09/04/18 2141 09/25/2018 0737  GLUCAP 190* 200* 274* 241* 318*    Principal Problem:   Pancytopenia (HCC) Active Problems:   DM2 (diabetes mellitus, type 2) (HCC)   Essential hypertension   Myelodysplastic syndrome (HCC)   RUQ pain   Nausea   Dysuria   HCAP (healthcare-associated pneumonia)   SIRS (systemic inflammatory response syndrome) (HCC)   Acute renal failure superimposed on stage 3 chronic kidney disease ([place])   Diabetes  mellitus with hyperglycemia, with long-term current use of insulin ([place])   Palliative care by specialist   Advanced care planning/counseling discussion   Goals of care, counseling/discussion   SOB (shortness of breath)   Terminal care   Time coordinating discharge: 42 minutes.  Signed:        Xylon Croom, DO [facility]  Sep 16, 2018, 7:37 PM

## 2018-09-29 NOTE — Progress Notes (Signed)
Inpatient Diabetes Program Recommendations  AACE/ADA: New Consensus Statement on Inpatient Glycemic Control   Target Ranges:  Prepandial:   less than 140 mg/dL      Peak postprandial:   less than 180 mg/dL (1-2 hours)      Critically ill patients:  140 - 180 mg/dL   Results for EBANY, BOWERMASTER (MRN [id number]) as of October 04, 2018 10:13  Ref. Range 09/04/2018 07:18 09/04/2018 11:32 09/04/2018 16:48 09/04/2018 21:41 04-Oct-2018 07:37  Glucose-Capillary Latest Ref Range: 70 - 99 mg/dL 190 (H) 200 (H) 274 (H) 241 (H) 318 (H)   Review of Glycemic Control  Diabetes history: DM2 Outpatient Diabetes medications: Tresiba 22 units QHS Current orders for Inpatient glycemic control: Lantus 22 units QHS, Novolog 0-15 units TID with meals, Novolog 0-5 units QHS; Solumedrol 40 mg Q12H  Inpatient Diabetes Program Recommendations:   Insulin: If Solumedrol is continued, please consider increasing Lantus to 25 units QHS and ordering Novolog 5 units TID with meals for meal coverage if patient eats at least 50% of meals.  Thanks, Barnie Alderman, RN, MSN, CDE Diabetes Coordinator Inpatient Diabetes Program [phone] (Team Pager from 8am to 5pm)

## 2018-09-29 NOTE — Progress Notes (Signed)
Daily Progress Note   Patient Name: Ruth Douglas       Date: 2018/09/16 DOB: 18-Oct-1940  Age: 78 y.o. MRN#: [id number] Attending Physician: Karie Kirks, DO Primary Care Physician: Majel Homer, [place] Admit Date: 08/25/2018  Reason for Consultation/Follow-up: Establishing goals of care and Terminal Care  Subjective: Met with patient, her son Ruth Douglas, and son Ruth Douglas. Patient with vomiting this morning, feeling very miserable. Telling nursing she just wants to go to heaven.  Discussion had with family. Patient states she feels she is not getting better.  We reviewed complications of her hospitalizations and comorbidities.  Reviewed options of continued aggressive care vs comfort care.  Patient prefers to transition to comfort care and allow for natural death with symptom management. Family is supportive.   Review of Systems  Constitutional: Positive for malaise/fatigue.  Respiratory: Positive for shortness of breath.   Gastrointestinal: Positive for vomiting.    Length of Stay: 10  Current Medications: Scheduled Meds:  . furosemide  60 mg Intravenous BID  . insulin aspart  0-15 Units Subcutaneous TID WC  . insulin aspart  0-5 Units Subcutaneous QHS  . insulin glargine  22 Units Subcutaneous QHS  . ipratropium-albuterol  3 mL Nebulization Q6H  . methylPREDNISolone (SOLU-MEDROL) injection  40 mg Intravenous Q12H  . metoCLOPramide (REGLAN) injection  5 mg Intravenous TID    Continuous Infusions: . ceFEPime (MAXIPIME) IV 2 g (09/04/18 1710)  . methocarbamol (ROBAXIN) IV      PRN Meds: acetaminophen **OR** acetaminophen, bisacodyl, hydrALAZINE, HYDROmorphone (DILAUDID) injection, LORazepam, methocarbamol (ROBAXIN) IV, ondansetron **OR** ondansetron (ZOFRAN) IV, sodium chloride   Physical Exam Vitals signs and nursing note reviewed.  Constitutional:      Appearance: She is ill-appearing.     Comments: lethargic  Cardiovascular:     Rate and Rhythm: Normal rate.  Pulmonary:     Comments: SOB            Vital Signs: BP (!) 157/66   Pulse 92   Temp 97.9 F (36.6 C) (Oral)   Resp 17   Ht '5\' 3"'  (1.6 m)   Wt 69.9 kg   SpO2 90%   BMI 27.30 kg/m  SpO2: SpO2: 90 % O2 Device: O2 Device: High Flow Nasal Cannula O2 Flow Rate: O2 Flow Rate (L/min): 35 L/min  Intake/output summary:  Intake/Output Summary (Last 24 hours) at 09/20/18 1143 Last data filed at 09/20/18 0432 Gross per 24 hour  Intake 1253.79 ml  Output 3000 ml  Net -1746.21 ml   LBM: Last BM Date: (about 4 days ago per patient, but does not recall) Baseline Weight: Weight: 75.5 kg Most recent weight: Weight: 69.9 kg       Palliative Assessment/Data: PPS: 10%      Patient Active Problem List   Diagnosis Date Noted  . SOB (shortness of breath)   . Palliative care by specialist   . Advanced care planning/counseling discussion   . Goals of care, counseling/discussion   . HCAP (healthcare-associated pneumonia) 08/28/2018  . SIRS (systemic inflammatory response syndrome) ([place]) 08/28/2018  . Acute renal failure superimposed on stage 3 chronic kidney disease ([place]) 08/28/2018  . Diabetes mellitus with hyperglycemia, with long-term current use of insulin ([place]) 08/28/2018  . Pancytopenia ([place]) 08/28/2018  . 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 RUQ pain 08/01/2018  . Nausea 08/22/2018  . Dysuria 08/27/2018  . Nephrolithiasis 08/08/2014  . Essential hypertension 06/19/2014  . Anemia 06/18/2014  . CKD (chronic kidney disease) 05/31/2012  . DVT (deep venous thrombosis) ([place]) 05/31/2012  . Hyperlipidemia 05/31/2012  . DM2 (diabetes mellitus, type 2) ([place]) 01/15/2012  . 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 She is pancytopenic with WBC at 1.5 (s/p Granix), Hgb 8.0, and Plts 32. Admission has been complicated by renal decompensation and volume overload. Palliative medicine consulted for [place].  Assessment/Recommendations/Plan   Transition to comfort measures only  Allow for comfort feeding  Hydromorphone infusion .59m/hr with 136mbolus q30106mprn for SOB, air hunger  Lorazepam q4hr scheduled  Robaxin IV q8hr for muscle spasms  Robinul .2mg22mhr prn IV  Ondansetron 4mg 24mr prn for nausea/vomiting   Haldol .5mg q62m prn for terminal delirium  Continue hi flow O2 while patient is awake and alert, when she is no longer awake and alert, titrate to room air  Stop IV fluids, antibiotics, labs, and other medications that are not intended for comfort  Allow family to visit due to patient at end of life  Anticipate hospital death   Goals of Care and Additional Recommendations:  Limitations on Scope of Treatment: Full Comfort Care  Code Status:  DNR  Prognosis:   Hours - Days  Discharge Planning:  Anticipated Hospital Death  Care plan was discussed with patient and famiy.   Thank you for allowing the Palliative Medicine Team to assist in the care of this patient.   Time In: 1015 Time Out: 1145 Total Time 90 minutes Prolonged Time Billed yes      Greater than 50%  of this time was spent counseling and coordinating care related to the above assessment and plan.   Mariana Kaufman-C Palliative Medicine   Please contact Palliative Medicine Team phone at [phone] and concerns.

## 2018-09-29 NOTE — Progress Notes (Signed)
Patient cleaned up and placed in a body bag with a toe tag, all phone calls made and death certificate signed, security called to get the morgue cart then patient to go down to morgue.

## 2018-09-29 NOTE — Progress Notes (Signed)
SLP Cancellation Note  Patient Details Name: Ruth Douglas MRN: [id number] DOB: 06-21-1940   Cancelled treatment:       Reason Eval/Treat Not Completed: Medical issues which prohibited therapy. Pt continues to have high O2 needs that limit her ability to transport for MBS. Attempted to see pt to determine if there are any clinical changes, but per RN, pt has been vomiting this morning. POs were held for now. Note that [place] meeting is planned for later this morning.   Venita Sheffield Aron Needles 09/27/18, 8:45 AM  Pollyann Glen, M.A. [place] Acute Environmental education officer [phone] Office [phone]

## 2018-09-29 DEATH — deceased
